# Patient Record
Sex: Female | Born: 1960
Health system: Southern US, Community
[De-identification: ages and names within clinical notes are randomized; demographics above are authoritative.]

## PROBLEM LIST (undated history)

## (undated) DIAGNOSIS — Z9889 Other specified postprocedural states: Secondary | ICD-10-CM

## (undated) DIAGNOSIS — F419 Anxiety disorder, unspecified: Secondary | ICD-10-CM

## (undated) DIAGNOSIS — T8859XA Other complications of anesthesia, initial encounter: Secondary | ICD-10-CM

## (undated) DIAGNOSIS — K589 Irritable bowel syndrome without diarrhea: Secondary | ICD-10-CM

## (undated) DIAGNOSIS — I2510 Atherosclerotic heart disease of native coronary artery without angina pectoris: Secondary | ICD-10-CM

## (undated) DIAGNOSIS — Z87442 Personal history of urinary calculi: Secondary | ICD-10-CM

## (undated) DIAGNOSIS — G4730 Sleep apnea, unspecified: Secondary | ICD-10-CM

## (undated) DIAGNOSIS — F329 Major depressive disorder, single episode, unspecified: Secondary | ICD-10-CM

## (undated) DIAGNOSIS — F32A Depression, unspecified: Secondary | ICD-10-CM

## (undated) DIAGNOSIS — I10 Essential (primary) hypertension: Secondary | ICD-10-CM

## (undated) DIAGNOSIS — K219 Gastro-esophageal reflux disease without esophagitis: Secondary | ICD-10-CM

## (undated) DIAGNOSIS — J189 Pneumonia, unspecified organism: Secondary | ICD-10-CM

## (undated) DIAGNOSIS — M1990 Unspecified osteoarthritis, unspecified site: Secondary | ICD-10-CM

## (undated) DIAGNOSIS — M797 Fibromyalgia: Secondary | ICD-10-CM

## (undated) DIAGNOSIS — E039 Hypothyroidism, unspecified: Secondary | ICD-10-CM

## (undated) DIAGNOSIS — K5792 Diverticulitis of intestine, part unspecified, without perforation or abscess without bleeding: Secondary | ICD-10-CM

## (undated) DIAGNOSIS — E079 Disorder of thyroid, unspecified: Secondary | ICD-10-CM

## (undated) DIAGNOSIS — R112 Nausea with vomiting, unspecified: Secondary | ICD-10-CM

## (undated) DIAGNOSIS — J45909 Unspecified asthma, uncomplicated: Secondary | ICD-10-CM

## (undated) DIAGNOSIS — E119 Type 2 diabetes mellitus without complications: Secondary | ICD-10-CM

## (undated) HISTORY — PX: KNEE SURGERY: SHX244

## (undated) HISTORY — PX: CYST REMOVAL HAND: SHX6279

## (undated) HISTORY — PX: CHOLECYSTECTOMY: SHX55

## (undated) HISTORY — PX: BREAST EXCISIONAL BIOPSY: SUR124

## (undated) HISTORY — PX: TUBAL LIGATION: SHX77

## (undated) HISTORY — PX: FRACTURE SURGERY: SHX138

## (undated) HISTORY — PX: CARPAL TUNNEL RELEASE: SHX101

---

## 1998-08-21 ENCOUNTER — Ambulatory Visit (HOSPITAL_BASED_OUTPATIENT_CLINIC_OR_DEPARTMENT_OTHER): Admission: RE | Admit: 1998-08-21 | Discharge: 1998-08-21 | Payer: Self-pay | Admitting: Orthopedic Surgery

## 1998-09-18 ENCOUNTER — Ambulatory Visit (HOSPITAL_BASED_OUTPATIENT_CLINIC_OR_DEPARTMENT_OTHER): Admission: RE | Admit: 1998-09-18 | Discharge: 1998-09-18 | Payer: Self-pay | Admitting: Orthopedic Surgery

## 2008-03-16 ENCOUNTER — Encounter: Admission: RE | Admit: 2008-03-16 | Discharge: 2008-03-16 | Payer: Self-pay | Admitting: Orthopedic Surgery

## 2008-04-16 ENCOUNTER — Ambulatory Visit (HOSPITAL_BASED_OUTPATIENT_CLINIC_OR_DEPARTMENT_OTHER): Admission: RE | Admit: 2008-04-16 | Discharge: 2008-04-16 | Payer: Self-pay | Admitting: Orthopedic Surgery

## 2008-04-16 ENCOUNTER — Encounter (INDEPENDENT_AMBULATORY_CARE_PROVIDER_SITE_OTHER): Payer: Self-pay | Admitting: Orthopedic Surgery

## 2010-03-17 ENCOUNTER — Ambulatory Visit (HOSPITAL_COMMUNITY): Admission: RE | Admit: 2010-03-17 | Discharge: 2010-03-17 | Payer: Self-pay | Admitting: Family Medicine

## 2010-10-20 ENCOUNTER — Ambulatory Visit (HOSPITAL_COMMUNITY)
Admission: RE | Admit: 2010-10-20 | Discharge: 2010-10-20 | Payer: Self-pay | Source: Home / Self Care | Attending: Family Medicine | Admitting: Family Medicine

## 2011-03-29 NOTE — Op Note (Signed)
[name], [name]               ACCOUNT NO.:  [id number]   MEDICAL RECORD NO.:  [id number]          PATIENT TYPE:  AMB   LOCATION:  [facility]                         FACILITY:  [facility]   PHYSICIAN:  Madelynn Done, MD  DATE OF BIRTH:  12-Jul-1961   DATE OF PROCEDURE:  DATE OF DISCHARGE:                               OPERATIVE REPORT   PREOPERATIVE DIAGNOSES:  1. Left wrist first dorsal compartment tenosynovitis.  2. Left dorsal wrist mass.   POSTOPERATIVE DIAGNOSES:  1. Left wrist first dorsal compartment tenosynovitis.  2. Left dorsal wrist mass.   ATTENDING SURGEON:  Sharma Covert IV, MD, who was scrubbed and present  for the entire procedure.   ASSISTANT SURGEON:  None.   PROCEDURE:  1. Left dorsal wrist mass, deep mass removal.  2. Left wrist first dorsal compartment tenosynovectomy.   ANESTHESIA:  General via LMA.   TOURNIQUET TIME:  Less than 12 minutes at 250 mmHg.   INTRAOPERATIVE FINDINGS:  The patient did have acute tenosynovitis  within the first dorsal compartment, and multiple slips of the APL and a  separate compartment for the EPB.   The patient also did have a dorsal mass consistent with an  intratendinous ganglion out to the level of the first dorsal  compartment.   SURGICAL INDICATIONS:  Kristin Bailey is a 50 year old female who had  persistent radial-sided left wrist pain.  The patient had a mass,  persistent pain, and has elected to proceed with the above procedure.  Risks, benefits and alternatives were discussed in detail with the  patient.  A signed informed consent was obtained on the day of  procedure.  Risks include, but not limited to bleeding, infection, nerve  damage, partial or permanent persistent symptoms, subluxation with  tendinis damage to the nearby nerves, arteries or tendons, recurrence of  the mass and need for further surgical intervention.   DESCRIPTION OF PROCEDURE:  The patient was properly identified in the  preoperative  holding area and marked with a permanent mark made on the  left wrist.  The patient then brought back to the operating room, placed  supine on the anesthesia table.  General anesthesia was administered via  LMA.  Please note, Dr. Lequita Halt was also performing a knee scope at the  concurrent time of my procedure.  A separate operative note will be  dictated by him.   A well-padded tourniquet was then placed on the left forearm and sealed  with a 1000 drape.  The left upper extremity was then prepped with  DuraPrep and then sterilely draped.  A time-out was then called.  The  correct time was identified and the procedure was then begun.  The limb  was then elevated using Esmarch exsanguination and the tourniquet  insufflated to 250 mmHg.  A longitudinal incision was then made directly  in line with the first dorsal compartment.  Dissection was carried down  through the skin and subcutaneous tissues only.  The branches of the  superficial branch radial nerve were then identified and retracted  separately throughout the entire case.  The dorsal  mass directly over  the first dorsal compartment was then isolated and consistent with a  ganglion within the tendon sheath.  The mass was then excised sharply.  The first dorsal compartment was then released both proximally and  distally under direct visualization.  A tenosynovectomy was then carried  out of the EPB and a separate compartment and the multiple slip of the  APL.  Following the tenosynovectomy the area was then thoroughly  irrigated.  The skin was then closed using horizontal mattress of 4-0  nylon sutures.  Adaptic dressing and a sterile compressive bandage was  then applied.  The patient was then placed in a well-padded thumb spica  dressing.  The patient tolerated the procedure well.  She was extubated  following her knee scope procedure.   POSTOPERATIVE PLAN:  The patient will be discharged to home.  She will  be seen back in the  office in 10 days for wound check and suture  removal, and then begin a postoperative rehabilitation 3-course.      Madelynn Done, MD  Electronically Signed     FWO/MEDQ  D:  04/16/2008  T:  04/16/2008  Job:  [id number]

## 2011-03-29 NOTE — Op Note (Signed)
[name], [name]               ACCOUNT NO.:  [id number]   MEDICAL RECORD NO.:  [id number]          PATIENT TYPE:  AMB   LOCATION:  [facility]                         FACILITY:  [facility]   PHYSICIAN:  Ollen Gross, M.D.    DATE OF BIRTH:  December 30, 1960   DATE OF PROCEDURE:  04/16/2008  DATE OF DISCHARGE:                               OPERATIVE REPORT   PREOPERATIVE DIAGNOSIS:  Left knee medial meniscal tear.   POSTOPERATIVE DIAGNOSIS:  Left knee medial meniscal tear plus chondral  defect medially.   PROCEDURE:  Left knee arthroscopy with medial meniscal debridement and  chondroplasty.   SURGEON:  Ollen Gross, M.D.   ASSISTANT:  None.   ANESTHESIA:  General.   ESTIMATED BLOOD LOSS:  Minimal.   DRAIN:  None.   COMPLICATIONS:  None.   CONDITION.:  Stable to recovery.   CLINICAL NOTE:  Kristin Bailey is a 50 year old female with significant left  knee pain and mechanical symptoms for many months now.  The pain has  gotten progressively worse.  She was seen and had an MRI, which showed a  meniscal tear.  She had mild degenerative change also.  Due to her pain  and mechanical symptoms, she presents now for arthroscopic management.   PROCEDURE IN DETAIL:  After the successful administration of general  anesthetic, a tourniquet was placed around her left thigh and left lower  extremity prepped and draped in the usual sterile fashion.  Standard  superomedial and inferolateral incisions were made.  Inflow cannula  passed superomedial and camera passed inferolateral.  Arthroscopic  visualization proceeds.  The under surface of the patella shows some  grade 1 chondromalacia, masked as the trochlea.  There were no full-  thickness cartilage defects.  The medial and lateral gutters were  visualized.  There were no loose bodies.  There was a fair amount of  hypertrophic synovium present in the suprapatellar area.  A flexion  valgus force is applied to the knee and the medial compartment is  entered.  She has some grade 2 and 3 chondromalacia throughout most of  the medial femoral condyle, but no full-thickness defects.  There is a  degenerative tear at the body and posterior horn of the medial meniscus.  This was debrided back to a stable base with baskets and a 4.2 mm  shaver.  It is sealed off with the ArthroCare device.  I debrided the  unstable cartilage on the surface of the medial femoral condyle, with  the shaver down to a stable cartilaginous base.  We did not have any  exposed bone present.  The medial tibial plateau looked fairly normal.  Intercondylar notch was visualized.  The ACL was normal.  Lateral  compartment was entered and it looks normal.  We then used the  ArthroCare device to debride some of the hypertrophic synovium,  especially in the suprapatellar area.  The infrapatella also had some  tissues debrided.  I then removed the arthroscopic equipment from the  inferior portals,  which were closed with interrupted 4-0 nylon.  Twenty mL of 0.25%  Marcaine with epinephrine injected through  the inflow cannula, and then  that is removed, and that portal closed with nylon.  A bulky, sterile  dressing is applied, and she is awakened and transported to recovery in  stable condition.      Ollen Gross, M.D.  Electronically Signed     FA/MEDQ  D:  04/16/2008  T:  04/16/2008  Job:  [id number]

## 2011-05-16 ENCOUNTER — Other Ambulatory Visit (HOSPITAL_COMMUNITY): Payer: Self-pay | Admitting: Family Medicine

## 2011-05-16 DIAGNOSIS — Z09 Encounter for follow-up examination after completed treatment for conditions other than malignant neoplasm: Secondary | ICD-10-CM

## 2011-06-01 ENCOUNTER — Ambulatory Visit (HOSPITAL_COMMUNITY)
Admission: RE | Admit: 2011-06-01 | Discharge: 2011-06-01 | Disposition: A | Discharge status: Home / Self Care | Payer: Self-pay | Source: Ambulatory Visit | Attending: Family Medicine | Admitting: Family Medicine

## 2011-06-01 DIAGNOSIS — Z09 Encounter for follow-up examination after completed treatment for conditions other than malignant neoplasm: Secondary | ICD-10-CM

## 2011-08-11 LAB — POCT I-STAT, CHEM 8
BUN: 8
Calcium, Ion: 1.14
Chloride: 102
Creatinine, Ser: 0.5
Glucose, Bld: 121 — ABNORMAL HIGH
HCT: 47 — ABNORMAL HIGH
Hemoglobin: 16 — ABNORMAL HIGH
Potassium: 4.1
Sodium: 140
TCO2: 27

## 2012-10-02 ENCOUNTER — Other Ambulatory Visit (HOSPITAL_COMMUNITY): Payer: Self-pay | Admitting: Nurse Practitioner

## 2012-10-02 DIAGNOSIS — R921 Mammographic calcification found on diagnostic imaging of breast: Secondary | ICD-10-CM

## 2012-10-17 ENCOUNTER — Other Ambulatory Visit (HOSPITAL_COMMUNITY): Payer: Self-pay | Admitting: Nurse Practitioner

## 2012-10-17 ENCOUNTER — Ambulatory Visit (HOSPITAL_COMMUNITY)
Admission: RE | Admit: 2012-10-17 | Discharge: 2012-10-17 | Disposition: A | Discharge status: Home / Self Care | Payer: PRIVATE HEALTH INSURANCE | Source: Ambulatory Visit | Attending: Nurse Practitioner | Admitting: Nurse Practitioner

## 2012-10-17 DIAGNOSIS — R921 Mammographic calcification found on diagnostic imaging of breast: Secondary | ICD-10-CM

## 2012-10-17 DIAGNOSIS — R928 Other abnormal and inconclusive findings on diagnostic imaging of breast: Secondary | ICD-10-CM | POA: Insufficient documentation

## 2012-10-19 ENCOUNTER — Other Ambulatory Visit: Payer: Self-pay | Admitting: *Deleted

## 2012-10-19 DIAGNOSIS — R921 Mammographic calcification found on diagnostic imaging of breast: Secondary | ICD-10-CM

## 2012-11-01 ENCOUNTER — Ambulatory Visit
Admission: RE | Admit: 2012-11-01 | Discharge: 2012-11-01 | Disposition: A | Discharge status: Home / Self Care | Payer: No Typology Code available for payment source | Source: Ambulatory Visit | Attending: *Deleted | Admitting: *Deleted

## 2012-11-01 ENCOUNTER — Other Ambulatory Visit: Payer: Self-pay | Admitting: *Deleted

## 2012-11-01 DIAGNOSIS — R921 Mammographic calcification found on diagnostic imaging of breast: Secondary | ICD-10-CM

## 2013-10-17 ENCOUNTER — Other Ambulatory Visit (HOSPITAL_COMMUNITY): Payer: Self-pay | Admitting: *Deleted

## 2013-10-17 ENCOUNTER — Ambulatory Visit (HOSPITAL_COMMUNITY)
Admission: RE | Admit: 2013-10-17 | Discharge: 2013-10-17 | Disposition: A | Discharge status: Home / Self Care | Payer: Disability Insurance | Source: Ambulatory Visit | Attending: Family Medicine | Admitting: Family Medicine

## 2013-10-17 DIAGNOSIS — M25569 Pain in unspecified knee: Secondary | ICD-10-CM | POA: Insufficient documentation

## 2013-10-17 DIAGNOSIS — M25551 Pain in right hip: Secondary | ICD-10-CM

## 2013-10-17 DIAGNOSIS — M25559 Pain in unspecified hip: Secondary | ICD-10-CM | POA: Insufficient documentation

## 2013-10-17 DIAGNOSIS — M25562 Pain in left knee: Secondary | ICD-10-CM

## 2013-10-17 DIAGNOSIS — M25561 Pain in right knee: Secondary | ICD-10-CM

## 2013-11-30 ENCOUNTER — Emergency Department (HOSPITAL_COMMUNITY)
Admission: EM | Admit: 2013-11-30 | Discharge: 2013-11-30 | Disposition: A | Discharge status: Home / Self Care | Payer: Self-pay | Attending: Emergency Medicine | Admitting: Emergency Medicine

## 2013-11-30 ENCOUNTER — Emergency Department (HOSPITAL_COMMUNITY): Payer: Self-pay

## 2013-11-30 ENCOUNTER — Encounter (HOSPITAL_COMMUNITY): Payer: Self-pay | Admitting: Emergency Medicine

## 2013-11-30 DIAGNOSIS — E119 Type 2 diabetes mellitus without complications: Secondary | ICD-10-CM | POA: Insufficient documentation

## 2013-11-30 DIAGNOSIS — J189 Pneumonia, unspecified organism: Secondary | ICD-10-CM

## 2013-11-30 DIAGNOSIS — Z8719 Personal history of other diseases of the digestive system: Secondary | ICD-10-CM | POA: Insufficient documentation

## 2013-11-30 DIAGNOSIS — J159 Unspecified bacterial pneumonia: Secondary | ICD-10-CM | POA: Insufficient documentation

## 2013-11-30 DIAGNOSIS — I10 Essential (primary) hypertension: Secondary | ICD-10-CM | POA: Insufficient documentation

## 2013-11-30 DIAGNOSIS — Z8659 Personal history of other mental and behavioral disorders: Secondary | ICD-10-CM | POA: Insufficient documentation

## 2013-11-30 HISTORY — DX: Irritable bowel syndrome, unspecified: K58.9

## 2013-11-30 HISTORY — DX: Essential (primary) hypertension: I10

## 2013-11-30 HISTORY — DX: Disorder of thyroid, unspecified: E07.9

## 2013-11-30 HISTORY — DX: Type 2 diabetes mellitus without complications: E11.9

## 2013-11-30 LAB — CBC WITH DIFFERENTIAL/PLATELET
BASOS ABS: 0 10*3/uL (ref 0.0–0.1)
BASOS PCT: 0 % (ref 0–1)
Eosinophils Absolute: 0 10*3/uL (ref 0.0–0.7)
Eosinophils Relative: 0 % (ref 0–5)
HEMATOCRIT: 38.6 % (ref 36.0–46.0)
Hemoglobin: 13 g/dL (ref 12.0–15.0)
LYMPHS ABS: 2.1 10*3/uL (ref 0.7–4.0)
LYMPHS PCT: 14 % (ref 12–46)
MCH: 25.8 pg — AB (ref 26.0–34.0)
MCHC: 33.7 g/dL (ref 30.0–36.0)
MCV: 76.7 fL — ABNORMAL LOW (ref 78.0–100.0)
Monocytes Absolute: 1.3 10*3/uL — ABNORMAL HIGH (ref 0.1–1.0)
Monocytes Relative: 9 % (ref 3–12)
NEUTROS PCT: 77 % (ref 43–77)
Neutro Abs: 11.4 10*3/uL — ABNORMAL HIGH (ref 1.7–7.7)
PLATELETS: 304 10*3/uL (ref 150–400)
RBC: 5.03 MIL/uL (ref 3.87–5.11)
RDW: 15.8 % — AB (ref 11.5–15.5)
WBC: 14.9 10*3/uL — AB (ref 4.0–10.5)

## 2013-11-30 LAB — BASIC METABOLIC PANEL
BUN: 12 mg/dL (ref 6–23)
CO2: 25 meq/L (ref 19–32)
Calcium: 9.1 mg/dL (ref 8.4–10.5)
Chloride: 94 mEq/L — ABNORMAL LOW (ref 96–112)
Creatinine, Ser: 0.46 mg/dL — ABNORMAL LOW (ref 0.50–1.10)
GFR calc Af Amer: >90 mL/min (ref >90)
GLUCOSE: 127 mg/dL — AB (ref 70–99)
POTASSIUM: 3.5 meq/L — AB (ref 3.7–5.3)
SODIUM: 135 meq/L — AB (ref 137–147)

## 2013-11-30 MED ORDER — CEFTRIAXONE SODIUM 1 G IJ SOLR
1.0000 g | Freq: Once | INTRAMUSCULAR | Status: AC
  Administered 2013-11-30: 1 g via INTRAMUSCULAR
  Filled 2013-11-30: qty 10

## 2013-11-30 MED ORDER — AZITHROMYCIN 250 MG PO TABS
500.0000 mg | ORAL_TABLET | Freq: Once | ORAL | Status: AC
  Administered 2013-11-30: 500 mg via ORAL
  Filled 2013-11-30: qty 2

## 2013-11-30 MED ORDER — HYDROCODONE-HOMATROPINE 5-1.5 MG/5ML PO SYRP: 5.0000 mL | ORAL_SOLUTION | Freq: Four times a day (QID) | ORAL | Status: DC | PRN

## 2013-11-30 MED ORDER — IOHEXOL 350 MG/ML SOLN
100.0000 mL | Freq: Once | INTRAVENOUS | Status: AC | PRN
  Administered 2013-11-30: 100 mL via INTRAVENOUS

## 2013-11-30 MED ORDER — LIDOCAINE HCL (PF) 1 % IJ SOLN
INTRAMUSCULAR | Status: AC
  Administered 2013-11-30: 2.1 mL
  Filled 2013-11-30: qty 5

## 2013-11-30 MED ORDER — AZITHROMYCIN 250 MG PO TABS: ORAL_TABLET | ORAL | Status: DC

## 2013-11-30 MED ORDER — ALBUTEROL SULFATE HFA 108 (90 BASE) MCG/ACT IN AERS
2.0000 | INHALATION_SPRAY | Freq: Once | RESPIRATORY_TRACT | Status: DC
  Filled 2013-11-30: qty 6.7

## 2013-11-30 NOTE — ED Provider Notes (Signed)
Medical screening examination/treatment/procedure(s) were performed by non-physician practitioner and as supervising physician I was immediately available for consultation/collaboration.  EKG Interpretation   None        Nat Christen, MD 11/30/13 1547

## 2013-11-30 NOTE — ED Notes (Signed)
Pt used personal inhaler. Given INH and aerochamber, pt verbalized understanding.

## 2013-11-30 NOTE — Discharge Instructions (Signed)

## 2013-11-30 NOTE — ED Notes (Signed)
Pt c/o cough, body aches, headache and malaise. Pt reports she coughs until she vomits.

## 2013-11-30 NOTE — ED Provider Notes (Signed)
CSN: [id number]     Arrival date & time 11/30/13  1222 History   None    Chief Complaint  Patient presents with  . Cough   (Consider location/radiation/quality/duration/timing/severity/associated sxs/prior Treatment) Patient is a 53 y.o. female presenting with cough. The history is provided by the patient. No language interpreter was used.  Cough Cough characteristics:  Productive Sputum characteristics:  Nondescript Severity:  Severe Onset quality:  Gradual Duration:  3 weeks Timing:  Constant Progression:  Worsening Chronicity:  New Smoker: no   Context: sick contacts   Relieved by:  Beta-agonist inhaler Ineffective treatments:  None tried Associated symptoms: shortness of breath     Past Medical History  Diagnosis Date  . Hypertension   . Thyroid disease   . Diabetes mellitus without complication   . Anxiety attack   . IBS (irritable bowel syndrome)    Past Surgical History  Procedure Laterality Date  . Cesarean section    . Tubal ligation    . Cholecystectomy    . Fracture surgery    . Carpal tunnel release    . Cyst removal hand    . Knee surgery     Family History  Problem Relation Age of Onset  . Cancer Mother   . Hypertension Mother   . Stroke Mother   . Diabetes Mother   . Cancer Father   . Hypertension Father   . Diabetes Father    History  Substance Use Topics  . Smoking status: Never Smoker   . Smokeless tobacco: Never Used  . Alcohol Use: No   OB History   Grav Para Term Preterm Abortions TAB SAB Ect Mult Living   1 1 1             Review of Systems  Respiratory: Positive for cough and shortness of breath.   All other systems reviewed and are negative.    Allergies  Review of patient's allergies indicates no known allergies.  Home Medications  No current outpatient prescriptions on file. BP 152/64  Pulse 105  Temp(Src) 99 F (37.2 C) (Oral)  Resp 20  Ht 5\' 6"  (1.676 m)  Wt 259 lb (117.482 kg)  BMI 41.82 kg/m2  SpO2 98%   LMP 11/23/2013 Physical Exam  Nursing note and vitals reviewed. Constitutional: She is oriented to person, place, and time. She appears well-developed and well-nourished.  HENT:  Head: Normocephalic.  Right Ear: External ear normal.  Left Ear: External ear normal.  Nose: Nose normal.  Mouth/Throat: Oropharynx is clear and moist.  Eyes: Conjunctivae and EOM are normal. Pupils are equal, round, and reactive to light.  Neck: Normal range of motion. Neck supple.  Cardiovascular: Normal rate, regular rhythm and normal heart sounds.   Pulmonary/Chest: Effort normal and breath sounds normal.  Abdominal: Soft. Bowel sounds are normal.  Musculoskeletal: Normal range of motion.  Neurological: She is alert and oriented to person, place, and time. She has normal reflexes.  Skin: Skin is warm.  Psychiatric: She has a normal mood and affect.    ED Course  Procedures (including critical care time) Labs Review Labs Reviewed - No data to display Imaging Review Dg Chest 2 View  11/30/2013   CLINICAL DATA:  Cough, congestion and weakness.  Dizziness.  EXAM: CHEST  2 VIEW  COMPARISON:  None.  FINDINGS: New wedge-shaped opacity in the superior segment of the left lower lobe. This is somewhat masslike in appearance. Lungs are otherwise clear. No pleural effusions. No evidence of pulmonary edema.  Heart size is normal. Upper mediastinal contours are within normal limits.  IMPRESSION: 1. New somewhat wedge-shaped mass like opacity in the superior segment of the left lower lobe. In the appropriate clinical setting, this could represent either a focus of pneumonia or hemorrhage from pulmonary infarction. Alternatively, the possibility of an underlying mass or endobronchial lesion with postobstructive changes is not excluded. Clinical correlation is recommended, with consideration for further evaluation contrast enhanced chest CT (PE protocol CT would be preferable) if clinically appropriate.   Electronically Signed    By: Vinnie Langton M.D.   On: 11/30/2013 13:16    EKG Interpretation   None       MDM   1. Community acquired pneumonia   Chest xray shows pneumonia vs mass vs pulmonary hemmorhage.   Radiologist advised ct scan.   Pt counseled,   Ct obtained which shows pneumonia,  No mass.   Pt given Rocephin Im,   Zithromax po.   Albuterol inhaler given here.   Pt given Rx for hydromet for cough.      [place], PA-C 11/30/13 1530

## 2014-05-30 ENCOUNTER — Encounter: Payer: Self-pay | Admitting: Obstetrics and Gynecology

## 2014-05-30 ENCOUNTER — Encounter (INDEPENDENT_AMBULATORY_CARE_PROVIDER_SITE_OTHER): Payer: Self-pay

## 2014-05-30 ENCOUNTER — Ambulatory Visit (INDEPENDENT_AMBULATORY_CARE_PROVIDER_SITE_OTHER): Payer: BC Managed Care – PPO | Admitting: Obstetrics and Gynecology

## 2014-05-30 ENCOUNTER — Other Ambulatory Visit (HOSPITAL_COMMUNITY)
Admission: RE | Admit: 2014-05-30 | Discharge: 2014-05-30 | Disposition: A | Discharge status: Home / Self Care | Payer: BC Managed Care – PPO | Source: Ambulatory Visit | Attending: Obstetrics and Gynecology | Admitting: Obstetrics and Gynecology

## 2014-05-30 VITALS — BP 120/76 | Ht 66.0 in | Wt 264.0 lb

## 2014-05-30 DIAGNOSIS — Z01419 Encounter for gynecological examination (general) (routine) without abnormal findings: Secondary | ICD-10-CM | POA: Insufficient documentation

## 2014-05-30 DIAGNOSIS — N814 Uterovaginal prolapse, unspecified: Secondary | ICD-10-CM

## 2014-05-30 DIAGNOSIS — N951 Menopausal and female climacteric states: Secondary | ICD-10-CM

## 2014-05-30 DIAGNOSIS — Z1151 Encounter for screening for human papillomavirus (HPV): Secondary | ICD-10-CM | POA: Insufficient documentation

## 2014-05-30 DIAGNOSIS — Z1212 Encounter for screening for malignant neoplasm of rectum: Secondary | ICD-10-CM

## 2014-05-30 LAB — HEMOCCULT GUIAC POC 1CARD (OFFICE): FECAL OCCULT BLD: NEGATIVE

## 2014-05-30 MED ORDER — MEDROXYPROGESTERONE ACETATE 10 MG PO TABS: 10.0000 mg | ORAL_TABLET | Freq: Every day | ORAL | Status: DC

## 2014-05-30 NOTE — Progress Notes (Signed)
Patient ID: Kristin Bailey, female   DOB: 02/16/1961, 53 y.o.   MRN: [id number]  This chart was scribed by Erling Conte, Medical Scribe, for Dr. Mallory Shirk on 05/30/14 at 12:44 PM. This chart was reviewed by Dr. Mallory Shirk for accuracy.    Assessment:  1. Annual Gyn Exam 2. Pelvic relaxation mild and ovulation perimenopausal. 3. 1st degree uterine descensus 4.  Pro-Vera, 10 mg 3x daily   Plan:  1. pap smear done, next pap due 1 year 2. return annually or prn 3    Annual mammogram advised  Subjective:  Kristin Bailey is a 53 y.o. female G1P1000 who presents for annual exam. Patient's last menstrual period was 03/27/2014.  Pt here today for annual exam. Pt states that she has been having irregular periods (every other month) for about a year. Pt states that recently she has had some lower abdominal pain and pain inside, had the pain over the weekend. Pt wants to discuss these issues and she states that she can't have an orgasm, and she doesn't have the desire like she used to. She is also having some complaints of mild urinary incontinence.  Had previous biopsy of right breast  The following portions of the patient's history were reviewed and updated as appropriate: allergies, current medications, past family history, past medical history, past social history, past surgical history and problem list. Past Medical History  Diagnosis Date  . Hypertension   . Thyroid disease   . Diabetes mellitus without complication   . Anxiety attack   . IBS (irritable bowel syndrome)     Past Surgical History  Procedure Laterality Date  . Cesarean section    . Tubal ligation    . Cholecystectomy    . Fracture surgery    . Carpal tunnel release    . Cyst removal hand    . Knee surgery      Current outpatient prescriptions:acetaminophen (TYLENOL) 650 MG CR tablet, Take 1,300 mg by mouth 2 (two) times daily as needed for pain., Disp: , Rfl: ;  albuterol (PROVENTIL HFA;VENTOLIN HFA) 108 (90  BASE) MCG/ACT inhaler, Inhale 2 puffs into the lungs every 4 (four) hours as needed for wheezing or shortness of breath., Disp: , Rfl:  ALPRAZolam (XANAX) 0.25 MG tablet, Take 0.25 mg by mouth 2 (two) times daily as needed for anxiety., Disp: , Rfl: ;  aspirin EC 81 MG tablet, Take 81 mg by mouth daily., Disp: , Rfl: ;  DULoxetine (CYMBALTA) 60 MG capsule, Take 60 mg by mouth daily., Disp: , Rfl: ;  Echinacea 380 MG CAPS, Take 2 capsules by mouth daily., Disp: , Rfl: ;  fluticasone (FLONASE) 50 MCG/ACT nasal spray, Place 2 sprays into both nostrils daily., Disp: , Rfl:  glipiZIDE (GLUCOTROL) 5 MG tablet, Take 2.5 mg by mouth 2 (two) times daily., Disp: , Rfl: ;  levothyroxine (SYNTHROID, LEVOTHROID) 88 MCG tablet, Take 88 mcg by mouth daily before breakfast., Disp: , Rfl: ;  lisinopril-hydrochlorothiazide (PRINZIDE,ZESTORETIC) 20-12.5 MG per tablet, Take 1 tablet by mouth daily., Disp: , Rfl: ;  loperamide (IMODIUM) 2 MG capsule, Take 4 mg by mouth as needed for diarrhea or loose stools., Disp: , Rfl:  loratadine (CLARITIN) 10 MG tablet, Take 10 mg by mouth daily., Disp: , Rfl: ;  metFORMIN (GLUCOPHAGE) 1000 MG tablet, Take 1,000 mg by mouth 2 (two) times daily with a meal., Disp: , Rfl: ;  omeprazole (PRILOSEC OTC) 20 MG tablet, Take 20 mg by mouth daily., Disp: , Rfl: ;  Probiotic Product (PROBIOTIC DAILY PO), Take 1 capsule by mouth daily., Disp: , Rfl:  vitamin B-12 (CYANOCOBALAMIN) 1000 MCG tablet, Take 1,000 mcg by mouth daily., Disp: , Rfl: ;  vitamin C (ASCORBIC ACID) 500 MG tablet, Take 500 mg by mouth 2 (two) times daily., Disp: , Rfl: ;  zinc gluconate 50 MG tablet, Take 50 mg by mouth daily., Disp: , Rfl:   Review of Systems +lower abdominal pain +decrease in sex drive and ability to orgasm  +Mild urinary incontinence No other complaints  Objective:  BP 120/76  Ht 5\' 6"  (1.676 m)  Wt 264 lb (119.75 kg)  BMI 42.63 kg/m2  LMP 03/27/2014   BMI: Body mass index is 42.63 kg/(m^2).  General  Appearance: Alert, appropriate appearance for age. No acute distress HEENT: Grossly normal Neck / Thyroid:  Cardiovascular: RRR; normal S1, S2, no murmur Lungs: CTA bilaterally Back: No CVAT Breast Exam: No dimpling, nipple retraction or discharge. No masses or nodes. and No masses or nodes.No dimpling, nipple retraction or discharge. Gastrointestinal: Soft, non-tender, no masses or organomegaly Pelvic Exam: Vulva and vagina appear normal. Bimanual exam reveals normal uterus and adnexa. 1st degree uterine descensus. Moderately tender non perilent. No sign of infection. Clear cervical mucus.  Rectovaginal: not indicated Lymphatic Exam: Non-palpable nodes in neck, clavicular, axillary, or inguinal regions Skin: no rash or abnormalities Neurologic: Normal gait and speech, no tremor  Psychiatric: Alert and oriented, appropriate affect.  Examination chaperoned by Clovis Riley. MD Pgr [phone] 12:43 PM

## 2014-06-02 LAB — CYTOLOGY - PAP

## 2014-06-26 ENCOUNTER — Encounter (INDEPENDENT_AMBULATORY_CARE_PROVIDER_SITE_OTHER): Payer: Self-pay | Admitting: *Deleted

## 2014-07-28 ENCOUNTER — Ambulatory Visit (INDEPENDENT_AMBULATORY_CARE_PROVIDER_SITE_OTHER): Payer: BC Managed Care – PPO | Admitting: Internal Medicine

## 2014-08-04 ENCOUNTER — Encounter (INDEPENDENT_AMBULATORY_CARE_PROVIDER_SITE_OTHER): Payer: Self-pay | Admitting: Internal Medicine

## 2014-08-04 ENCOUNTER — Ambulatory Visit (INDEPENDENT_AMBULATORY_CARE_PROVIDER_SITE_OTHER): Payer: BC Managed Care – PPO | Admitting: Internal Medicine

## 2014-08-04 ENCOUNTER — Other Ambulatory Visit (INDEPENDENT_AMBULATORY_CARE_PROVIDER_SITE_OTHER): Payer: Self-pay | Admitting: *Deleted

## 2014-08-04 ENCOUNTER — Telehealth (INDEPENDENT_AMBULATORY_CARE_PROVIDER_SITE_OTHER): Payer: Self-pay | Admitting: *Deleted

## 2014-08-04 VITALS — BP 160/82 | HR 76 | Temp 98.0°F | Ht 66.0 in | Wt 263.0 lb

## 2014-08-04 DIAGNOSIS — Z800 Family history of malignant neoplasm of digestive organs: Secondary | ICD-10-CM

## 2014-08-04 DIAGNOSIS — K625 Hemorrhage of anus and rectum: Secondary | ICD-10-CM

## 2014-08-04 DIAGNOSIS — Z86010 Personal history of colonic polyps: Secondary | ICD-10-CM

## 2014-08-04 DIAGNOSIS — Z1211 Encounter for screening for malignant neoplasm of colon: Secondary | ICD-10-CM

## 2014-08-04 MED ORDER — PEG-KCL-NACL-NASULF-NA ASC-C 100 G PO SOLR: 1.0000 | Freq: Once | ORAL | Status: DC

## 2014-08-04 NOTE — Progress Notes (Signed)
Subjective:    Patient ID: Kristin Bailey, female    DOB: August 09, 1961, 53 y.o.   MRN: [id number]  HPI Referred to our office by Dr. Raliegh Ip. Howard for rectal bleeding. Noticed BRRB in the toilet and on the toilet tissue. The rectal bleeding a month ago. She had one episode x 2 days. She says it was not a large amt. She says it was hemorrhoids.  She was examined and she was guaiac positive. Appetite is good.  No unintentional weight loss. Acid reflux controlled with Omeprazole. No dysphagia. No abdominal pain today. She does have occasionally crampy, lower abdominal pain. She usually has 3 stools a day. Sometimes she will have 5-6. No recent BRRB or melena.  Her last colonoscopy was 6 year ago. She reports it was normal. Does have hx of colon polyps. Hx of fatty liver. Family hx of colon cancer: Mother     Review of Systems Past Medical History  Diagnosis Date  . Hypertension   . Thyroid disease   . Diabetes mellitus without complication   . Anxiety attack   . IBS (irritable bowel syndrome)     Past Surgical History  Procedure Laterality Date  . Cesarean section    . Tubal ligation    . Cholecystectomy    . Fracture surgery      rt ankle   . Carpal tunnel release    . Cyst removal hand    . Knee surgery      No Known Allergies  Current Outpatient Prescriptions on File Prior to Visit  Medication Sig Dispense Refill  . acetaminophen (TYLENOL) 650 MG CR tablet Take 1,300 mg by mouth 2 (two) times daily as needed for pain.      Marland Kitchen albuterol (PROVENTIL HFA;VENTOLIN HFA) 108 (90 BASE) MCG/ACT inhaler Inhale 2 puffs into the lungs every 4 (four) hours as needed for wheezing or shortness of breath.      . ALPRAZolam (XANAX) 0.25 MG tablet Take 0.25 mg by mouth 2 (two) times daily as needed for anxiety.      Marland Kitchen aspirin EC 81 MG tablet Take 81 mg by mouth daily.      . DULoxetine (CYMBALTA) 60 MG capsule Take 60 mg by mouth daily.      . Echinacea 380 MG CAPS Take 2 capsules by mouth  daily.      . fluticasone (FLONASE) 50 MCG/ACT nasal spray Place 2 sprays into both nostrils daily.      Marland Kitchen glipiZIDE (GLUCOTROL) 5 MG tablet Take 2.5 mg by mouth 2 (two) times daily.      Marland Kitchen levothyroxine (SYNTHROID, LEVOTHROID) 88 MCG tablet Take 88 mcg by mouth daily before breakfast.      . lisinopril-hydrochlorothiazide (PRINZIDE,ZESTORETIC) 20-12.5 MG per tablet Take 1 tablet by mouth daily.      Marland Kitchen loperamide (IMODIUM) 2 MG capsule Take 4 mg by mouth as needed for diarrhea or loose stools.      Marland Kitchen loratadine (CLARITIN) 10 MG tablet Take 10 mg by mouth daily.      . metFORMIN (GLUCOPHAGE) 1000 MG tablet Take 1,000 mg by mouth 2 (two) times daily with a meal.      . omeprazole (PRILOSEC OTC) 20 MG tablet Take 20 mg by mouth daily.      . Probiotic Product (PROBIOTIC DAILY PO) Take 1 capsule by mouth daily.      . vitamin B-12 (CYANOCOBALAMIN) 1000 MCG tablet Take 1,000 mcg by mouth daily.      Marland Kitchen  vitamin C (ASCORBIC ACID) 500 MG tablet Take 500 mg by mouth 2 (two) times daily.      Marland Kitchen zinc gluconate 50 MG tablet Take 50 mg by mouth daily.       No current facility-administered medications on file prior to visit.        Objective:   Physical Exam  Filed Vitals:   08/04/14 1010  BP: 160/82  Pulse: 76  Temp: 98 F (36.7 C)  Height: 5\' 6"  (1.676 m)  Weight: 263 lb (119.296 kg)   Alert and oriented. Skin warm and dry. Oral mucosa is moist.   . Sclera anicteric, conjunctivae is pink. Thyroid not enlarged. No cervical lymphadenopathy. Lungs clear. Heart regular rate and rhythm.  Abdomen is soft. Bowel sounds are positive. No hepatomegaly. No abdominal masses felt. No tenderness.  No edema to lower extremities         Assessment & Plan:  Rectal bleeding. Colonic neoplasm, AVM, polyp, hemorrhoid needs to be ruled out.  The risks and benefits such as perforation, bleeding, and infection were reviewed with the patient and is agreeable.

## 2014-08-04 NOTE — Patient Instructions (Signed)
Colonoscopy.  The risks and benefits such as perforation, bleeding, and infection were reviewed with the patient and is agreeable. 

## 2014-08-04 NOTE — Telephone Encounter (Signed)
Patient needs movi prep 

## 2014-08-18 ENCOUNTER — Encounter (HOSPITAL_COMMUNITY): Payer: Self-pay | Admitting: Pharmacy Technician

## 2014-08-28 ENCOUNTER — Ambulatory Visit (HOSPITAL_COMMUNITY)
Admission: RE | Admit: 2014-08-28 | Discharge: 2014-08-28 | Disposition: A | Discharge status: Home / Self Care | Payer: BC Managed Care – PPO | Source: Ambulatory Visit | Attending: Internal Medicine | Admitting: Internal Medicine

## 2014-08-28 ENCOUNTER — Encounter (HOSPITAL_COMMUNITY)
Admission: RE | Disposition: A | Discharge status: Home / Self Care | Payer: Self-pay | Source: Ambulatory Visit | Attending: Internal Medicine

## 2014-08-28 ENCOUNTER — Encounter (HOSPITAL_COMMUNITY): Payer: Self-pay | Admitting: *Deleted

## 2014-08-28 DIAGNOSIS — Z800 Family history of malignant neoplasm of digestive organs: Secondary | ICD-10-CM | POA: Insufficient documentation

## 2014-08-28 DIAGNOSIS — K625 Hemorrhage of anus and rectum: Secondary | ICD-10-CM

## 2014-08-28 DIAGNOSIS — Z86010 Personal history of colonic polyps: Secondary | ICD-10-CM | POA: Insufficient documentation

## 2014-08-28 DIAGNOSIS — I10 Essential (primary) hypertension: Secondary | ICD-10-CM | POA: Insufficient documentation

## 2014-08-28 DIAGNOSIS — K5730 Diverticulosis of large intestine without perforation or abscess without bleeding: Secondary | ICD-10-CM | POA: Diagnosis not present

## 2014-08-28 DIAGNOSIS — F419 Anxiety disorder, unspecified: Secondary | ICD-10-CM | POA: Insufficient documentation

## 2014-08-28 DIAGNOSIS — E119 Type 2 diabetes mellitus without complications: Secondary | ICD-10-CM | POA: Diagnosis not present

## 2014-08-28 DIAGNOSIS — Z79899 Other long term (current) drug therapy: Secondary | ICD-10-CM | POA: Insufficient documentation

## 2014-08-28 DIAGNOSIS — K5720 Diverticulitis of large intestine with perforation and abscess without bleeding: Secondary | ICD-10-CM

## 2014-08-28 DIAGNOSIS — K644 Residual hemorrhoidal skin tags: Secondary | ICD-10-CM | POA: Diagnosis not present

## 2014-08-28 DIAGNOSIS — K649 Unspecified hemorrhoids: Secondary | ICD-10-CM

## 2014-08-28 DIAGNOSIS — K921 Melena: Secondary | ICD-10-CM | POA: Insufficient documentation

## 2014-08-28 DIAGNOSIS — Z7982 Long term (current) use of aspirin: Secondary | ICD-10-CM | POA: Insufficient documentation

## 2014-08-28 DIAGNOSIS — E079 Disorder of thyroid, unspecified: Secondary | ICD-10-CM | POA: Insufficient documentation

## 2014-08-28 DIAGNOSIS — K589 Irritable bowel syndrome without diarrhea: Secondary | ICD-10-CM | POA: Insufficient documentation

## 2014-08-28 HISTORY — DX: Other specified postprocedural states: R11.2

## 2014-08-28 HISTORY — DX: Other specified postprocedural states: Z98.890

## 2014-08-28 HISTORY — PX: COLONOSCOPY: SHX5424

## 2014-08-28 LAB — GLUCOSE, CAPILLARY: GLUCOSE-CAPILLARY: 121 mg/dL — AB (ref 70–99)

## 2014-08-28 SURGERY — COLONOSCOPY
Anesthesia: Moderate Sedation

## 2014-08-28 MED ORDER — MIDAZOLAM HCL 5 MG/5ML IJ SOLN
INTRAMUSCULAR | Status: DC | PRN
  Administered 2014-08-28: 2 mg via INTRAVENOUS
  Administered 2014-08-28: 1 mg via INTRAVENOUS
  Administered 2014-08-28: 3 mg via INTRAVENOUS
  Administered 2014-08-28: 2 mg via INTRAVENOUS
  Administered 2014-08-28: 3 mg via INTRAVENOUS
  Administered 2014-08-28 (×2): 2 mg via INTRAVENOUS

## 2014-08-28 MED ORDER — SODIUM CHLORIDE 0.9 % IV SOLN
INTRAVENOUS | Status: DC
  Administered 2014-08-28: 10:00:00 via INTRAVENOUS

## 2014-08-28 MED ORDER — MIDAZOLAM HCL 5 MG/5ML IJ SOLN
INTRAMUSCULAR | Status: AC
  Filled 2014-08-28: qty 5

## 2014-08-28 MED ORDER — MEPERIDINE HCL 50 MG/ML IJ SOLN
INTRAMUSCULAR | Status: AC
  Filled 2014-08-28: qty 1

## 2014-08-28 MED ORDER — STERILE WATER FOR IRRIGATION IR SOLN
Status: DC | PRN
  Administered 2014-08-28: 10:00:00

## 2014-08-28 MED ORDER — MIDAZOLAM HCL 5 MG/5ML IJ SOLN
INTRAMUSCULAR | Status: AC
  Filled 2014-08-28: qty 10

## 2014-08-28 MED ORDER — MEPERIDINE HCL 50 MG/ML IJ SOLN
INTRAMUSCULAR | Status: DC | PRN
  Administered 2014-08-28 (×3): 25 mg via INTRAVENOUS

## 2014-08-28 NOTE — Op Note (Signed)
COLONOSCOPY PROCEDURE REPORT  PATIENT:  Kristin Bailey  MR#:  [id number] Birthdate:  November 01, 1961, 53 y.o., female Endoscopist:  Dr. Rogene Houston, MD Referred By:  Dr. Rory Percy, MD  Procedure Date: 08/28/2014  Procedure:   Colonoscopy  Indications:  Patient is a 53 year old Caucasian female who presents with 2 episodes of hematochezia. She gives history of colonic polyps. Family history significant for colon carcinoma in her mother but she was in her early 100s at the time of diagnosis.  Informed Consent:  The procedure and risks were reviewed with the patient and informed consent was obtained.  Medications:  Demerol 15 mg IV Versed 75 mg IV  Description of procedure:  After a digital rectal exam was performed, that colonoscope was advanced from the anus through the rectum and colon to the area of the cecum, ileocecal valve and appendiceal orifice. The cecum was deeply intubated. These structures were well-seen and photographed for the record. From the level of the cecum and ileocecal valve, the scope was slowly and cautiously withdrawn. The mucosal surfaces were carefully surveyed utilizing scope tip to flexion to facilitate fold flattening as needed. The scope was pulled down into the rectum where a thorough exam including retroflexion was performed.  Findings:   Prep excellent. Moderate number of diverticula noted at sigmoid colon. No evidence of colonic polyps. Normal rectal mucosa. Small hemorrhoids below the dentate line.   Therapeutic/Diagnostic Maneuvers Performed:  None  Complications:  None  Cecal Withdrawal Time:  13 minutes  Impression:  Examination performed to cecum. Moderate sigmoid colon diverticulosis and external hemorrhoids. No evidence of colonic polyps.  Recommendations:  Standard instructions given. High-fiber diet. Will review prior colonoscopy records and determine timing of next colonoscopy.  REHMAN,NAJEEB U  08/28/2014 10:57 AM  CC: Dr.  Rory Percy, MD & Dr. Rayne Du ref. provider found

## 2014-08-28 NOTE — H&P (Signed)
Kristin Bailey is an 53 y.o. female.   Chief Complaint: Patient is here for colonoscopy. HPI: Patient is 53 year old Caucasian female who presents with two episodes of hematochezia. She noted blood with her bowel movement there was fresh and moderate in amount. She has history of IBS and has intermittent diarrhea. She also gives history of colonic polyps discovered on one of her prior colonoscopies. Family history is positive for colon carcinoma in mother she was in her early 74s at the time of diagnosis.  Past Medical History  Diagnosis Date  . Hypertension   . Thyroid disease   . Diabetes mellitus without complication   . Anxiety attack   . IBS (irritable bowel syndrome)   . PONV (postoperative nausea and vomiting)     Past Surgical History  Procedure Laterality Date  . Cesarean section    . Tubal ligation    . Cholecystectomy    . Fracture surgery      rt ankle   . Carpal tunnel release    . Cyst removal hand    . Knee surgery      Family History  Problem Relation Age of Onset  . Cancer Mother     colon cancer age74  . Hypertension Mother   . Stroke Mother   . Diabetes Mother   . Cancer Father   . Hypertension Father   . Diabetes Father   . Kidney disease Paternal Aunt   . Cancer Paternal Aunt   . Kidney disease Paternal Uncle   . Cancer Paternal Uncle   . Stroke Maternal Grandmother    Social History:  reports that she has never smoked. She has never used smokeless tobacco. She reports that she does not drink alcohol or use illicit drugs.  Allergies: No Known Allergies  Medications Prior to Admission  Medication Sig Dispense Refill  . acetaminophen (TYLENOL) 650 MG CR tablet Take 1,300 mg by mouth 2 (two) times daily as needed for pain.      Marland Kitchen albuterol (PROVENTIL HFA;VENTOLIN HFA) 108 (90 BASE) MCG/ACT inhaler Inhale 2 puffs into the lungs every 4 (four) hours as needed for wheezing or shortness of breath.      . ALPRAZolam (XANAX) 0.25 MG tablet Take 0.25 mg  by mouth 2 (two) times daily as needed for anxiety.      . DULoxetine (CYMBALTA) 60 MG capsule Take 60 mg by mouth daily.      Marland Kitchen glipiZIDE (GLUCOTROL) 5 MG tablet Take 2.5 mg by mouth 2 (two) times daily.      . hyoscyamine (LEVSIN, ANASPAZ) 0.125 MG tablet Take 0.125 mg by mouth every 4 (four) hours as needed for cramping.       Marland Kitchen levothyroxine (SYNTHROID, LEVOTHROID) 88 MCG tablet Take 88 mcg by mouth daily before breakfast.      . lisinopril-hydrochlorothiazide (PRINZIDE,ZESTORETIC) 20-12.5 MG per tablet Take 1 tablet by mouth daily.      Marland Kitchen loperamide (IMODIUM) 2 MG capsule Take 4 mg by mouth as needed for diarrhea or loose stools.      Marland Kitchen loratadine (CLARITIN) 10 MG tablet Take 10 mg by mouth daily.      . metFORMIN (GLUCOPHAGE) 1000 MG tablet Take 1,000 mg by mouth 2 (two) times daily with a meal.      . omeprazole (PRILOSEC OTC) 20 MG tablet Take 20 mg by mouth daily.      . peg 3350 powder (MOVIPREP) 100 G SOLR Take 1 kit (200 g total) by mouth once.  1 kit  0  . Probiotic Product (PROBIOTIC DAILY PO) Take 1 capsule by mouth daily.      . vitamin B-12 (CYANOCOBALAMIN) 1000 MCG tablet Take 1,000 mcg by mouth daily.      . vitamin C (ASCORBIC ACID) 500 MG tablet Take 500 mg by mouth 2 (two) times daily.      Marland Kitchen zinc gluconate 50 MG tablet Take 50 mg by mouth daily.      Marland Kitchen aspirin EC 81 MG tablet Take 81 mg by mouth daily.        Results for orders placed during the hospital encounter of 08/28/14 (from the past 48 hour(s))  GLUCOSE, CAPILLARY     Status: Abnormal   Collection Time    08/28/14 10:04 AM      Result Value Ref Range   Glucose-Capillary 121 (*) 70 - 99 mg/dL   No results found.  ROS  Blood pressure 122/67, pulse 79, resp. rate 15, height 5' 6"  (1.676 m), weight 263 lb (119.296 kg), SpO2 99.00%. Physical Exam  Constitutional: She appears well-developed and well-nourished.  HENT:  Mouth/Throat: Oropharynx is clear and moist.  Eyes: Conjunctivae are normal. No scleral  icterus.  Neck: No thyromegaly present.  Cardiovascular: Normal rate, regular rhythm and normal heart sounds.   No murmur heard. Respiratory: Effort normal and breath sounds normal.  GI:  Abdomen is obese but soft and nontender without organomegaly or masses.  Musculoskeletal: She exhibits no edema.  Lymphadenopathy:    She has no cervical adenopathy.  Neurological: She is alert.  Skin: Skin is warm and dry.     Assessment/Plan Rectal bleeding. History of colonic polyps. Family history of colon carcinoma and mother at late onset. Diagnostic colonoscopy.  REHMAN,NAJEEB U 08/28/2014, 10:10 AM

## 2014-08-28 NOTE — Discharge Instructions (Signed)
Resume usual medications and high fiber diet. No driving for 24 hours. Will review prior colonoscopy records and determine timing of next colonoscopy.   Colonoscopy, Care After Refer to this sheet in the next few weeks. These instructions provide you with information on caring for yourself after your procedure. Your health care provider may also give you more specific instructions. Your treatment has been planned according to current medical practices, but problems sometimes occur. Call your health care provider if you have any problems or questions after your procedure. WHAT TO EXPECT AFTER THE PROCEDURE  After your procedure, it is typical to have the following:  A small amount of blood in your stool.  Moderate amounts of gas and mild abdominal cramping or bloating. HOME CARE INSTRUCTIONS  Do not drive, operate machinery, or sign important documents for 24 hours.  You may shower and resume your regular physical activities, but move at a slower pace for the first 24 hours.  Take frequent rest periods for the first 24 hours.  Walk around or put a warm pack on your abdomen to help reduce abdominal cramping and bloating.  Drink enough fluids to keep your urine clear or pale yellow.  You may resume your normal diet as instructed by your health care provider. Avoid heavy or fried foods that are hard to digest.  Avoid drinking alcohol for 24 hours or as instructed by your health care provider.  Only take over-the-counter or prescription medicines as directed by your health care provider.  If a tissue sample (biopsy) was taken during your procedure:  Do not take aspirin or blood thinners for 7 days, or as instructed by your health care provider.  Do not drink alcohol for 7 days, or as instructed by your health care provider.  Eat soft foods for the first 24 hours. SEEK MEDICAL CARE IF: You have persistent spotting of blood in your stool 2-3 days after the procedure. SEEK IMMEDIATE  MEDICAL CARE IF:  You have more than a small spotting of blood in your stool.  You pass large blood clots in your stool.  Your abdomen is swollen (distended).  You have nausea or vomiting.  You have a fever.  You have increasing abdominal pain that is not relieved with medicine. Document Released: 06/14/2004 Document Revised: 08/21/2013 Document Reviewed: 07/08/2013 [facility] Patient Information 2015 [place], [place]. This information is not intended to replace advice given to you by your health care provider. Make sure you discuss any questions you have with your health care provider. High-Fiber Diet Fiber is found in fruits, vegetables, and grains. A high-fiber diet encourages the addition of more whole grains, legumes, fruits, and vegetables in your diet. The recommended amount of fiber for adult males is 38 g per day. For adult females, it is 25 g per day. Pregnant and lactating women should get 28 g of fiber per day. If you have a digestive or bowel problem, ask your caregiver for advice before adding high-fiber foods to your diet. Eat a variety of high-fiber foods instead of only a select few type of foods.  PURPOSE  To increase stool bulk.  To make bowel movements more regular to prevent constipation.  To lower cholesterol.  To prevent overeating. WHEN IS THIS DIET USED?  It may be used if you have constipation and hemorrhoids.  It may be used if you have uncomplicated diverticulosis (intestine condition) and irritable bowel syndrome.  It may be used if you need help with weight management.  It may be  used if you want to add it to your diet as a protective measure against atherosclerosis, diabetes, and cancer. SOURCES OF FIBER  Whole-grain breads and cereals.  Fruits, such as apples, oranges, bananas, berries, prunes, and pears.  Vegetables, such as green peas, carrots, sweet potatoes, beets, broccoli, cabbage, spinach, and artichokes.  Legumes, such split peas, soy,  lentils.  Almonds. FIBER CONTENT IN FOODS Starches and Grains / Dietary Fiber (g)  Cheerios, 1 cup / 3 g  Corn Flakes cereal, 1 cup / 0.7 g  Rice crispy treat cereal, 1 cup / 0.3 g  Instant oatmeal (cooked),  cup / 2 g  Frosted wheat cereal, 1 cup / 5.1 g  Brown, long-grain rice (cooked), 1 cup / 3.5 g  White, long-grain rice (cooked), 1 cup / 0.6 g  Enriched macaroni (cooked), 1 cup / 2.5 g Legumes / Dietary Fiber (g)  Baked beans (canned, plain, or vegetarian),  cup / 5.2 g  Kidney beans (canned),  cup / 6.8 g  Pinto beans (cooked),  cup / 5.5 g Breads and Crackers / Dietary Fiber (g)  Plain or honey graham crackers, 2 squares / 0.7 g  Saltine crackers, 3 squares / 0.3 g  Plain, salted pretzels, 10 pieces / 1.8 g  Whole-wheat bread, 1 slice / 1.9 g  White bread, 1 slice / 0.7 g  Raisin bread, 1 slice / 1.2 g  Plain bagel, 3 oz / 2 g  Flour tortilla, 1 oz / 0.9 g  Corn tortilla, 1 small / 1.5 g  Hamburger or hotdog bun, 1 small / 0.9 g Fruits / Dietary Fiber (g)  Apple with skin, 1 medium / 4.4 g  Sweetened applesauce,  cup / 1.5 g  Banana,  medium / 1.5 g  Grapes, 10 grapes / 0.4 g  Orange, 1 small / 2.3 g  Raisin, 1.5 oz / 1.6 g  Melon, 1 cup / 1.4 g Vegetables / Dietary Fiber (g)  Green beans (canned),  cup / 1.3 g  Carrots (cooked),  cup / 2.3 g  Broccoli (cooked),  cup / 2.8 g  Peas (cooked),  cup / 4.4 g  Mashed potatoes,  cup / 1.6 g  Lettuce, 1 cup / 0.5 g  Corn (canned),  cup / 1.6 g  Tomato,  cup / 1.1 g Document Released: 10/31/2005 Document Revised: 05/01/2012 Document Reviewed: 02/02/2012 [employer] Patient Information 2015 [place], [place]. This information is not intended to replace advice given to you by your health care provider. Make sure you discuss any questions you have with your health care provider. Diverticulosis Diverticulosis is the condition that develops when small pouches (diverticula) form in  the wall of your colon. Your colon, or large intestine, is where water is absorbed and stool is formed. The pouches form when the inside layer of your colon pushes through weak spots in the outer layers of your colon. CAUSES  No one knows exactly what causes diverticulosis. RISK FACTORS  Being older than 41. Your risk for this condition increases with age. Diverticulosis is rare in people younger than 40 years. By age 64, almost everyone has it.  Eating a low-fiber diet.  Being frequently constipated.  Being overweight.  Not getting enough exercise.  Smoking.  Taking over-the-counter pain medicines, like aspirin and ibuprofen. SYMPTOMS  Most people with diverticulosis do not have symptoms. DIAGNOSIS  Because diverticulosis often has no symptoms, health care providers often discover the condition during an exam for other colon problems. In many  cases, a health care provider will diagnose diverticulosis while using a flexible scope to examine the colon (colonoscopy). TREATMENT  If you have never developed an infection related to diverticulosis, you may not need treatment. If you have had an infection before, treatment may include:  Eating more fruits, vegetables, and grains.  Taking a fiber supplement.  Taking a live bacteria supplement (probiotic).  Taking medicine to relax your colon. HOME CARE INSTRUCTIONS   Drink at least 6-8 glasses of water each day to prevent constipation.  Try not to strain when you have a bowel movement.  Keep all follow-up appointments. If you have had an infection before:  Increase the fiber in your diet as directed by your health care provider or dietitian.  Take a dietary fiber supplement if your health care provider approves.  Only take medicines as directed by your health care provider. SEEK MEDICAL CARE IF:   You have abdominal pain.  You have bloating.  You have cramps.  You have not gone to the bathroom in 3 days. SEEK IMMEDIATE  MEDICAL CARE IF:   Your pain gets worse.  Yourbloating becomes very bad.  You have a fever or chills, and your symptoms suddenly get worse.  You begin vomiting.  You have bowel movements that are bloody or black. MAKE SURE YOU:  Understand these instructions.  Will watch your condition.  Will get help right away if you are not doing well or get worse. Document Released: 07/28/2004 Document Revised: 11/05/2013 Document Reviewed: 09/25/2013 [facility] Patient Information 2015 [place], [place]. This information is not intended to replace advice given to you by your health care provider. Make sure you discuss any questions you have with your health care provider.

## 2014-09-01 ENCOUNTER — Encounter (HOSPITAL_COMMUNITY): Payer: Self-pay | Admitting: Internal Medicine

## 2014-09-15 ENCOUNTER — Encounter (HOSPITAL_COMMUNITY): Payer: Self-pay | Admitting: Internal Medicine

## 2014-12-19 ENCOUNTER — Encounter (INDEPENDENT_AMBULATORY_CARE_PROVIDER_SITE_OTHER): Payer: Self-pay

## 2015-08-26 ENCOUNTER — Emergency Department (HOSPITAL_COMMUNITY)
Admission: EM | Admit: 2015-08-26 | Discharge: 2015-08-26 | Disposition: A | Discharge status: Home / Self Care | Payer: Self-pay | Attending: Emergency Medicine | Admitting: Emergency Medicine

## 2015-08-26 ENCOUNTER — Emergency Department (HOSPITAL_COMMUNITY): Payer: Self-pay

## 2015-08-26 ENCOUNTER — Encounter (HOSPITAL_COMMUNITY): Payer: Self-pay | Admitting: Emergency Medicine

## 2015-08-26 DIAGNOSIS — E079 Disorder of thyroid, unspecified: Secondary | ICD-10-CM | POA: Insufficient documentation

## 2015-08-26 DIAGNOSIS — K589 Irritable bowel syndrome without diarrhea: Secondary | ICD-10-CM | POA: Insufficient documentation

## 2015-08-26 DIAGNOSIS — K5732 Diverticulitis of large intestine without perforation or abscess without bleeding: Secondary | ICD-10-CM | POA: Insufficient documentation

## 2015-08-26 DIAGNOSIS — Z3202 Encounter for pregnancy test, result negative: Secondary | ICD-10-CM | POA: Insufficient documentation

## 2015-08-26 DIAGNOSIS — F410 Panic disorder [episodic paroxysmal anxiety] without agoraphobia: Secondary | ICD-10-CM | POA: Insufficient documentation

## 2015-08-26 DIAGNOSIS — I10 Essential (primary) hypertension: Secondary | ICD-10-CM | POA: Insufficient documentation

## 2015-08-26 DIAGNOSIS — Z79899 Other long term (current) drug therapy: Secondary | ICD-10-CM | POA: Insufficient documentation

## 2015-08-26 DIAGNOSIS — Z7982 Long term (current) use of aspirin: Secondary | ICD-10-CM | POA: Insufficient documentation

## 2015-08-26 DIAGNOSIS — E119 Type 2 diabetes mellitus without complications: Secondary | ICD-10-CM | POA: Insufficient documentation

## 2015-08-26 LAB — CBC WITH DIFFERENTIAL/PLATELET
BASOS ABS: 0 10*3/uL (ref 0.0–0.1)
Basophils Relative: 0 %
EOS ABS: 0.1 10*3/uL (ref 0.0–0.7)
EOS PCT: 1 %
HCT: 42.7 % (ref 36.0–46.0)
Hemoglobin: 14.3 g/dL (ref 12.0–15.0)
LYMPHS PCT: 21 %
Lymphs Abs: 4.1 10*3/uL — ABNORMAL HIGH (ref 0.7–4.0)
MCH: 27.7 pg (ref 26.0–34.0)
MCHC: 33.5 g/dL (ref 30.0–36.0)
MCV: 82.6 fL (ref 78.0–100.0)
MONO ABS: 1.3 10*3/uL — AB (ref 0.1–1.0)
Monocytes Relative: 7 %
Neutro Abs: 13.7 10*3/uL — ABNORMAL HIGH (ref 1.7–7.7)
Neutrophils Relative %: 71 %
PLATELETS: 357 10*3/uL (ref 150–400)
RBC: 5.17 MIL/uL — AB (ref 3.87–5.11)
RDW: 15.1 % (ref 11.5–15.5)
WBC: 19.3 10*3/uL — AB (ref 4.0–10.5)

## 2015-08-26 LAB — COMPREHENSIVE METABOLIC PANEL
ALK PHOS: 82 U/L (ref 38–126)
ALT: 32 U/L (ref 14–54)
AST: 23 U/L (ref 15–41)
Albumin: 4.2 g/dL (ref 3.5–5.0)
Anion gap: 12 (ref 5–15)
BILIRUBIN TOTAL: 0.4 mg/dL (ref 0.3–1.2)
BUN: 12 mg/dL (ref 6–20)
CALCIUM: 9.8 mg/dL (ref 8.9–10.3)
CHLORIDE: 100 mmol/L — AB (ref 101–111)
CO2: 27 mmol/L (ref 22–32)
Creatinine, Ser: 0.53 mg/dL (ref 0.44–1.00)
GFR calc non Af Amer: >60 mL/min (ref >60)
Glucose, Bld: 118 mg/dL — ABNORMAL HIGH (ref 65–99)
Potassium: 4 mmol/L (ref 3.5–5.1)
Sodium: 139 mmol/L (ref 135–145)
TOTAL PROTEIN: 8.3 g/dL — AB (ref 6.5–8.1)

## 2015-08-26 LAB — URINALYSIS, ROUTINE W REFLEX MICROSCOPIC
BILIRUBIN URINE: NEGATIVE
Glucose, UA: NEGATIVE mg/dL
Hgb urine dipstick: NEGATIVE
KETONES UR: NEGATIVE mg/dL
Leukocytes, UA: NEGATIVE
Nitrite: NEGATIVE
PH: 6 (ref 5.0–8.0)
Protein, ur: NEGATIVE mg/dL
Specific Gravity, Urine: 1.025 (ref 1.005–1.030)
UROBILINOGEN UA: 0.2 mg/dL (ref 0.0–1.0)

## 2015-08-26 LAB — PREGNANCY, URINE: PREG TEST UR: NEGATIVE

## 2015-08-26 LAB — LIPASE, BLOOD: LIPASE: 28 U/L (ref 22–51)

## 2015-08-26 MED ORDER — METRONIDAZOLE 500 MG PO TABS: 500.0000 mg | ORAL_TABLET | Freq: Two times a day (BID) | ORAL | Status: DC

## 2015-08-26 MED ORDER — ONDANSETRON HCL 4 MG/2ML IJ SOLN
4.0000 mg | Freq: Once | INTRAMUSCULAR | Status: AC
  Administered 2015-08-26: 4 mg via INTRAVENOUS
  Filled 2015-08-26: qty 2

## 2015-08-26 MED ORDER — HYDROCODONE-ACETAMINOPHEN 5-325 MG PO TABS: 1.0000 | ORAL_TABLET | Freq: Four times a day (QID) | ORAL | Status: DC | PRN

## 2015-08-26 MED ORDER — IOHEXOL 300 MG/ML  SOLN
50.0000 mL | Freq: Once | INTRAMUSCULAR | Status: AC | PRN
  Administered 2015-08-26: 50 mL via ORAL

## 2015-08-26 MED ORDER — PROMETHAZINE HCL 25 MG PO TABS: 25.0000 mg | ORAL_TABLET | Freq: Four times a day (QID) | ORAL | Status: DC | PRN

## 2015-08-26 MED ORDER — IOHEXOL 300 MG/ML  SOLN
100.0000 mL | Freq: Once | INTRAMUSCULAR | Status: AC | PRN
  Administered 2015-08-26: 100 mL via INTRAVENOUS

## 2015-08-26 MED ORDER — CIPROFLOXACIN HCL 500 MG PO TABS: 500.0000 mg | ORAL_TABLET | Freq: Two times a day (BID) | ORAL | Status: DC

## 2015-08-26 MED ORDER — MORPHINE SULFATE (PF) 4 MG/ML IV SOLN
4.0000 mg | Freq: Once | INTRAVENOUS | Status: AC
  Administered 2015-08-26: 4 mg via INTRAVENOUS
  Filled 2015-08-26: qty 1

## 2015-08-26 MED ORDER — METRONIDAZOLE 500 MG PO TABS
500.0000 mg | ORAL_TABLET | Freq: Once | ORAL | Status: AC
  Administered 2015-08-26: 500 mg via ORAL
  Filled 2015-08-26: qty 1

## 2015-08-26 MED ORDER — SODIUM CHLORIDE 0.9 % IV BOLUS (SEPSIS)
1000.0000 mL | Freq: Once | INTRAVENOUS | Status: AC
  Administered 2015-08-26: 1000 mL via INTRAVENOUS

## 2015-08-26 MED ORDER — HYDROCODONE-ACETAMINOPHEN 5-325 MG PO TABS
1.0000 | ORAL_TABLET | Freq: Once | ORAL | Status: AC
  Administered 2015-08-26: 1 via ORAL
  Filled 2015-08-26: qty 1

## 2015-08-26 MED ORDER — CIPROFLOXACIN HCL 250 MG PO TABS
500.0000 mg | ORAL_TABLET | Freq: Once | ORAL | Status: AC
  Administered 2015-08-26: 500 mg via ORAL
  Filled 2015-08-26: qty 2

## 2015-08-26 NOTE — ED Notes (Signed)
Pt reports severe abd pain since Sunday after lunch.  Pt has IBS and reports recent constipation and transitioned to diarrhea.  Pt also c/o emesis Sunday. Pt unable to keep down food.  Pt alert and oriented.

## 2015-08-26 NOTE — ED Provider Notes (Signed)
CSN: [id number]     Arrival date & time 08/26/15  1058 History  By signing my name below, I, Tula Nakayama, attest that this documentation has been prepared under the direction and in the presence of Sharlett Iles, MD.  Electronically Signed: Tula Nakayama, ED Scribe. 08/26/2015. 12:40 PM.  Chief Complaint  Patient presents with  . Abdominal Pain   HPI  HPI Comments: Kristin Bailey is a 54 y.o. female with a history of IBS, HTN, DM, diverticulitis and cholecystectomy who presents to the Emergency Department complaining of intermittent, moderate, stabbing abdominal pain that started 3 days ago. Pt reports initial constipation that 3 days ago, which was followed by intermittent vomiting and diarrhea. She also notes lower back pain and sinus congestion. Pt's pain does not change with eating. She has a history of recurrent abdominal pain associated with IBS, but states current symptoms are worse than prior episodes and lack usual cramping sensation. Pt denies blood in her stool, vomiting, fever, dysuria and hematuria.  Past Medical History  Diagnosis Date  . Hypertension   . Thyroid disease   . Diabetes mellitus without complication ([place])   . Anxiety attack   . IBS (irritable bowel syndrome)   . PONV (postoperative nausea and vomiting)    Past Surgical History  Procedure Laterality Date  . Cesarean section    . Tubal ligation    . Cholecystectomy    . Fracture surgery      rt ankle   . Carpal tunnel release    . Cyst removal hand    . Knee surgery    . Colonoscopy N/A 08/28/2014    Procedure: COLONOSCOPY;  Surgeon: Rogene Houston, MD;  Location: [facility];  Service: Endoscopy;  Laterality: N/A;  1030   Family History  Problem Relation Age of Onset  . Cancer Mother     colon cancer age74  . Hypertension Mother   . Stroke Mother   . Diabetes Mother   . Cancer Father   . Hypertension Father   . Diabetes Father   . Kidney disease Paternal Aunt   . Cancer  Paternal Aunt   . Kidney disease Paternal Uncle   . Cancer Paternal Uncle   . Stroke Maternal Grandmother    Social History  Substance Use Topics  . Smoking status: Never Smoker   . Smokeless tobacco: Never Used  . Alcohol Use: No   OB History    Gravida Para Term Preterm AB TAB SAB Ectopic Multiple Living   1 1 1             Review of Systems A complete 10 system review of systems was obtained and all systems are negative except as noted in the HPI and PMH.    Allergies  Review of patient's allergies indicates no known allergies.  Home Medications   Prior to Admission medications   Medication Sig Start Date End Date Taking? Authorizing Provider  acetaminophen (TYLENOL) 650 MG CR tablet Take 1,300 mg by mouth 2 (two) times daily as needed for pain.   Yes Historical Provider, MD  albuterol (PROVENTIL HFA;VENTOLIN HFA) 108 (90 BASE) MCG/ACT inhaler Inhale 2 puffs into the lungs every 4 (four) hours as needed for wheezing or shortness of breath.   Yes Historical Provider, MD  ALPRAZolam (XANAX) 0.25 MG tablet Take 0.25 mg by mouth 2 (two) times daily as needed for anxiety.   Yes Historical Provider, MD  aspirin EC 81 MG tablet Take 81 mg by  mouth daily.   Yes Historical Provider, MD  Dulaglutide (TRULICITY) 5.42 HC/6.2BJ SOPN Inject 0.5 mLs into the skin once a week. On  Monday's.   Yes Historical Provider, MD  DULoxetine (CYMBALTA) 60 MG capsule Take 60 mg by mouth daily.   Yes Historical Provider, MD  glipiZIDE (GLUCOTROL) 5 MG tablet Take 5 mg by mouth 2 (two) times daily.    Yes Historical Provider, MD  hyoscyamine (LEVSIN, ANASPAZ) 0.125 MG tablet Take 0.125 mg by mouth every 4 (four) hours as needed for cramping.    Yes Historical Provider, MD  levothyroxine (SYNTHROID, LEVOTHROID) 75 MCG tablet Take 75 mcg by mouth daily before breakfast.   Yes Historical Provider, MD  lisinopril-hydrochlorothiazide (PRINZIDE,ZESTORETIC) 20-12.5 MG per tablet Take 1 tablet by mouth daily.   Yes  Historical Provider, MD  loperamide (IMODIUM) 2 MG capsule Take 4 mg by mouth as needed for diarrhea or loose stools.   Yes Historical Provider, MD  loratadine (CLARITIN) 10 MG tablet Take 10 mg by mouth daily.   Yes Historical Provider, MD  Multiple Vitamin (MULTIVITAMIN WITH MINERALS) TABS tablet Take 1 tablet by mouth daily.   Yes Historical Provider, MD  omeprazole (PRILOSEC OTC) 20 MG tablet Take 20 mg by mouth daily.   Yes Historical Provider, MD  Probiotic Product (PROBIOTIC DAILY PO) Take 1 capsule by mouth daily.   Yes Historical Provider, MD  ciprofloxacin (CIPRO) 500 MG tablet Take 1 tablet (500 mg total) by mouth 2 (two) times daily. 08/26/15   Sharlett Iles, MD  HYDROcodone-acetaminophen (NORCO/VICODIN) 5-325 MG tablet Take 1-2 tablets by mouth every 6 (six) hours as needed for severe pain. 08/26/15   Sharlett Iles, MD  metroNIDAZOLE (FLAGYL) 500 MG tablet Take 1 tablet (500 mg total) by mouth 2 (two) times daily. 08/26/15   Sharlett Iles, MD  promethazine (PHENERGAN) 25 MG tablet Take 1 tablet (25 mg total) by mouth every 6 (six) hours as needed for nausea or vomiting. 08/26/15   Wenda Overland Deanthony Maull, MD   BP 126/88 mmHg  Pulse 94  Temp(Src) 97.8 F (36.6 C) (Oral)  Resp 20  Ht 5\' 6"  (1.676 m)  Wt 277 lb (125.646 kg)  BMI 44.73 kg/m2  SpO2 96%  LMP  (Within Months) Physical Exam  Constitutional: She is oriented to person, place, and time. She appears well-developed and well-nourished. No distress.  Uncomfortable Rubbing belly  HENT:  Head: Normocephalic and atraumatic.  Mouth/Throat: Oropharynx is clear and moist.  Eyes: Conjunctivae are normal. Pupils are equal, round, and reactive to light.  Neck: Neck supple. No tracheal deviation present.  Cardiovascular: Normal rate, regular rhythm and normal heart sounds.   No murmur heard. Pulmonary/Chest: Effort normal and breath sounds normal. No respiratory distress. She has no wheezes.  Abdominal: Soft.  Bowel sounds are normal. She exhibits no distension. There is tenderness (TTP of LLQ and periumbilical abdomen). There is no rebound and no guarding.  Musculoskeletal: She exhibits no edema.  Neurological: She is alert and oriented to person, place, and time.  Skin: Skin is warm and dry.  Psychiatric: She has a normal mood and affect. Her behavior is normal.  Nursing note and vitals reviewed.   ED Course  Procedures   DIAGNOSTIC STUDIES: Oxygen Saturation is 99% on RA, normal by my interpretation.    COORDINATION OF CARE: 12:37 PM Discussed treatment plan with pt which includes UA, lab work and diagnostic imaging. Pt agreed to plan.  Labs Review Labs Reviewed  COMPREHENSIVE  METABOLIC PANEL - Abnormal; Notable for the following:    Chloride 100 (*)    Glucose, Bld 118 (*)    Total Protein 8.3 (*)    All other components within normal limits  CBC WITH DIFFERENTIAL/PLATELET - Abnormal; Notable for the following:    WBC 19.3 (*)    RBC 5.17 (*)    Neutro Abs 13.7 (*)    Lymphs Abs 4.1 (*)    Monocytes Absolute 1.3 (*)    All other components within normal limits  LIPASE, BLOOD  PREGNANCY, URINE  URINALYSIS, ROUTINE W REFLEX MICROSCOPIC (NOT AT [facility])    Imaging Review Ct Abdomen Pelvis W Contrast  08/26/2015  CLINICAL DATA:  Abdominal pain.  Leukocytosis. EXAM: CT ABDOMEN AND PELVIS WITH CONTRAST TECHNIQUE: Multidetector CT imaging of the abdomen and pelvis was performed using the standard protocol following bolus administration of intravenous contrast. CONTRAST:  22mL OMNIPAQUE IOHEXOL 300 MG/ML SOLN, 170mL OMNIPAQUE IOHEXOL 300 MG/ML SOLN COMPARISON:  Report of CT scan dated 10/01/2011 FINDINGS: Lower chest:  Normal. Hepatobiliary: Chronic hepatic steatosis. Slight hepatomegaly. Gallbladder has been removed. No dilated bile ducts. Pancreas: Normal. Spleen: Normal. Adrenals/Urinary Tract: The right kidney is slightly malrotated due to inferior displacement by the enlarged right  lobe of the liver. The kidneys are otherwise normal. Adrenal glands are normal. Both ureters and bladder appear normal. Stomach/Bowel: There are multiple diverticula in the mid sigmoid portion of the colon with slight edema of the mucosa and very subtle of pericolonic soft tissue stranding consistent with slight diverticulitis. The bowel is otherwise normal including the terminal ileum and appendix. There are several dense ingested tablets in the bowel including 1 in the rectum and 1 in the cecum. Vascular/Lymphatic: Normal. Reproductive: Normal. Other: No free air or free fluid. Musculoskeletal: Normal. IMPRESSION: 1. Subtle mild mid sigmoid diverticulitis. 2. Chronic hepatic steatosis. 3. Undissolved dense ingested tablets in the colon. Electronically Signed   By: Lorriane Shire M.D.   On: 08/26/2015 15:18   I have personally reviewed and evaluated these lab results as part of my medical decision-making.   EKG Interpretation None     Medications  morphine 4 MG/ML injection 4 mg (4 mg Intravenous Given 08/26/15 1248)  ondansetron (ZOFRAN) injection 4 mg (4 mg Intravenous Given 08/26/15 1248)  sodium chloride 0.9 % bolus 1,000 mL (0 mLs Intravenous Stopped 08/26/15 1521)  iohexol (OMNIPAQUE) 300 MG/ML solution 50 mL (50 mLs Oral Contrast Given 08/26/15 1339)  iohexol (OMNIPAQUE) 300 MG/ML solution 100 mL (100 mLs Intravenous Contrast Given 08/26/15 1445)  ciprofloxacin (CIPRO) tablet 500 mg (500 mg Oral Given 08/26/15 1604)  metroNIDAZOLE (FLAGYL) tablet 500 mg (500 mg Oral Given 08/26/15 1604)  HYDROcodone-acetaminophen (NORCO/VICODIN) 5-325 MG per tablet 1 tablet (1 tablet Oral Given 08/26/15 1617)    MDM   Final diagnoses:  Diverticulitis of large intestine without perforation or abscess without bleeding   54 year old female who presents with generalized abdominal pain for the past 3-4 days  with alternating constipation and diarrhea. Patient states that pain is different from usual IBS  cramping. On exam, patient is uncomfortable, rubbing abdomen but in no acute distress. Vital signs stable. She had periumbilical and left lower quadrant tenderness on exam. Obtained above labs which were notable for leukocytosis with WBC 19,000. obtained CT of the abdomen and pelvis to evaluate for diverticulitis, SBO, or other acute intra-abdominal process. Gave the patient morphine, Zofran, and an IV fluid bolus.   CT showed mild sigmoid diverticulitis without other complications. The  patient was able to tolerate oral ciprofloxacin and Flagyl as well as pain medications in the ED. Instructed on supportive care and provided prescriptions for these medications. Reviewed return precautions and patient voiced understanding. Patient was discharged in satisfactory condition.  I personally performed the services described in this documentation, which was scribed in my presence. The recorded information has been reviewed and is accurate.    Sharlett Iles, MD 08/26/15 [id number]

## 2015-08-30 ENCOUNTER — Emergency Department (HOSPITAL_COMMUNITY): Payer: Self-pay

## 2015-08-30 ENCOUNTER — Emergency Department (HOSPITAL_COMMUNITY)
Admission: EM | Admit: 2015-08-30 | Discharge: 2015-08-30 | Disposition: A | Discharge status: Home / Self Care | Payer: Self-pay | Attending: Emergency Medicine | Admitting: Emergency Medicine

## 2015-08-30 ENCOUNTER — Encounter (HOSPITAL_COMMUNITY): Payer: Self-pay | Admitting: Emergency Medicine

## 2015-08-30 DIAGNOSIS — Z9049 Acquired absence of other specified parts of digestive tract: Secondary | ICD-10-CM | POA: Insufficient documentation

## 2015-08-30 DIAGNOSIS — Z792 Long term (current) use of antibiotics: Secondary | ICD-10-CM | POA: Insufficient documentation

## 2015-08-30 DIAGNOSIS — R1011 Right upper quadrant pain: Secondary | ICD-10-CM | POA: Insufficient documentation

## 2015-08-30 DIAGNOSIS — I10 Essential (primary) hypertension: Secondary | ICD-10-CM | POA: Insufficient documentation

## 2015-08-30 DIAGNOSIS — R110 Nausea: Secondary | ICD-10-CM | POA: Insufficient documentation

## 2015-08-30 DIAGNOSIS — K589 Irritable bowel syndrome without diarrhea: Secondary | ICD-10-CM | POA: Insufficient documentation

## 2015-08-30 DIAGNOSIS — R1013 Epigastric pain: Secondary | ICD-10-CM | POA: Insufficient documentation

## 2015-08-30 DIAGNOSIS — E119 Type 2 diabetes mellitus without complications: Secondary | ICD-10-CM | POA: Insufficient documentation

## 2015-08-30 DIAGNOSIS — Z79899 Other long term (current) drug therapy: Secondary | ICD-10-CM | POA: Insufficient documentation

## 2015-08-30 DIAGNOSIS — E079 Disorder of thyroid, unspecified: Secondary | ICD-10-CM | POA: Insufficient documentation

## 2015-08-30 DIAGNOSIS — F410 Panic disorder [episodic paroxysmal anxiety] without agoraphobia: Secondary | ICD-10-CM | POA: Insufficient documentation

## 2015-08-30 DIAGNOSIS — Z9851 Tubal ligation status: Secondary | ICD-10-CM | POA: Insufficient documentation

## 2015-08-30 DIAGNOSIS — Z7982 Long term (current) use of aspirin: Secondary | ICD-10-CM | POA: Insufficient documentation

## 2015-08-30 DIAGNOSIS — R630 Anorexia: Secondary | ICD-10-CM | POA: Insufficient documentation

## 2015-08-30 HISTORY — DX: Diverticulitis of intestine, part unspecified, without perforation or abscess without bleeding: K57.92

## 2015-08-30 LAB — COMPREHENSIVE METABOLIC PANEL
ALK PHOS: 81 U/L (ref 38–126)
ALT: 39 U/L (ref 14–54)
ANION GAP: 9 (ref 5–15)
AST: 39 U/L (ref 15–41)
Albumin: 4.2 g/dL (ref 3.5–5.0)
BUN: 8 mg/dL (ref 6–20)
CALCIUM: 9.5 mg/dL (ref 8.9–10.3)
CO2: 25 mmol/L (ref 22–32)
Chloride: 101 mmol/L (ref 101–111)
Creatinine, Ser: 0.61 mg/dL (ref 0.44–1.00)
GFR calc non Af Amer: >60 mL/min (ref >60)
Glucose, Bld: 148 mg/dL — ABNORMAL HIGH (ref 65–99)
POTASSIUM: 3.6 mmol/L (ref 3.5–5.1)
SODIUM: 135 mmol/L (ref 135–145)
TOTAL PROTEIN: 8.2 g/dL — AB (ref 6.5–8.1)
Total Bilirubin: 0.5 mg/dL (ref 0.3–1.2)

## 2015-08-30 LAB — CBC WITH DIFFERENTIAL/PLATELET
BASOS ABS: 0 10*3/uL (ref 0.0–0.1)
BASOS PCT: 0 %
EOS ABS: 0.2 10*3/uL (ref 0.0–0.7)
Eosinophils Relative: 1 %
HCT: 43.3 % (ref 36.0–46.0)
HEMOGLOBIN: 14.7 g/dL (ref 12.0–15.0)
LYMPHS PCT: 23 %
Lymphs Abs: 3.9 10*3/uL (ref 0.7–4.0)
MCH: 27.8 pg (ref 26.0–34.0)
MCHC: 33.9 g/dL (ref 30.0–36.0)
MCV: 82 fL (ref 78.0–100.0)
MONOS PCT: 8 %
Monocytes Absolute: 1.4 10*3/uL — ABNORMAL HIGH (ref 0.1–1.0)
NEUTROS PCT: 68 %
Neutro Abs: 11.6 10*3/uL — ABNORMAL HIGH (ref 1.7–7.7)
Platelets: 342 10*3/uL (ref 150–400)
RBC: 5.28 MIL/uL — ABNORMAL HIGH (ref 3.87–5.11)
RDW: 15.1 % (ref 11.5–15.5)
WBC: 17.1 10*3/uL — ABNORMAL HIGH (ref 4.0–10.5)

## 2015-08-30 LAB — URINALYSIS, ROUTINE W REFLEX MICROSCOPIC
Bilirubin Urine: NEGATIVE
GLUCOSE, UA: 100 mg/dL — AB
Hgb urine dipstick: NEGATIVE
KETONES UR: NEGATIVE mg/dL
LEUKOCYTES UA: NEGATIVE
Nitrite: NEGATIVE
PH: 5.5 (ref 5.0–8.0)
PROTEIN: NEGATIVE mg/dL
SPECIFIC GRAVITY, URINE: 1.025 (ref 1.005–1.030)
Urobilinogen, UA: 0.2 mg/dL (ref 0.0–1.0)

## 2015-08-30 MED ORDER — PROMETHAZINE HCL 25 MG/ML IJ SOLN
25.0000 mg | Freq: Once | INTRAMUSCULAR | Status: AC
  Administered 2015-08-30: 25 mg via INTRAVENOUS
  Filled 2015-08-30: qty 1

## 2015-08-30 MED ORDER — ONDANSETRON HCL 4 MG/2ML IJ SOLN
4.0000 mg | Freq: Once | INTRAMUSCULAR | Status: AC
  Administered 2015-08-30: 4 mg via INTRAVENOUS
  Filled 2015-08-30: qty 2

## 2015-08-30 MED ORDER — SODIUM CHLORIDE 0.9 % IV BOLUS (SEPSIS)
1000.0000 mL | Freq: Once | INTRAVENOUS | Status: AC
  Administered 2015-08-30: 1000 mL via INTRAVENOUS

## 2015-08-30 MED ORDER — LORAZEPAM 2 MG/ML IJ SOLN
1.0000 mg | Freq: Once | INTRAMUSCULAR | Status: AC
  Administered 2015-08-30: 1 mg via INTRAVENOUS
  Filled 2015-08-30: qty 1

## 2015-08-30 MED ORDER — DICYCLOMINE HCL 10 MG/ML IM SOLN
20.0000 mg | Freq: Once | INTRAMUSCULAR | Status: AC
  Administered 2015-08-30: 20 mg via INTRAMUSCULAR
  Filled 2015-08-30: qty 2

## 2015-08-30 MED ORDER — GI COCKTAIL ~~LOC~~
30.0000 mL | Freq: Once | ORAL | Status: AC
  Administered 2015-08-30: 30 mL via ORAL
  Filled 2015-08-30: qty 30

## 2015-08-30 MED ORDER — OMEPRAZOLE MAGNESIUM 20 MG PO TBEC: 20.0000 mg | DELAYED_RELEASE_TABLET | Freq: Every day | ORAL | Status: DC

## 2015-08-30 MED ORDER — ONDANSETRON 4 MG PO TBDP: 4.0000 mg | ORAL_TABLET | Freq: Three times a day (TID) | ORAL | Status: DC | PRN

## 2015-08-30 MED ORDER — SUCRALFATE 1 GM/10ML PO SUSP
1.0000 g | Freq: Three times a day (TID) | ORAL | Status: DC
  Administered 2015-08-30: 1 g via ORAL
  Filled 2015-08-30: qty 10

## 2015-08-30 NOTE — ED Notes (Signed)
Pt began having violent dry heaves. MD notified and said the Ativan order should help pt with nausea also. No additional orders given.

## 2015-08-30 NOTE — ED Provider Notes (Signed)
CSN: [id number]     Arrival date & time 08/30/15  1347 History   First MD Initiated Contact with Patient 08/30/15 1359     Chief Complaint  Patient presents with  . Abdominal Pain     (Consider location/radiation/quality/duration/timing/severity/associated sxs/prior Treatment) HPI Patient presents for days after recent evaluation, now with concern for persistent anorexia, nausea, epigastric discomfort. Patient's illness began about one week ago, and after several days, she presented here for evaluation. She was diagnosed with diverticulitis, states that she has been taking all antibiotics as prescribed since that time. No new fever, syncope, chest pain, dyspnea. No new lower abdominal pain, but the pain is persistently in the upper quadrant right-sided, and epigastrium. No vomiting, though the patient has been retching. No relief with anything.  Past Medical History  Diagnosis Date  . Hypertension   . Thyroid disease   . Diabetes mellitus without complication ([place])   . Anxiety attack   . IBS (irritable bowel syndrome)   . PONV (postoperative nausea and vomiting)   . Diverticulitis    Past Surgical History  Procedure Laterality Date  . Cesarean section    . Tubal ligation    . Cholecystectomy    . Fracture surgery      rt ankle   . Carpal tunnel release    . Cyst removal hand    . Knee surgery    . Colonoscopy N/A 08/28/2014    Procedure: COLONOSCOPY;  Surgeon: Rogene Houston, MD;  Location: [facility];  Service: Endoscopy;  Laterality: N/A;  1030   Family History  Problem Relation Age of Onset  . Cancer Mother     colon cancer age74  . Hypertension Mother   . Stroke Mother   . Diabetes Mother   . Cancer Father   . Hypertension Father   . Diabetes Father   . Kidney disease Paternal Aunt   . Cancer Paternal Aunt   . Kidney disease Paternal Uncle   . Cancer Paternal Uncle   . Stroke Maternal Grandmother    Social History  Substance Use Topics  . Smoking  status: Never Smoker   . Smokeless tobacco: Never Used  . Alcohol Use: No   OB History    Gravida Para Term Preterm AB TAB SAB Ectopic Multiple Living   1 1 1       1      Review of Systems  Constitutional:       Per HPI, otherwise negative  HENT:       Per HPI, otherwise negative  Respiratory:       Per HPI, otherwise negative  Cardiovascular:       Per HPI, otherwise negative  Gastrointestinal: Negative for vomiting.  Endocrine:       Negative aside from HPI  Genitourinary:       Neg aside from HPI   Musculoskeletal:       Per HPI, otherwise negative  Skin: Negative.   Neurological: Negative for syncope.      Allergies  Review of patient's allergies indicates no known allergies.  Home Medications   Prior to Admission medications   Medication Sig Start Date End Date Taking? Authorizing Provider  acetaminophen (TYLENOL) 650 MG CR tablet Take 1,300 mg by mouth 2 (two) times daily as needed for pain.    Historical Provider, MD  albuterol (PROVENTIL HFA;VENTOLIN HFA) 108 (90 BASE) MCG/ACT inhaler Inhale 2 puffs into the lungs every 4 (four) hours as needed for wheezing or shortness  of breath.    Historical Provider, MD  ALPRAZolam Duanne Moron) 0.25 MG tablet Take 0.25 mg by mouth 2 (two) times daily as needed for anxiety.    Historical Provider, MD  aspirin EC 81 MG tablet Take 81 mg by mouth daily.    Historical Provider, MD  ciprofloxacin (CIPRO) 500 MG tablet Take 1 tablet (500 mg total) by mouth 2 (two) times daily. 08/26/15   Sharlett Iles, MD  Dulaglutide (TRULICITY) 0.94 BS/9.6GE SOPN Inject 0.5 mLs into the skin once a week. On  Monday's.    Historical Provider, MD  DULoxetine (CYMBALTA) 60 MG capsule Take 60 mg by mouth daily.    Historical Provider, MD  glipiZIDE (GLUCOTROL) 5 MG tablet Take 5 mg by mouth 2 (two) times daily.     Historical Provider, MD  HYDROcodone-acetaminophen (NORCO/VICODIN) 5-325 MG tablet Take 1-2 tablets by mouth every 6 (six) hours as  needed for severe pain. 08/26/15   Sharlett Iles, MD  hyoscyamine (LEVSIN, ANASPAZ) 0.125 MG tablet Take 0.125 mg by mouth every 4 (four) hours as needed for cramping.     Historical Provider, MD  levothyroxine (SYNTHROID, LEVOTHROID) 75 MCG tablet Take 75 mcg by mouth daily before breakfast.    Historical Provider, MD  lisinopril-hydrochlorothiazide (PRINZIDE,ZESTORETIC) 20-12.5 MG per tablet Take 1 tablet by mouth daily.    Historical Provider, MD  loperamide (IMODIUM) 2 MG capsule Take 4 mg by mouth as needed for diarrhea or loose stools.    Historical Provider, MD  loratadine (CLARITIN) 10 MG tablet Take 10 mg by mouth daily.    Historical Provider, MD  metroNIDAZOLE (FLAGYL) 500 MG tablet Take 1 tablet (500 mg total) by mouth 2 (two) times daily. 08/26/15   Sharlett Iles, MD  Multiple Vitamin (MULTIVITAMIN WITH MINERALS) TABS tablet Take 1 tablet by mouth daily.    Historical Provider, MD  omeprazole (PRILOSEC OTC) 20 MG tablet Take 20 mg by mouth daily.    Historical Provider, MD  Probiotic Product (PROBIOTIC DAILY PO) Take 1 capsule by mouth daily.    Historical Provider, MD  promethazine (PHENERGAN) 25 MG tablet Take 1 tablet (25 mg total) by mouth every 6 (six) hours as needed for nausea or vomiting. 08/26/15   Wenda Overland Little, MD   BP 125/74 mmHg  Pulse 98  Temp(Src) 98.8 F (37.1 C) (Oral)  Resp 18  Wt 277 lb (125.646 kg)  SpO2 96%  LMP  (Within Months) Physical Exam  Constitutional: She is oriented to person, place, and time. She appears well-developed and well-nourished. No distress.  HENT:  Head: Normocephalic and atraumatic.  Eyes: Conjunctivae and EOM are normal.  Cardiovascular: Normal rate and regular rhythm.   Pulmonary/Chest: Effort normal and breath sounds normal. No stridor. No respiratory distress.  Abdominal: She exhibits no distension. There is tenderness in the right upper quadrant and epigastric area.  Musculoskeletal: She exhibits no edema.   Neurological: She is alert and oriented to person, place, and time. No cranial nerve deficit.  Skin: Skin is warm and dry.  Psychiatric: She has a normal mood and affect.  Nursing note and vitals reviewed.   ED Course  Procedures (including critical care time) Labs Review Labs Reviewed  COMPREHENSIVE METABOLIC PANEL - Abnormal; Notable for the following:    Glucose, Bld 148 (*)    Total Protein 8.2 (*)    All other components within normal limits  CBC WITH DIFFERENTIAL/PLATELET - Abnormal; Notable for the following:    WBC 17.1 (*)  RBC 5.28 (*)    Neutro Abs 11.6 (*)    Monocytes Absolute 1.4 (*)    All other components within normal limits  URINALYSIS, ROUTINE W REFLEX MICROSCOPIC (NOT AT [facility]) - Abnormal; Notable for the following:    Glucose, UA 100 (*)    All other components within normal limits    Imaging Review Dg Abd Portable 1v  08/30/2015  CLINICAL DATA:  Nausea and dry heaves. Question free intraperitoneal air. Recent diagnosis of diverticulitis. EXAM: PORTABLE ABDOMEN - 1 VIEW COMPARISON:  None. FINDINGS: Single upright view of the abdomen demonstrates no free intraperitoneal air. Bowel gas pattern cannot be assessed. IMPRESSION: Negative for free intraperitoneal air. Electronically Signed   By: Inge Rise M.D.   On: 08/30/2015 15:55   I have personally reviewed and evaluated these images and lab results as part of my medical decision-making.  Chart review demonstrates recent diagnosis of diverticulitis.  3:09 PM Patient actively vomiting, substantially.  Initial labs reassuring, w decreasing leukocytosis   5:29 PM Patient substantially better with fluid resuscitation, antiemetics. Patient had a lengthy conversation with her and her husband about all results, need to continue taking medication as directed, follow up with primary care. MDM  Patient presents several days after recent diagnosis of diverticulitis, now with recurrence of nausea,  vomiting. Here, the patient has generally reassuring findings, with leukocytosis diminished since initial diagnosis, no x-ray evidence for perforation, otherwise reassuring labs. Patient required fluids, antiemetics, analgesia for significant control, but was tolerant of oral intake, medication, and should be able to tolerate the remainder of her antibiotics for diverticulitis. Patient's improvement made her a reasonable candidate to continue outpatient therapy.  Carmin Muskrat, MD 08/30/15 1806

## 2015-08-30 NOTE — ED Notes (Signed)
Pt tearful, anxious and rocking back and forth in the bed. Pt reports "I have anxiety and I think I'm having a panic attack". Pt reports taking Xanax at home but was unable to this morning due to nausea. MD notified. Order given.

## 2015-08-30 NOTE — ED Notes (Signed)
Patient c/o right upper abd pain with nausea, vomiting, and "loose stools." Denies any fevers or urinary symptoms. Patient was seen here on Wednesday and diagnosed with Diverticulitis in which she has been taking 2 antibiotics. Patient states "I felt like I was getting better till Friday. I ate something and immediately started dry heaving."

## 2015-08-30 NOTE — ED Notes (Signed)
MD at bedside. 

## 2015-08-30 NOTE — Discharge Instructions (Signed)
As discussed, with your ongoing abdominal pain, it is important to take all medication as directed, specifically your antibiotics, and nausea medicine as needed.  In addition, you may choose to use over-the-counter, Maalox, 3 times daily for additional release.  Return here for concerning changes in your condition, otherwise be sure to follow-up with your physician.

## 2015-08-30 NOTE — ED Notes (Signed)
Pt c/o intermittent sharp RUQ pain that has progressively gotten worse over the last week. Pain originally started last Sunday 08/20/15 and was diagnosed with Diverticulitis on Wed 08/23/15 here at [facility] and given antibiotics which she has been taking. Pt was also given Phenergan to help with nausea but was unable to afford the medication and therefore hasn't been able to use it to help with nausea. Pt c/o constant nausea and dry heaves. Pt reports loose stools a few days ago but none since. Denies fever and chills, reports hot and cold flashes but attributes this to menopause. Pt does also report "discomfort" after urinating. Pt has IBS but nothing different from her usual stools except for 1 day of loose stools 2-3 days ago.

## 2015-11-27 ENCOUNTER — Emergency Department (HOSPITAL_COMMUNITY): Payer: Self-pay

## 2015-11-27 ENCOUNTER — Inpatient Hospital Stay (HOSPITAL_COMMUNITY)
Admission: EM | Admit: 2015-11-27 | Discharge: 2015-11-30 | DRG: 871 | Disposition: A | Discharge status: Home / Self Care | Payer: Self-pay | Attending: Internal Medicine | Admitting: Internal Medicine

## 2015-11-27 ENCOUNTER — Encounter (HOSPITAL_COMMUNITY): Payer: Self-pay | Admitting: *Deleted

## 2015-11-27 DIAGNOSIS — R197 Diarrhea, unspecified: Secondary | ICD-10-CM

## 2015-11-27 DIAGNOSIS — F410 Panic disorder [episodic paroxysmal anxiety] without agoraphobia: Secondary | ICD-10-CM

## 2015-11-27 DIAGNOSIS — F418 Other specified anxiety disorders: Secondary | ICD-10-CM | POA: Diagnosis present

## 2015-11-27 DIAGNOSIS — Z807 Family history of other malignant neoplasms of lymphoid, hematopoietic and related tissues: Secondary | ICD-10-CM

## 2015-11-27 DIAGNOSIS — Z7982 Long term (current) use of aspirin: Secondary | ICD-10-CM

## 2015-11-27 DIAGNOSIS — I959 Hypotension, unspecified: Secondary | ICD-10-CM

## 2015-11-27 DIAGNOSIS — Z800 Family history of malignant neoplasm of digestive organs: Secondary | ICD-10-CM

## 2015-11-27 DIAGNOSIS — R112 Nausea with vomiting, unspecified: Secondary | ICD-10-CM

## 2015-11-27 DIAGNOSIS — E669 Obesity, unspecified: Secondary | ICD-10-CM | POA: Diagnosis present

## 2015-11-27 DIAGNOSIS — Z6841 Body Mass Index (BMI) 40.0 and over, adult: Secondary | ICD-10-CM

## 2015-11-27 DIAGNOSIS — E872 Acidosis, unspecified: Secondary | ICD-10-CM

## 2015-11-27 DIAGNOSIS — E039 Hypothyroidism, unspecified: Secondary | ICD-10-CM | POA: Diagnosis present

## 2015-11-27 DIAGNOSIS — M1990 Unspecified osteoarthritis, unspecified site: Secondary | ICD-10-CM | POA: Diagnosis present

## 2015-11-27 DIAGNOSIS — F329 Major depressive disorder, single episode, unspecified: Secondary | ICD-10-CM | POA: Diagnosis present

## 2015-11-27 DIAGNOSIS — N179 Acute kidney failure, unspecified: Secondary | ICD-10-CM | POA: Diagnosis present

## 2015-11-27 DIAGNOSIS — A419 Sepsis, unspecified organism: Principal | ICD-10-CM | POA: Diagnosis present

## 2015-11-27 DIAGNOSIS — E119 Type 2 diabetes mellitus without complications: Secondary | ICD-10-CM

## 2015-11-27 DIAGNOSIS — K219 Gastro-esophageal reflux disease without esophagitis: Secondary | ICD-10-CM | POA: Diagnosis present

## 2015-11-27 DIAGNOSIS — N390 Urinary tract infection, site not specified: Secondary | ICD-10-CM | POA: Diagnosis present

## 2015-11-27 DIAGNOSIS — I10 Essential (primary) hypertension: Secondary | ICD-10-CM | POA: Diagnosis present

## 2015-11-27 DIAGNOSIS — J9601 Acute respiratory failure with hypoxia: Secondary | ICD-10-CM | POA: Diagnosis present

## 2015-11-27 DIAGNOSIS — Z8249 Family history of ischemic heart disease and other diseases of the circulatory system: Secondary | ICD-10-CM

## 2015-11-27 DIAGNOSIS — Z9049 Acquired absence of other specified parts of digestive tract: Secondary | ICD-10-CM

## 2015-11-27 DIAGNOSIS — K529 Noninfective gastroenteritis and colitis, unspecified: Secondary | ICD-10-CM | POA: Diagnosis present

## 2015-11-27 DIAGNOSIS — Z7984 Long term (current) use of oral hypoglycemic drugs: Secondary | ICD-10-CM

## 2015-11-27 DIAGNOSIS — E860 Dehydration: Secondary | ICD-10-CM | POA: Diagnosis present

## 2015-11-27 DIAGNOSIS — R000 Tachycardia, unspecified: Secondary | ICD-10-CM

## 2015-11-27 DIAGNOSIS — F32A Depression, unspecified: Secondary | ICD-10-CM

## 2015-11-27 DIAGNOSIS — F411 Generalized anxiety disorder: Secondary | ICD-10-CM | POA: Diagnosis present

## 2015-11-27 DIAGNOSIS — D72829 Elevated white blood cell count, unspecified: Secondary | ICD-10-CM | POA: Diagnosis present

## 2015-11-27 DIAGNOSIS — Z833 Family history of diabetes mellitus: Secondary | ICD-10-CM

## 2015-11-27 DIAGNOSIS — R6521 Severe sepsis with septic shock: Secondary | ICD-10-CM | POA: Diagnosis present

## 2015-11-27 HISTORY — DX: Depression, unspecified: F32.A

## 2015-11-27 HISTORY — DX: Major depressive disorder, single episode, unspecified: F32.9

## 2015-11-27 HISTORY — DX: Unspecified osteoarthritis, unspecified site: M19.90

## 2015-11-27 LAB — CBC WITH DIFFERENTIAL/PLATELET
BASOS ABS: 0 10*3/uL (ref 0.0–0.1)
BASOS PCT: 0 %
EOS ABS: 0.1 10*3/uL (ref 0.0–0.7)
Eosinophils Relative: 0 %
HCT: 51.5 % — ABNORMAL HIGH (ref 36.0–46.0)
HEMOGLOBIN: 16.7 g/dL — AB (ref 12.0–15.0)
LYMPHS ABS: 4 10*3/uL (ref 0.7–4.0)
LYMPHS PCT: 12 %
MCH: 27.7 pg (ref 26.0–34.0)
MCHC: 32.4 g/dL (ref 30.0–36.0)
MCV: 85.4 fL (ref 78.0–100.0)
Monocytes Absolute: 0.7 10*3/uL (ref 0.1–1.0)
Monocytes Relative: 2 %
NEUTROS ABS: 27.2 10*3/uL — AB (ref 1.7–7.7)
Neutrophils Relative %: 85 %
Platelets: 434 10*3/uL — ABNORMAL HIGH (ref 150–400)
RBC: 6.03 MIL/uL — AB (ref 3.87–5.11)
RDW: 14.5 % (ref 11.5–15.5)
WBC: 32 10*3/uL — ABNORMAL HIGH (ref 4.0–10.5)

## 2015-11-27 LAB — URINALYSIS, ROUTINE W REFLEX MICROSCOPIC
Bilirubin Urine: NEGATIVE
GLUCOSE, UA: 100 mg/dL — AB
HGB URINE DIPSTICK: NEGATIVE
Ketones, ur: NEGATIVE mg/dL
Nitrite: NEGATIVE
PH: 7 (ref 5.0–8.0)
PROTEIN: 100 mg/dL — AB
SPECIFIC GRAVITY, URINE: 1.015 (ref 1.005–1.030)

## 2015-11-27 LAB — COMPREHENSIVE METABOLIC PANEL
ALBUMIN: 4.3 g/dL (ref 3.5–5.0)
ALT: 47 U/L (ref 14–54)
AST: 48 U/L — AB (ref 15–41)
Alkaline Phosphatase: 90 U/L (ref 38–126)
Anion gap: 18 — ABNORMAL HIGH (ref 5–15)
BILIRUBIN TOTAL: 0.8 mg/dL (ref 0.3–1.2)
BUN: 19 mg/dL (ref 6–20)
CO2: 18 mmol/L — AB (ref 22–32)
Calcium: 9.6 mg/dL (ref 8.9–10.3)
Chloride: 102 mmol/L (ref 101–111)
Creatinine, Ser: 1.18 mg/dL — ABNORMAL HIGH (ref 0.44–1.00)
GFR calc Af Amer: 60 mL/min — ABNORMAL LOW (ref >60)
GFR calc non Af Amer: 52 mL/min — ABNORMAL LOW (ref >60)
GLUCOSE: 238 mg/dL — AB (ref 65–99)
POTASSIUM: 3.7 mmol/L (ref 3.5–5.1)
SODIUM: 138 mmol/L (ref 135–145)
Total Protein: 8.3 g/dL — ABNORMAL HIGH (ref 6.5–8.1)

## 2015-11-27 LAB — PROCALCITONIN: Procalcitonin: 0.31 ng/mL

## 2015-11-27 LAB — C DIFFICILE QUICK SCREEN W PCR REFLEX
C DIFFICILE (CDIFF) INTERP: NEGATIVE
C Diff antigen: NEGATIVE
C Diff toxin: NEGATIVE

## 2015-11-27 LAB — PROTIME-INR
INR: 1.02 (ref 0.00–1.49)
Prothrombin Time: 13.6 seconds (ref 11.6–15.2)

## 2015-11-27 LAB — APTT: APTT: 25 s (ref 24–37)

## 2015-11-27 LAB — LIPASE, BLOOD: Lipase: 47 U/L (ref 11–51)

## 2015-11-27 LAB — HCG, QUANTITATIVE, PREGNANCY: hCG, Beta Chain, Quant, S: 2 m[IU]/mL (ref <5)

## 2015-11-27 LAB — URINE MICROSCOPIC-ADD ON

## 2015-11-27 LAB — I-STAT CG4 LACTIC ACID, ED
LACTIC ACID, VENOUS: 4.13 mmol/L — AB (ref 0.5–2.0)
Lactic Acid, Venous: 6.34 mmol/L (ref 0.5–2.0)

## 2015-11-27 LAB — I-STAT BETA HCG BLOOD, ED (MC, WL, AP ONLY): HCG, QUANTITATIVE: 7.2 m[IU]/mL — AB (ref <5)

## 2015-11-27 LAB — D-DIMER, QUANTITATIVE: D-Dimer, Quant: >20 ug/mL-FEU — ABNORMAL HIGH (ref 0.00–0.50)

## 2015-11-27 LAB — I-STAT TROPONIN, ED: TROPONIN I, POC: 0.01 ng/mL (ref 0.00–0.08)

## 2015-11-27 LAB — LACTIC ACID, PLASMA: Lactic Acid, Venous: 2.5 mmol/L (ref 0.5–2.0)

## 2015-11-27 MED ORDER — SODIUM CHLORIDE 0.9 % IJ SOLN
3.0000 mL | Freq: Two times a day (BID) | INTRAMUSCULAR | Status: DC
  Administered 2015-11-27: 3 mL via INTRAVENOUS

## 2015-11-27 MED ORDER — ACETAMINOPHEN 650 MG RE SUPP: 650.0000 mg | Freq: Four times a day (QID) | RECTAL | Status: DC | PRN

## 2015-11-27 MED ORDER — MORPHINE SULFATE (PF) 2 MG/ML IV SOLN
2.0000 mg | INTRAVENOUS | Status: DC | PRN
  Administered 2015-11-27 – 2015-11-29 (×5): 2 mg via INTRAVENOUS
  Filled 2015-11-27 (×5): qty 1

## 2015-11-27 MED ORDER — LORATADINE 10 MG PO TABS
10.0000 mg | ORAL_TABLET | Freq: Every day | ORAL | Status: DC
  Administered 2015-11-27 – 2015-11-30 (×4): 10 mg via ORAL
  Filled 2015-11-27 (×4): qty 1

## 2015-11-27 MED ORDER — HYOSCYAMINE SULFATE 0.125 MG PO TABS
0.1250 mg | ORAL_TABLET | ORAL | Status: DC | PRN
  Filled 2015-11-27: qty 1

## 2015-11-27 MED ORDER — PROMETHAZINE HCL 25 MG/ML IJ SOLN
25.0000 mg | Freq: Once | INTRAMUSCULAR | Status: AC
  Administered 2015-11-27: 25 mg via INTRAVENOUS
  Filled 2015-11-27: qty 1

## 2015-11-27 MED ORDER — ALBUTEROL SULFATE HFA 108 (90 BASE) MCG/ACT IN AERS: 2.0000 | INHALATION_SPRAY | RESPIRATORY_TRACT | Status: DC | PRN

## 2015-11-27 MED ORDER — LEVOTHYROXINE SODIUM 100 MCG IV SOLR
37.5000 ug | Freq: Every day | INTRAVENOUS | Status: DC
  Administered 2015-11-28 – 2015-11-29 (×2): 37.5 ug via INTRAVENOUS
  Filled 2015-11-27 (×3): qty 5

## 2015-11-27 MED ORDER — FAMOTIDINE IN NACL 20-0.9 MG/50ML-% IV SOLN
20.0000 mg | Freq: Two times a day (BID) | INTRAVENOUS | Status: DC
  Administered 2015-11-27 – 2015-11-29 (×5): 20 mg via INTRAVENOUS
  Filled 2015-11-27 (×6): qty 50

## 2015-11-27 MED ORDER — HEPARIN SODIUM (PORCINE) 5000 UNIT/ML IJ SOLN
5000.0000 [IU] | Freq: Three times a day (TID) | INTRAMUSCULAR | Status: DC
  Administered 2015-11-27 – 2015-11-28 (×2): 5000 [IU] via SUBCUTANEOUS
  Filled 2015-11-27 (×5): qty 1

## 2015-11-27 MED ORDER — ALPRAZOLAM 0.25 MG PO TABS
0.2500 mg | ORAL_TABLET | Freq: Two times a day (BID) | ORAL | Status: DC | PRN
  Administered 2015-11-27 – 2015-11-29 (×4): 0.25 mg via ORAL
  Filled 2015-11-27 (×4): qty 1

## 2015-11-27 MED ORDER — ADULT MULTIVITAMIN W/MINERALS CH
1.0000 | ORAL_TABLET | Freq: Every day | ORAL | Status: DC
  Administered 2015-11-27 – 2015-11-30 (×4): 1 via ORAL
  Filled 2015-11-27 (×4): qty 1

## 2015-11-27 MED ORDER — SODIUM CHLORIDE 0.9 % IV BOLUS (SEPSIS)
1000.0000 mL | INTRAVENOUS | Status: AC
  Administered 2015-11-27 (×4): 1000 mL via INTRAVENOUS

## 2015-11-27 MED ORDER — DULOXETINE HCL 60 MG PO CPEP
60.0000 mg | ORAL_CAPSULE | Freq: Every day | ORAL | Status: DC
  Administered 2015-11-28 – 2015-11-30 (×3): 60 mg via ORAL
  Filled 2015-11-27 (×3): qty 1

## 2015-11-27 MED ORDER — ONDANSETRON HCL 4 MG/2ML IJ SOLN
4.0000 mg | Freq: Three times a day (TID) | INTRAMUSCULAR | Status: DC | PRN
  Administered 2015-11-27 – 2015-11-28 (×2): 4 mg via INTRAVENOUS
  Filled 2015-11-27 (×2): qty 2

## 2015-11-27 MED ORDER — PIPERACILLIN-TAZOBACTAM 3.375 G IVPB 30 MIN
3.3750 g | Freq: Once | INTRAVENOUS | Status: AC
  Administered 2015-11-27: 3.375 g via INTRAVENOUS
  Filled 2015-11-27: qty 50

## 2015-11-27 MED ORDER — ACETAMINOPHEN 325 MG PO TABS
650.0000 mg | ORAL_TABLET | Freq: Four times a day (QID) | ORAL | Status: DC | PRN
  Administered 2015-11-29: 650 mg via ORAL
  Filled 2015-11-27: qty 2

## 2015-11-27 MED ORDER — IOHEXOL 300 MG/ML  SOLN
25.0000 mL | Freq: Once | INTRAMUSCULAR | Status: AC | PRN
  Administered 2015-11-27: 50 mL via ORAL

## 2015-11-27 MED ORDER — SODIUM CHLORIDE 0.9 % IV SOLN
INTRAVENOUS | Status: DC
  Administered 2015-11-27: 100 mL/h via INTRAVENOUS
  Administered 2015-11-28 – 2015-11-30 (×5): via INTRAVENOUS

## 2015-11-27 MED ORDER — INSULIN ASPART 100 UNIT/ML ~~LOC~~ SOLN
0.0000 [IU] | Freq: Three times a day (TID) | SUBCUTANEOUS | Status: DC
Start: 2015-11-28 — End: 2015-11-30
  Administered 2015-11-28 – 2015-11-29 (×2): 1 [IU] via SUBCUTANEOUS
  Filled 2015-11-27: qty 1

## 2015-11-27 MED ORDER — PIPERACILLIN-TAZOBACTAM 3.375 G IVPB
3.3750 g | Freq: Three times a day (TID) | INTRAVENOUS | Status: DC
  Administered 2015-11-28 – 2015-11-30 (×7): 3.375 g via INTRAVENOUS
  Filled 2015-11-27 (×7): qty 50

## 2015-11-27 MED ORDER — ASPIRIN EC 81 MG PO TBEC
81.0000 mg | DELAYED_RELEASE_TABLET | Freq: Every day | ORAL | Status: DC
  Administered 2015-11-28 – 2015-11-30 (×3): 81 mg via ORAL
  Filled 2015-11-27 (×3): qty 1

## 2015-11-27 MED ORDER — SODIUM CHLORIDE 0.9 % IV BOLUS (SEPSIS)
1000.0000 mL | Freq: Once | INTRAVENOUS | Status: AC
  Administered 2015-11-27: 1000 mL via INTRAVENOUS

## 2015-11-27 MED ORDER — IOHEXOL 350 MG/ML SOLN
100.0000 mL | Freq: Once | INTRAVENOUS | Status: AC | PRN
  Administered 2015-11-27: 100 mL via INTRAVENOUS

## 2015-11-27 MED ORDER — MORPHINE SULFATE (PF) 4 MG/ML IV SOLN
4.0000 mg | Freq: Once | INTRAVENOUS | Status: AC
  Administered 2015-11-27: 4 mg via INTRAVENOUS
  Filled 2015-11-27: qty 1

## 2015-11-27 MED ORDER — SODIUM CHLORIDE 0.9 % IV BOLUS (SEPSIS)
1000.0000 mL | INTRAVENOUS | Status: AC
  Administered 2015-11-27 – 2015-11-28 (×4): 1000 mL via INTRAVENOUS

## 2015-11-27 NOTE — Progress Notes (Signed)
ANTIBIOTIC CONSULT NOTE - INITIAL  Pharmacy Consult for Zosyn Indication: Intra-abdominal infection  No Known Allergies  Patient Measurements:    Vital Signs: Temp: 96.7 F (35.9 C) (01/13 1606) Temp Source: Rectal (01/13 1606) BP: 65/55 mmHg (01/13 1606) Pulse Rate: 119 (01/13 1606) Intake/Output from previous day:   Intake/Output from this shift:    Labs: No results for input(s): WBC, HGB, PLT, LABCREA, CREATININE in the last 72 hours. CrCl cannot be calculated (Unknown ideal weight.). No results for input(s): VANCOTROUGH, VANCOPEAK, VANCORANDOM, GENTTROUGH, GENTPEAK, GENTRANDOM, TOBRATROUGH, TOBRAPEAK, TOBRARND, AMIKACINPEAK, AMIKACINTROU, AMIKACIN in the last 72 hours.   Microbiology: No results found for this or any previous visit (from the past 720 hour(s)).  Medical History: Past Medical History  Diagnosis Date  . Hypertension   . Thyroid disease   . Diabetes mellitus without complication ([place])   . Anxiety attack   . IBS (irritable bowel syndrome)   . PONV (postoperative nausea and vomiting)   . Diverticulitis     Medications:  Anti-infectives    Start     Dose/Rate Route Frequency Ordered Stop   11/27/15 1615  piperacillin-tazobactam (ZOSYN) IVPB 3.375 g     3.375 g 100 mL/hr over 30 Minutes Intravenous  Once 11/27/15 1610       Assessment: 55 y.o. female with PMH DM, IBS, hypothyroid, diverticulitis, presents 11/27/2015 with severe nonbloody diarrhea/emesis, abdominal cramping, and episodic SOB, diaphoresis, and dizziness.  Hypothermic and with elevated LA in ED; pharmacy to begin Zosyn for intra-abdominal infection.  Also with elevated D-dimer and hyperglycemia.  Pending workup for PE, DKA, Cdiff  1/13 >> Zosyn >>  1/13 blood: IP  Hypothermic WBC markedly elevated Renal: SCr elevated, most recent baseline 0.6; CrCl 72 CG, 62 N CT abd/chest pending   Goal of Therapy:  Eradication of infection Appropriate antibiotic dosing for indication and  renal function  Plan:  Day 1 antibiotics Zosyn 3.375 g IV given once over 30 minutes, then every 8 hrs by 4-hr infusion  Follow clinical course, renal function, culture results as available  Follow for de-escalation of antibiotics and LOT   Reuel Boom, PharmD, BCPS Pager: [phone] 11/27/2015, 4:31 PM

## 2015-11-27 NOTE — Consult Note (Signed)
Name: Kristin Bailey MRN: [id number] DOB: 1961-05-17    ADMISSION DATE:  11/27/2015 CONSULTATION DATE:  11/27/2015  REFERRING MD :  Chapman Moss Camprubi-Soms, PA / EDP  CHIEF COMPLAINT:  Sepsis with Lactic Acidosis  BRIEF PATIENT DESCRIPTION: 55 year old female with history of diabetes, hypertension, IBS, hypothyroidism, and diverticulitis status post cholecystectomy with sudden onset nausea with vomiting and diarrhea today. In the emergency department patient was found to have hypotension initially that resolved with fluid resuscitation as well as a lactic acidosis. Patient endorsed nonbloody emesis as well as near syncope during emesis.  SIGNIFICANT EVENTS  1/13 - Presented to ED  STUDIES:  PORT CXR 1/13:  Personally reviewed by me. No pleural effusion or opacity appreciated. Heart normal in size. Mediastinum normal in contour. ABD X-RAY 1 VIEW 1/13:  Per radiologist. Normal bowel gas and without obstruction or ileus.  HISTORY OF PRESENT ILLNESS:  Patient reports that immediately following Christmas with sick exposures she had 1 day of low-grade fever with myalgias, fatigue, abdominal cramping and nausea and vomiting 1. She denies any diarrhea during this episode. She reports she returned to her normal state of health until yesterday when she began to experience sore throat and significant sinus congestion and pressure. Patient also endorses pain in her "gums". Today after eating lunch approximately 12:30 PM which consisted of a ground beef taco with lettuce, tomato, and cheese as well as a stuffed chili pepper with ground beef and cheese, chips, DIP, and sweetened ice tea the patient began to experience significant abdominal cramping approximately 2 PM. This resulted in nausea and emesis 4 of previously ingested food without hematemesis. She experienced near syncope and diaphoresis during this episode as well as diffuse weakness. The patient began to have loose stools that progressed to  explosive watery diarrhea. She denies any melena or hematochezia. She reports the cramping is all over her abdomen. In the emergency room the patient thus far has been administered 5 L of normal saline IV as well as IV Zosyn. Pulmonary critical care was consulted given her elevated lactic acid and sepsis.  PAST MEDICAL HISTORY :  Past Medical History  Diagnosis Date  . Hypertension   . Thyroid disease   . Diabetes mellitus without complication ([place])   . Anxiety attack   . IBS (irritable bowel syndrome)   . PONV (postoperative nausea and vomiting)   . Diverticulitis   . Depression   . Arthritis     PAST SURGICAL HISTORY: Past Surgical History  Procedure Laterality Date  . Cesarean section    . Tubal ligation    . Cholecystectomy    . Fracture surgery      rt ankle   . Carpal tunnel release    . Cyst removal hand    . Knee surgery      meniscus repair  . Colonoscopy N/A 08/28/2014    Procedure: COLONOSCOPY;  Surgeon: Rogene Houston, MD;  Location: [facility];  Service: Endoscopy;  Laterality: N/A;  1030    Prior to Admission medications   Medication Sig Start Date End Date Taking? Authorizing Provider  acetaminophen (TYLENOL) 650 MG CR tablet Take 1,300 mg by mouth 2 (two) times daily as needed for pain.   Yes Historical Provider, MD  ALPRAZolam (XANAX) 0.25 MG tablet Take 0.25 mg by mouth 2 (two) times daily as needed for anxiety.   Yes Historical Provider, MD  aspirin EC 81 MG tablet Take 81 mg by mouth daily.   Yes Historical Provider,  MD  DULoxetine (CYMBALTA) 60 MG capsule Take 60 mg by mouth daily.   Yes Historical Provider, MD  glipiZIDE (GLUCOTROL) 5 MG tablet Take 5 mg by mouth 2 (two) times daily.    Yes Historical Provider, MD  levothyroxine (SYNTHROID, LEVOTHROID) 75 MCG tablet Take 75 mcg by mouth daily before breakfast.   Yes Historical Provider, MD  lisinopril-hydrochlorothiazide (PRINZIDE,ZESTORETIC) 20-12.5 MG per tablet Take 1 tablet by mouth daily.   Yes  Historical Provider, MD  loratadine (CLARITIN) 10 MG tablet Take 10 mg by mouth daily.   Yes Historical Provider, MD  Multiple Vitamin (MULTIVITAMIN WITH MINERALS) TABS tablet Take 1 tablet by mouth daily.   Yes Historical Provider, MD  omeprazole (PRILOSEC OTC) 20 MG tablet Take 1 tablet (20 mg total) by mouth daily. 08/30/15  Yes Carmin Muskrat, MD  albuterol (PROVENTIL HFA;VENTOLIN HFA) 108 (90 BASE) MCG/ACT inhaler Inhale 2 puffs into the lungs every 4 (four) hours as needed for wheezing or shortness of breath.    Historical Provider, MD  ciprofloxacin (CIPRO) 500 MG tablet Take 1 tablet (500 mg total) by mouth 2 (two) times daily. Patient not taking: Reported on 11/27/2015 08/26/15   Sharlett Iles, MD  Dulaglutide (TRULICITY) [id number] [id number] SOPN Inject 0.5 mLs into the skin once a week. On  Monday's.    Historical Provider, MD  hyoscyamine (LEVSIN, ANASPAZ) 0.125 MG tablet Take 0.125 mg by mouth every 4 (four) hours as needed for cramping.     Historical Provider, MD  metroNIDAZOLE (FLAGYL) 500 MG tablet Take 1 tablet (500 mg total) by mouth 2 (two) times daily. Patient not taking: Reported on 11/27/2015 08/26/15   Sharlett Iles, MD  ondansetron (ZOFRAN ODT) 4 MG disintegrating tablet Take 1 tablet (4 mg total) by mouth every 8 (eight) hours as needed for nausea or vomiting. 08/30/15   Carmin Muskrat, MD   No Known Allergies  FAMILY HISTORY:  Family History  Problem Relation Age of Onset  . Cancer Mother     colon cancer age74  . Hypertension Mother   . Stroke Mother   . Diabetes Mother   . Non-Hodgkin's lymphoma Father   . Hypertension Father   . Diabetes Father   . Kidney disease Paternal Aunt   . Cancer Paternal Aunt   . Kidney disease Paternal Uncle   . Cancer Paternal Uncle   . Stroke Maternal Grandmother     SOCIAL HISTORY: Social History   Social History  . Marital Status: Married    Spouse Name: N/A  . Number of Children: N/A  . Years of Education:  N/A   Occupational History  . Not on file.   Social History Main Topics  . Smoking status: Never Smoker   . Smokeless tobacco: Never Used  . Alcohol Use: No  . Drug Use: No  . Sexual Activity: Yes    Birth Control/ Protection: Surgical   Other Topics Concern  . Not on file   Social History Narrative   Works with special needs adults.    REVIEW OF SYSTEMS:  A pertinent 14 point review of systems is negative except as per the history of presenting illness. No rashes or abnormal bruising. No dysuria or hematuria.  VITAL SIGNS: Temp:  [96.7 F (35.9 C)] 96.7 F (35.9 C) (01/13 1606) Pulse Rate:  [101-127] 101 (01/13 1801) Resp:  [17-24] 18 (01/13 1609) BP: (65-127)/(44-72) 120/72 mmHg (01/13 1801) SpO2:  [87 %-100 %] 97 % (01/13 1801)  PHYSICAL EXAMINATION: General:  Awake. Alert. No acute distress. Obese Caucasian female. Husband at bedside.  Integument:  Warm & dry. No rash on exposed skin. No bruising. Lymphatics:  No appreciated cervical or supraclavicular lymphadenoapthy. HEENT:  Dry mucus membranes. No oral ulcers. No scleral injection or icterus.  Cardiovascular:  Regular rate. No edema. No appreciable JVD.  Pulmonary:  Good aeration & clear to auscultation bilaterally. Symmetric chest wall rise. No accessory muscle use on room air. Abdomen: Soft. Normal bowel sounds. Nondistended. Grossly nontender. Musculoskeletal:  Normal bulk and tone. No joint deformity or effusion appreciated. Neurological:  CN 2-12 grossly in tact. No meningismus. Moving all 4 extremities equally.  Psychiatric:  Mood and affect congruent. Speech normal rhythm, rate & tone.    Recent Labs Lab 11/27/15 1552  NA 138  K 3.7  CL 102  CO2 18*  BUN 19  CREATININE 1.18*  GLUCOSE 238*    Recent Labs Lab 11/27/15 1552  HGB 16.7*  HCT 51.5*  WBC 32.0*  PLT 434*   Dg Chest Portable 1 View  11/27/2015  CLINICAL DATA:  Weakness, short of breath, nausea and vomiting. EXAM: PORTABLE CHEST 1  VIEW COMPARISON:  CT 11/30/2013 FINDINGS: Normal mediastinum and cardiac silhouette. Normal pulmonary vasculature. No evidence of effusion, infiltrate, or pneumothorax. No acute bony abnormality. IMPRESSION: No acute cardiopulmonary process. Electronically Signed   By: Suzy Bouchard M.D.   On: 11/27/2015 17:19   Dg Abd Portable 1v  11/27/2015  CLINICAL DATA:  Generalized abdominal pain. EXAM: PORTABLE ABDOMEN - 1 VIEW COMPARISON:  Radiograph of August 30, 2015. CT scan of August 26, 2015. FINDINGS: The bowel gas pattern is normal. Status post cholecystectomy. No renal calculi are noted. IMPRESSION: No evidence bowel obstruction or ileus. Electronically Signed   By: Marijo Conception, M.D.   On: 11/27/2015 17:22   Ct Angio Chest Aorta W/cm &/or Wo/cm  11/27/2015  CLINICAL DATA:  Acute lower abdominal pain.  Diaphoresis. EXAM: CT ANGIOGRAPHY CHEST, ABDOMEN AND PELVIS TECHNIQUE: Multidetector CT imaging through the chest, abdomen and pelvis was performed using the standard protocol during bolus administration of intravenous contrast. Multiplanar reconstructed images and MIPs were obtained and reviewed to evaluate the vascular anatomy. CONTRAST:  120mL OMNIPAQUE IOHEXOL 350 MG/ML SOLN COMPARISON:  CT scan of August 26, 2015. FINDINGS: CTA CHEST FINDINGS No pneumothorax or pleural effusion is noted. No acute pulmonary disease is noted. There is no evidence of pulmonary embolus. There is no evidence of thoracic aortic dissection or aneurysm. Great vessels are widely patent. No mediastinal mass or adenopathy is noted. No significant osseous abnormality is noted. Review of the MIP images confirms the above findings. CTA ABDOMEN AND PELVIS FINDINGS Status post cholecystectomy. Probable fatty infiltration of the liver. The spleen and pancreas appear normal. Adrenal glands and kidneys appear normal. No hydronephrosis or renal obstruction is noted. The appendix appears normal. There is no evidence of bowel  obstruction. Urinary bladder appears normal. Uterus and ovaries are unremarkable. Mild amount of free fluid is in dependent portion of the pelvis which most likely is physiologic. Sigmoid diverticulosis is noted without inflammation. Mild wall thickening of the transverse and descending colon is noted suggesting possible infectious or inflammatory colitis. No significant adenopathy is noted. There is no evidence of abdominal aortic aneurysm or dissection. The mesenteric and renal arteries are widely patent without significant stenosis. Iliac arteries are widely patent without significant stenosis. Review of the MIP images confirms the above findings. IMPRESSION: No evidence of thoracic or abdominal aortic aneurysm or  dissection. No evidence of pulmonary embolus. Probable fatty infiltration of the liver. Mild wall thickening of the transverse and descending colon is noted suggesting possible infectious or inflammatory colitis. Sigmoid diverticulosis is noted without inflammation. Electronically Signed   By: Marijo Conception, M.D.   On: 11/27/2015 19:06   Ct Angio Abd/pel W/ And/or W/o  11/27/2015  CLINICAL DATA:  Acute lower abdominal pain.  Diaphoresis. EXAM: CT ANGIOGRAPHY CHEST, ABDOMEN AND PELVIS TECHNIQUE: Multidetector CT imaging through the chest, abdomen and pelvis was performed using the standard protocol during bolus administration of intravenous contrast. Multiplanar reconstructed images and MIPs were obtained and reviewed to evaluate the vascular anatomy. CONTRAST:  133mL OMNIPAQUE IOHEXOL 350 MG/ML SOLN COMPARISON:  CT scan of August 26, 2015. FINDINGS: CTA CHEST FINDINGS No pneumothorax or pleural effusion is noted. No acute pulmonary disease is noted. There is no evidence of pulmonary embolus. There is no evidence of thoracic aortic dissection or aneurysm. Great vessels are widely patent. No mediastinal mass or adenopathy is noted. No significant osseous abnormality is noted. Review of the MIP  images confirms the above findings. CTA ABDOMEN AND PELVIS FINDINGS Status post cholecystectomy. Probable fatty infiltration of the liver. The spleen and pancreas appear normal. Adrenal glands and kidneys appear normal. No hydronephrosis or renal obstruction is noted. The appendix appears normal. There is no evidence of bowel obstruction. Urinary bladder appears normal. Uterus and ovaries are unremarkable. Mild amount of free fluid is in dependent portion of the pelvis which most likely is physiologic. Sigmoid diverticulosis is noted without inflammation. Mild wall thickening of the transverse and descending colon is noted suggesting possible infectious or inflammatory colitis. No significant adenopathy is noted. There is no evidence of abdominal aortic aneurysm or dissection. The mesenteric and renal arteries are widely patent without significant stenosis. Iliac arteries are widely patent without significant stenosis. Review of the MIP images confirms the above findings. IMPRESSION: No evidence of thoracic or abdominal aortic aneurysm or dissection. No evidence of pulmonary embolus. Probable fatty infiltration of the liver. Mild wall thickening of the transverse and descending colon is noted suggesting possible infectious or inflammatory colitis. Sigmoid diverticulosis is noted without inflammation. Electronically Signed   By: Marijo Conception, M.D.   On: 11/27/2015 19:06    ASSESSMENT / PLAN:  55 year old female presenting with septic shock. Shock has resolved. Sepsis and lactic acidosis will likely improve his dehydration is corrected. I suspect the significant dye load administered by the emergency department has the potential to cause further renal injury on top of her acute renal failure but hopefully the fluid resuscitation will provide some protective effect. Potential sources include a viral etiology as well as food intoxication. At this time as the patient's blood pressure has stabilized and she appears  nontoxic I feel it's reasonable to admit her to a stepdown unit to the hospitalist service and made the emergency department aware of this.  1. Septic shock: Shock has resolved. Potential sources of sepsis primarily involve intra-abdominal infection. 2. Upper respiratory illness. Recommend screening for viral etiologies. 3. Intra-abdominal infection: Agree with empiric Zosyn for now. Recommend checking stool studies with cultures. 4. Lactic acidosis: Recommend continuing to trend lactic acid with further fluid replacement. 5. Acute renal failure: Recommend continuing to trend renal function as well as urine output in the setting of her significant dye load. Recommend avoiding further nephrotoxic agents.  Sonia Baller Ashok Cordia, M.D. [facility] Pager:  [phone] After 3pm or if no response, call [phone]  11/27/2015, 7:11 PM

## 2015-11-27 NOTE — ED Notes (Signed)
Notified [facility] - Lactic 6.34

## 2015-11-27 NOTE — ED Notes (Signed)
Pt is being seen by ICU MD

## 2015-11-27 NOTE — H&P (Addendum)
[facility] History and Physical  Kristin Bailey [id number] DOB: 17-Sep-1961 DOA: 11/27/2015  Referring physician: ED physician PCP: Rory Percy, MD  Specialists:   Chief Complaint: Nausea, vomiting, diarrhea  HPI: Kristin Bailey is a 55 y.o. female with PMH of diverticulitis, hypertension, diabetes mellitus, hypothyroidism, depression, IBS, arthritis, who presents with nausea, vomiting, diarrhea.  Patient reports that she started having sudden onset nausea, vomiting, diarrhea this PM. She vomited 3-5 times without blood in the vomitus. She has had 5 bowel movement with watery stool. She has abdominal cramping. She was found to have hypotension with SBP 82 by EMS. Patient has mild shortness of breath due to severe nausea and vomiting, but no chest pain or cough. She has diaphoresis, feeling cold and chills. She denies dysuria or burning on urination, but states that she always has increased urinary frequency. She also has suprapubic tenderness. Patient does not have rashes, unilateral weakness, hematochezia, hematemesis, hematuria.  In ED, patient was found to have blood pressure 65/55, which responded to IV fluid and improved to 120/72 after 2 L of normal saline bolus in the emergency room, lactate 6.34, WBCs 32, positive urinalysis for UTI, lipase of 47, positive d-dimer, hypothermia with temperature 96.7, tachycardia, tachypnea, acute renal injury with creatinine 1.18, negative chest x-ray. CT angiogram of chest is negative for PE and aortic dissection. CT abdomen/pelvis showed possible transverse colitis. Patient is admitted to inpatient for further eval and treatment. PCCM was consulted.  EKG: Independently reviewed.  QTC 452, tachycardia.  Where does patient live?   At home  Can patient participate in ADLs?  Yes  Review of Systems:   General: no fevers, has chills, no changes in body weight, has poor appetite, has fatigue HEENT: no blurry vision, hearing changes or sore  throat Pulm: no dyspnea, coughing, wheezing CV: no chest pain, palpitations Abd: has nausea, vomiting, abdominal pain, diarrhea, no constipation GU: no dysuria, burning on urination, increased urinary frequency, hematuria  Ext: no leg edema Neuro: no unilateral weakness, numbness, or tingling, no vision change or hearing loss Skin: no rash MSK: No muscle spasm, no deformity, no limitation of range of movement in spin Heme: No easy bruising.  Travel history: No recent long distant travel.  Allergy: No Known Allergies  Past Medical History  Diagnosis Date  . Hypertension   . Thyroid disease   . Diabetes mellitus without complication ([place])   . Anxiety attack   . IBS (irritable bowel syndrome)   . PONV (postoperative nausea and vomiting)   . Diverticulitis   . Depression   . Arthritis     Past Surgical History  Procedure Laterality Date  . Cesarean section    . Tubal ligation    . Cholecystectomy    . Fracture surgery      rt ankle   . Carpal tunnel release    . Cyst removal hand    . Knee surgery      meniscus repair  . Colonoscopy N/A 08/28/2014    Procedure: COLONOSCOPY;  Surgeon: Rogene Houston, MD;  Location: [facility];  Service: Endoscopy;  Laterality: N/A;  1030    Social History:  reports that she has never smoked. She has never used smokeless tobacco. She reports that she does not drink alcohol or use illicit drugs.  Family History:  Family History  Problem Relation Age of Onset  . Cancer Mother     colon cancer age74  . Hypertension Mother   . Stroke Mother   .  Diabetes Mother   . Non-Hodgkin's lymphoma Father   . Hypertension Father   . Diabetes Father   . Kidney disease Paternal Aunt   . Cancer Paternal Aunt   . Kidney disease Paternal Uncle   . Cancer Paternal Uncle   . Stroke Maternal Grandmother      Prior to Admission medications   Medication Sig Start Date End Date Taking? Authorizing Provider  acetaminophen (TYLENOL) 650 MG CR tablet  Take 1,300 mg by mouth 2 (two) times daily as needed for pain.   Yes Historical Provider, MD  ALPRAZolam (XANAX) 0.25 MG tablet Take 0.25 mg by mouth 2 (two) times daily as needed for anxiety.   Yes Historical Provider, MD  aspirin EC 81 MG tablet Take 81 mg by mouth daily.   Yes Historical Provider, MD  DULoxetine (CYMBALTA) 60 MG capsule Take 60 mg by mouth daily.   Yes Historical Provider, MD  glipiZIDE (GLUCOTROL) 5 MG tablet Take 5 mg by mouth 2 (two) times daily.    Yes Historical Provider, MD  levothyroxine (SYNTHROID, LEVOTHROID) 75 MCG tablet Take 75 mcg by mouth daily before breakfast.   Yes Historical Provider, MD  lisinopril-hydrochlorothiazide (PRINZIDE,ZESTORETIC) 20-12.5 MG per tablet Take 1 tablet by mouth daily.   Yes Historical Provider, MD  loratadine (CLARITIN) 10 MG tablet Take 10 mg by mouth daily.   Yes Historical Provider, MD  Multiple Vitamin (MULTIVITAMIN WITH MINERALS) TABS tablet Take 1 tablet by mouth daily.   Yes Historical Provider, MD  omeprazole (PRILOSEC OTC) 20 MG tablet Take 1 tablet (20 mg total) by mouth daily. 08/30/15  Yes Carmin Muskrat, MD  albuterol (PROVENTIL HFA;VENTOLIN HFA) 108 (90 BASE) MCG/ACT inhaler Inhale 2 puffs into the lungs every 4 (four) hours as needed for wheezing or shortness of breath.    Historical Provider, MD  ciprofloxacin (CIPRO) 500 MG tablet Take 1 tablet (500 mg total) by mouth 2 (two) times daily. Patient not taking: Reported on 11/27/2015 08/26/15   Sharlett Iles, MD  Dulaglutide (TRULICITY) [id number] [id number] SOPN Inject 0.5 mLs into the skin once a week. On  Monday's.    Historical Provider, MD  hyoscyamine (LEVSIN, ANASPAZ) 0.125 MG tablet Take 0.125 mg by mouth every 4 (four) hours as needed for cramping.     Historical Provider, MD  metroNIDAZOLE (FLAGYL) 500 MG tablet Take 1 tablet (500 mg total) by mouth 2 (two) times daily. Patient not taking: Reported on 11/27/2015 08/26/15   Sharlett Iles, MD  ondansetron  (ZOFRAN ODT) 4 MG disintegrating tablet Take 1 tablet (4 mg total) by mouth every 8 (eight) hours as needed for nausea or vomiting. 08/30/15   Carmin Muskrat, MD    Physical Exam: Filed Vitals:   11/27/15 1800 11/27/15 1801 11/27/15 1952 11/27/15 2000  BP:  120/72 141/82 136/69  Pulse: 103 101 97 99  Temp:   97.9 F (36.6 C)   TempSrc:   Oral   Resp:   18   SpO2: 87% 97% 96% 97%   General: Not in acute distress HEENT:       Eyes: PERRL, EOMI, no scleral icterus.       ENT: No discharge from the ears and nose, no pharynx injection, no tonsillar enlargement.        Neck: No JVD, no bruit, no mass felt. Heme: No neck lymph node enlargement. Cardiac: S1/S2, RRR, No murmurs, No gallops or rubs. Pulm:  No rales, wheezing, rhonchi or rubs. Abd: Soft, nondistended, mild tenderness over  lower abdomen, no rebound pain, no organomegaly, BS present. Ext: No pitting leg edema bilaterally. 2+DP/PT pulse bilaterally. Musculoskeletal: No joint deformities, No joint redness or warmth, no limitation of ROM in spin. Skin: No rashes.  Neuro: Alert, oriented X3, cranial nerves II-XII grossly intact, muscle strength 5/5 in all extremities, sensation to light touch intact.  Psych: Patient is not psychotic, no suicidal or hemocidal ideation.  Labs on Admission:  Basic Metabolic Panel:  Recent Labs Lab 11/27/15 1552  NA 138  K 3.7  CL 102  CO2 18*  GLUCOSE 238*  BUN 19  CREATININE 1.18*  CALCIUM 9.6   Liver Function Tests:  Recent Labs Lab 11/27/15 1552  AST 48*  ALT 47  ALKPHOS 90  BILITOT 0.8  PROT 8.3*  ALBUMIN 4.3    Recent Labs Lab 11/27/15 1552  LIPASE 47   No results for input(s): AMMONIA in the last 168 hours. CBC:  Recent Labs Lab 11/27/15 1552  WBC 32.0*  NEUTROABS 27.2*  HGB 16.7*  HCT 51.5*  MCV 85.4  PLT 434*   Cardiac Enzymes: No results for input(s): CKTOTAL, CKMB, CKMBINDEX, TROPONINI in the last 168 hours.  BNP (last 3 results) No results for  input(s): BNP in the last 8760 hours.  ProBNP (last 3 results) No results for input(s): PROBNP in the last 8760 hours.  CBG: No results for input(s): GLUCAP in the last 168 hours.  Radiological Exams on Admission: Dg Chest Portable 1 View  11/27/2015  CLINICAL DATA:  Weakness, short of breath, nausea and vomiting. EXAM: PORTABLE CHEST 1 VIEW COMPARISON:  CT 11/30/2013 FINDINGS: Normal mediastinum and cardiac silhouette. Normal pulmonary vasculature. No evidence of effusion, infiltrate, or pneumothorax. No acute bony abnormality. IMPRESSION: No acute cardiopulmonary process. Electronically Signed   By: Suzy Bouchard M.D.   On: 11/27/2015 17:19   Dg Abd Portable 1v  11/27/2015  CLINICAL DATA:  Generalized abdominal pain. EXAM: PORTABLE ABDOMEN - 1 VIEW COMPARISON:  Radiograph of August 30, 2015. CT scan of August 26, 2015. FINDINGS: The bowel gas pattern is normal. Status post cholecystectomy. No renal calculi are noted. IMPRESSION: No evidence bowel obstruction or ileus. Electronically Signed   By: Marijo Conception, M.D.   On: 11/27/2015 17:22   Ct Angio Chest Aorta W/cm &/or Wo/cm  11/27/2015  CLINICAL DATA:  Acute lower abdominal pain.  Diaphoresis. EXAM: CT ANGIOGRAPHY CHEST, ABDOMEN AND PELVIS TECHNIQUE: Multidetector CT imaging through the chest, abdomen and pelvis was performed using the standard protocol during bolus administration of intravenous contrast. Multiplanar reconstructed images and MIPs were obtained and reviewed to evaluate the vascular anatomy. CONTRAST:  123mL OMNIPAQUE IOHEXOL 350 MG/ML SOLN COMPARISON:  CT scan of August 26, 2015. FINDINGS: CTA CHEST FINDINGS No pneumothorax or pleural effusion is noted. No acute pulmonary disease is noted. There is no evidence of pulmonary embolus. There is no evidence of thoracic aortic dissection or aneurysm. Great vessels are widely patent. No mediastinal mass or adenopathy is noted. No significant osseous abnormality is noted. Review  of the MIP images confirms the above findings. CTA ABDOMEN AND PELVIS FINDINGS Status post cholecystectomy. Probable fatty infiltration of the liver. The spleen and pancreas appear normal. Adrenal glands and kidneys appear normal. No hydronephrosis or renal obstruction is noted. The appendix appears normal. There is no evidence of bowel obstruction. Urinary bladder appears normal. Uterus and ovaries are unremarkable. Mild amount of free fluid is in dependent portion of the pelvis which most likely is physiologic. Sigmoid diverticulosis is  noted without inflammation. Mild wall thickening of the transverse and descending colon is noted suggesting possible infectious or inflammatory colitis. No significant adenopathy is noted. There is no evidence of abdominal aortic aneurysm or dissection. The mesenteric and renal arteries are widely patent without significant stenosis. Iliac arteries are widely patent without significant stenosis. Review of the MIP images confirms the above findings. IMPRESSION: No evidence of thoracic or abdominal aortic aneurysm or dissection. No evidence of pulmonary embolus. Probable fatty infiltration of the liver. Mild wall thickening of the transverse and descending colon is noted suggesting possible infectious or inflammatory colitis. Sigmoid diverticulosis is noted without inflammation. Electronically Signed   By: Marijo Conception, M.D.   On: 11/27/2015 19:06   Ct Angio Abd/pel W/ And/or W/o  11/27/2015  CLINICAL DATA:  Acute lower abdominal pain.  Diaphoresis. EXAM: CT ANGIOGRAPHY CHEST, ABDOMEN AND PELVIS TECHNIQUE: Multidetector CT imaging through the chest, abdomen and pelvis was performed using the standard protocol during bolus administration of intravenous contrast. Multiplanar reconstructed images and MIPs were obtained and reviewed to evaluate the vascular anatomy. CONTRAST:  119mL OMNIPAQUE IOHEXOL 350 MG/ML SOLN COMPARISON:  CT scan of August 26, 2015. FINDINGS: CTA CHEST  FINDINGS No pneumothorax or pleural effusion is noted. No acute pulmonary disease is noted. There is no evidence of pulmonary embolus. There is no evidence of thoracic aortic dissection or aneurysm. Great vessels are widely patent. No mediastinal mass or adenopathy is noted. No significant osseous abnormality is noted. Review of the MIP images confirms the above findings. CTA ABDOMEN AND PELVIS FINDINGS Status post cholecystectomy. Probable fatty infiltration of the liver. The spleen and pancreas appear normal. Adrenal glands and kidneys appear normal. No hydronephrosis or renal obstruction is noted. The appendix appears normal. There is no evidence of bowel obstruction. Urinary bladder appears normal. Uterus and ovaries are unremarkable. Mild amount of free fluid is in dependent portion of the pelvis which most likely is physiologic. Sigmoid diverticulosis is noted without inflammation. Mild wall thickening of the transverse and descending colon is noted suggesting possible infectious or inflammatory colitis. No significant adenopathy is noted. There is no evidence of abdominal aortic aneurysm or dissection. The mesenteric and renal arteries are widely patent without significant stenosis. Iliac arteries are widely patent without significant stenosis. Review of the MIP images confirms the above findings. IMPRESSION: No evidence of thoracic or abdominal aortic aneurysm or dissection. No evidence of pulmonary embolus. Probable fatty infiltration of the liver. Mild wall thickening of the transverse and descending colon is noted suggesting possible infectious or inflammatory colitis. Sigmoid diverticulosis is noted without inflammation. Electronically Signed   By: Marijo Conception, M.D.   On: 11/27/2015 19:06    Assessment/Plan Principal Problem:   Septic shock (HCC) Active Problems:   Hypertension   Diabetes mellitus without complication ([place])   Anxiety attack   Depression   Arthritis   UTI (lower urinary  tract infection)   Colitis   AKI (acute kidney injury) ([place])  Septic shock ([place]): This is most likely due to combination of colitis and UTI. Patient's blood pressure responded to IV fluid resuscitation. Currently she is hemodynamically stable. PCCM evaluated pt, recommended iv zosyn and screening for viral etiologies.  -will admit patient to the stepdown bed -Appreciated PCCM recommendations -Respiratory virus panel C. Difficile, GI pathogen panel -IV Zosyn -will get Procalcitonin and trend lactic acid levels per sepsis protocol. -IVF: 3.0 L of NS bolus in ED, followed by 100 cc/h -Bair hugger  Addendum: FOBT came  back positive-->will switch sq Heparin to SCD for DVT PPx.  Hypertension: -Hold Bp meds now  DM-II: Last A1c not on record, Patient is taking glipizide and Tulicity at home -SSI -Check A1c  UTI (lower urinary tract infection): -On Zosyn -f/u Bx and Ux  AKI: Likely due to prerenal secondary to dehydration and possible ANT due to septic shock.and continuation of ACEI, diruetics - IVF as above - Check FeUrea - Follow up renal function by BMP - Hold prinzid  Depression and anxiety: Stable, no suicidal or homicidal ideations. -Continue home medications: Xanax, Cymbalta  Hypothyroidism: Last TSH was not on record -switch home Synthroid to IV, cut dose by half -Check TSH  GERD: -will switch PPI to pepcid IV until C diff pcr negative   DVT ppx: SCD  Code Status: Full code Family Communication: None at bed side.  Disposition Plan: Admit to inpatient   Date of Service 11/27/2015    Ivor Costa [facility] Pager [phone]  If 7PM-7AM, please contact night-coverage [email or web address] Password [facility] 11/27/2015, 10:11 PM

## 2015-11-27 NOTE — ED Notes (Signed)
Helped pt to bathroom Pt did not have any BM

## 2015-11-27 NOTE — Progress Notes (Signed)
[facility] consulted regarding medication assistance.  EDCM spoke to patient at bedside.  Patient listed as living in [place].  Patient confirms her pcp is Dr. Rory Percy.  EDCM spoke to patient at bedside.  Patient confirms she does not have insurance.   Patient reports she uses [facility] for her pharmacy and confirms some of her medications are on their discounted list.  She reports she is currently receiving assistance Trulicity and cymbalta through [employer].  She reports she has just started a new job and has received a letter from the benefits department but is not sure if her benefits have been approved.  No further EDCM needs at this time.

## 2015-11-27 NOTE — ED Provider Notes (Signed)
CSN: [id number]     Arrival date & time 11/27/15  1502 History   First MD Initiated Contact with Patient 11/27/15 1531     Chief Complaint  Patient presents with  . Emesis  . Diarrhea     (Consider location/radiation/quality/duration/timing/severity/associated sxs/prior Treatment) HPI Comments: Kristin Bailey is a 55 y.o. female with a PMHx of HTN, DM2, IBS, hypothyroidism, and diverticulitis, with a PSHx of cholecystectomy, tubal ligation, and remote C-section, who presents to the ED with complaints of sudden onset nausea vomiting and diarrhea that started at 2 PM. Patient states that she had just eaten lunch, she was at [place] when she had a sudden urge to use the restroom. Upon having a bowel movement, she noted that it was very loose and watery, and then she developed some nausea and abdominal pain. She describes the abdominal pain is 9/10 generalized abdominal cramping, intermittent, nonradiating, with no known aggravating factors, relieved slightly with having a bowel movement, with no specific treatments tried prior to arrival. Associated symptoms include 3-4 episodes of nonbloody diarrhea, nausea, 3-4 episodes of nonbloody nonbilious emesis, lightheadedness that occurred during vomiting, diaphoresis, and shortness of breath that occurred during vomiting and then has continued.  She denies any fevers, chills, chest pain, leg swelling, recent travel/surgery/immobilization, personal history of DVT/PE, estrogen use, orthopnea, claudication hematemesis, melena, hematochezia, obstipation, constipation, dysuria, hematuria, vaginal bleeding or discharge, numbness, tingling, weakness, recent travel, sick contacts, suspicious food intake, alcohol use, NSAID use, or antibiotics. She is a nonsmoker. Positive family history of DVT in both parents.  Patient is a 55 y.o. female presenting with vomiting, diarrhea, and abdominal pain. The history is provided by the patient and medical records. No  language interpreter was used.  Emesis Associated symptoms: abdominal pain and diarrhea   Associated symptoms: no arthralgias, no chills and no myalgias   Diarrhea Associated symptoms: abdominal pain, diaphoresis and vomiting   Associated symptoms: no arthralgias, no chills, no fever and no myalgias   Abdominal Pain Pain location:  Generalized Pain quality: cramping   Pain radiates to:  Does not radiate Pain severity:  Moderate Onset quality:  Sudden Duration:  2 hours Timing:  Intermittent Progression:  Waxing and waning Chronicity:  Recurrent Context: not recent travel, not sick contacts and not suspicious food intake   Relieved by:  Bowel activity Worsened by:  Nothing tried Ineffective treatments:  None tried Associated symptoms: diarrhea, nausea, shortness of breath (while vomiting, ongoing) and vomiting   Associated symptoms: no chest pain, no chills, no constipation, no dysuria, no fever, no flatus, no hematemesis, no hematochezia, no hematuria, no melena, no vaginal bleeding and no vaginal discharge   Risk factors: obesity   Risk factors: no alcohol abuse and no NSAID use     Past Medical History  Diagnosis Date  . Hypertension   . Thyroid disease   . Diabetes mellitus without complication ([place])   . Anxiety attack   . IBS (irritable bowel syndrome)   . PONV (postoperative nausea and vomiting)   . Diverticulitis    Past Surgical History  Procedure Laterality Date  . Cesarean section    . Tubal ligation    . Cholecystectomy    . Fracture surgery      rt ankle   . Carpal tunnel release    . Cyst removal hand    . Knee surgery    . Colonoscopy N/A 08/28/2014    Procedure: COLONOSCOPY;  Surgeon: Rogene Houston, MD;  Location: [facility];  Service: Endoscopy;  Laterality: N/A;  1030   Family History  Problem Relation Age of Onset  . Cancer Mother     colon cancer age74  . Hypertension Mother   . Stroke Mother   . Diabetes Mother   . Cancer Father   .  Hypertension Father   . Diabetes Father   . Kidney disease Paternal Aunt   . Cancer Paternal Aunt   . Kidney disease Paternal Uncle   . Cancer Paternal Uncle   . Stroke Maternal Grandmother    Social History  Substance Use Topics  . Smoking status: Never Smoker   . Smokeless tobacco: Never Used  . Alcohol Use: No   OB History    Gravida Para Term Preterm AB TAB SAB Ectopic Multiple Living   1 1 1       1      Review of Systems  Constitutional: Positive for diaphoresis. Negative for fever and chills.  Respiratory: Positive for shortness of breath (while vomiting, ongoing).   Cardiovascular: Negative for chest pain and leg swelling.  Gastrointestinal: Positive for nausea, vomiting, abdominal pain and diarrhea. Negative for constipation, blood in stool, melena, hematochezia, flatus and hematemesis.  Genitourinary: Negative for dysuria, hematuria, vaginal bleeding and vaginal discharge.  Musculoskeletal: Negative for myalgias and arthralgias.  Skin: Negative for color change.  Allergic/Immunologic: Positive for immunocompromised state (diabetic).  Neurological: Positive for light-headedness (while vomiting/diarrhea). Negative for syncope, weakness and numbness.  Psychiatric/Behavioral: Negative for confusion.   10 Systems reviewed and are negative for acute change except as noted in the HPI.    Allergies  Review of patient's allergies indicates no known allergies.  Home Medications   Prior to Admission medications   Medication Sig Start Date End Date Taking? Authorizing Provider  acetaminophen (TYLENOL) 650 MG CR tablet Take 1,300 mg by mouth 2 (two) times daily as needed for pain.    Historical Provider, MD  albuterol (PROVENTIL HFA;VENTOLIN HFA) 108 (90 BASE) MCG/ACT inhaler Inhale 2 puffs into the lungs every 4 (four) hours as needed for wheezing or shortness of breath.    Historical Provider, MD  ALPRAZolam Duanne Moron) 0.25 MG tablet Take 0.25 mg by mouth 2 (two) times daily as  needed for anxiety.    Historical Provider, MD  aspirin EC 81 MG tablet Take 81 mg by mouth daily.    Historical Provider, MD  ciprofloxacin (CIPRO) 500 MG tablet Take 1 tablet (500 mg total) by mouth 2 (two) times daily. 08/26/15   Sharlett Iles, MD  Dulaglutide (TRULICITY) 5.63 SL/3.7DS SOPN Inject 0.5 mLs into the skin once a week. On  Monday's.    Historical Provider, MD  DULoxetine (CYMBALTA) 60 MG capsule Take 60 mg by mouth daily.    Historical Provider, MD  glipiZIDE (GLUCOTROL) 5 MG tablet Take 5 mg by mouth 2 (two) times daily.     Historical Provider, MD  hyoscyamine (LEVSIN, ANASPAZ) 0.125 MG tablet Take 0.125 mg by mouth every 4 (four) hours as needed for cramping.     Historical Provider, MD  levothyroxine (SYNTHROID, LEVOTHROID) 75 MCG tablet Take 75 mcg by mouth daily before breakfast.    Historical Provider, MD  lisinopril-hydrochlorothiazide (PRINZIDE,ZESTORETIC) 20-12.5 MG per tablet Take 1 tablet by mouth daily.    Historical Provider, MD  loratadine (CLARITIN) 10 MG tablet Take 10 mg by mouth daily.    Historical Provider, MD  metroNIDAZOLE (FLAGYL) 500 MG tablet Take 1 tablet (500 mg total) by mouth 2 (two) times  daily. 08/26/15   Sharlett Iles, MD  Multiple Vitamin (MULTIVITAMIN WITH MINERALS) TABS tablet Take 1 tablet by mouth daily.    Historical Provider, MD  omeprazole (PRILOSEC OTC) 20 MG tablet Take 1 tablet (20 mg total) by mouth daily. 08/30/15   Carmin Muskrat, MD  ondansetron (ZOFRAN ODT) 4 MG disintegrating tablet Take 1 tablet (4 mg total) by mouth every 8 (eight) hours as needed for nausea or vomiting. 08/30/15   Carmin Muskrat, MD  Probiotic Product (PROBIOTIC DAILY PO) Take 1 capsule by mouth daily.    Historical Provider, MD   BP 98/44 mmHg  Pulse 127  Resp 17  SpO2 100% Physical Exam  Constitutional: She is oriented to person, place, and time. She appears well-developed and well-nourished.  Non-toxic appearance. She appears distressed  (uncomfortable).  Rectal temp 96.7, nontoxic, uncomfortable appearing. Mildly tachycardic and BP soft in the 90/40s range  HENT:  Head: Normocephalic and atraumatic.  Mouth/Throat: Oropharynx is clear and moist. Mucous membranes are dry.  Very dry mucous membranes  Eyes: Conjunctivae and EOM are normal. Right eye exhibits no discharge. Left eye exhibits no discharge.  Neck: Normal range of motion. Neck supple.  Cardiovascular: Regular rhythm, normal heart sounds and intact distal pulses.  Tachycardia present.  Exam reveals no gallop and no friction rub.   No murmur heard. Tachycardic, reg rhythm, nl s1/s2, no m/r/g, distal pulses intact, no pedal edema   Pulmonary/Chest: Effort normal and breath sounds normal. No respiratory distress. She has no decreased breath sounds. She has no wheezes. She has no rhonchi. She has no rales.  CTAB in all lung fields, no w/r/r, no hypoxia or increased WOB, speaking in full sentences, SpO2 100% on RA   Abdominal: Soft. Normal appearance and bowel sounds are normal. She exhibits no distension. There is generalized tenderness. There is tenderness at McBurney's point. There is no rigidity, no rebound, no guarding, no CVA tenderness and negative Murphy's sign.    Soft, obese but nondistended, +BS throughout, with generalized TTP but most focally in the RLQ, no r/g/r, neg murphy's, +mcburney's, no CVA TTP   Musculoskeletal: Normal range of motion.  MAE x4 Strength and sensation grossly intact Distal pulses intact No pedal edema, neg homan's bilaterally   Neurological: She is alert and oriented to person, place, and time. She has normal strength. No sensory deficit.  Skin: Skin is warm, dry and intact. No rash noted.  Psychiatric: She has a normal mood and affect.  Nursing note and vitals reviewed.   ED Course  Procedures (including critical care time)  CRITICAL CARE Performed by: Corine Shelter   Total critical care time: 90  minutes  Critical care time was exclusive of separately billable procedures and treating other patients.  Critical care was necessary to treat or prevent imminent or life-threatening deterioration.  Critical care was time spent personally by me on the following activities: development of treatment plan with patient and/or surrogate as well as nursing, discussions with consultants, evaluation of patient's response to treatment, examination of patient, obtaining history from patient or surrogate, ordering and performing treatments and interventions, ordering and review of laboratory studies, ordering and review of radiographic studies, pulse oximetry and re-evaluation of patient's condition.   Labs Review Labs Reviewed  COMPREHENSIVE METABOLIC PANEL - Abnormal; Notable for the following:    CO2 18 (*)    Glucose, Bld 238 (*)    Creatinine, Ser 1.18 (*)    Total Protein 8.3 (*)    AST  48 (*)    GFR calc non Af Amer 52 (*)    GFR calc Af Amer 60 (*)    Anion gap 18 (*)    All other components within normal limits  URINALYSIS, ROUTINE W REFLEX MICROSCOPIC (NOT AT [facility]) - Abnormal; Notable for the following:    APPearance CLOUDY (*)    Glucose, UA 100 (*)    Protein, ur 100 (*)    Leukocytes, UA SMALL (*)    All other components within normal limits  CBC WITH DIFFERENTIAL/PLATELET - Abnormal; Notable for the following:    WBC 32.0 (*)    RBC 6.03 (*)    Hemoglobin 16.7 (*)    HCT 51.5 (*)    Platelets 434 (*)    Neutro Abs 27.2 (*)    All other components within normal limits  D-DIMER, QUANTITATIVE (NOT AT [facility]) - Abnormal; Notable for the following:    D-Dimer, Quant >20.00 (*)    All other components within normal limits  URINE MICROSCOPIC-ADD ON - Abnormal; Notable for the following:    Squamous Epithelial / LPF 6-30 (*)    Bacteria, UA MANY (*)    All other components within normal limits  I-STAT BETA HCG BLOOD, ED ([facility], [facility], [facility] ONLY) - Abnormal; Notable for the following:     I-stat hCG, quantitative 7.2 (*)    All other components within normal limits  I-STAT CG4 LACTIC ACID, ED - Abnormal; Notable for the following:    Lactic Acid, Venous 6.34 (*)    All other components within normal limits  GASTROINTESTINAL PANEL BY PCR, STOOL (REPLACES STOOL CULTURE)  C DIFFICILE QUICK SCREEN W PCR REFLEX  CULTURE, BLOOD (ROUTINE X 2)  CULTURE, BLOOD (ROUTINE X 2)  URINE CULTURE  LIPASE, BLOOD  I-STAT TROPOININ, ED  POC OCCULT BLOOD, ED  I-STAT CG4 LACTIC ACID, ED    Imaging Review Dg Chest Portable 1 View  11/27/2015  CLINICAL DATA:  Weakness, short of breath, nausea and vomiting. EXAM: PORTABLE CHEST 1 VIEW COMPARISON:  CT 11/30/2013 FINDINGS: Normal mediastinum and cardiac silhouette. Normal pulmonary vasculature. No evidence of effusion, infiltrate, or pneumothorax. No acute bony abnormality. IMPRESSION: No acute cardiopulmonary process. Electronically Signed   By: Suzy Bouchard M.D.   On: 11/27/2015 17:19   Dg Abd Portable 1v  11/27/2015  CLINICAL DATA:  Generalized abdominal pain. EXAM: PORTABLE ABDOMEN - 1 VIEW COMPARISON:  Radiograph of August 30, 2015. CT scan of August 26, 2015. FINDINGS: The bowel gas pattern is normal. Status post cholecystectomy. No renal calculi are noted. IMPRESSION: No evidence bowel obstruction or ileus. Electronically Signed   By: Marijo Conception, M.D.   On: 11/27/2015 17:22   Ct Angio Chest Aorta W/cm &/or Wo/cm  11/27/2015  CLINICAL DATA:  Acute lower abdominal pain.  Diaphoresis. EXAM: CT ANGIOGRAPHY CHEST, ABDOMEN AND PELVIS TECHNIQUE: Multidetector CT imaging through the chest, abdomen and pelvis was performed using the standard protocol during bolus administration of intravenous contrast. Multiplanar reconstructed images and MIPs were obtained and reviewed to evaluate the vascular anatomy. CONTRAST:  149m OMNIPAQUE IOHEXOL 350 MG/ML SOLN COMPARISON:  CT scan of August 26, 2015. FINDINGS: CTA CHEST FINDINGS No pneumothorax or  pleural effusion is noted. No acute pulmonary disease is noted. There is no evidence of pulmonary embolus. There is no evidence of thoracic aortic dissection or aneurysm. Great vessels are widely patent. No mediastinal mass or adenopathy is noted. No significant osseous abnormality is noted. Review of the MIP images  confirms the above findings. CTA ABDOMEN AND PELVIS FINDINGS Status post cholecystectomy. Probable fatty infiltration of the liver. The spleen and pancreas appear normal. Adrenal glands and kidneys appear normal. No hydronephrosis or renal obstruction is noted. The appendix appears normal. There is no evidence of bowel obstruction. Urinary bladder appears normal. Uterus and ovaries are unremarkable. Mild amount of free fluid is in dependent portion of the pelvis which most likely is physiologic. Sigmoid diverticulosis is noted without inflammation. Mild wall thickening of the transverse and descending colon is noted suggesting possible infectious or inflammatory colitis. No significant adenopathy is noted. There is no evidence of abdominal aortic aneurysm or dissection. The mesenteric and renal arteries are widely patent without significant stenosis. Iliac arteries are widely patent without significant stenosis. Review of the MIP images confirms the above findings. IMPRESSION: No evidence of thoracic or abdominal aortic aneurysm or dissection. No evidence of pulmonary embolus. Probable fatty infiltration of the liver. Mild wall thickening of the transverse and descending colon is noted suggesting possible infectious or inflammatory colitis. Sigmoid diverticulosis is noted without inflammation. Electronically Signed   By: Marijo Conception, M.D.   On: 11/27/2015 19:06   Ct Angio Abd/pel W/ And/or W/o  11/27/2015  CLINICAL DATA:  Acute lower abdominal pain.  Diaphoresis. EXAM: CT ANGIOGRAPHY CHEST, ABDOMEN AND PELVIS TECHNIQUE: Multidetector CT imaging through the chest, abdomen and pelvis was performed  using the standard protocol during bolus administration of intravenous contrast. Multiplanar reconstructed images and MIPs were obtained and reviewed to evaluate the vascular anatomy. CONTRAST:  178m OMNIPAQUE IOHEXOL 350 MG/ML SOLN COMPARISON:  CT scan of August 26, 2015. FINDINGS: CTA CHEST FINDINGS No pneumothorax or pleural effusion is noted. No acute pulmonary disease is noted. There is no evidence of pulmonary embolus. There is no evidence of thoracic aortic dissection or aneurysm. Great vessels are widely patent. No mediastinal mass or adenopathy is noted. No significant osseous abnormality is noted. Review of the MIP images confirms the above findings. CTA ABDOMEN AND PELVIS FINDINGS Status post cholecystectomy. Probable fatty infiltration of the liver. The spleen and pancreas appear normal. Adrenal glands and kidneys appear normal. No hydronephrosis or renal obstruction is noted. The appendix appears normal. There is no evidence of bowel obstruction. Urinary bladder appears normal. Uterus and ovaries are unremarkable. Mild amount of free fluid is in dependent portion of the pelvis which most likely is physiologic. Sigmoid diverticulosis is noted without inflammation. Mild wall thickening of the transverse and descending colon is noted suggesting possible infectious or inflammatory colitis. No significant adenopathy is noted. There is no evidence of abdominal aortic aneurysm or dissection. The mesenteric and renal arteries are widely patent without significant stenosis. Iliac arteries are widely patent without significant stenosis. Review of the MIP images confirms the above findings. IMPRESSION: No evidence of thoracic or abdominal aortic aneurysm or dissection. No evidence of pulmonary embolus. Probable fatty infiltration of the liver. Mild wall thickening of the transverse and descending colon is noted suggesting possible infectious or inflammatory colitis. Sigmoid diverticulosis is noted without  inflammation. Electronically Signed   By: JMarijo Conception M.D.   On: 11/27/2015 19:06   I have personally reviewed and evaluated these images and lab results as part of my medical decision-making.  PRIOR EXAMS:  Colonoscopy 08/2014- Impression:  Examination performed to cecum. Moderate sigmoid colon diverticulosis and external hemorrhoids. No evidence of colonic polyps. Recommendations:  Standard instructions given. High-fiber diet. Will review prior colonoscopy records and determine timing of next colonoscopy. REHMAN,NAJEEB  U 08/28/2014 10:57 AM    EKG Interpretation   Date/Time:  Friday November 27 2015 16:05:23 EST Ventricular Rate:  118 PR Interval:  135 QRS Duration: 85 QT Interval:  323 QTC Calculation: 452 R Axis:   78 Text Interpretation:  Sinus tachycardia Probable left atrial enlargement  Minimal ST depression, inferior leads Confirmed by Alvino Chapel  MD, Ovid Curd  [phone]) on 11/27/2015 4:13:40 PM      MDM   Final diagnoses:  UTI (lower urinary tract infection)  Nausea vomiting and diarrhea  Sepsis, due to unspecified organism (HCC)  Hypotension, unspecified hypotension type  Lactic acidosis  Tachycardia  Colitis    55 y.o. female here with sudden onset N/V/D and diaphoresis/lightheadedness/SOB after n/v/d episodes began. Tachycardic, mildly hypotensive, appears uncomfortable although not quite septic appearing, no hypoxia or LE swelling but given tachycardia, PERC+, and given SOB complaint will obtain dimer. More likely the diaphoresis/lightheadedness/SOB is from vasovagal response, but still warrants eval. Will get trop, lactic, EKG, and labs. On exam, generalized abd tenderness but most focally in the RLQ, concerning for appendicitis. Will obtain CT scan which will also help eval for diverticulitis. Will give more nausea meds since zofran given en route didn't help. Will give fluids and pain meds. Will obtain rectal temp. With next BM will hopefully collect  stool for PCR studies and c.diff PCR. Will reassess shortly.   4:11 PM Lactic 6.34. Rectal temp noted to be 96.7. Will apply bair hugger, and call code sepsis. Will add on blood cultures, urine culture, and more fluids per weight-based fluid dosing. Will also add on CXR.  4:35 PM Dr. Alvino Chapel saw pt, would like to hold off on CT abd/pelvis until we evaluate if we need CT chest, changed to abd 1V portable. Changed CXR to portable 1V as well. Added on Istat chem8 to see if we can get labs faster. So far, lactic 6.34, HCG 7.2 (pt with tubal ligation, doubt pregnancy), and trop neg. EKG with sinus tachy and minimal ST depression inferiorly. Rest of labs pending. Will reassess shortly.   4:45 PM CMP resulting, showing Bicarb slightly low at 18, gluc 238, Cr 1.18, AST 48 but ALT and bili/alk phos normal, anion gap 18 which is likely lactic acidosis but can't r/o possibility of DKA given elevated gluc/low bicarb. Cancelled Chem8 since this resulted. CBC w/diff showing leukocytosis of 32 with neutrophilic predominance, H/H concentrated, plt 434. Dimer still pending. Xrays not yet performed. Monitoring closely. BP remaining stable in the 80s/60s at this time. Pt mentating appropriately.   5:11 PM Dimer >20. Pt's case discussed with critical care, they are aware of pt. Fluids still running. Xrays not yet on file, but were obtained. Will see with radiology what the delay is. Will go ahead and order CT angio chest/abd/pelvis to r/o ischemic bowel vs dissection vs other etiology  6:05 PM BPs improving in the 110s/70s range, HR improving down to the low 100s. Chest and abd xrays negative. Awaiting CT angios. U/A pending. Will reassess shortly.   6:56 PM U/A showing TNTC WBC and many bacteria, which could be her source of infection, zosyn should cover for this. No ketones, doubt DKA, anion gap likely from lactic acidosis. CT angios in process. BP improved to 120s/70s, HR down to 101. Will send for repeat lactic  since it's been 3hrs. CCM team in to see pt at this time  7:23 PM CCM stating she is stable to go to triad for admission. CT chest without evidence of dissection or  PE. Mild wall thickening in transverse and descending colon could indicate infectious or inflammatory colitis. No diverticulitis noted. Will proceed with admission to [facility]. Pt feeling much better at this time, taking PO.  HR 99, BP stable in the 120s/70s.  7:52 PM Dr. Blaine Hamper returning page, will admit to stepdown. Please see his notes for further documentation of care.  BP 120/72 mmHg  Pulse 101  Temp(Src) 96.7 F (35.9 C) (Rectal)  Resp 18  SpO2 97%  Meds ordered this encounter  Medications  . promethazine (PHENERGAN) injection 25 mg    Sig:   . sodium chloride 0.9 % bolus 1,000 mL    Sig:   . morphine 4 MG/ML injection 4 mg    Sig:   . iohexol (OMNIPAQUE) 300 MG/ML solution 25 mL    Sig:   . sodium chloride 0.9 % bolus 1,000 mL    Sig:     Order Specific Question:  Enter Patient Weight in Kilograms    Answer:  125  . piperacillin-tazobactam (ZOSYN) IVPB 3.375 g    Sig:     Order Specific Question:  Antibiotic Indication:    Answer:  Intra-abdominal Infection  . piperacillin-tazobactam (ZOSYN) IVPB 3.375 g    Sig:     Order Specific Question:  Antibiotic Indication:    Answer:  Intra-abdominal Infection  . iohexol (OMNIPAQUE) 350 MG/ML injection 100 mL    Sig:      Mairyn Lenahan Camprubi-Soms, PA-C 11/27/15 1952  Davonna Belling, MD 11/28/15 6067  Davonna Belling, MD 11/28/15 7034

## 2015-11-27 NOTE — ED Notes (Signed)
Patient transported to CT 

## 2015-11-27 NOTE — ED Notes (Signed)
Per [facility] - pt w/ acute onset of n/v/d and diaphoresis while in a store - pt given 4mg  IVP zofran and 524ml bolus en route d/t SBP of 82 palpated. Pt continues to be diaphoretic on arrival to department and expresses need to have another episode of diarrhea.

## 2015-11-28 ENCOUNTER — Encounter (HOSPITAL_COMMUNITY): Payer: Self-pay | Admitting: Internal Medicine

## 2015-11-28 DIAGNOSIS — K529 Noninfective gastroenteritis and colitis, unspecified: Secondary | ICD-10-CM

## 2015-11-28 DIAGNOSIS — F329 Major depressive disorder, single episode, unspecified: Secondary | ICD-10-CM

## 2015-11-28 DIAGNOSIS — N390 Urinary tract infection, site not specified: Secondary | ICD-10-CM

## 2015-11-28 DIAGNOSIS — E119 Type 2 diabetes mellitus without complications: Secondary | ICD-10-CM

## 2015-11-28 DIAGNOSIS — D72829 Elevated white blood cell count, unspecified: Secondary | ICD-10-CM | POA: Diagnosis present

## 2015-11-28 DIAGNOSIS — J9601 Acute respiratory failure with hypoxia: Secondary | ICD-10-CM | POA: Diagnosis present

## 2015-11-28 LAB — COMPREHENSIVE METABOLIC PANEL
ALBUMIN: 3.3 g/dL — AB (ref 3.5–5.0)
ALT: 34 U/L (ref 14–54)
AST: 28 U/L (ref 15–41)
Alkaline Phosphatase: 73 U/L (ref 38–126)
Anion gap: 11 (ref 5–15)
BUN: 13 mg/dL (ref 6–20)
CHLORIDE: 112 mmol/L — AB (ref 101–111)
CO2: 20 mmol/L — ABNORMAL LOW (ref 22–32)
Calcium: 7.7 mg/dL — ABNORMAL LOW (ref 8.9–10.3)
Creatinine, Ser: 0.69 mg/dL (ref 0.44–1.00)
GFR calc Af Amer: >60 mL/min (ref >60)
GLUCOSE: 135 mg/dL — AB (ref 65–99)
POTASSIUM: 3.9 mmol/L (ref 3.5–5.1)
Sodium: 143 mmol/L (ref 135–145)
Total Bilirubin: 0.7 mg/dL (ref 0.3–1.2)
Total Protein: 6.2 g/dL — ABNORMAL LOW (ref 6.5–8.1)

## 2015-11-28 LAB — GASTROINTESTINAL PANEL BY PCR, STOOL (REPLACES STOOL CULTURE)
ADENOVIRUS F40/41: NOT DETECTED
Astrovirus: NOT DETECTED
CAMPYLOBACTER SPECIES: NOT DETECTED
CRYPTOSPORIDIUM: NOT DETECTED
Cyclospora cayetanensis: NOT DETECTED
E. coli O157: NOT DETECTED
ENTEROAGGREGATIVE E COLI (EAEC): NOT DETECTED
ENTEROPATHOGENIC E COLI (EPEC): NOT DETECTED
Entamoeba histolytica: NOT DETECTED
Enterotoxigenic E coli (ETEC): NOT DETECTED
Giardia lamblia: NOT DETECTED
NOROVIRUS GI/GII: NOT DETECTED
PLESIMONAS SHIGELLOIDES: NOT DETECTED
ROTAVIRUS A: NOT DETECTED
SHIGA LIKE TOXIN PRODUCING E COLI (STEC): NOT DETECTED
SHIGELLA/ENTEROINVASIVE E COLI (EIEC): NOT DETECTED
Salmonella species: NOT DETECTED
Sapovirus (I, II, IV, and V): NOT DETECTED
Vibrio cholerae: NOT DETECTED
Vibrio species: NOT DETECTED
YERSINIA ENTEROCOLITICA: NOT DETECTED

## 2015-11-28 LAB — GLUCOSE, CAPILLARY
GLUCOSE-CAPILLARY: 116 mg/dL — AB (ref 65–99)
Glucose-Capillary: 107 mg/dL — ABNORMAL HIGH (ref 65–99)

## 2015-11-28 LAB — CREATININE, URINE, RANDOM: Creatinine, Urine: 38.58 mg/dL

## 2015-11-28 LAB — TSH: TSH: 2.409 u[IU]/mL (ref 0.350–4.500)

## 2015-11-28 LAB — CBC
HEMATOCRIT: 37.7 % (ref 36.0–46.0)
HEMOGLOBIN: 12.1 g/dL (ref 12.0–15.0)
MCH: 27.2 pg (ref 26.0–34.0)
MCHC: 32.1 g/dL (ref 30.0–36.0)
MCV: 84.7 fL (ref 78.0–100.0)
Platelets: 293 10*3/uL (ref 150–400)
RBC: 4.45 MIL/uL (ref 3.87–5.11)
RDW: 14.4 % (ref 11.5–15.5)
WBC: 21.8 10*3/uL — AB (ref 4.0–10.5)

## 2015-11-28 LAB — CBG MONITORING, ED
GLUCOSE-CAPILLARY: 129 mg/dL — AB (ref 65–99)
GLUCOSE-CAPILLARY: 137 mg/dL — AB (ref 65–99)
Glucose-Capillary: 117 mg/dL — ABNORMAL HIGH (ref 65–99)
Glucose-Capillary: 96 mg/dL (ref 65–99)

## 2015-11-28 LAB — LACTIC ACID, PLASMA: Lactic Acid, Venous: 2 mmol/L (ref 0.5–2.0)

## 2015-11-28 LAB — POC OCCULT BLOOD, ED: FECAL OCCULT BLD: POSITIVE — AB

## 2015-11-28 MED ORDER — HYOSCYAMINE SULFATE 0.125 MG PO TABS
0.1250 mg | ORAL_TABLET | ORAL | Status: DC | PRN
  Administered 2015-11-28 – 2015-11-30 (×4): 0.125 mg via ORAL
  Filled 2015-11-28 (×5): qty 1

## 2015-11-28 MED ORDER — ALBUTEROL SULFATE (2.5 MG/3ML) 0.083% IN NEBU: 2.5000 mg | INHALATION_SOLUTION | RESPIRATORY_TRACT | Status: DC | PRN

## 2015-11-28 NOTE — ED Notes (Addendum)
Pt. Assisted to bathroom for toileting, pt. Had loose,bloody stool.RN.Lilibeth made aware.

## 2015-11-28 NOTE — ED Notes (Signed)
Pt given food tray.

## 2015-11-28 NOTE — Progress Notes (Signed)
Patient ID: Kristin Bailey, female   DOB: 1960-11-15, 55 y.o.   MRN: [id number] [facility] PROGRESS NOTE  Kristin Bailey [id number] DOB: [birth year] DOA: 11/27/2015 PCP: Rory Percy, MD  Brief narrative:    55 y.o. female with past medical history of diverticulitis, hypertension, diabetes mellitus, depression who presented with nausea, vomiting, diarrhea.for past day prior to this admission. She had no blood in emesis. She did have some blood with diarrhea. She also had abdominal cramping. No fevers but she did have chills. No GU complaints.   In ED, patient was found to have blood pressure 65/55, which responded to 2 L IV fluid. Blood work showed lctate of 6.34, WBCs 32, UA showed small leukocytes and many bacteria. CT angio gram showed possible colitis. She was seen by CCM. She was started on empiric zosyn.  Assessment/Plan:    Principal Problem:   Septic shock ([place]) / Colitis / UTI (lower urinary tract infection) / Leukocytosis - Septic shock on admission with hypothermia, tachycardia, tachypnea, hypoxia, hypotension of 65/55, lactic acid of 6.34, WBC count 32 - Source of infection colitis and /or uti - CT angio chest concerning for possible inflammatory colitis - On zosyn - CCM consulted - Blood cultures so far show no growth  - GI pathogen panel unremarkable  - Hemodynamically stable at this time   Active Problems:   Acute respiratory failure with hypoxia (HCC) - No signs of pneumonia - Now stable and 98% on room air    Depression - Continue Cymbalta    AKI (acute kidney injury) ([place]) - Due to sepsis, lisinopril  - Lisinopril on hold - Renal function now NWL    Controlled type 2 diabetes mellitus without complication, without long-term current use of insulin (HCC) - A1c is pending - Continue SSI   DVT Prophylaxis  - SCD's bilaterally   Code Status: Full.  Family Communication:  plan of care discussed with the patient Disposition Plan: Home once diarrhea  improves and once pt tolerates regular diet.   IV access:  Peripheral IV  Procedures and diagnostic studies:    Dg Chest Portable 1 View 11/27/2015  No acute cardiopulmonary process. Electronically Signed   By: Suzy Bouchard M.D.   On: 11/27/2015 17:19   Dg Abd Portable 1v 11/27/2015  No evidence bowel obstruction or ileus. Electronically Signed   By: Marijo Conception, M.D.   On: 11/27/2015 17:22   Ct Angio Chest Aorta W/cm &/or Wo/cm 11/27/2015 No evidence of thoracic or abdominal aortic aneurysm or dissection. No evidence of pulmonary embolus. Probable fatty infiltration of the liver. Mild wall thickening of the transverse and descending colon is noted suggesting possible infectious or inflammatory colitis. Sigmoid diverticulosis is noted without inflammation. Electronically Signed   By: Marijo Conception, M.D.   On: 11/27/2015 19:06   Ct Angio Abd/pel W/ And/or W/o 11/27/2015  No evidence of thoracic or abdominal aortic aneurysm or dissection. No evidence of pulmonary embolus. Probable fatty infiltration of the liver. Mild wall thickening of the transverse and descending colon is noted suggesting possible infectious or inflammatory colitis. Sigmoid diverticulosis is noted without inflammation. Electronically Signed   By: Marijo Conception, M.D.   On: 11/27/2015 19:06   Medical Consultants:  CCM  Other Consultants:  None   IAnti-Infectives:   Zosyn 11/27/2015 -->   Leisa Lenz, MD  [facility] Pager [phone]  Time spent in minutes: 25 minutes  If 7PM-7AM, please contact night-coverage [email or web address] Password [email or web address] 11/28/2015, 3:48 PM  LOS: 1 day    HPI/Subjective: No acute overnight events. Patient reports still diarrhea but pain better.   Objective: Filed Vitals:   11/28/15 0830 11/28/15 0845 11/28/15 1351 11/28/15 1444  BP: 116/55 110/61 118/55 123/57  Pulse:  93 91 98  Temp:   97.8 F (36.6 C) 97.8 F (36.6 C)  TempSrc:   Oral Oral  Resp:   16 18  Height:    5'  6" (1.676 m)  Weight:    277 lb 12.5 oz (126 kg)  SpO2:  99% 97% 98%    Intake/Output Summary (Last 24 hours) at 11/28/15 1548 Last data filed at 11/28/15 0601  Gross per 24 hour  Intake   4000 ml  Output      5 ml  Net   3995 ml    Exam:   General:  Pt is alert, follows commands appropriately, not in acute distress  Cardiovascular: Regular rate and rhythm, S1/S2, no murmurs  Respiratory: Clear to auscultation bilaterally, no wheezing, no crackles, no rhonchi  Abdomen: Soft, non tender, non distended, bowel sounds present  Extremities: No edema, pulses DP and PT palpable bilaterally  Neuro: Grossly nonfocal  Data Reviewed: Basic Metabolic Panel:  Recent Labs Lab 11/27/15 1552 11/28/15 0448  NA 138 143  K 3.7 3.9  CL 102 112*  CO2 18* 20*  GLUCOSE 238* 135*  BUN 19 13  CREATININE 1.18* 0.69  CALCIUM 9.6 7.7*   Liver Function Tests:  Recent Labs Lab 11/27/15 1552 11/28/15 0448  AST 48* 28  ALT 47 34  ALKPHOS 90 73  BILITOT 0.8 0.7  PROT 8.3* 6.2*  ALBUMIN 4.3 3.3*    Recent Labs Lab 11/27/15 1552  LIPASE 47   No results for input(s): AMMONIA in the last 168 hours. CBC:  Recent Labs Lab 11/27/15 1552 11/28/15 0448  WBC 32.0* 21.8*  NEUTROABS 27.2*  --   HGB 16.7* 12.1  HCT 51.5* 37.7  MCV 85.4 84.7  PLT 434* 293   Cardiac Enzymes: No results for input(s): CKTOTAL, CKMB, CKMBINDEX, TROPONINI in the last 168 hours. BNP: Invalid input(s): POCBNP CBG:  Recent Labs Lab 11/28/15 0314 11/28/15 0732 11/28/15 1116 11/28/15 1349  GLUCAP 129* 137* 117* 96    Recent Results (from the past 240 hour(s))  Blood Culture (routine x 2)     Status: None (Preliminary result)   Collection Time: 11/27/15  4:12 PM  Result Value Ref Range Status   Specimen Description BLOOD RIGHT ARM  Final   Special Requests BOTTLES DRAWN AEROBIC AND ANAEROBIC 5CC  Final   Culture   Final    NO GROWTH < 24 HOURS Performed at [facility]    Report  Status PENDING  Incomplete  Urine culture     Status: None (Preliminary result)   Collection Time: 11/27/15  5:54 PM  Result Value Ref Range Status   Specimen Description URINE, CLEAN CATCH  Final   Special Requests NONE  Final   Culture   Final    TOO YOUNG TO READ Performed at [facility]    Report Status PENDING  Incomplete  C difficile quick scan w PCR reflex     Status: None   Collection Time: 11/27/15  9:06 PM  Result Value Ref Range Status   C Diff antigen NEGATIVE NEGATIVE Final   C Diff toxin NEGATIVE NEGATIVE Final   C Diff interpretation Negative for toxigenic C. difficile  Final     Scheduled Meds: .  aspirin EC  81 mg Oral Daily  . DULoxetine  60 mg Oral Daily  . famotidine (PEPCID) IV  20 mg Intravenous Q12H  . insulin aspart  0-9 Units Subcutaneous TID WC  . levothyroxine  37.5 mcg Intravenous Daily  . loratadine  10 mg Oral Daily  . multivitamin with minerals  1 tablet Oral Daily  . piperacillin-tazobactam (ZOSYN)  IV  3.375 g Intravenous Q8H  . sodium chloride  3 mL Intravenous Q12H   Continuous Infusions: . sodium chloride 100 mL/hr at 11/28/15 1516

## 2015-11-29 LAB — UREA NITROGEN, URINE: UREA NITROGEN UR: 276 mg/dL

## 2015-11-29 LAB — GLUCOSE, CAPILLARY
GLUCOSE-CAPILLARY: 94 mg/dL (ref 65–99)
GLUCOSE-CAPILLARY: 139 mg/dL — AB (ref 65–99)
Glucose-Capillary: 114 mg/dL — ABNORMAL HIGH (ref 65–99)
Glucose-Capillary: 138 mg/dL — ABNORMAL HIGH (ref 65–99)

## 2015-11-29 LAB — URINE CULTURE

## 2015-11-29 NOTE — Progress Notes (Signed)
Utilization review completed.  

## 2015-11-29 NOTE — Care Management Note (Signed)
Case Management Note  Patient Details  Name: Kristin Bailey MRN: [id number] Date of Birth: 1961/03/14  Subjective/Objective:                  Nausea, vomiting, diarrhea  Action/Plan: CM spoke with the patient at the bedside. Patient reports she has a new job and is unsure when her insurance will be effective. She gets her medications filled at [facility]. She is aware of the $4 medications list. CM will continue to follow for needs at discharge.   Expected Discharge Date:   (unknown)               Expected Discharge Plan:     In-House Referral:     Discharge planning Services  CM Consult, Medication Assistance  Post Acute Care Choice:    Choice offered to:     DME Arranged:    DME Agency:     HH Arranged:    HH Agency:     Status of Service:  In process, will continue to follow  Medicare Important Message Given:    Date Medicare IM Given:    Medicare IM give by:    Date Additional Medicare IM Given:    Additional Medicare Important Message give by:     If discussed at [place] of Stay Meetings, dates discussed:    Additional Comments:  Apolonio Schneiders, RN 11/29/2015, 11:10 AM

## 2015-11-29 NOTE — Progress Notes (Signed)
Patient ID: Kristin Bailey, female   DOB: 05-19-1961, 55 y.o.   MRN: [id number] [facility] PROGRESS NOTE  ILEENE FLESZAR [id number] DOB: 07-17-1961 DOA: 11/27/2015 PCP: Rory Percy, MD  Brief narrative:    55 y.o. female with past medical history of diverticulitis, hypertension, diabetes mellitus, depression who presented with nausea, vomiting, diarrhea.for past day prior to this admission. She had no blood in emesis. She did have some blood with diarrhea. She also had abdominal cramping. No fevers but she did have chills. No GU complaints.   In ED, patient was found to have blood pressure 65/55, which responded to 2 L IV fluid. Blood work showed lctate of 6.34, WBCs 32, UA showed small leukocytes and many bacteria. CT angio gram showed possible colitis. She was seen by CCM. She was started on empiric zosyn.  Assessment/Plan:    Principal Problem:   Septic shock ([place]) / Colitis / UTI (lower urinary tract infection) / Leukocytosis - Septic shock on admission with hypothermia, tachycardia, tachypnea, hypoxia, hypotension of 65/55, lactic acid of 6.34, WBC count 32 - Source of infection presumably colitis and /or uti - CT angio chest concerning for possible inflammatory colitis - Continue zosyn  - Diet as tolerated  - Blood cultures so far show no growth  - GI pathogen panel unremarkable   Active Problems:   Acute respiratory failure with hypoxia (HCC) - No signs of pneumonia - Stable on room air     Depression - Continue Cymbalta - Stable - Not depressed     AKI (acute kidney injury) ([place]) - Due to sepsis, lisinopril. Lisinopril held  - Renal function now NWL    Controlled type 2 diabetes mellitus without complication, without long-term current use of insulin (HCC) - A1c is pending as of 11/28/2015  - Continue SSI   DVT Prophylaxis  - SCD's bilaterally   Code Status: Full.  Family Communication:  plan of care discussed with the patient Disposition Plan: Home  once able to tolerate regular diet. Possibly by 12/01/2015  IV access:  Peripheral IV  Procedures and diagnostic studies:    Dg Chest Portable 1 View 11/27/2015  No acute cardiopulmonary process. Electronically Signed   By: Suzy Bouchard M.D.   On: 11/27/2015 17:19   Dg Abd Portable 1v 11/27/2015  No evidence bowel obstruction or ileus. Electronically Signed   By: Marijo Conception, M.D.   On: 11/27/2015 17:22   Ct Angio Chest Aorta W/cm &/or Wo/cm 11/27/2015 No evidence of thoracic or abdominal aortic aneurysm or dissection. No evidence of pulmonary embolus. Probable fatty infiltration of the liver. Mild wall thickening of the transverse and descending colon is noted suggesting possible infectious or inflammatory colitis. Sigmoid diverticulosis is noted without inflammation. Electronically Signed   By: Marijo Conception, M.D.   On: 11/27/2015 19:06   Ct Angio Abd/pel W/ And/or W/o 11/27/2015  No evidence of thoracic or abdominal aortic aneurysm or dissection. No evidence of pulmonary embolus. Probable fatty infiltration of the liver. Mild wall thickening of the transverse and descending colon is noted suggesting possible infectious or inflammatory colitis. Sigmoid diverticulosis is noted without inflammation. Electronically Signed   By: Marijo Conception, M.D.   On: 11/27/2015 19:06   Medical Consultants:  CCM  Other Consultants:  None   IAnti-Infectives:   Zosyn 11/27/2015 -->   Leisa Lenz, MD  [facility] Pager [phone]  Time spent in minutes: 25 minutes  If 7PM-7AM, please contact night-coverage [email or web address] Password [facility] 11/29/2015,  12:52 PM   LOS: 2 days    HPI/Subjective: No acute overnight events. Patient reports no diarrhea.   Objective: Filed Vitals:   11/28/15 1351 11/28/15 1444 11/28/15 2255 11/29/15 0507  BP: 118/55 123/57 122/59 98/61  Pulse: 91 98 89 82  Temp: 97.8 F (36.6 C) 97.8 F (36.6 C) 98.8 F (37.1 C) 98.6 F (37 C)  TempSrc: Oral Oral Oral  Oral  Resp: 16 18 18 20   Height:  5\' 6"  (1.676 m)    Weight:  277 lb 12.5 oz (126 kg)    SpO2: 97% 98% 98% 98%    Intake/Output Summary (Last 24 hours) at 11/29/15 1252 Last data filed at 11/29/15 0945  Gross per 24 hour  Intake 2183.33 ml  Output      0 ml  Net 2183.33 ml    Exam:   General:  Pt is alert, not in acute distress  Cardiovascular: RRR, S1/S2 (+)  Respiratory: no wheezing, no crackles, no rhonchi  Abdomen: tender in mid abdomen, (+) BS  Extremities: No leg swelling, palpable pulses   Neuro: Nonfocal  Data Reviewed: Basic Metabolic Panel:  Recent Labs Lab 11/27/15 1552 11/28/15 0448  NA 138 143  K 3.7 3.9  CL 102 112*  CO2 18* 20*  GLUCOSE 238* 135*  BUN 19 13  CREATININE 1.18* 0.69  CALCIUM 9.6 7.7*   Liver Function Tests:  Recent Labs Lab 11/27/15 1552 11/28/15 0448  AST 48* 28  ALT 47 34  ALKPHOS 90 73  BILITOT 0.8 0.7  PROT 8.3* 6.2*  ALBUMIN 4.3 3.3*    Recent Labs Lab 11/27/15 1552  LIPASE 47   No results for input(s): AMMONIA in the last 168 hours. CBC:  Recent Labs Lab 11/27/15 1552 11/28/15 0448  WBC 32.0* 21.8*  NEUTROABS 27.2*  --   HGB 16.7* 12.1  HCT 51.5* 37.7  MCV 85.4 84.7  PLT 434* 293   Cardiac Enzymes: No results for input(s): CKTOTAL, CKMB, CKMBINDEX, TROPONINI in the last 168 hours. BNP: Invalid input(s): POCBNP CBG:  Recent Labs Lab 11/28/15 1349 11/28/15 1654 11/28/15 2300 11/29/15 0744 11/29/15 1202  GLUCAP 96 107* 116* 94 114*    Recent Results (from the past 240 hour(s))  Blood Culture (routine x 2)     Status: None (Preliminary result)   Collection Time: 11/27/15  4:12 PM  Result Value Ref Range Status   Specimen Description BLOOD RIGHT ARM  Final   Special Requests BOTTLES DRAWN AEROBIC AND ANAEROBIC 5CC  Final   Culture   Final    NO GROWTH < 24 HOURS Performed at [facility]    Report Status PENDING  Incomplete  Urine culture     Status: None   Collection Time:  11/27/15  5:54 PM  Result Value Ref Range Status   Specimen Description URINE, CLEAN CATCH  Final   Special Requests NONE  Final   Culture   Final    MULTIPLE SPECIES PRESENT, SUGGEST RECOLLECTION Performed at [facility]    Report Status 11/29/2015 FINAL  Final  Gastrointestinal Panel by PCR , Stool     Status: None   Collection Time: 11/27/15  9:06 PM  Result Value Ref Range Status   Campylobacter species NOT DETECTED NOT DETECTED Final   Plesimonas shigelloides NOT DETECTED NOT DETECTED Final   Salmonella species NOT DETECTED NOT DETECTED Final   Yersinia enterocolitica NOT DETECTED NOT DETECTED Final   Vibrio species NOT DETECTED NOT DETECTED Final  Vibrio cholerae NOT DETECTED NOT DETECTED Final   Enteroaggregative E coli (EAEC) NOT DETECTED NOT DETECTED Final   Enteropathogenic E coli (EPEC) NOT DETECTED NOT DETECTED Final   Enterotoxigenic E coli (ETEC) NOT DETECTED NOT DETECTED Final   Shiga like toxin producing E coli (STEC) NOT DETECTED NOT DETECTED Final   E. coli O157 NOT DETECTED NOT DETECTED Final   Shigella/Enteroinvasive E coli (EIEC) NOT DETECTED NOT DETECTED Final   Cryptosporidium NOT DETECTED NOT DETECTED Final   Cyclospora cayetanensis NOT DETECTED NOT DETECTED Final   Entamoeba histolytica NOT DETECTED NOT DETECTED Final   Giardia lamblia NOT DETECTED NOT DETECTED Final   Adenovirus F40/41 NOT DETECTED NOT DETECTED Final   Astrovirus NOT DETECTED NOT DETECTED Final   Norovirus GI/GII NOT DETECTED NOT DETECTED Final   Rotavirus A NOT DETECTED NOT DETECTED Final   Sapovirus (I, II, IV, and V) NOT DETECTED NOT DETECTED Final  C difficile quick scan w PCR reflex     Status: None   Collection Time: 11/27/15  9:06 PM  Result Value Ref Range Status   C Diff antigen NEGATIVE NEGATIVE Final   C Diff toxin NEGATIVE NEGATIVE Final   C Diff interpretation Negative for toxigenic C. difficile  Final     Scheduled Meds: . aspirin EC  81 mg Oral Daily  .  DULoxetine  60 mg Oral Daily  . famotidine (PEPCID) IV  20 mg Intravenous Q12H  . insulin aspart  0-9 Units Subcutaneous TID WC  . levothyroxine  37.5 mcg Intravenous Daily  . loratadine  10 mg Oral Daily  . multivitamin with minerals  1 tablet Oral Daily  . piperacillin-tazobactam (ZOSYN)  IV  3.375 g Intravenous Q8H  . sodium chloride  3 mL Intravenous Q12H   Continuous Infusions: . sodium chloride 100 mL/hr at 11/29/15 [phone]

## 2015-11-29 NOTE — Progress Notes (Signed)
PT Cancellation/Screen Note  Patient Details Name: Kristin Bailey MRN: [id number] DOB: 1961/06/02   Cancelled Treatment:    Reason Eval/Treat Not Completed: PT screened, no needs identified, will sign off .Spoke with pt who reports she has been up walking in hallways with family. Pt denies need for PT services at this time. Will sign off. Thanks.    Weston Anna, MPT Pager: [phone]

## 2015-11-30 LAB — CBC
HEMATOCRIT: 35.9 % — AB (ref 36.0–46.0)
Hemoglobin: 11.4 g/dL — ABNORMAL LOW (ref 12.0–15.0)
MCH: 27.3 pg (ref 26.0–34.0)
MCHC: 31.8 g/dL (ref 30.0–36.0)
MCV: 86.1 fL (ref 78.0–100.0)
Platelets: 273 10*3/uL (ref 150–400)
RBC: 4.17 MIL/uL (ref 3.87–5.11)
RDW: 14.5 % (ref 11.5–15.5)
WBC: 17 10*3/uL — ABNORMAL HIGH (ref 4.0–10.5)

## 2015-11-30 LAB — HEMOGLOBIN A1C
HEMOGLOBIN A1C: 6.5 % — AB (ref 4.8–5.6)
MEAN PLASMA GLUCOSE: 140 mg/dL

## 2015-11-30 LAB — GLUCOSE, CAPILLARY: GLUCOSE-CAPILLARY: 115 mg/dL — AB (ref 65–99)

## 2015-11-30 MED ORDER — ONDANSETRON 4 MG PO TBDP: 4.0000 mg | ORAL_TABLET | Freq: Three times a day (TID) | ORAL | Status: DC | PRN

## 2015-11-30 MED ORDER — AMOXICILLIN-POT CLAVULANATE 875-125 MG PO TABS: 1.0000 | ORAL_TABLET | Freq: Two times a day (BID) | ORAL | Status: DC

## 2015-11-30 MED ORDER — HYOSCYAMINE SULFATE 0.125 MG SL SUBL
0.1250 mg | SUBLINGUAL_TABLET | SUBLINGUAL | Status: DC | PRN
  Filled 2015-11-30: qty 1

## 2015-11-30 MED ORDER — HYOSCYAMINE SULFATE 0.125 MG SL SUBL: 0.1250 mg | SUBLINGUAL_TABLET | SUBLINGUAL | Status: DC | PRN

## 2015-11-30 NOTE — Discharge Instructions (Signed)
°  Colitis °Colitis is inflammation of the colon. Colitis may last a short time (acute) or it may last a long time (chronic). °CAUSES °This condition may be caused by: °· Viruses. °· Bacteria. °· Reactions to medicine. °· Certain autoimmune diseases, such as Crohn disease or ulcerative colitis. °SYMPTOMS °Symptoms of this condition include: °· Diarrhea. °· Passing bloody or tarry stool. °· Pain. °· Fever. °· Vomiting. °· Tiredness (fatigue). °· Weight loss. °· Bloating. °· Sudden increase in abdominal pain. °· Having fewer bowel movements than usual. °DIAGNOSIS °This condition is diagnosed with a stool test or a blood test. You may also have other tests, including X-rays, a CT scan, or a colonoscopy. °TREATMENT °Treatment may include: °· Resting the bowel. This involves not eating or drinking for a period of time. °· Fluids that are given through an IV tube. °· Medicine for pain and diarrhea. °· Antibiotic medicines. °· Cortisone medicines. °· Surgery. °HOME CARE INSTRUCTIONS °Eating and Drinking °· Follow instructions from your health care provider about eating or drinking restrictions. °· Drink enough fluid to keep your urine clear or pale yellow. °· Work with a dietitian to determine which foods cause your condition to flare up. °· Avoid foods that cause flare-ups. °· Eat a well-balanced diet. °Medicines °· Take over-the-counter and prescription medicines only as told by your health care provider. °· If you were prescribed an antibiotic medicine, take it as told by your health care provider. Do not stop taking the antibiotic even if you start to feel better. °General Instructions °· Keep all follow-up visits as told by your health care provider. This is important. °SEEK MEDICAL CARE IF: °· Your symptoms do not go away. °· You develop new symptoms. °SEEK IMMEDIATE MEDICAL CARE IF: °· You have a fever that does not go away with treatment. °· You develop chills. °· You have extreme weakness, fainting, or  dehydration. °· You have repeated vomiting. °· You develop severe pain in your abdomen. °· You pass bloody or tarry stool. °  °This information is not intended to replace advice given to you by your health care provider. Make sure you discuss any questions you have with your health care provider. °  °Document Released: 12/08/2004 Document Revised: 07/22/2015 Document Reviewed: 02/23/2015 °Elsevier Interactive Patient Education ©2016 [employer] ° °

## 2015-11-30 NOTE — Discharge Summary (Signed)
Physician Discharge Summary  Kristin Bailey [id number] DOB: 04/11/1961 DOA: 11/27/2015  PCP: Rory Percy, MD  Admit date: 11/27/2015 Discharge date: 11/30/2015  Recommendations for Outpatient Follow-up:  1. Continue Augmentin for 11 days on discharge.  Discharge Diagnoses:  Principal Problem:   Septic shock ([place]) Active Problems:   UTI (lower urinary tract infection)   Colitis   Leukocytosis   Acute respiratory failure with hypoxia (HCC)   Depression   AKI (acute kidney injury) ([place])   Controlled type 2 diabetes mellitus without complication, without long-term current use of insulin ([place])    Discharge Condition: stable   Diet recommendation: as tolerated   History of present illness:  55 y.o. female with past medical history of diverticulitis, hypertension, diabetes mellitus, depression who presented with nausea, vomiting, diarrhea.for past day prior to this admission. She had no blood in emesis. She did have some blood with diarrhea. She also had abdominal cramping. No fevers but she did have chills. No GU complaints.   In ED, patient was found to have blood pressure 65/55, which responded to 2 L IV fluid. Blood work showed lctate of 6.34, WBCs 32, UA showed small leukocytes and many bacteria. CT angio gram showed possible colitis. She was seen by CCM. She was started on empiric zosyn.  Hospital Course:    Assessment/Plan:    Principal Problem:  Septic shock ([place]) / Colitis / UTI (lower urinary tract infection) / Leukocytosis - Septic shock on admission with hypothermia, tachycardia, tachypnea, hypoxia, hypotension of 65/55, lactic acid of 6.34, WBC count 32 - Source of infection presumably colitis and /or uti - CT angio chest concerning for possible inflammatory colitis - Patient was on Zosyn in hospital. - She will continue Augmentin on discharge for 11 days. Please note she did not tolerate Cipro and reported quite disturbing side effects to her such as  indigestion, even palpitations and severe intolerance to this medication. - Blood cultures so far show no growth  - GI pathogen panel unremarkable   Active Problems:  Acute respiratory failure with hypoxia (HCC) - No signs of pneumonia - Stable on room air    Depression - Continue Cymbalta - Stable - Not depressed    AKI (acute kidney injury) ([place]) - Due to sepsis, lisinopril. Lisinopril held  - Renal function now NWL   Controlled type 2 diabetes mellitus without complication, without long-term current use of insulin (HCC) - A1c is 6.5 indicating good glycemic control - Continue glipizide on discharge   DVT Prophylaxis  - SCD's bilaterally   Code Status: Full.  Family Communication: plan of care discussed with the patient   IV access:  Peripheral IV  Procedures and diagnostic studies:   Dg Chest Portable 1 View 11/27/2015 No acute cardiopulmonary process. Electronically Signed By: Suzy Bouchard M.D. On: 11/27/2015 17:19   Dg Abd Portable 1v 11/27/2015 No evidence bowel obstruction or ileus. Electronically Signed By: Marijo Conception, M.D. On: 11/27/2015 17:22   Ct Angio Chest Aorta W/cm &/or Wo/cm 11/27/2015 No evidence of thoracic or abdominal aortic aneurysm or dissection. No evidence of pulmonary embolus. Probable fatty infiltration of the liver. Mild wall thickening of the transverse and descending colon is noted suggesting possible infectious or inflammatory colitis. Sigmoid diverticulosis is noted without inflammation. Electronically Signed By: Marijo Conception, M.D. On: 11/27/2015 19:06   Ct Angio Abd/pel W/ And/or W/o 11/27/2015 No evidence of thoracic or abdominal aortic aneurysm or dissection. No evidence of pulmonary embolus. Probable fatty infiltration of the liver. Mild  wall thickening of the transverse and descending colon is noted suggesting possible infectious or inflammatory colitis. Sigmoid diverticulosis is noted without  inflammation. Electronically Signed By: Marijo Conception, M.D. On: 11/27/2015 19:06   Medical Consultants:  CCM  Other Consultants:  None   IAnti-Infectives:   Zosyn 11/27/2015 --> 11/30/2015   Signed:  Leisa Lenz, MD  [facility] 11/30/2015, 10:35 AM  Pager #: [phone]  Time spent in minutes: more than 30 minutes  Discharge Exam: Filed Vitals:   11/29/15 2148 11/30/15 0351  BP: 134/69 129/66  Pulse: 90 79  Temp: 99.6 F (37.6 C) 98.3 F (36.8 C)  Resp: 18 20   Filed Vitals:   11/29/15 0507 11/29/15 1357 11/29/15 2148 11/30/15 0351  BP: 98/61 136/72 134/69 129/66  Pulse: 82 84 90 79  Temp: 98.6 F (37 C) 98.5 F (36.9 C) 99.6 F (37.6 C) 98.3 F (36.8 C)  TempSrc: Oral Oral Oral Oral  Resp: 20 18 18 20   Height:      Weight:      SpO2: 98% 99% 100% 97%    General: Pt is alert, follows commands appropriately, not in acute distress Cardiovascular: Regular rate and rhythm, S1/S2 +, no murmurs Respiratory: Clear to auscultation bilaterally, no wheezing, no crackles, no rhonchi Abdominal: Soft, non tender, non distended, bowel sounds +, no guarding Extremities: no edema, no cyanosis, pulses palpable bilaterally DP and PT Neuro: Grossly nonfocal  Discharge Instructions  Discharge Instructions    Call MD for:  difficulty breathing, headache or visual disturbances    Complete by:  As directed      Call MD for:  persistant dizziness or light-headedness    Complete by:  As directed      Call MD for:  persistant nausea and vomiting    Complete by:  As directed      Call MD for:  severe uncontrolled pain    Complete by:  As directed      Diet - low sodium heart healthy    Complete by:  As directed      Discharge instructions    Complete by:  As directed   1. Continue Augmentin for 11 days on discharge.     Increase activity slowly    Complete by:  As directed             Medication List    STOP taking these medications         ciprofloxacin 500 MG tablet  Commonly known as:  CIPRO     metroNIDAZOLE 500 MG tablet  Commonly known as:  FLAGYL      TAKE these medications        acetaminophen 650 MG CR tablet  Commonly known as:  TYLENOL  Take 1,300 mg by mouth 2 (two) times daily as needed for pain.     albuterol 108 (90 Base) MCG/ACT inhaler  Commonly known as:  PROVENTIL HFA;VENTOLIN HFA  Inhale 2 puffs into the lungs every 4 (four) hours as needed for wheezing or shortness of breath.     ALPRAZolam 0.25 MG tablet  Commonly known as:  XANAX  Take 0.25 mg by mouth 2 (two) times daily as needed for anxiety.     amoxicillin-clavulanate 875-125 MG tablet  Commonly known as:  AUGMENTIN  Take 1 tablet by mouth 2 (two) times daily.     aspirin EC 81 MG tablet  Take 81 mg by mouth daily.     DULoxetine 60 MG capsule  Commonly known as:  CYMBALTA  Take 60 mg by mouth daily.     glipiZIDE 5 MG tablet  Commonly known as:  GLUCOTROL  Take 5 mg by mouth 2 (two) times daily.     hyoscyamine 0.125 MG tablet  Commonly known as:  LEVSIN, ANASPAZ  Take 0.125 mg by mouth every 4 (four) hours as needed for cramping.     levothyroxine 75 MCG tablet  Commonly known as:  SYNTHROID, LEVOTHROID  Take 75 mcg by mouth daily before breakfast.     lisinopril-hydrochlorothiazide 20-12.5 MG tablet  Commonly known as:  PRINZIDE,ZESTORETIC  Take 1 tablet by mouth daily.     loratadine 10 MG tablet  Commonly known as:  CLARITIN  Take 10 mg by mouth daily.     multivitamin with minerals Tabs tablet  Take 1 tablet by mouth daily.     omeprazole 20 MG tablet  Commonly known as:  PRILOSEC OTC  Take 1 tablet (20 mg total) by mouth daily.     ondansetron 4 MG disintegrating tablet  Commonly known as:  ZOFRAN ODT  Take 1 tablet (4 mg total) by mouth every 8 (eight) hours as needed for nausea or vomiting.     TRULICITY [id number] Sopn  Generic drug:  Dulaglutide  Inject 0.5 mLs into the skin once a week. On   Monday's.           Follow-up Information    Follow up with Rory Percy, MD. Schedule an appointment as soon as possible for a visit in 1 week.   Specialty:  Family Medicine   Why:  Follow up appt after recent hospitalization   Contact information:   [address] [place] [id number] [phone]        The results of significant diagnostics from this hospitalization (including imaging, microbiology, ancillary and laboratory) are listed below for reference.    Significant Diagnostic Studies: Dg Chest Portable 1 View  11/27/2015  CLINICAL DATA:  Weakness, short of breath, nausea and vomiting. EXAM: PORTABLE CHEST 1 VIEW COMPARISON:  CT 11/30/2013 FINDINGS: Normal mediastinum and cardiac silhouette. Normal pulmonary vasculature. No evidence of effusion, infiltrate, or pneumothorax. No acute bony abnormality. IMPRESSION: No acute cardiopulmonary process. Electronically Signed   By: Suzy Bouchard M.D.   On: 11/27/2015 17:19   Dg Abd Portable 1v  11/27/2015  CLINICAL DATA:  Generalized abdominal pain. EXAM: PORTABLE ABDOMEN - 1 VIEW COMPARISON:  Radiograph of August 30, 2015. CT scan of August 26, 2015. FINDINGS: The bowel gas pattern is normal. Status post cholecystectomy. No renal calculi are noted. IMPRESSION: No evidence bowel obstruction or ileus. Electronically Signed   By: Marijo Conception, M.D.   On: 11/27/2015 17:22   Ct Angio Chest Aorta W/cm &/or Wo/cm  11/27/2015  CLINICAL DATA:  Acute lower abdominal pain.  Diaphoresis. EXAM: CT ANGIOGRAPHY CHEST, ABDOMEN AND PELVIS TECHNIQUE: Multidetector CT imaging through the chest, abdomen and pelvis was performed using the standard protocol during bolus administration of intravenous contrast. Multiplanar reconstructed images and MIPs were obtained and reviewed to evaluate the vascular anatomy. CONTRAST:  143mL OMNIPAQUE IOHEXOL 350 MG/ML SOLN COMPARISON:  CT scan of August 26, 2015. FINDINGS: CTA CHEST FINDINGS No pneumothorax or pleural  effusion is noted. No acute pulmonary disease is noted. There is no evidence of pulmonary embolus. There is no evidence of thoracic aortic dissection or aneurysm. Great vessels are widely patent. No mediastinal mass or adenopathy is noted. No significant osseous abnormality is noted.  Review of the MIP images confirms the above findings. CTA ABDOMEN AND PELVIS FINDINGS Status post cholecystectomy. Probable fatty infiltration of the liver. The spleen and pancreas appear normal. Adrenal glands and kidneys appear normal. No hydronephrosis or renal obstruction is noted. The appendix appears normal. There is no evidence of bowel obstruction. Urinary bladder appears normal. Uterus and ovaries are unremarkable. Mild amount of free fluid is in dependent portion of the pelvis which most likely is physiologic. Sigmoid diverticulosis is noted without inflammation. Mild wall thickening of the transverse and descending colon is noted suggesting possible infectious or inflammatory colitis. No significant adenopathy is noted. There is no evidence of abdominal aortic aneurysm or dissection. The mesenteric and renal arteries are widely patent without significant stenosis. Iliac arteries are widely patent without significant stenosis. Review of the MIP images confirms the above findings. IMPRESSION: No evidence of thoracic or abdominal aortic aneurysm or dissection. No evidence of pulmonary embolus. Probable fatty infiltration of the liver. Mild wall thickening of the transverse and descending colon is noted suggesting possible infectious or inflammatory colitis. Sigmoid diverticulosis is noted without inflammation. Electronically Signed   By: Marijo Conception, M.D.   On: 11/27/2015 19:06   Ct Angio Abd/pel W/ And/or W/o  11/27/2015  CLINICAL DATA:  Acute lower abdominal pain.  Diaphoresis. EXAM: CT ANGIOGRAPHY CHEST, ABDOMEN AND PELVIS TECHNIQUE: Multidetector CT imaging through the chest, abdomen and pelvis was performed using the  standard protocol during bolus administration of intravenous contrast. Multiplanar reconstructed images and MIPs were obtained and reviewed to evaluate the vascular anatomy. CONTRAST:  162mL OMNIPAQUE IOHEXOL 350 MG/ML SOLN COMPARISON:  CT scan of August 26, 2015. FINDINGS: CTA CHEST FINDINGS No pneumothorax or pleural effusion is noted. No acute pulmonary disease is noted. There is no evidence of pulmonary embolus. There is no evidence of thoracic aortic dissection or aneurysm. Great vessels are widely patent. No mediastinal mass or adenopathy is noted. No significant osseous abnormality is noted. Review of the MIP images confirms the above findings. CTA ABDOMEN AND PELVIS FINDINGS Status post cholecystectomy. Probable fatty infiltration of the liver. The spleen and pancreas appear normal. Adrenal glands and kidneys appear normal. No hydronephrosis or renal obstruction is noted. The appendix appears normal. There is no evidence of bowel obstruction. Urinary bladder appears normal. Uterus and ovaries are unremarkable. Mild amount of free fluid is in dependent portion of the pelvis which most likely is physiologic. Sigmoid diverticulosis is noted without inflammation. Mild wall thickening of the transverse and descending colon is noted suggesting possible infectious or inflammatory colitis. No significant adenopathy is noted. There is no evidence of abdominal aortic aneurysm or dissection. The mesenteric and renal arteries are widely patent without significant stenosis. Iliac arteries are widely patent without significant stenosis. Review of the MIP images confirms the above findings. IMPRESSION: No evidence of thoracic or abdominal aortic aneurysm or dissection. No evidence of pulmonary embolus. Probable fatty infiltration of the liver. Mild wall thickening of the transverse and descending colon is noted suggesting possible infectious or inflammatory colitis. Sigmoid diverticulosis is noted without inflammation.  Electronically Signed   By: Marijo Conception, M.D.   On: 11/27/2015 19:06    Microbiology: Recent Results (from the past 240 hour(s))  Blood Culture (routine x 2)     Status: None (Preliminary result)   Collection Time: 11/27/15  4:12 PM  Result Value Ref Range Status   Specimen Description BLOOD RIGHT ARM  Final   Special Requests BOTTLES DRAWN AEROBIC AND ANAEROBIC  5CC  Final   Culture   Final    NO GROWTH 2 DAYS Performed at [facility]    Report Status PENDING  Incomplete  Urine culture     Status: None   Collection Time: 11/27/15  5:54 PM  Result Value Ref Range Status   Specimen Description URINE, CLEAN CATCH  Final   Special Requests NONE  Final   Culture   Final    MULTIPLE SPECIES PRESENT, SUGGEST RECOLLECTION Performed at [facility]    Report Status 11/29/2015 FINAL  Final  Gastrointestinal Panel by PCR , Stool     Status: None   Collection Time: 11/27/15  9:06 PM  Result Value Ref Range Status   Campylobacter species NOT DETECTED NOT DETECTED Final   Plesimonas shigelloides NOT DETECTED NOT DETECTED Final   Salmonella species NOT DETECTED NOT DETECTED Final   Yersinia enterocolitica NOT DETECTED NOT DETECTED Final   Vibrio species NOT DETECTED NOT DETECTED Final   Vibrio cholerae NOT DETECTED NOT DETECTED Final   Enteroaggregative E coli (EAEC) NOT DETECTED NOT DETECTED Final   Enteropathogenic E coli (EPEC) NOT DETECTED NOT DETECTED Final   Enterotoxigenic E coli (ETEC) NOT DETECTED NOT DETECTED Final   Shiga like toxin producing E coli (STEC) NOT DETECTED NOT DETECTED Final   E. coli O157 NOT DETECTED NOT DETECTED Final   Shigella/Enteroinvasive E coli (EIEC) NOT DETECTED NOT DETECTED Final   Cryptosporidium NOT DETECTED NOT DETECTED Final   Cyclospora cayetanensis NOT DETECTED NOT DETECTED Final   Entamoeba histolytica NOT DETECTED NOT DETECTED Final   Giardia lamblia NOT DETECTED NOT DETECTED Final   Adenovirus F40/41 NOT DETECTED NOT  DETECTED Final   Astrovirus NOT DETECTED NOT DETECTED Final   Norovirus GI/GII NOT DETECTED NOT DETECTED Final   Rotavirus A NOT DETECTED NOT DETECTED Final   Sapovirus (I, II, IV, and V) NOT DETECTED NOT DETECTED Final  C difficile quick scan w PCR reflex     Status: None   Collection Time: 11/27/15  9:06 PM  Result Value Ref Range Status   C Diff antigen NEGATIVE NEGATIVE Final   C Diff toxin NEGATIVE NEGATIVE Final   C Diff interpretation Negative for toxigenic C. difficile  Final  Blood Culture (routine x 2)     Status: None (Preliminary result)   Collection Time: 11/27/15  9:40 PM  Result Value Ref Range Status   Specimen Description BLOOD BLOOD LEFT HAND  Final   Special Requests BOTTLES DRAWN AEROBIC AND ANAEROBIC 5ML  Final   Culture   Final    NO GROWTH 1 DAY Performed at [facility]    Report Status PENDING  Incomplete     Labs: Basic Metabolic Panel:  Recent Labs Lab 11/27/15 1552 11/28/15 0448  NA 138 143  K 3.7 3.9  CL 102 112*  CO2 18* 20*  GLUCOSE 238* 135*  BUN 19 13  CREATININE 1.18* 0.69  CALCIUM 9.6 7.7*   Liver Function Tests:  Recent Labs Lab 11/27/15 1552 11/28/15 0448  AST 48* 28  ALT 47 34  ALKPHOS 90 73  BILITOT 0.8 0.7  PROT 8.3* 6.2*  ALBUMIN 4.3 3.3*    Recent Labs Lab 11/27/15 1552  LIPASE 47   No results for input(s): AMMONIA in the last 168 hours. CBC:  Recent Labs Lab 11/27/15 1552 11/28/15 0448 11/30/15 0920  WBC 32.0* 21.8* 17.0*  NEUTROABS 27.2*  --   --   HGB 16.7* 12.1 11.4*  HCT 51.5* 37.7 35.9*  MCV 85.4 84.7 86.1  PLT 434* 293 273   Cardiac Enzymes: No results for input(s): CKTOTAL, CKMB, CKMBINDEX, TROPONINI in the last 168 hours. BNP: BNP (last 3 results) No results for input(s): BNP in the last 8760 hours.  ProBNP (last 3 results) No results for input(s): PROBNP in the last 8760 hours.  CBG:  Recent Labs Lab 11/29/15 0744 11/29/15 1202 11/29/15 1651 11/29/15 2151 11/30/15 0749   GLUCAP 94 114* 138* 139* 115*

## 2015-11-30 NOTE — Progress Notes (Signed)
ANTIBIOTIC CONSULT NOTE   Pharmacy Consult for Zosyn Indication: septis secondary to colitis and UTI  No Known Allergies  Patient Measurements: Height: 5\' 6"  (167.6 cm) Weight: 277 lb 12.5 oz (126 kg) IBW/kg (Calculated) : 59.3  Vital Signs: Temp: 98.3 F (36.8 C) (01/16 0351) Temp Source: Oral (01/16 0351) BP: 129/66 mmHg (01/16 0351) Pulse Rate: 79 (01/16 0351) Intake/Output from previous day: 01/15 0701 - 01/16 0700 In: 3258.3 [P.O.:720; I.V.:2288.3; IV Piggyback:250] Out: -  Intake/Output from this shift: Total I/O In: 311.7 [I.V.:311.7] Out: -   Labs:  Recent Labs  11/27/15 1552 11/28/15 0111 11/28/15 0448 11/30/15 0920  WBC 32.0*  --  21.8* 17.0*  HGB 16.7*  --  12.1 11.4*  PLT 434*  --  293 273  LABCREA  --  38.58  --   --   CREATININE 1.18*  --  0.69  --    Estimated Creatinine Clearance: 110.4 mL/min (by C-G formula based on Cr of 0.69).    Microbiology: Recent Results (from the past 720 hour(s))  Blood Culture (routine x 2)     Status: None (Preliminary result)   Collection Time: 11/27/15  4:12 PM  Result Value Ref Range Status   Specimen Description BLOOD RIGHT ARM  Final   Special Requests BOTTLES DRAWN AEROBIC AND ANAEROBIC 5CC  Final   Culture   Final    NO GROWTH 2 DAYS Performed at [facility]    Report Status PENDING  Incomplete  Urine culture     Status: None   Collection Time: 11/27/15  5:54 PM  Result Value Ref Range Status   Specimen Description URINE, CLEAN CATCH  Final   Special Requests NONE  Final   Culture   Final    MULTIPLE SPECIES PRESENT, SUGGEST RECOLLECTION Performed at [facility]    Report Status 11/29/2015 FINAL  Final  Gastrointestinal Panel by PCR , Stool     Status: None   Collection Time: 11/27/15  9:06 PM  Result Value Ref Range Status   Campylobacter species NOT DETECTED NOT DETECTED Final   Plesimonas shigelloides NOT DETECTED NOT DETECTED Final   Salmonella species NOT DETECTED NOT  DETECTED Final   Yersinia enterocolitica NOT DETECTED NOT DETECTED Final   Vibrio species NOT DETECTED NOT DETECTED Final   Vibrio cholerae NOT DETECTED NOT DETECTED Final   Enteroaggregative E coli (EAEC) NOT DETECTED NOT DETECTED Final   Enteropathogenic E coli (EPEC) NOT DETECTED NOT DETECTED Final   Enterotoxigenic E coli (ETEC) NOT DETECTED NOT DETECTED Final   Shiga like toxin producing E coli (STEC) NOT DETECTED NOT DETECTED Final   E. coli O157 NOT DETECTED NOT DETECTED Final   Shigella/Enteroinvasive E coli (EIEC) NOT DETECTED NOT DETECTED Final   Cryptosporidium NOT DETECTED NOT DETECTED Final   Cyclospora cayetanensis NOT DETECTED NOT DETECTED Final   Entamoeba histolytica NOT DETECTED NOT DETECTED Final   Giardia lamblia NOT DETECTED NOT DETECTED Final   Adenovirus F40/41 NOT DETECTED NOT DETECTED Final   Astrovirus NOT DETECTED NOT DETECTED Final   Norovirus GI/GII NOT DETECTED NOT DETECTED Final   Rotavirus A NOT DETECTED NOT DETECTED Final   Sapovirus (I, II, IV, and V) NOT DETECTED NOT DETECTED Final  C difficile quick scan w PCR reflex     Status: None   Collection Time: 11/27/15  9:06 PM  Result Value Ref Range Status   C Diff antigen NEGATIVE NEGATIVE Final   C Diff toxin NEGATIVE NEGATIVE Final  C Diff interpretation Negative for toxigenic C. difficile  Final  Blood Culture (routine x 2)     Status: None (Preliminary result)   Collection Time: 11/27/15  9:40 PM  Result Value Ref Range Status   Specimen Description BLOOD BLOOD LEFT HAND  Final   Special Requests BOTTLES DRAWN AEROBIC AND ANAEROBIC 5ML  Final   Culture   Final    NO GROWTH 1 DAY Performed at [facility]    Report Status PENDING  Incomplete    Medical History: Past Medical History  Diagnosis Date  . Depression     Medications:  Anti-infectives    Start     Dose/Rate Route Frequency Ordered Stop   11/28/15 0200  piperacillin-tazobactam (ZOSYN) IVPB 3.375 g     3.375 g 12.5  mL/hr over 240 Minutes Intravenous Every 8 hours 11/27/15 1747     11/27/15 1615  piperacillin-tazobactam (ZOSYN) IVPB 3.375 g     3.375 g 100 mL/hr over 30 Minutes Intravenous  Once 11/27/15 1610 11/27/15 1904     Assessment: 55 y.o. female with PMH DM, IBS, hypothyroid, diverticulitis, presents 11/27/2015 with severe nonbloody diarrhea/emesis, abdominal cramping, and episodic SOB, diaphoresis, and dizziness.  Hypothermic and with elevated LA in ED; pharmacy to begin Zosyn for intra-abdominal infection.  Also with elevated D-dimer and hyperglycemia.  Pending workup for PE, DKA, Cdiff  1/13 >> Zosyn >>  Today, 11/30/2015: Hypothermia resolved.  Temp normal. Leukocytosis improving SCr improved to WNL.  CrCl well above 20 mL/min for Zosyn dosing. CT 1/13 was consistent with possible infectious or inflammatory colitis. Cultures unrevealing to date. Cdiff PCR negative for antigen and toxin. GI Panel PCR negative for all pathogens tested.   Goal of Therapy:  Eradication of infection Appropriate antibiotic dosing for indication and renal function  Plan:  Day 4 antibiotics Continue empiric Zosyn 3.375 g IV every 8 hrs by 4-hr infusion  Follow clinical course, renal function, culture results as available  Follow for de-escalation of antibiotics and LOT  Clayburn Pert, PharmD, BCPS Pager: [phone] 11/30/2015  10:22 AM

## 2015-12-01 LAB — RESPIRATORY VIRUS PANEL
Adenovirus: NEGATIVE
INFLUENZA A: NEGATIVE
INFLUENZA B 1: NEGATIVE
METAPNEUMOVIRUS: NEGATIVE
PARAINFLUENZA 2 A: NEGATIVE
PARAINFLUENZA 3 A: NEGATIVE
Parainfluenza 1: NEGATIVE
RESPIRATORY SYNCYTIAL VIRUS A: NEGATIVE
Respiratory Syncytial Virus B: NEGATIVE
Rhinovirus: NEGATIVE

## 2015-12-02 LAB — CULTURE, BLOOD (ROUTINE X 2): CULTURE: NO GROWTH

## 2015-12-03 LAB — CULTURE, BLOOD (ROUTINE X 2): Culture: NO GROWTH

## 2016-02-02 ENCOUNTER — Encounter (INDEPENDENT_AMBULATORY_CARE_PROVIDER_SITE_OTHER): Payer: Self-pay | Admitting: *Deleted

## 2016-02-24 ENCOUNTER — Ambulatory Visit (INDEPENDENT_AMBULATORY_CARE_PROVIDER_SITE_OTHER): Payer: Self-pay | Admitting: Internal Medicine

## 2016-02-25 ENCOUNTER — Encounter (INDEPENDENT_AMBULATORY_CARE_PROVIDER_SITE_OTHER): Payer: Self-pay | Admitting: *Deleted

## 2016-03-16 ENCOUNTER — Encounter (HOSPITAL_COMMUNITY): Payer: Self-pay | Admitting: *Deleted

## 2016-03-16 ENCOUNTER — Inpatient Hospital Stay (HOSPITAL_COMMUNITY)
Admission: EM | Admit: 2016-03-16 | Discharge: 2016-03-19 | DRG: 392 | Disposition: A | Discharge status: Home / Self Care | Payer: Self-pay | Attending: Internal Medicine | Admitting: Internal Medicine

## 2016-03-16 ENCOUNTER — Emergency Department (HOSPITAL_COMMUNITY): Payer: Self-pay

## 2016-03-16 DIAGNOSIS — Z7984 Long term (current) use of oral hypoglycemic drugs: Secondary | ICD-10-CM

## 2016-03-16 DIAGNOSIS — A09 Infectious gastroenteritis and colitis, unspecified: Principal | ICD-10-CM | POA: Diagnosis present

## 2016-03-16 DIAGNOSIS — F329 Major depressive disorder, single episode, unspecified: Secondary | ICD-10-CM | POA: Diagnosis present

## 2016-03-16 DIAGNOSIS — E119 Type 2 diabetes mellitus without complications: Secondary | ICD-10-CM | POA: Diagnosis present

## 2016-03-16 DIAGNOSIS — K644 Residual hemorrhoidal skin tags: Secondary | ICD-10-CM | POA: Diagnosis present

## 2016-03-16 DIAGNOSIS — F419 Anxiety disorder, unspecified: Secondary | ICD-10-CM | POA: Diagnosis present

## 2016-03-16 DIAGNOSIS — Z800 Family history of malignant neoplasm of digestive organs: Secondary | ICD-10-CM

## 2016-03-16 DIAGNOSIS — Z7982 Long term (current) use of aspirin: Secondary | ICD-10-CM

## 2016-03-16 DIAGNOSIS — Z8249 Family history of ischemic heart disease and other diseases of the circulatory system: Secondary | ICD-10-CM

## 2016-03-16 DIAGNOSIS — K219 Gastro-esophageal reflux disease without esophagitis: Secondary | ICD-10-CM | POA: Diagnosis present

## 2016-03-16 DIAGNOSIS — I10 Essential (primary) hypertension: Secondary | ICD-10-CM | POA: Diagnosis present

## 2016-03-16 DIAGNOSIS — Z86010 Personal history of colonic polyps: Secondary | ICD-10-CM

## 2016-03-16 DIAGNOSIS — Z79899 Other long term (current) drug therapy: Secondary | ICD-10-CM

## 2016-03-16 DIAGNOSIS — K317 Polyp of stomach and duodenum: Secondary | ICD-10-CM | POA: Diagnosis present

## 2016-03-16 DIAGNOSIS — Z807 Family history of other malignant neoplasms of lymphoid, hematopoietic and related tissues: Secondary | ICD-10-CM

## 2016-03-16 DIAGNOSIS — Z823 Family history of stroke: Secondary | ICD-10-CM

## 2016-03-16 DIAGNOSIS — Z833 Family history of diabetes mellitus: Secondary | ICD-10-CM

## 2016-03-16 DIAGNOSIS — E039 Hypothyroidism, unspecified: Secondary | ICD-10-CM | POA: Diagnosis present

## 2016-03-16 DIAGNOSIS — K529 Noninfective gastroenteritis and colitis, unspecified: Secondary | ICD-10-CM

## 2016-03-16 HISTORY — DX: Pneumonia, unspecified organism: J18.9

## 2016-03-16 HISTORY — DX: Gastro-esophageal reflux disease without esophagitis: K21.9

## 2016-03-16 LAB — COMPREHENSIVE METABOLIC PANEL
ALBUMIN: 4.2 g/dL (ref 3.5–5.0)
ALK PHOS: 77 U/L (ref 38–126)
ALT: 31 U/L (ref 14–54)
ANION GAP: 10 (ref 5–15)
AST: 23 U/L (ref 15–41)
BILIRUBIN TOTAL: 0.5 mg/dL (ref 0.3–1.2)
BUN: 12 mg/dL (ref 6–20)
CALCIUM: 9.3 mg/dL (ref 8.9–10.3)
CO2: 25 mmol/L (ref 22–32)
CREATININE: 0.59 mg/dL (ref 0.44–1.00)
Chloride: 104 mmol/L (ref 101–111)
GFR calc Af Amer: >60 mL/min (ref >60)
GFR calc non Af Amer: >60 mL/min (ref >60)
GLUCOSE: 112 mg/dL — AB (ref 65–99)
Potassium: 3.8 mmol/L (ref 3.5–5.1)
SODIUM: 139 mmol/L (ref 135–145)
TOTAL PROTEIN: 8.6 g/dL — AB (ref 6.5–8.1)

## 2016-03-16 LAB — CBC WITH DIFFERENTIAL/PLATELET
BASOS PCT: 0 %
Basophils Absolute: 0 10*3/uL (ref 0.0–0.1)
Eosinophils Absolute: 0.2 10*3/uL (ref 0.0–0.7)
Eosinophils Relative: 1 %
HEMATOCRIT: 42.6 % (ref 36.0–46.0)
HEMOGLOBIN: 14.3 g/dL (ref 12.0–15.0)
LYMPHS ABS: 3.7 10*3/uL (ref 0.7–4.0)
Lymphocytes Relative: 19 %
MCH: 27.9 pg (ref 26.0–34.0)
MCHC: 33.6 g/dL (ref 30.0–36.0)
MCV: 83.2 fL (ref 78.0–100.0)
MONOS PCT: 6 %
Monocytes Absolute: 1.2 10*3/uL — ABNORMAL HIGH (ref 0.1–1.0)
NEUTROS ABS: 14.8 10*3/uL — AB (ref 1.7–7.7)
NEUTROS PCT: 74 %
Platelets: 324 10*3/uL (ref 150–400)
RBC: 5.12 MIL/uL — AB (ref 3.87–5.11)
RDW: 14.3 % (ref 11.5–15.5)
WBC: 19.9 10*3/uL — AB (ref 4.0–10.5)

## 2016-03-16 MED ORDER — MORPHINE SULFATE (PF) 4 MG/ML IV SOLN
6.0000 mg | Freq: Once | INTRAVENOUS | Status: AC
  Administered 2016-03-16: 6 mg via INTRAVENOUS
  Filled 2016-03-16: qty 2

## 2016-03-16 MED ORDER — ONDANSETRON HCL 4 MG/2ML IJ SOLN
4.0000 mg | Freq: Once | INTRAMUSCULAR | Status: AC
  Administered 2016-03-16: 4 mg via INTRAVENOUS
  Filled 2016-03-16: qty 2

## 2016-03-16 MED ORDER — SODIUM CHLORIDE 0.9 % IV BOLUS (SEPSIS)
1000.0000 mL | Freq: Once | INTRAVENOUS | Status: AC
  Administered 2016-03-16: 1000 mL via INTRAVENOUS

## 2016-03-16 MED ORDER — DIATRIZOATE MEGLUMINE & SODIUM 66-10 % PO SOLN
ORAL | Status: AC
  Administered 2016-03-16
  Filled 2016-03-16: qty 30

## 2016-03-16 MED ORDER — DICYCLOMINE HCL 10 MG/ML IM SOLN
20.0000 mg | Freq: Once | INTRAMUSCULAR | Status: AC
  Administered 2016-03-16: 20 mg via INTRAMUSCULAR
  Filled 2016-03-16: qty 2

## 2016-03-16 MED ORDER — SODIUM CHLORIDE 0.9 % IV BOLUS (SEPSIS)
1000.0000 mL | Freq: Once | INTRAVENOUS | Status: AC
  Administered 2016-03-17: 1000 mL via INTRAVENOUS

## 2016-03-16 NOTE — ED Notes (Signed)
Pt c/o n/v/d that started yesterday, has hx of IBS and states that this feels the same,

## 2016-03-16 NOTE — ED Provider Notes (Signed)
By signing my name below, I, Doran Stabler, attest that this documentation has been prepared under the direction and in the presence of [employer], DO. Electronically Signed: Doran Stabler, ED Scribe. 03/16/2016. 11:22 PM.  TIME SEEN: 11:12 PM  CHIEF COMPLAINT:  Chief Complaint  Patient presents with  . Emesis    HPI:  HPI Comments: Kristin Bailey is a 55 y.o. female who presents to the Emergency Department with a PMHx of IBS, diverticulitis, and Reported history of ulcerative colitis complaining of vomiting and diarrhea that began yesterday. Pt also reports abdominal cramps, weakness and HA. Pt states her diarrhea has been very watery in nature. She is unable to tolerate PO without having diarrhea. In addition, she states she has a frequent burps which smells like "rotten eggs." Pt denies any fevers, chills, blood in stools, melena, dysuria, vaginal bleeding/discharge, or any other symptoms at this time. Pt denies any recent travels or sick contacts. No recent antibiotic use.  LNMP 08/2015  Pt was recently admitted to [facility] and states she was told she had ulcerative colitis. States she has had previous normal colonoscopy. When asked how she was diagnosed with ulcerative colitis, patient states that the gastroenterologist told her that's what this could be.  She is not on medication for inflammatory bowel disease. She has an appointment with Dr. Paulita Fujita, GI on 03/31/16.   ROS: See HPI Constitutional: no fever Eyes: no drainage  ENT: no runny nose   Cardiovascular:  no chest pain  Resp: no SOB  GI: vomiting, diarrhea, abdominal pain GU: no dysuria Integumentary: no rash  Allergy: no hives  Musculoskeletal: no leg swelling  Neurological: HA, weakness, no slurred speech ROS otherwise negative  PAST MEDICAL HISTORY/PAST SURGICAL HISTORY:  Past Medical History  Diagnosis Date  . Depression   . IBS (irritable bowel syndrome)   . Hypertension   . Diabetes mellitus  without complication ([place])   . Thyroid disease     MEDICATIONS:  Prior to Admission medications   Medication Sig Start Date End Date Taking? Authorizing Provider  acetaminophen (TYLENOL) 650 MG CR tablet Take 1,300 mg by mouth 2 (two) times daily as needed for pain.    Historical Provider, MD  albuterol (PROVENTIL HFA;VENTOLIN HFA) 108 (90 BASE) MCG/ACT inhaler Inhale 2 puffs into the lungs every 4 (four) hours as needed for wheezing or shortness of breath.    Historical Provider, MD  ALPRAZolam Duanne Moron) 0.25 MG tablet Take 0.25 mg by mouth 2 (two) times daily as needed for anxiety.    Historical Provider, MD  amoxicillin-clavulanate (AUGMENTIN) 875-125 MG tablet Take 1 tablet by mouth 2 (two) times daily. 11/30/15   Robbie Lis, MD  aspirin EC 81 MG tablet Take 81 mg by mouth daily.    Historical Provider, MD  Dulaglutide (TRULICITY) [id number] [id number] SOPN Inject 0.5 mLs into the skin once a week. On  Monday's.    Historical Provider, MD  DULoxetine (CYMBALTA) 60 MG capsule Take 60 mg by mouth daily.    Historical Provider, MD  glipiZIDE (GLUCOTROL) 5 MG tablet Take 5 mg by mouth 2 (two) times daily.     Historical Provider, MD  hyoscyamine (LEVSIN SL) 0.125 MG SL tablet Place 1 tablet (0.125 mg total) under the tongue every 4 (four) hours as needed for cramping. 11/30/15   Robbie Lis, MD  hyoscyamine (LEVSIN, ANASPAZ) 0.125 MG tablet Take 0.125 mg by mouth every 4 (four) hours as needed for cramping.  Historical Provider, MD  levothyroxine (SYNTHROID, LEVOTHROID) 75 MCG tablet Take 75 mcg by mouth daily before breakfast.    Historical Provider, MD  lisinopril-hydrochlorothiazide (PRINZIDE,ZESTORETIC) 20-12.5 MG per tablet Take 1 tablet by mouth daily.    Historical Provider, MD  loratadine (CLARITIN) 10 MG tablet Take 10 mg by mouth daily.    Historical Provider, MD  Multiple Vitamin (MULTIVITAMIN WITH MINERALS) TABS tablet Take 1 tablet by mouth daily.    Historical Provider, MD  omeprazole  (PRILOSEC OTC) 20 MG tablet Take 1 tablet (20 mg total) by mouth daily. 08/30/15   Carmin Muskrat, MD  ondansetron (ZOFRAN ODT) 4 MG disintegrating tablet Take 1 tablet (4 mg total) by mouth every 8 (eight) hours as needed for nausea or vomiting. 11/30/15   Robbie Lis, MD    ALLERGIES:  No Known Allergies  SOCIAL HISTORY:  Social History  Substance Use Topics  . Smoking status: Never Smoker   . Smokeless tobacco: Never Used  . Alcohol Use: No    FAMILY HISTORY: Family History  Problem Relation Age of Onset  . Cancer Mother     colon cancer age74  . Hypertension Mother   . Stroke Mother   . Diabetes Mother   . Non-Hodgkin's lymphoma Father   . Hypertension Father   . Diabetes Father   . Kidney disease Paternal Aunt   . Cancer Paternal Aunt   . Kidney disease Paternal Uncle   . Cancer Paternal Uncle   . Stroke Maternal Grandmother     EXAM: BP 136/51 mmHg  Pulse 87  Temp(Src) 98.4 F (36.9 C) (Oral)  Resp 20  Ht 5\' 6"  (1.676 m)  Wt 265 lb (120.203 kg)  BMI 42.79 kg/m2  SpO2 100%   CONSTITUTIONAL: Alert and oriented and responds appropriately to questions. Well-appearing; well-nourished, Appears uncomfortable, afebrile and nontoxic HEAD: Normocephalic EYES: Conjunctivae clear, PERRL ENT: normal nose; no rhinorrhea; moist mucous membranes NECK: Supple, no meningismus, no LAD  CARD: RRR; S1 and S2 appreciated; no murmurs, no clicks, no rubs, no gallops RESP: Normal chest excursion without splinting or tachypnea; breath sounds clear and equal bilaterally; no wheezes, no rhonchi, no rales, no hypoxia or respiratory distress, speaking full sentences ABD/GI: diffusely tender throughout the abdomen with voluntary guarding, Normal bowel sounds; non-distended; soft, non-tender, no rebound, no peritoneal signs BACK:  The back appears normal and is non-tender to palpation, there is no CVA tenderness EXT: Normal ROM in all joints; non-tender to palpation; no edema; normal  capillary refill; no cyanosis, no calf tenderness or swelling    SKIN: Normal color for age and race; warm; no rash NEURO: Moves all extremities equally, sensation to light touch intact diffusely, cranial nerves II through XII intact PSYCH: The patient's mood and manner are appropriate. Grooming and personal hygiene are appropriate.  MEDICAL DECISION MAKING: Patient here with symptoms of colitis. Diffusely tender throughout the abdomen. Will obtain CT imaging to evaluate for any possible complications. She is also had diverticulitis in the past. Will obtain labs, urine. Will give IV fluids, pain and nausea medicine. Will obtain stool cultures.  ED PROGRESS: Patient's labs show leukocytosis of 20,000 with left shift. Otherwise labs, urine unremarkable. CT scan shows enterocolitis. She has diverticulosis without diverticulitis. No abscess, bowel obstruction, mass appreciated. Patient is requiring multiple rounds of IV pain medication and antiemetics to feel comfortable. She would like admission to the hospital. Will give IV Flagyl, Cipro. Will discuss with hospitalist on-call. PCP is Dr. Rory Percy in [place].  2:20 AM  Discussed patient's case with Hospitalist, Dr. Darrick Meigs.  Recommend admission to medical, observation bed.  I will place holding orders per their request. Patient and family (if present) updated with plan. Care transferred to hospitalist service.  I reviewed all nursing notes, vitals, pertinent old records, EKGs, labs, imaging (as available).    I personally performed the services described in this documentation, which was scribed in my presence. The recorded information has been reviewed and is accurate.      [place], DO 03/17/16 [id number]

## 2016-03-17 ENCOUNTER — Encounter (HOSPITAL_COMMUNITY): Payer: Self-pay | Admitting: *Deleted

## 2016-03-17 DIAGNOSIS — E119 Type 2 diabetes mellitus without complications: Secondary | ICD-10-CM

## 2016-03-17 DIAGNOSIS — Z86010 Personal history of colonic polyps: Secondary | ICD-10-CM

## 2016-03-17 DIAGNOSIS — A09 Infectious gastroenteritis and colitis, unspecified: Secondary | ICD-10-CM | POA: Diagnosis present

## 2016-03-17 DIAGNOSIS — K529 Noninfective gastroenteritis and colitis, unspecified: Secondary | ICD-10-CM | POA: Diagnosis present

## 2016-03-17 LAB — GASTROINTESTINAL PANEL BY PCR, STOOL (REPLACES STOOL CULTURE)
ASTROVIRUS: NOT DETECTED
Adenovirus F40/41: NOT DETECTED
CYCLOSPORA CAYETANENSIS: NOT DETECTED
Campylobacter species: NOT DETECTED
Cryptosporidium: NOT DETECTED
E. coli O157: NOT DETECTED
ENTAMOEBA HISTOLYTICA: NOT DETECTED
ENTEROAGGREGATIVE E COLI (EAEC): NOT DETECTED
Enteropathogenic E coli (EPEC): NOT DETECTED
Enterotoxigenic E coli (ETEC): NOT DETECTED
GIARDIA LAMBLIA: NOT DETECTED
NOROVIRUS GI/GII: NOT DETECTED
Plesimonas shigelloides: NOT DETECTED
Rotavirus A: NOT DETECTED
SALMONELLA SPECIES: NOT DETECTED
SHIGELLA/ENTEROINVASIVE E COLI (EIEC): NOT DETECTED
Sapovirus (I, II, IV, and V): NOT DETECTED
Shiga like toxin producing E coli (STEC): NOT DETECTED
VIBRIO CHOLERAE: NOT DETECTED
VIBRIO SPECIES: NOT DETECTED
Yersinia enterocolitica: NOT DETECTED

## 2016-03-17 LAB — CBC
HCT: 37.2 % (ref 36.0–46.0)
Hemoglobin: 12.1 g/dL (ref 12.0–15.0)
MCH: 27.6 pg (ref 26.0–34.0)
MCHC: 32.5 g/dL (ref 30.0–36.0)
MCV: 84.7 fL (ref 78.0–100.0)
Platelets: 288 10*3/uL (ref 150–400)
RBC: 4.39 MIL/uL (ref 3.87–5.11)
RDW: 14.4 % (ref 11.5–15.5)
WBC: 16.8 10*3/uL — ABNORMAL HIGH (ref 4.0–10.5)

## 2016-03-17 LAB — URINALYSIS, ROUTINE W REFLEX MICROSCOPIC
Bilirubin Urine: NEGATIVE
Glucose, UA: NEGATIVE mg/dL
Hgb urine dipstick: NEGATIVE
Ketones, ur: NEGATIVE mg/dL
LEUKOCYTES UA: NEGATIVE
NITRITE: NEGATIVE
PROTEIN: NEGATIVE mg/dL
Specific Gravity, Urine: 1.02 (ref 1.005–1.030)
pH: 5.5 (ref 5.0–8.0)

## 2016-03-17 LAB — GLUCOSE, CAPILLARY
GLUCOSE-CAPILLARY: 91 mg/dL (ref 65–99)
Glucose-Capillary: 95 mg/dL (ref 65–99)
Glucose-Capillary: 83 mg/dL (ref 65–99)
Glucose-Capillary: 110 mg/dL — ABNORMAL HIGH (ref 65–99)

## 2016-03-17 LAB — CREATININE, SERUM
CREATININE: 0.59 mg/dL (ref 0.44–1.00)
GFR calc Af Amer: >60 mL/min (ref >60)
GFR calc non Af Amer: >60 mL/min (ref >60)

## 2016-03-17 LAB — C DIFFICILE QUICK SCREEN W PCR REFLEX
C DIFFICLE (CDIFF) ANTIGEN: NEGATIVE
C Diff interpretation: NEGATIVE
C Diff toxin: NEGATIVE

## 2016-03-17 MED ORDER — METRONIDAZOLE IN NACL 5-0.79 MG/ML-% IV SOLN
500.0000 mg | Freq: Once | INTRAVENOUS | Status: AC
  Administered 2016-03-17: 500 mg via INTRAVENOUS
  Filled 2016-03-17: qty 100

## 2016-03-17 MED ORDER — ONDANSETRON HCL 4 MG PO TABS
4.0000 mg | ORAL_TABLET | Freq: Four times a day (QID) | ORAL | Status: DC | PRN
  Administered 2016-03-17 – 2016-03-18 (×3): 4 mg via ORAL
  Filled 2016-03-17 (×4): qty 1

## 2016-03-17 MED ORDER — ONDANSETRON HCL 4 MG/2ML IJ SOLN
4.0000 mg | Freq: Four times a day (QID) | INTRAMUSCULAR | Status: DC | PRN
  Administered 2016-03-17: 4 mg via INTRAVENOUS
  Filled 2016-03-17: qty 2

## 2016-03-17 MED ORDER — CIPROFLOXACIN IN D5W 400 MG/200ML IV SOLN
400.0000 mg | Freq: Two times a day (BID) | INTRAVENOUS | Status: DC
  Administered 2016-03-17 – 2016-03-18 (×2): 400 mg via INTRAVENOUS
  Filled 2016-03-17 (×2): qty 200

## 2016-03-17 MED ORDER — ONDANSETRON HCL 4 MG/2ML IJ SOLN
4.0000 mg | Freq: Once | INTRAMUSCULAR | Status: AC
  Administered 2016-03-17: 4 mg via INTRAVENOUS
  Filled 2016-03-17: qty 2

## 2016-03-17 MED ORDER — ASPIRIN EC 81 MG PO TBEC
81.0000 mg | DELAYED_RELEASE_TABLET | Freq: Every day | ORAL | Status: DC
  Administered 2016-03-17: 81 mg via ORAL
  Filled 2016-03-17 (×2): qty 1

## 2016-03-17 MED ORDER — HYDROMORPHONE HCL 1 MG/ML IJ SOLN
1.0000 mg | Freq: Once | INTRAMUSCULAR | Status: AC
  Administered 2016-03-17: 1 mg via INTRAVENOUS
  Filled 2016-03-17: qty 1

## 2016-03-17 MED ORDER — CIPROFLOXACIN IN D5W 400 MG/200ML IV SOLN
400.0000 mg | Freq: Once | INTRAVENOUS | Status: AC
  Administered 2016-03-17: 400 mg via INTRAVENOUS
  Filled 2016-03-17: qty 200

## 2016-03-17 MED ORDER — DULOXETINE HCL 60 MG PO CPEP
60.0000 mg | ORAL_CAPSULE | Freq: Every day | ORAL | Status: DC
  Administered 2016-03-17 – 2016-03-19 (×3): 60 mg via ORAL
  Filled 2016-03-17 (×3): qty 1

## 2016-03-17 MED ORDER — ENOXAPARIN SODIUM 40 MG/0.4ML ~~LOC~~ SOLN
40.0000 mg | SUBCUTANEOUS | Status: DC
  Administered 2016-03-17: 40 mg via SUBCUTANEOUS
  Filled 2016-03-17 (×2): qty 0.4

## 2016-03-17 MED ORDER — SODIUM CHLORIDE 0.9 % IV SOLN: INTRAVENOUS | Status: DC

## 2016-03-17 MED ORDER — IOPAMIDOL (ISOVUE-300) INJECTION 61%
100.0000 mL | Freq: Once | INTRAVENOUS | Status: AC | PRN
  Administered 2016-03-17: 100 mL via INTRAVENOUS

## 2016-03-17 MED ORDER — METRONIDAZOLE IN NACL 5-0.79 MG/ML-% IV SOLN
500.0000 mg | Freq: Three times a day (TID) | INTRAVENOUS | Status: DC
  Administered 2016-03-17 – 2016-03-18 (×4): 500 mg via INTRAVENOUS
  Filled 2016-03-17 (×4): qty 100

## 2016-03-17 MED ORDER — ACETAMINOPHEN 325 MG PO TABS
650.0000 mg | ORAL_TABLET | Freq: Four times a day (QID) | ORAL | Status: DC | PRN
  Administered 2016-03-17 – 2016-03-18 (×6): 650 mg via ORAL
  Filled 2016-03-17 (×6): qty 2

## 2016-03-17 MED ORDER — LEVOTHYROXINE SODIUM 75 MCG PO TABS
75.0000 ug | ORAL_TABLET | Freq: Every day | ORAL | Status: DC
  Administered 2016-03-17 – 2016-03-19 (×3): 75 ug via ORAL
  Filled 2016-03-17 (×3): qty 1

## 2016-03-17 MED ORDER — HYDROMORPHONE HCL 1 MG/ML IJ SOLN
1.0000 mg | INTRAMUSCULAR | Status: DC | PRN
  Administered 2016-03-17 (×3): 1 mg via INTRAVENOUS
  Filled 2016-03-17 (×5): qty 1

## 2016-03-17 MED ORDER — ALPRAZOLAM 0.25 MG PO TABS
0.2500 mg | ORAL_TABLET | Freq: Two times a day (BID) | ORAL | Status: DC | PRN
  Administered 2016-03-17 – 2016-03-18 (×3): 0.25 mg via ORAL
  Filled 2016-03-17 (×3): qty 1

## 2016-03-17 MED ORDER — BOOST / RESOURCE BREEZE PO LIQD
1.0000 | Freq: Two times a day (BID) | ORAL | Status: DC
  Administered 2016-03-17 – 2016-03-18 (×2): 1 via ORAL

## 2016-03-17 MED ORDER — PANTOPRAZOLE SODIUM 40 MG IV SOLR
40.0000 mg | INTRAVENOUS | Status: DC
  Administered 2016-03-17 – 2016-03-19 (×3): 40 mg via INTRAVENOUS
  Filled 2016-03-17 (×3): qty 40

## 2016-03-17 MED ORDER — INSULIN ASPART 100 UNIT/ML ~~LOC~~ SOLN
0.0000 [IU] | Freq: Three times a day (TID) | SUBCUTANEOUS | Status: DC
  Administered 2016-03-18 – 2016-03-19 (×3): 1 [IU] via SUBCUTANEOUS

## 2016-03-17 MED ORDER — LORATADINE 10 MG PO TABS
10.0000 mg | ORAL_TABLET | Freq: Every day | ORAL | Status: DC
  Administered 2016-03-17 – 2016-03-19 (×3): 10 mg via ORAL
  Filled 2016-03-17 (×3): qty 1

## 2016-03-17 MED ORDER — SODIUM CHLORIDE 0.9 % IV SOLN
INTRAVENOUS | Status: AC
  Administered 2016-03-17: 02:00:00 via INTRAVENOUS

## 2016-03-17 NOTE — Progress Notes (Signed)
Initial Nutrition Assessment  DOCUMENTATION CODES:  Morbid obesity  INTERVENTION:  Boost Breeze po BID, each supplement provides 250 kcal and 9 grams of protein  Food preferences addressed.   NUTRITION DIAGNOSIS:  Inadequate oral intake related to diarrhea, vomiting, altered GI function as evidenced by pts reported inability to tolerate any PO intake x2 days.  GOAL:  Patient will meet greater than or equal to 90% of their needs  MONITOR:  Supplement acceptance, Labs, PO intake, Diet advancement (Work up)  REASON FOR ASSESSMENT:  Malnutrition Screening Tool    ASSESSMENT:  55 y/o female PMHx IBS, DM, HTN, Depression presents with vomiting and diarrhea (as much as 10-12x) starting Tuesday. Etiology Unclear. IBD potential.    Pt states that since Tuesday, everything that she has had has "gone right through me". She stated that even today she has not been able to tolerate her CL diet and reports already having gone to the bathroom >7x today.   She takes a daily mvi. She used to take b12. She reports that she has been treated for anemia in the past, though did not remember etiology. No family hx of celiac, ibd.   RD offer different food options to provide some form of comfort. She was agreeable to supplements. She is a diabetic, but states good control on oral hyperglycemics.  Per review, last BG was 83, 93. Last a1c in Jan was 6.5. Will order Breeze for now and monitor BG. Hopefully upgrade diet in near future.   She reports a fluctuating weight based upon her health problems. She says she used to weigh > 300 lbs. Her UBW "right now" is about 265.   WBC: 16.8  Recent Labs Lab 03/16/16 2330 03/17/16 0551  NA 139  --   K 3.8  --   CL 104  --   CO2 25  --   BUN 12  --   CREATININE 0.59 0.59  CALCIUM 9.3  --   GLUCOSE 112*  --    NFPE: WDL  Diet Order:  Diet clear liquid Room service appropriate?: Yes; Fluid consistency:: Thin  Skin:  Reviewed, no issues  Last BM:   5/4-diarrhea  Height:  Ht Readings from Last 1 Encounters:  03/16/16 5\' 6"  (1.676 m)   Weight:  Wt Readings from Last 1 Encounters:  03/16/16 265 lb (120.203 kg)   Wt Readings from Last 10 Encounters:  03/16/16 265 lb (120.203 kg)  11/28/15 277 lb 12.5 oz (126 kg)  08/30/15 277 lb (125.646 kg)  08/26/15 277 lb (125.646 kg)  08/28/14 263 lb (119.296 kg)  08/04/14 263 lb (119.296 kg)  05/30/14 264 lb (119.75 kg)  11/30/13 259 lb (117.482 kg)   Ideal Body Weight:  59.1 kg  BMI:  Body mass index is 42.79 kg/(m^2).  Estimated Nutritional Needs:  Kcal:  1450-1650 (12-14 kcal/kg bw) Protein:  59-71 (1-1.2 g/kg ibw) Fluid:  1.5-1.7 liters fluid + enough to replace stool losses  EDUCATION NEEDS:  No education needs identified at this time  Burtis Junes RD, LDN Clinical Nutrition Pager: [phone] 03/17/2016 3:39 PM

## 2016-03-17 NOTE — Consult Note (Signed)
Reason for Consult:  Referring Physician:  [name] is an 55 y.o. female.  HPI: Admitted thru the ED early this am. Tueday she woke up vomiting. Wednesday she began to have diarrhea. She thinks she may had went 15 to 20 loose stools.  She had dry heaves. There was no fever associated with her symptoms.  She had abdominal pain which she describes as cramping. The cramping comes and goes. Has been taking Dilaudid for the pain. She has had about 4 stools today. There is no blood or melena. She is keeping the ginger ale down.  No vomiting since admission. Underwent a CT while in the ED which revealed possible enterocolitis. She feels 40 to 50%. Admitted to [facility] in January for same: 11/27/2015: CT scan abdomen/pelvis with CM: Mild wall thickening of the transverse and descending colon is noted suggesting possible infectious or inflammatory colitis.        08/28/2014 Colonoscopy  Indications: Patient is a 55 year old Caucasian female who presents with 2 episodes of hematochezia. She gives history of colonic polyps. Family history significant for colon carcinoma in her mother but she was in her early 4s at the time of diagnosis.  Impression:  Examination performed to cecum. Moderate sigmoid colon diverticulosis and external hemorrhoids.   Past Medical History  Diagnosis Date  . Depression   . IBS (irritable bowel syndrome)   . Hypertension   . Diabetes mellitus without complication ([place])   . Thyroid disease     Past Surgical History  Procedure Laterality Date  . Cesarean section    . Tubal ligation    . Cholecystectomy    . Fracture surgery      rt ankle   . Carpal tunnel release    . Cyst removal hand    . Knee surgery      meniscus repair  . Colonoscopy N/A 08/28/2014    Procedure: COLONOSCOPY;  Surgeon: Rogene Houston, MD;  Location: [facility];  Service: Endoscopy;  Laterality: N/A;  1030    Family History  Problem Relation Age of Onset  . Cancer  Mother     colon cancer age74  . Hypertension Mother   . Stroke Mother   . Diabetes Mother   . Non-Hodgkin's lymphoma Father   . Hypertension Father   . Diabetes Father   . Kidney disease Paternal Aunt   . Cancer Paternal Aunt   . Kidney disease Paternal Uncle   . Cancer Paternal Uncle   . Stroke Maternal Grandmother     Social History:  reports that she has never smoked. She has never used smokeless tobacco. She reports that she does not drink alcohol or use illicit drugs.  Allergies: No Known Allergies  Medications: I have reviewed the patient's current medications.  Results for orders placed or performed during the hospital encounter of 03/16/16 (from the past 48 hour(s))  CBC with Differential     Status: Abnormal   Collection Time: 03/16/16 11:30 PM  Result Value Ref Range   WBC 19.9 (H) 4.0 - 10.5 K/uL   RBC 5.12 (H) 3.87 - 5.11 MIL/uL   Hemoglobin 14.3 12.0 - 15.0 g/dL   HCT 42.6 36.0 - 46.0 %   MCV 83.2 78.0 - 100.0 fL   MCH 27.9 26.0 - 34.0 pg   MCHC 33.6 30.0 - 36.0 g/dL   RDW 14.3 11.5 - 15.5 %   Platelets 324 150 - 400 K/uL   Neutrophils Relative % 74 %   Neutro  Abs 14.8 (H) 1.7 - 7.7 K/uL   Lymphocytes Relative 19 %   Lymphs Abs 3.7 0.7 - 4.0 K/uL   Monocytes Relative 6 %   Monocytes Absolute 1.2 (H) 0.1 - 1.0 K/uL   Eosinophils Relative 1 %   Eosinophils Absolute 0.2 0.0 - 0.7 K/uL   Basophils Relative 0 %   Basophils Absolute 0.0 0.0 - 0.1 K/uL  Comprehensive metabolic panel     Status: Abnormal   Collection Time: 03/16/16 11:30 PM  Result Value Ref Range   Sodium 139 135 - 145 mmol/L   Potassium 3.8 3.5 - 5.1 mmol/L   Chloride 104 101 - 111 mmol/L   CO2 25 22 - 32 mmol/L   Glucose, Bld 112 (H) 65 - 99 mg/dL   BUN 12 6 - 20 mg/dL   Creatinine, Ser 0.59 0.44 - 1.00 mg/dL   Calcium 9.3 8.9 - 10.3 mg/dL   Total Protein 8.6 (H) 6.5 - 8.1 g/dL   Albumin 4.2 3.5 - 5.0 g/dL   AST 23 15 - 41 U/L   ALT 31 14 - 54 U/L   Alkaline Phosphatase 77 38 - 126  U/L   Total Bilirubin 0.5 0.3 - 1.2 mg/dL   GFR calc non Af Amer >60 >60 mL/min   GFR calc Af Amer >60 >60 mL/min    Comment: (NOTE) The eGFR has been calculated using the CKD EPI equation. This calculation has not been validated in all clinical situations. eGFR's persistently <60 mL/min signify possible Chronic Kidney Disease.    Anion gap 10 5 - 15  Urinalysis, Routine w reflex microscopic (not at [facility])     Status: Abnormal   Collection Time: 03/17/16  1:20 AM  Result Value Ref Range   Color, Urine AMBER (A) YELLOW    Comment: BIOCHEMICALS MAY BE AFFECTED BY COLOR   APPearance CLEAR CLEAR   Specific Gravity, Urine 1.020 1.005 - 1.030   pH 5.5 5.0 - 8.0   Glucose, UA NEGATIVE NEGATIVE mg/dL   Hgb urine dipstick NEGATIVE NEGATIVE   Bilirubin Urine NEGATIVE NEGATIVE   Ketones, ur NEGATIVE NEGATIVE mg/dL   Protein, ur NEGATIVE NEGATIVE mg/dL   Nitrite NEGATIVE NEGATIVE   Leukocytes, UA NEGATIVE NEGATIVE    Comment: MICROSCOPIC NOT DONE ON URINES WITH NEGATIVE PROTEIN, BLOOD, LEUKOCYTES, NITRITE, OR GLUCOSE <1000 mg/dL.  C difficile quick scan w PCR reflex     Status: None   Collection Time: 03/17/16  1:20 AM  Result Value Ref Range   C Diff antigen NEGATIVE NEGATIVE   C Diff toxin NEGATIVE NEGATIVE   C Diff interpretation Negative for toxigenic C. difficile     Comment: VALID  CBC     Status: Abnormal   Collection Time: 03/17/16  5:51 AM  Result Value Ref Range   WBC 16.8 (H) 4.0 - 10.5 K/uL   RBC 4.39 3.87 - 5.11 MIL/uL   Hemoglobin 12.1 12.0 - 15.0 g/dL   HCT 37.2 36.0 - 46.0 %   MCV 84.7 78.0 - 100.0 fL   MCH 27.6 26.0 - 34.0 pg   MCHC 32.5 30.0 - 36.0 g/dL   RDW 14.4 11.5 - 15.5 %   Platelets 288 150 - 400 K/uL  Creatinine, serum     Status: None   Collection Time: 03/17/16  5:51 AM  Result Value Ref Range   Creatinine, Ser 0.59 0.44 - 1.00 mg/dL   GFR calc non Af Amer >60 >60 mL/min   GFR calc Af Amer >  60 >60 mL/min    Comment: (NOTE) The eGFR has been  calculated using the CKD EPI equation. This calculation has not been validated in all clinical situations. eGFR's persistently <60 mL/min signify possible Chronic Kidney Disease.   Glucose, capillary     Status: None   Collection Time: 03/17/16  8:05 AM  Result Value Ref Range   Glucose-Capillary 95 65 - 99 mg/dL    Ct Abdomen Pelvis W Contrast  03/17/2016  CLINICAL DATA:  Vomiting and diarrhea beginning yesterday, abdominal cramping, weakness and headache. History of diverticulitis, ulcerative colitis, hypertension and diabetes. EXAM: CT ABDOMEN AND PELVIS WITH CONTRAST TECHNIQUE: Multidetector CT imaging of the abdomen and pelvis was performed using the standard protocol following bolus administration of intravenous contrast. CONTRAST:  124m ISOVUE-300 IOPAMIDOL (ISOVUE-300) INJECTION 61% COMPARISON:  CT abdomen and pelvis November 27, 2015 FINDINGS: LUNG BASES: Included view of the lung bases are clear. Visualized heart and pericardium are unremarkable. SOLID ORGANS: The liver is diffusely hypodense compatible with steatosis. Status postcholecystectomy. Spleen, pancreas and adrenal glands are unremarkable. GASTROINTESTINAL TRACT: The stomach, small and large bowel are normal in course and caliber without inflammatory changes. Moderate descending and sigmoid diverticulosis. Normal appendix. A few air-fluid levels in small and large bowel. Enteric contrast has not yet reached the distal small bowel. KIDNEYS/ URINARY TRACT: Kidneys are orthotopic, demonstrating symmetric enhancement. No nephrolithiasis, hydronephrosis or solid renal masses. The unopacified ureters are normal in course and caliber. Delayed imaging through the kidneys demonstrates symmetric prompt contrast excretion within the proximal urinary collecting system. Urinary bladder is decompressed and unremarkable. PERITONEUM/RETROPERITONEUM: Aortoiliac vessels are normal in course and caliber. No lymphadenopathy by CT size criteria. Internal  reproductive organs are unremarkable. No intraperitoneal free fluid nor free air. SOFT TISSUE/OSSEOUS STRUCTURES: Non-suspicious. Probable bone island RIGHT iliac wing. Lower lumbar moderate facet arthropathy. IMPRESSION: A few scattered bowel air-fluid levels can be seen with enterocolitis. Colonic diverticulosis without CT findings of acute diverticulitis. Electronically Signed   By: CElon AlasM.D.   On: 03/17/2016 01:52    ROS Blood pressure 114/65, pulse 78, temperature 98.7 F (37.1 C), temperature source Oral, resp. rate 19, height _0  (1.676 m), weight 265 lb (120.203 kg), SpO2 97 %. Physical Exam Alert and oriented. Skin warm and dry. Oral mucosa is moist.   . Sclera anicteric, conjunctivae is pink. Thyroid not enlarged. No cervical lymphadenopathy. Lungs clear. Heart regular rate and rhythm.  Abdomen is soft. Bowel sounds are positive. No hepatomegaly. No abdominal masses felt. No tenderness.  No edema to lower extremities.   Assessment/Plan: Possibe viral gastroenteritis/vs infectious. .  Agree with Cipro and Flagyl. GI pathogen is pending. Will discuss with Dr. RLaural Golden Further recommendations to follow.   Jeraldin Fesler W 03/17/2016, 11:09 AM

## 2016-03-17 NOTE — H&P (Signed)
TRH H&P   Patient Demographics:    Kristin Bailey, is a 55 y.o. female  MRN: [id number]   DOB - November 06, 1961  Admit Date - 03/16/2016  Outpatient Primary MD for the patient is Rory Percy, MD  Referring MD/NP/PA: Dr. Leonides Schanz  Outpatient Specialists: [facility]  Patient coming from: Home  Chief Complaint  Patient presents with  . Emesis      HPI:    Kristin Bailey  is a 55 y.o. female, With history of IBS, diverticulosis, hemorrhoids who came to the hospital with complaints of vomiting and diarrhea started Tuesday morning. Patient stated that she started having abdominal cramps. Vomiting followed by watery diarrhea. She had 10-12 episodes of diarrhea. Posterolateral days. Denies having blood in it. Also complains of frequent bumps which smells like rotten eggs. She denies fever or chills. No dysuria. No chest pain or shortness of breath. Patient had colonoscopy in 2015 that showed diverticulosis and internal hemorrhoids.  In the ED, CT of the abdomen was done which showed few scattered ball of air-fluid levels which can be seen with enterocolitis.    Review of systems:    In addition to the HPI above,  No Fever-chills, No Headache, No changes with Vision or hearing, No Chest pain, Cough or Shortness of Breath, No Blood in stool or Urine, No dysuria,   A full 10 point Review of Systems was done, except as stated above, all other Review of Systems were negative.   With Past History of the following :    Past Medical History  Diagnosis Date  . Depression   . IBS (irritable bowel syndrome)   . Hypertension   . Diabetes mellitus without complication ([place])   . Thyroid disease       Past Surgical History  Procedure Laterality Date  . Cesarean section    . Tubal ligation    . Cholecystectomy    . Fracture surgery      rt ankle   . Carpal tunnel release    . Cyst removal hand    . Knee  surgery      meniscus repair  . Colonoscopy N/A 08/28/2014    Procedure: COLONOSCOPY;  Surgeon: Rogene Houston, MD;  Location: [facility];  Service: Endoscopy;  Laterality: N/A;  1030      Social History:     Social History  Substance Use Topics  . Smoking status: Never Smoker   . Smokeless tobacco: Never Used  . Alcohol Use: No     Lives - At home with husband  Mobility - Fully mobile without assistive device    Family History :     Family History  Problem Relation Age of Onset  . Cancer Mother     colon cancer age74  . Hypertension Mother   . Stroke Mother   . Diabetes Mother   . Non-Hodgkin's lymphoma Father   . Hypertension Father   . Diabetes Father   . Kidney disease Paternal Aunt   .  Cancer Paternal Aunt   . Kidney disease Paternal Uncle   . Cancer Paternal Uncle   . Stroke Maternal Grandmother      Home Medications:   Prior to Admission medications   Medication Sig Start Date End Date Taking? Authorizing Provider  acetaminophen (TYLENOL) 650 MG CR tablet Take 1,300 mg by mouth 2 (two) times daily as needed for pain.    Historical Provider, MD  albuterol (PROVENTIL HFA;VENTOLIN HFA) 108 (90 BASE) MCG/ACT inhaler Inhale 2 puffs into the lungs every 4 (four) hours as needed for wheezing or shortness of breath.    Historical Provider, MD  ALPRAZolam Duanne Moron) 0.25 MG tablet Take 0.25 mg by mouth 2 (two) times daily as needed for anxiety.    Historical Provider, MD  amoxicillin-clavulanate (AUGMENTIN) 875-125 MG tablet Take 1 tablet by mouth 2 (two) times daily. 11/30/15   Robbie Lis, MD  aspirin EC 81 MG tablet Take 81 mg by mouth daily.    Historical Provider, MD  Dulaglutide (TRULICITY) [id number] [id number] SOPN Inject 0.5 mLs into the skin once a week. On  Monday's.    Historical Provider, MD  DULoxetine (CYMBALTA) 60 MG capsule Take 60 mg by mouth daily.    Historical Provider, MD  glipiZIDE (GLUCOTROL) 5 MG tablet Take 5 mg by mouth 2 (two) times daily.      Historical Provider, MD  hyoscyamine (LEVSIN SL) 0.125 MG SL tablet Place 1 tablet (0.125 mg total) under the tongue every 4 (four) hours as needed for cramping. 11/30/15   Robbie Lis, MD  hyoscyamine (LEVSIN, ANASPAZ) 0.125 MG tablet Take 0.125 mg by mouth every 4 (four) hours as needed for cramping.     Historical Provider, MD  levothyroxine (SYNTHROID, LEVOTHROID) 75 MCG tablet Take 75 mcg by mouth daily before breakfast.    Historical Provider, MD  lisinopril-hydrochlorothiazide (PRINZIDE,ZESTORETIC) 20-12.5 MG per tablet Take 1 tablet by mouth daily.    Historical Provider, MD  loratadine (CLARITIN) 10 MG tablet Take 10 mg by mouth daily.    Historical Provider, MD  Multiple Vitamin (MULTIVITAMIN WITH MINERALS) TABS tablet Take 1 tablet by mouth daily.    Historical Provider, MD  omeprazole (PRILOSEC OTC) 20 MG tablet Take 1 tablet (20 mg total) by mouth daily. 08/30/15   Carmin Muskrat, MD  ondansetron (ZOFRAN ODT) 4 MG disintegrating tablet Take 1 tablet (4 mg total) by mouth every 8 (eight) hours as needed for nausea or vomiting. 11/30/15   Robbie Lis, MD     Allergies:    No Known Allergies   Physical Exam:   Vitals  Blood pressure 114/65, pulse 78, temperature 98.7 F (37.1 C), temperature source Oral, resp. rate 19, height 5\' 6"  (1.676 m), weight 120.203 kg (265 lb), SpO2 97 %.   1. General Morbidly obese female* lying in bed in NAD, cooperative with exam  2. Normal affect and insight, Not Suicidal or Homicidal, Awake Alert, Oriented X 3.  3. No F.N deficits, ALL C.Nerves Intact, Strength 5/5 all 4 extremities, Sensation intact all 4 extremities, Plantars down going.  4. Ears and Eyes appear Normal, Conjunctivae clear, PERRLA. Moist Oral Mucosa.  5. Supple Neck, No JVD, No cervical lymphadenopathy appriciated, No Carotid Bruits.  6. Symmetrical Chest wall movement, Good air movement bilaterally, CTAB.  7. RRR, No Gallops, Rubs or Murmurs, No Parasternal Heave.  8.  Positive Bowel Sounds, Abdomen Soft, Generalized tenderness to palpation, No organomegaly appriciated,No rebound -guarding or rigidity.  9.  No Cyanosis, Normal  Skin Turgor, No Skin Rash or Bruise.  10. Good muscle tone,  joints appear normal , no effusions, Normal ROM.       Data Review:    CBC  Recent Labs Lab 03/16/16 2330  WBC 19.9*  HGB 14.3  HCT 42.6  PLT 324  MCV 83.2  MCH 27.9  MCHC 33.6  RDW 14.3  LYMPHSABS 3.7  MONOABS 1.2*  EOSABS 0.2  BASOSABS 0.0   ------------------------------------------------------------------------------------------------------------------  Chemistries   Recent Labs Lab 03/16/16 2330  NA 139  K 3.8  CL 104  CO2 25  GLUCOSE 112*  BUN 12  CREATININE 0.59  CALCIUM 9.3  AST 23  ALT 31  ALKPHOS 77  BILITOT 0.5   ------------------------------------------------------------------------------------------------------------------ estimated creatinine clearance is 106.2 mL/min (by C-G formula based on Cr of 0.59). ------------------------------------------------------------------------------------------------------------------   ---------------------------------------------------------------------------------------------------------------  Urinalysis    Component Value Date/Time   COLORURINE AMBER* 03/17/2016 0120   APPEARANCEUR CLEAR 03/17/2016 0120   LABSPEC 1.020 03/17/2016 0120   PHURINE 5.5 03/17/2016 0120   GLUCOSEU NEGATIVE 03/17/2016 0120   HGBUR NEGATIVE 03/17/2016 0120   BILIRUBINUR NEGATIVE 03/17/2016 0120   KETONESUR NEGATIVE 03/17/2016 0120   PROTEINUR NEGATIVE 03/17/2016 0120   UROBILINOGEN 0.2 08/30/2015 1420   NITRITE NEGATIVE 03/17/2016 0120   LEUKOCYTESUR NEGATIVE 03/17/2016 0120    ----------------------------------------------------------------------------------------------------------------   Imaging Results:    Ct Abdomen Pelvis W Contrast  03/17/2016  CLINICAL DATA:  Vomiting and diarrhea  beginning yesterday, abdominal cramping, weakness and headache. History of diverticulitis, ulcerative colitis, hypertension and diabetes. EXAM: CT ABDOMEN AND PELVIS WITH CONTRAST TECHNIQUE: Multidetector CT imaging of the abdomen and pelvis was performed using the standard protocol following bolus administration of intravenous contrast. CONTRAST:  174mL ISOVUE-300 IOPAMIDOL (ISOVUE-300) INJECTION 61% COMPARISON:  CT abdomen and pelvis November 27, 2015 FINDINGS: LUNG BASES: Included view of the lung bases are clear. Visualized heart and pericardium are unremarkable. SOLID ORGANS: The liver is diffusely hypodense compatible with steatosis. Status postcholecystectomy. Spleen, pancreas and adrenal glands are unremarkable. GASTROINTESTINAL TRACT: The stomach, small and large bowel are normal in course and caliber without inflammatory changes. Moderate descending and sigmoid diverticulosis. Normal appendix. A few air-fluid levels in small and large bowel. Enteric contrast has not yet reached the distal small bowel. KIDNEYS/ URINARY TRACT: Kidneys are orthotopic, demonstrating symmetric enhancement. No nephrolithiasis, hydronephrosis or solid renal masses. The unopacified ureters are normal in course and caliber. Delayed imaging through the kidneys demonstrates symmetric prompt contrast excretion within the proximal urinary collecting system. Urinary bladder is decompressed and unremarkable. PERITONEUM/RETROPERITONEUM: Aortoiliac vessels are normal in course and caliber. No lymphadenopathy by CT size criteria. Internal reproductive organs are unremarkable. No intraperitoneal free fluid nor free air. SOFT TISSUE/OSSEOUS STRUCTURES: Non-suspicious. Probable bone island RIGHT iliac wing. Lower lumbar moderate facet arthropathy. IMPRESSION: A few scattered bowel air-fluid levels can be seen with enterocolitis. Colonic diverticulosis without CT findings of acute diverticulitis. Electronically Signed   By: Elon Alas  M.D.   On: 03/17/2016 01:52       Assessment & Plan:    Active Problems:   Controlled type 2 diabetes mellitus without complication, without long-term current use of insulin (HCC)   Enterocolitis   Gastroenteritis, infectious     1. Gastroenteritis- patient presenting with nausea vomiting and diarrhea for the past 2 days, with elevated white count. CT scan showed enterocolitis, will continue patient on Cipro and Flagyl. Zofran when necessary for nausea and vomiting. Dilaudid when necessary for pain. 2. Diabetes mellitus- hold glipizide  at this time, and start sliding scale insulin with NovoLog. 3. GERD- continue Protonix 4. Hypertension- blood pressure is stable, will hold lisinopril/HCTZ at this time.   DVT Prophylaxis-   Lovenox   AM Labs Ordered, also please review Full Orders  Family Communication: Admission, patients condition and plan of care including tests being ordered have been discussed with the patient and *Her husband at bedside who indicate understanding and agree with the plan and Code Status.  Code Status Full code  Likely DC to  Home  Admission status: *Observation  Time spent in minutes : 55 minutes   Syrina Wake S M.D on 03/17/2016 at 3:17 AM  Between 7am to 7pm - Pager - [phone]. After 7pm go to [email or web address] - password [facility]  [facility] - Office  [phone]

## 2016-03-17 NOTE — Progress Notes (Signed)
PROGRESS NOTE        PATIENT DETAILS Name: Kristin Bailey Age: 55 y.o. Sex: female Date of Birth: 1961-05-11 Admit Date: 03/16/2016 Admitting Physician Oswald Hillock, MD [id number], Lennette Bihari, MD Outpatient Specialists:Dr Corbin Ade, Dr Paulita Fujita  Brief Narrative: Patient is a 55 y.o. female with past medical history of irritable bowel syndrome, diabetes, gastroesophageal reflux disease admitted with diffuse abdominal pain along with vomiting and diarrhea for the past 2 days. Pain is described as colicky in nature. No blood or mucus in stools. CT scan of the abdomen on admission was consistent with enterocolitis. See below for further details  Subjective: Feels slightly better than on admission, no vomiting. Has had 2 episodes of nonbloody diarrhea this morning.  Assessment/Plan: Active Problems: Enterocolitis: Some mild improvement since admission, leukocytosis downtrending. Continue empiric Cipro/Flagyl and other supportive measures. She denies any sick contacts, C. difficile PCR negative, however GI pathogen panel pending. This is second episode of colitis this year alone, in the past CT scans there has been some question of inflammatory bowel disease. I suspect she will likely need a GI consultation for further evaluation.   Type 2 diabetes: CBGs currently stable with SSI, continue to hold oral hypoglycemic agents.   Hypothyroidism: Continue Synthroid  Hypertension: BP well-controlled-continue to hold antihypertensives for now  GERD: Continue PPI  Anxiety/depression: Continue as needed Xanax and Cymbalta  History of irritable bowel syndrome: Continue to hold Levsin and Imodium till infection ruled out.   DVT Prophylaxis: Prophylactic Lovenox  Code Status: Full code   Family Communication: None at bedside  Disposition Plan: Remain inpatient-home in 1-2 days  Antimicrobial agents: IV Cipro 5/4>> IV Flagyl 5/4>>  Procedures: None  CONSULTS:   GI  Time spent: 25 minutes-Greater than 50% of this time was spent in counseling, explanation of diagnosis, planning of further management, and coordination of care.  MEDICATIONS: Anti-infectives    Start     Dose/Rate Route Frequency Ordered Stop   03/17/16 1400  ciprofloxacin (CIPRO) IVPB 400 mg     400 mg 200 mL/hr over 60 Minutes Intravenous Every 12 hours 03/17/16 0328     03/17/16 1000  metroNIDAZOLE (FLAGYL) IVPB 500 mg     500 mg 100 mL/hr over 60 Minutes Intravenous Every 8 hours 03/17/16 0328     03/17/16 0230  metroNIDAZOLE (FLAGYL) IVPB 500 mg     500 mg 100 mL/hr over 60 Minutes Intravenous  Once 03/17/16 0223 03/17/16 0330   03/17/16 0230  ciprofloxacin (CIPRO) IVPB 400 mg     400 mg 200 mL/hr over 60 Minutes Intravenous  Once 03/17/16 0223 03/17/16 0328      Scheduled Meds: . sodium chloride   Intravenous STAT  . aspirin EC  81 mg Oral Daily  . ciprofloxacin  400 mg Intravenous Q12H  . DULoxetine  60 mg Oral Daily  . enoxaparin (LOVENOX) injection  40 mg Subcutaneous Q24H  . insulin aspart  0-9 Units Subcutaneous TID WC  . levothyroxine  75 mcg Oral QAC breakfast  . loratadine  10 mg Oral Daily  . metronidazole  500 mg Intravenous Q8H  . pantoprazole (PROTONIX) IV  40 mg Intravenous Q24H   Continuous Infusions: . sodium chloride 125 mL/hr at 03/17/16 0221   PRN Meds:.ALPRAZolam, HYDROmorphone (DILAUDID) injection, ondansetron **OR** ondansetron (ZOFRAN) IV   PHYSICAL EXAM: Vital signs: Filed Vitals:  03/16/16 2133 03/17/16 0158  BP: 136/51 114/65  Pulse: 87 78  Temp: 98.4 F (36.9 C) 98.7 F (37.1 C)  TempSrc: Oral Oral  Resp: 20 19  Height: 5\' 6"  (1.676 m)   Weight: 120.203 kg (265 lb)   SpO2: 100% 97%   Filed Weights   03/16/16 2133  Weight: 120.203 kg (265 lb)   Body mass index is 42.79 kg/(m^2).   Gen Exam: Awake and alert with clear speech. Not in any distress  Neck: Supple, No JVD.   Chest: B/L Clear.   CVS: S1 S2 Regular, no  murmurs.  Abdomen: soft, BS +, mildly tender in the periumbilical area, non distended. No rebound or rigidity Extremities: no edema, lower extremities warm to touch. Neurologic: Non Focal.   Skin: No Rash or lesions   Wounds: N/A.   LABORATORY DATA: CBC:  Recent Labs Lab 03/16/16 2330 03/17/16 0551  WBC 19.9* 16.8*  NEUTROABS 14.8*  --   HGB 14.3 12.1  HCT 42.6 37.2  MCV 83.2 84.7  PLT 324 [id number]    Basic Metabolic Panel:  Recent Labs Lab 03/16/16 2330 03/17/16 0551  NA 139  --   K 3.8  --   CL 104  --   CO2 25  --   GLUCOSE 112*  --   BUN 12  --   CREATININE 0.59 0.59  CALCIUM 9.3  --     GFR: Estimated Creatinine Clearance: 106.2 mL/min (by C-G formula based on Cr of 0.59).  Liver Function Tests:  Recent Labs Lab 03/16/16 2330  AST 23  ALT 31  ALKPHOS 77  BILITOT 0.5  PROT 8.6*  ALBUMIN 4.2   No results for input(s): LIPASE, AMYLASE in the last 168 hours. No results for input(s): AMMONIA in the last 168 hours.  Coagulation Profile: No results for input(s): INR, PROTIME in the last 168 hours.  Cardiac Enzymes: No results for input(s): CKTOTAL, CKMB, CKMBINDEX, TROPONINI in the last 168 hours.  BNP (last 3 results) No results for input(s): PROBNP in the last 8760 hours.  HbA1C: No results for input(s): HGBA1C in the last 72 hours.  CBG:  Recent Labs Lab 03/17/16 0805  GLUCAP 95    Lipid Profile: No results for input(s): CHOL, HDL, LDLCALC, TRIG, CHOLHDL, LDLDIRECT in the last 72 hours.  Thyroid Function Tests: No results for input(s): TSH, T4TOTAL, FREET4, T3FREE, THYROIDAB in the last 72 hours.  Anemia Panel: No results for input(s): VITAMINB12, FOLATE, FERRITIN, TIBC, IRON, RETICCTPCT in the last 72 hours.  Urine analysis:    Component Value Date/Time   COLORURINE AMBER* 03/17/2016 0120   APPEARANCEUR CLEAR 03/17/2016 0120   LABSPEC 1.020 03/17/2016 0120   PHURINE 5.5 03/17/2016 0120   GLUCOSEU NEGATIVE 03/17/2016 0120    HGBUR NEGATIVE 03/17/2016 0120   BILIRUBINUR NEGATIVE 03/17/2016 0120   KETONESUR NEGATIVE 03/17/2016 0120   PROTEINUR NEGATIVE 03/17/2016 0120   UROBILINOGEN 0.2 08/30/2015 1420   NITRITE NEGATIVE 03/17/2016 0120   LEUKOCYTESUR NEGATIVE 03/17/2016 0120    Sepsis Labs: Lactic Acid, Venous    Component Value Date/Time   LATICACIDVEN 2.0 11/28/2015 0015    MICROBIOLOGY: Recent Results (from the past 240 hour(s))  C difficile quick scan w PCR reflex     Status: None   Collection Time: 03/17/16  1:20 AM  Result Value Ref Range Status   C Diff antigen NEGATIVE NEGATIVE Final   C Diff toxin NEGATIVE NEGATIVE Final   C Diff interpretation Negative for toxigenic C. difficile  Final    Comment: VALID    RADIOLOGY STUDIES/RESULTS: Ct Abdomen Pelvis W Contrast  03/17/2016  CLINICAL DATA:  Vomiting and diarrhea beginning yesterday, abdominal cramping, weakness and headache. History of diverticulitis, ulcerative colitis, hypertension and diabetes. EXAM: CT ABDOMEN AND PELVIS WITH CONTRAST TECHNIQUE: Multidetector CT imaging of the abdomen and pelvis was performed using the standard protocol following bolus administration of intravenous contrast. CONTRAST:  128mL ISOVUE-300 IOPAMIDOL (ISOVUE-300) INJECTION 61% COMPARISON:  CT abdomen and pelvis November 27, 2015 FINDINGS: LUNG BASES: Included view of the lung bases are clear. Visualized heart and pericardium are unremarkable. SOLID ORGANS: The liver is diffusely hypodense compatible with steatosis. Status postcholecystectomy. Spleen, pancreas and adrenal glands are unremarkable. GASTROINTESTINAL TRACT: The stomach, small and large bowel are normal in course and caliber without inflammatory changes. Moderate descending and sigmoid diverticulosis. Normal appendix. A few air-fluid levels in small and large bowel. Enteric contrast has not yet reached the distal small bowel. KIDNEYS/ URINARY TRACT: Kidneys are orthotopic, demonstrating symmetric enhancement.  No nephrolithiasis, hydronephrosis or solid renal masses. The unopacified ureters are normal in course and caliber. Delayed imaging through the kidneys demonstrates symmetric prompt contrast excretion within the proximal urinary collecting system. Urinary bladder is decompressed and unremarkable. PERITONEUM/RETROPERITONEUM: Aortoiliac vessels are normal in course and caliber. No lymphadenopathy by CT size criteria. Internal reproductive organs are unremarkable. No intraperitoneal free fluid nor free air. SOFT TISSUE/OSSEOUS STRUCTURES: Non-suspicious. Probable bone island RIGHT iliac wing. Lower lumbar moderate facet arthropathy. IMPRESSION: A few scattered bowel air-fluid levels can be seen with enterocolitis. Colonic diverticulosis without CT findings of acute diverticulitis. Electronically Signed   By: Elon Alas M.D.   On: 03/17/2016 01:52       Oren Binet, MD  [facility] Pager:[phone]  If 7PM-7AM, please contact night-coverage [email or web address] Password [facility] 03/17/2016, 9:38 AM

## 2016-03-17 NOTE — H&P (Signed)
error 

## 2016-03-18 DIAGNOSIS — A09 Infectious gastroenteritis and colitis, unspecified: Secondary | ICD-10-CM

## 2016-03-18 DIAGNOSIS — I10 Essential (primary) hypertension: Secondary | ICD-10-CM

## 2016-03-18 DIAGNOSIS — K589 Irritable bowel syndrome without diarrhea: Secondary | ICD-10-CM

## 2016-03-18 DIAGNOSIS — K529 Noninfective gastroenteritis and colitis, unspecified: Secondary | ICD-10-CM

## 2016-03-18 LAB — CBC
HCT: 36.9 % (ref 36.0–46.0)
Hemoglobin: 12 g/dL (ref 12.0–15.0)
MCH: 27.3 pg (ref 26.0–34.0)
MCHC: 32.5 g/dL (ref 30.0–36.0)
MCV: 83.9 fL (ref 78.0–100.0)
PLATELETS: 301 10*3/uL (ref 150–400)
RBC: 4.4 MIL/uL (ref 3.87–5.11)
RDW: 14.2 % (ref 11.5–15.5)
WBC: 11.1 10*3/uL — AB (ref 4.0–10.5)

## 2016-03-18 LAB — BASIC METABOLIC PANEL
Anion gap: 6 (ref 5–15)
BUN: 6 mg/dL (ref 6–20)
CALCIUM: 8.8 mg/dL — AB (ref 8.9–10.3)
CO2: 25 mmol/L (ref 22–32)
Chloride: 107 mmol/L (ref 101–111)
Creatinine, Ser: 0.57 mg/dL (ref 0.44–1.00)
GFR calc Af Amer: >60 mL/min (ref >60)
GLUCOSE: 97 mg/dL (ref 65–99)
Potassium: 3.5 mmol/L (ref 3.5–5.1)
Sodium: 138 mmol/L (ref 135–145)

## 2016-03-18 LAB — GLUCOSE, CAPILLARY
GLUCOSE-CAPILLARY: 96 mg/dL (ref 65–99)
Glucose-Capillary: 135 mg/dL — ABNORMAL HIGH (ref 65–99)
Glucose-Capillary: 123 mg/dL — ABNORMAL HIGH (ref 65–99)
Glucose-Capillary: 119 mg/dL — ABNORMAL HIGH (ref 65–99)

## 2016-03-18 LAB — HEMOGLOBIN A1C
HEMOGLOBIN A1C: 6.5 % — AB (ref 4.8–5.6)
MEAN PLASMA GLUCOSE: 140 mg/dL

## 2016-03-18 MED ORDER — ENSURE ENLIVE PO LIQD
237.0000 mL | Freq: Two times a day (BID) | ORAL | Status: DC
  Administered 2016-03-18: 237 mL via ORAL

## 2016-03-18 MED ORDER — ENOXAPARIN SODIUM 40 MG/0.4ML ~~LOC~~ SOLN
40.0000 mg | SUBCUTANEOUS | Status: DC
Start: 2016-03-18 — End: 2016-03-18
  Administered 2016-03-18: 40 mg via SUBCUTANEOUS

## 2016-03-18 MED ORDER — SODIUM CHLORIDE 0.9% FLUSH: 3.0000 mL | INTRAVENOUS | Status: DC | PRN

## 2016-03-18 MED ORDER — ALBUTEROL SULFATE (2.5 MG/3ML) 0.083% IN NEBU: 2.5000 mg | INHALATION_SOLUTION | RESPIRATORY_TRACT | Status: DC | PRN

## 2016-03-18 MED ORDER — SODIUM CHLORIDE 0.9 % IV SOLN: 250.0000 mL | INTRAVENOUS | Status: DC | PRN

## 2016-03-18 MED ORDER — LOPERAMIDE HCL 2 MG PO CAPS
2.0000 mg | ORAL_CAPSULE | ORAL | Status: DC | PRN
  Administered 2016-03-18 (×2): 2 mg via ORAL
  Filled 2016-03-18 (×2): qty 1

## 2016-03-18 MED ORDER — ENOXAPARIN SODIUM 40 MG/0.4ML ~~LOC~~ SOLN: 40.0000 mg | SUBCUTANEOUS | Status: DC

## 2016-03-18 MED ORDER — SODIUM CHLORIDE 0.9% FLUSH
3.0000 mL | Freq: Two times a day (BID) | INTRAVENOUS | Status: DC
  Administered 2016-03-18 (×2): 3 mL via INTRAVENOUS

## 2016-03-18 MED ORDER — HYOSCYAMINE SULFATE 0.125 MG SL SUBL
0.1250 mg | SUBLINGUAL_TABLET | SUBLINGUAL | Status: DC | PRN
  Administered 2016-03-18 (×2): 0.125 mg via ORAL
  Filled 2016-03-18 (×2): qty 1

## 2016-03-18 NOTE — Progress Notes (Signed)
  Subjective:  Patient feels better. She is hungry this morning. She complains of mild mid abdominal pain and bloating. She had one bowel movement earlier today 3 was small pasty stool. No melena or rectal bleeding reported.    Objective: Blood pressure 127/80, pulse 71, temperature 97.9 F (36.6 C), temperature source Oral, resp. rate 20, height 5\' 6"  (1.676 m), weight 265 lb (120.203 kg), SpO2 99 %. Patient is alert and in no acute distress. Abdomen is full. Bowel sounds are normal. Abdomen is soft with mild mid abdominal tenderness. No organomegaly or masses.  No LE edema or clubbing noted.  Labs/studies Results:   Recent Labs  03/16/16 2330 03/17/16 0551 03/18/16 0528  WBC 19.9* 16.8* 11.1*  HGB 14.3 12.1 12.0  HCT 42.6 37.2 36.9  PLT 324 288 301    BMET   Recent Labs  03/16/16 2330 03/17/16 0551 03/18/16 0528  NA 139  --  138  K 3.8  --  3.5  CL 104  --  107  CO2 25  --  25  GLUCOSE 112*  --  97  BUN 12  --  6  CREATININE 0.59 0.59 0.57  CALCIUM 9.3  --  8.8*    LFT   Recent Labs  03/16/16 2330  PROT 8.6*  ALBUMIN 4.2  AST 23  ALT 31  ALKPHOS 77  BILITOT 0.5    GI pathogen panel is negative.  Assessment:  #1. Recurrent syndrome of nausea vomiting abdominal pain and diarrhea. No evidence of enteric or colonic infection. Need to rule out other causes of such such as acute intermittent porphyria as well as celiac disease. Testing is all ready been ordered. Carcinoid syndrome can have similar presentation she does not have any abnormality on CT but evidence of ongoing spasm or skin rash. Therefore this diagnosis is unlikely. She may also have gastroparesis given history of foul-smelling burps.   Recommendations:  Await results of celiac antibody panel and urinary studies for AIP. Discontinue antibiotics. Advance diet to full liquids CBC with differential in a.m(checking for eosinophilia) Diagnostic esophagogastroduodenoscopy in a.m. Discontinue  aspirin and hold a.m. Enoxaparin  dose.

## 2016-03-18 NOTE — Progress Notes (Addendum)
PROGRESS NOTE        PATIENT DETAILS Name: Kristin Bailey Age: 55 y.o. Sex: female Date of Birth: 1960/12/25 Admit Date: 03/16/2016 Admitting Physician Oswald Hillock, MD [id number], Lennette Bihari, MD Outpatient Specialists:Dr Corbin Ade, Dr Paulita Fujita  Brief Narrative: Patient is a 55 y.o. female with past medical history of irritable bowel syndrome, diabetes, gastroesophageal reflux disease admitted with diffuse abdominal pain along with vomiting and diarrhea for the past 2 days. No blood or mucus in stools. CT scan of the abdomen on admission was consistent with enterocolitis. See below for further details  Subjective: Feels much better than on admission. No vomiting, no diarrhea. Feels nauseous.  Assessment/Plan: Active Problems: Recurrent colitis/enteritis:? Etiology. Much improved with supportive care and empiric Cipro and Flagyl. Stool C. difficile PCR and GI pathogen panel continued to be negative. Leukocytosis has essentially resolved. She has had numerous episodes of nausea, vomiting, diarrhea and abdominal pain over the past few years. GI has been consulted, workup for acute intermittent porphyria and celiac disease in progress. 24 urine collection for amniolevulinic acid in progress. GI planning on EGD with small bowel biopsy 5/6. Suspect if clinical improvement continues, she can be discharged post endoscopy. I do not think at this time she needs antibiotics-as an GI pathogen panel is negative-hence will discontinue both Cipro and Flagyl.    Type 2 diabetes: CBGs currently stable with SSI, continue to hold oral hypoglycemic agents.   Hypothyroidism: Continue Synthroid  Hypertension: BP well-controlled-continue to hold antihypertensives for now  GERD: Continue PPI  Anxiety/depression: Continue as needed Xanax and Cymbalta  History of irritable bowel syndrome: We will resume Levsin-she is clinically much improved today  DVT Prophylaxis: Prophylactic  Lovenox  Code Status: Full code   Family Communication: None at bedside  Disposition Plan: Remain inpatient-likley 5/6 after EGD is complete  Antimicrobial agents: IV Cipro 5/4>>5/5 IV Flagyl 5/4>>5/5  Procedures: None  CONSULTS:  GI  Time spent: 25 minutes-Greater than 50% of this time was spent in counseling, explanation of diagnosis, planning of further management, and coordination of care.  MEDICATIONS: Anti-infectives    Start     Dose/Rate Route Frequency Ordered Stop   03/17/16 1400  ciprofloxacin (CIPRO) IVPB 400 mg  Status:  Discontinued     400 mg 200 mL/hr over 60 Minutes Intravenous Every 12 hours 03/17/16 0328 03/18/16 0845   03/17/16 1000  metroNIDAZOLE (FLAGYL) IVPB 500 mg  Status:  Discontinued     500 mg 100 mL/hr over 60 Minutes Intravenous Every 8 hours 03/17/16 0328 03/18/16 0845   03/17/16 0230  metroNIDAZOLE (FLAGYL) IVPB 500 mg     500 mg 100 mL/hr over 60 Minutes Intravenous  Once 03/17/16 0223 03/17/16 0330   03/17/16 0230  ciprofloxacin (CIPRO) IVPB 400 mg     400 mg 200 mL/hr over 60 Minutes Intravenous  Once 03/17/16 0223 03/17/16 0328      Scheduled Meds: . DULoxetine  60 mg Oral Daily  . enoxaparin (LOVENOX) injection  40 mg Subcutaneous Q24H  . feeding supplement  1 Container Oral BID BM  . insulin aspart  0-9 Units Subcutaneous TID WC  . levothyroxine  75 mcg Oral QAC breakfast  . loratadine  10 mg Oral Daily  . pantoprazole (PROTONIX) IV  40 mg Intravenous Q24H  . sodium chloride flush  3 mL Intravenous Q12H   Continuous Infusions:  PRN Meds:.sodium chloride, acetaminophen, ALPRAZolam, HYDROmorphone (DILAUDID) injection, ondansetron **OR** ondansetron (ZOFRAN) IV, sodium chloride flush   PHYSICAL EXAM: Vital signs: Filed Vitals:   03/17/16 0158 03/17/16 1353 03/17/16 2103 03/18/16 0500  BP: 114/65 106/53 117/65 127/80  Pulse: 78 72 69 71  Temp: 98.7 F (37.1 C) 98 F (36.7 C)  97.9 F (36.6 C)  TempSrc: Oral   Oral   Resp: 19 18 19 20   Height:      Weight:      SpO2: 97% 98% 97% 99%   Filed Weights   03/16/16 2133  Weight: 120.203 kg (265 lb)   Body mass index is 42.79 kg/(m^2).   Gen Exam: Awake and alert with clear speech. Not in any distress  Neck: Supple, No JVD.   Chest: B/L Clear.   CVS: S1 S2 Regular, no murmurs.  Abdomen: soft, BS +, mildly tender in the periumbilical area, non distended. No rebound or rigidity Extremities: no edema, lower extremities warm to touch. Neurologic: Non Focal.   Skin: No Rash or lesions   Wounds: N/A.   LABORATORY DATA: CBC:  Recent Labs Lab 03/16/16 2330 03/17/16 0551 03/18/16 0528  WBC 19.9* 16.8* 11.1*  NEUTROABS 14.8*  --   --   HGB 14.3 12.1 12.0  HCT 42.6 37.2 36.9  MCV 83.2 84.7 83.9  PLT 324 288 [id number]    Basic Metabolic Panel:  Recent Labs Lab 03/16/16 2330 03/17/16 0551 03/18/16 0528  NA 139  --  138  K 3.8  --  3.5  CL 104  --  107  CO2 25  --  25  GLUCOSE 112*  --  97  BUN 12  --  6  CREATININE 0.59 0.59 0.57  CALCIUM 9.3  --  8.8*    GFR: Estimated Creatinine Clearance: 106.2 mL/min (by C-G formula based on Cr of 0.57).  Liver Function Tests:  Recent Labs Lab 03/16/16 2330  AST 23  ALT 31  ALKPHOS 77  BILITOT 0.5  PROT 8.6*  ALBUMIN 4.2   No results for input(s): LIPASE, AMYLASE in the last 168 hours. No results for input(s): AMMONIA in the last 168 hours.  Coagulation Profile: No results for input(s): INR, PROTIME in the last 168 hours.  Cardiac Enzymes: No results for input(s): CKTOTAL, CKMB, CKMBINDEX, TROPONINI in the last 168 hours.  BNP (last 3 results) No results for input(s): PROBNP in the last 8760 hours.  HbA1C:  Recent Labs  03/16/16 2330  HGBA1C 6.5*    CBG:  Recent Labs Lab 03/17/16 0805 03/17/16 1120 03/17/16 1547 03/17/16 2101 03/18/16 0707  GLUCAP 95 83 110* 91 96    Lipid Profile: No results for input(s): CHOL, HDL, LDLCALC, TRIG, CHOLHDL, LDLDIRECT in the last 72  hours.  Thyroid Function Tests: No results for input(s): TSH, T4TOTAL, FREET4, T3FREE, THYROIDAB in the last 72 hours.  Anemia Panel: No results for input(s): VITAMINB12, FOLATE, FERRITIN, TIBC, IRON, RETICCTPCT in the last 72 hours.  Urine analysis:    Component Value Date/Time   COLORURINE AMBER* 03/17/2016 0120   APPEARANCEUR CLEAR 03/17/2016 0120   LABSPEC 1.020 03/17/2016 0120   PHURINE 5.5 03/17/2016 0120   GLUCOSEU NEGATIVE 03/17/2016 0120   HGBUR NEGATIVE 03/17/2016 0120   BILIRUBINUR NEGATIVE 03/17/2016 0120   KETONESUR NEGATIVE 03/17/2016 0120   PROTEINUR NEGATIVE 03/17/2016 0120   UROBILINOGEN 0.2 08/30/2015 1420   NITRITE NEGATIVE 03/17/2016 0120   LEUKOCYTESUR NEGATIVE 03/17/2016 0120    Sepsis Labs: Lactic Acid, Venous  Component Value Date/Time   LATICACIDVEN 2.0 11/28/2015 0015    MICROBIOLOGY: Recent Results (from the past 240 hour(s))  Gastrointestinal Panel by PCR , Stool     Status: None   Collection Time: 03/17/16  1:20 AM  Result Value Ref Range Status   Campylobacter species NOT DETECTED NOT DETECTED Final   Plesimonas shigelloides NOT DETECTED NOT DETECTED Final   Salmonella species NOT DETECTED NOT DETECTED Final   Yersinia enterocolitica NOT DETECTED NOT DETECTED Final   Vibrio species NOT DETECTED NOT DETECTED Final   Vibrio cholerae NOT DETECTED NOT DETECTED Final   Enteroaggregative E coli (EAEC) NOT DETECTED NOT DETECTED Final   Enteropathogenic E coli (EPEC) NOT DETECTED NOT DETECTED Final   Enterotoxigenic E coli (ETEC) NOT DETECTED NOT DETECTED Final   Shiga like toxin producing E coli (STEC) NOT DETECTED NOT DETECTED Final   E. coli O157 NOT DETECTED NOT DETECTED Final   Shigella/Enteroinvasive E coli (EIEC) NOT DETECTED NOT DETECTED Final   Cryptosporidium NOT DETECTED NOT DETECTED Final   Cyclospora cayetanensis NOT DETECTED NOT DETECTED Final   Entamoeba histolytica NOT DETECTED NOT DETECTED Final   Giardia lamblia NOT  DETECTED NOT DETECTED Final   Adenovirus F40/41 NOT DETECTED NOT DETECTED Final   Astrovirus NOT DETECTED NOT DETECTED Final   Norovirus GI/GII NOT DETECTED NOT DETECTED Final   Rotavirus A NOT DETECTED NOT DETECTED Final   Sapovirus (I, II, IV, and V) NOT DETECTED NOT DETECTED Final  C difficile quick scan w PCR reflex     Status: None   Collection Time: 03/17/16  1:20 AM  Result Value Ref Range Status   C Diff antigen NEGATIVE NEGATIVE Final   C Diff toxin NEGATIVE NEGATIVE Final   C Diff interpretation Negative for toxigenic C. difficile  Final    Comment: VALID    RADIOLOGY STUDIES/RESULTS: Ct Abdomen Pelvis W Contrast  03/17/2016  CLINICAL DATA:  Vomiting and diarrhea beginning yesterday, abdominal cramping, weakness and headache. History of diverticulitis, ulcerative colitis, hypertension and diabetes. EXAM: CT ABDOMEN AND PELVIS WITH CONTRAST TECHNIQUE: Multidetector CT imaging of the abdomen and pelvis was performed using the standard protocol following bolus administration of intravenous contrast. CONTRAST:  155mL ISOVUE-300 IOPAMIDOL (ISOVUE-300) INJECTION 61% COMPARISON:  CT abdomen and pelvis November 27, 2015 FINDINGS: LUNG BASES: Included view of the lung bases are clear. Visualized heart and pericardium are unremarkable. SOLID ORGANS: The liver is diffusely hypodense compatible with steatosis. Status postcholecystectomy. Spleen, pancreas and adrenal glands are unremarkable. GASTROINTESTINAL TRACT: The stomach, small and large bowel are normal in course and caliber without inflammatory changes. Moderate descending and sigmoid diverticulosis. Normal appendix. A few air-fluid levels in small and large bowel. Enteric contrast has not yet reached the distal small bowel. KIDNEYS/ URINARY TRACT: Kidneys are orthotopic, demonstrating symmetric enhancement. No nephrolithiasis, hydronephrosis or solid renal masses. The unopacified ureters are normal in course and caliber. Delayed imaging through  the kidneys demonstrates symmetric prompt contrast excretion within the proximal urinary collecting system. Urinary bladder is decompressed and unremarkable. PERITONEUM/RETROPERITONEUM: Aortoiliac vessels are normal in course and caliber. No lymphadenopathy by CT size criteria. Internal reproductive organs are unremarkable. No intraperitoneal free fluid nor free air. SOFT TISSUE/OSSEOUS STRUCTURES: Non-suspicious. Probable bone island RIGHT iliac wing. Lower lumbar moderate facet arthropathy. IMPRESSION: A few scattered bowel air-fluid levels can be seen with enterocolitis. Colonic diverticulosis without CT findings of acute diverticulitis. Electronically Signed   By: Elon Alas M.D.   On: 03/17/2016 01:52  LOS: 1 day   Oren Binet, MD  [facility] Pager:[phone]  If 7PM-7AM, please contact night-coverage [email or web address] Password [email or web address] 03/18/2016, 8:45 AM

## 2016-03-18 NOTE — Progress Notes (Signed)
Pt states that diarrhea started again this morning after breakfast.  It directly follows meals, is very watery, and followed by severe abd cramps, gas and some nausea.Marland Kitchen

## 2016-03-19 ENCOUNTER — Encounter (HOSPITAL_COMMUNITY): Payer: Self-pay | Admitting: *Deleted

## 2016-03-19 ENCOUNTER — Encounter (HOSPITAL_COMMUNITY)
Admission: EM | Disposition: A | Discharge status: Home / Self Care | Payer: Self-pay | Source: Home / Self Care | Attending: Internal Medicine

## 2016-03-19 DIAGNOSIS — R1033 Periumbilical pain: Secondary | ICD-10-CM

## 2016-03-19 DIAGNOSIS — R1013 Epigastric pain: Secondary | ICD-10-CM

## 2016-03-19 DIAGNOSIS — K317 Polyp of stomach and duodenum: Secondary | ICD-10-CM

## 2016-03-19 DIAGNOSIS — K228 Other specified diseases of esophagus: Secondary | ICD-10-CM

## 2016-03-19 DIAGNOSIS — R112 Nausea with vomiting, unspecified: Secondary | ICD-10-CM

## 2016-03-19 HISTORY — PX: ESOPHAGOGASTRODUODENOSCOPY: SHX5428

## 2016-03-19 LAB — CBC WITH DIFFERENTIAL/PLATELET
BASOS ABS: 0 10*3/uL (ref 0.0–0.1)
Basophils Relative: 0 %
EOS ABS: 0.3 10*3/uL (ref 0.0–0.7)
Eosinophils Relative: 2 %
HCT: 38 % (ref 36.0–46.0)
HEMOGLOBIN: 12.3 g/dL (ref 12.0–15.0)
LYMPHS ABS: 3.1 10*3/uL (ref 0.7–4.0)
LYMPHS PCT: 24 %
MCH: 27.2 pg (ref 26.0–34.0)
MCHC: 32.4 g/dL (ref 30.0–36.0)
MCV: 84.1 fL (ref 78.0–100.0)
Monocytes Absolute: 0.9 10*3/uL (ref 0.1–1.0)
Monocytes Relative: 7 %
NEUTROS PCT: 67 %
Neutro Abs: 8.8 10*3/uL — ABNORMAL HIGH (ref 1.7–7.7)
PLATELETS: 309 10*3/uL (ref 150–400)
RBC: 4.52 MIL/uL (ref 3.87–5.11)
RDW: 14.2 % (ref 11.5–15.5)
WBC: 13.1 10*3/uL — AB (ref 4.0–10.5)

## 2016-03-19 LAB — GLUCOSE, CAPILLARY
GLUCOSE-CAPILLARY: 98 mg/dL (ref 65–99)
GLUCOSE-CAPILLARY: 149 mg/dL — AB (ref 65–99)

## 2016-03-19 SURGERY — EGD (ESOPHAGOGASTRODUODENOSCOPY)
Anesthesia: Moderate Sedation

## 2016-03-19 MED ORDER — DICYCLOMINE HCL 10 MG PO CAPS
10.0000 mg | ORAL_CAPSULE | Freq: Three times a day (TID) | ORAL | Status: DC
  Administered 2016-03-19: 10 mg via ORAL
  Filled 2016-03-19: qty 1

## 2016-03-19 MED ORDER — ENSURE ENLIVE PO LIQD: 237.0000 mL | Freq: Two times a day (BID) | ORAL | Status: DC

## 2016-03-19 MED ORDER — PANTOPRAZOLE SODIUM 40 MG PO TBEC
40.0000 mg | DELAYED_RELEASE_TABLET | Freq: Every day | ORAL | Status: DC
Start: 2016-03-19 — End: 2016-03-19

## 2016-03-19 MED ORDER — STERILE WATER FOR IRRIGATION IR SOLN
Status: DC | PRN
  Administered 2016-03-19: 10:00:00

## 2016-03-19 MED ORDER — MIDAZOLAM HCL 5 MG/5ML IJ SOLN
INTRAMUSCULAR | Status: AC
  Filled 2016-03-19: qty 10

## 2016-03-19 MED ORDER — DICYCLOMINE HCL 10 MG PO CAPS: 10.0000 mg | ORAL_CAPSULE | Freq: Three times a day (TID) | ORAL | Status: DC

## 2016-03-19 MED ORDER — MEPERIDINE HCL 50 MG/ML IJ SOLN
INTRAMUSCULAR | Status: DC | PRN
  Administered 2016-03-19 (×2): 25 mg via INTRAVENOUS

## 2016-03-19 MED ORDER — SODIUM CHLORIDE 0.9 % IV SOLN
INTRAVENOUS | Status: DC
Start: 2016-03-19 — End: 2016-03-19
  Administered 2016-03-19: 1000 mL via INTRAVENOUS

## 2016-03-19 MED ORDER — MIDAZOLAM HCL 5 MG/5ML IJ SOLN
INTRAMUSCULAR | Status: AC
  Filled 2016-03-19: qty 5

## 2016-03-19 MED ORDER — MIDAZOLAM HCL 5 MG/5ML IJ SOLN
INTRAMUSCULAR | Status: DC | PRN
  Administered 2016-03-19: 3 mg via INTRAVENOUS
  Administered 2016-03-19 (×2): 2 mg via INTRAVENOUS
  Administered 2016-03-19: 3 mg via INTRAVENOUS
  Administered 2016-03-19: 2 mg via INTRAVENOUS

## 2016-03-19 MED ORDER — MEPERIDINE HCL 50 MG/ML IJ SOLN
INTRAMUSCULAR | Status: AC
  Filled 2016-03-19: qty 1

## 2016-03-19 NOTE — Progress Notes (Signed)
CM provided RX drug discount card for medications and also offer for assistance with Bentyl medication.  Patient stated she gets medications from [facility] and will continue to do so.  CM provided current list of medications that are offered for discount prices.  Medications on list patient currently takes.

## 2016-03-19 NOTE — Op Note (Signed)
[facility] Patient Name: Kristin Bailey Procedure Date: 03/19/2016 8:37 AM MRN: [id number] Date of Birth: 02-11-1961 Attending MD: Hildred Laser , MD CSN: [id number] Age: 55 Admit Type: Inpatient Procedure:                Upper GI endoscopy Indications:              Epigastric abdominal pain, Periumbilical abdominal                            pain, Nausea with vomiting Providers:                Hildred Laser, MD, Gwenlyn Fudge, RN, Isabella Stalling, Technician Referring MD:             Oren Binet, MD Medicines:                Cetacaine spray, Meperidine 50 mg IV, Midazolam 12                            mg IV Complications:            No immediate complications. Estimated Blood Loss:     Estimated blood loss was minimal. Procedure:                Pre-Anesthesia Assessment:                           - Prior to the procedure, a History and Physical                            was performed, and patient medications and                            allergies were reviewed. The patient's tolerance of                            previous anesthesia was also reviewed. The risks                            and benefits of the procedure and the sedation                            options and risks were discussed with the patient.                            All questions were answered, and informed consent                            was obtained. Prior Anticoagulants: The patient                            last took aspirin 3 days prior to the procedure.  ASA Grade Assessment: II - A patient with mild                            systemic disease. After reviewing the risks and                            benefits, the patient was deemed in satisfactory                            condition to undergo the procedure.                           After obtaining informed consent, the endoscope was                            passed under direct vision.  Throughout the                            procedure, the patient's blood pressure, pulse, and                            oxygen saturations were monitored continuously. The                            [id number] scope was introduced through the                            mouth, and advanced to the second part of duodenum.                            The upper GI endoscopy was accomplished without                            difficulty. The patient tolerated the procedure                            well. Scope In: 9:57:51 AM Scope Out: 10:07:52 AM Total Procedure Duration: 0 hours 10 minutes 1 second  Findings:      The examined esophagus was normal.      The Z-line was irregular and was found 42 cm from the incisors.      A few 3 to 6 mm semi-sessile polyps with no stigmata of recent bleeding       were found in the gastric fundus. Biopsies were taken with a cold       forceps for histology.      The duodenal bulb and second portion of the duodenum were normal.       Biopsies for histology were taken with a cold forceps for evaluation of       celiac disease.      The hypopharynx was normal. Impression:               - Normal esophagus.                           - Z-line irregular, 42 cm  from the incisors.                           - A few gastric polyps. Biopsied.                           - Normal duodenal bulb and second portion of the                            duodenum. Biopsied.                           - Normal hypopharynx. Moderate Sedation:      Moderate (conscious) sedation was administered by the endoscopy nurse       and supervised by the endoscopist. The following parameters were       monitored: oxygen saturation, heart rate, blood pressure, CO2       capnography and response to care. Total physician intraservice time was       19 minutes. Recommendation:           - Return patient to hospital ward for ongoing care.                           - Diabetic (ADA) diet  today.                           - Continue present medications.                           - Stop hyoscyamine and begin dicyclomine 10 mg by                            mouth before each meal.                           - I will contact patient with results of pending                            studies and arrange for office visit in 6-8 weeks.                           - Patient advised to keep symptom diary until                            office visit. Procedure Code(s):        --- Professional ---                           [phone], Esophagogastroduodenoscopy, flexible,                            transoral; with biopsy, single or multiple                           [id number], Moderate sedation services provided by the  same physician or other qualified health care                            professional performing the diagnostic or                            therapeutic service that the sedation supports,                            requiring the presence of an independent trained                            observer to assist in the monitoring of the                            patient's level of consciousness and physiological                            status; initial 15 minutes of intraservice time,                            patient age 54 years or older Diagnosis Code(s):        --- Professional ---                           K22.8, Other specified diseases of esophagus                           K31.7, Polyp of stomach and duodenum                           R10.13, Epigastric pain                           [id number], Periumbilical pain                           R11.2, Nausea with vomiting, unspecified CPT copyright 2016 [employer]. All rights reserved. The codes documented in this report are preliminary and upon coder review may  be revised to meet current compliance requirements. Hildred Laser, MD Hildred Laser, MD 03/19/2016 10:30:38 AM This report  has been signed electronically. Number of Addenda: 0

## 2016-03-19 NOTE — Progress Notes (Signed)
Patient's IV removed.  Site WNL.  AVS reviewed with patient.  Verbalized understanding of discharge instructions, physician follow-up, and medications.  Prescriptions and work note given to patient.  Patient reports belongings intact and in possession at time of discharge.  Patient transported by NT via w/c to main entrance for discharge.  Patient transported home via private vehicle by husband.  Patient stable at time of discharge.

## 2016-03-19 NOTE — Progress Notes (Signed)
Patient ate 100% of lunch.  Patient had an episode of loose and somewhat formed stool per patient.  Dr. Sloan Leiter notified via text page.  Dr. Sloan Leiter returned page - continue with discharge home today.

## 2016-03-19 NOTE — Discharge Summary (Signed)
PATIENT DETAILS Name: Kristin Bailey Age: 55 y.o. Sex: female Date of Birth: 01-09-1961 MRN: [id number]. Admitting Physician: Oswald Hillock, MD [id number], [name], MD  Admit Date: 03/16/2016 Discharge date: 03/19/2016  Recommendations for Outpatient Follow-up:  1. EGD biopsy results still pending please follow.  2. 24 hour urine aminolevulinic acid pending-follow 3. Tissue transglutaminase reticulin antibody IgA still pending-follow 4. Please repeat CBC/BMET at next visit  PRIMARY DISCHARGE DIAGNOSIS:  Active Problems:   Controlled type 2 diabetes mellitus without complication, without long-term current use of insulin (HCC)   Enterocolitis   Gastroenteritis, infectious      PAST MEDICAL HISTORY: Past Medical History  Diagnosis Date  . Depression   . IBS (irritable bowel syndrome)   . Hypertension   . Diabetes mellitus without complication ([place])   . Thyroid disease   . Pneumonia   . GERD (gastroesophageal reflux disease)     DISCHARGE MEDICATIONS: Current Discharge Medication List    START taking these medications   Details  dicyclomine (BENTYL) 10 MG capsule Take 1 capsule (10 mg total) by mouth 3 (three) times daily before meals. Qty: 90 capsule, Refills: 0    feeding supplement, ENSURE ENLIVE, (ENSURE ENLIVE) LIQD Take 237 mLs by mouth 2 (two) times daily between meals. Qty: 60 Bottle, Refills: 0      CONTINUE these medications which have NOT CHANGED   Details  acetaminophen (TYLENOL) 650 MG CR tablet Take 1,300 mg by mouth 2 (two) times daily as needed for pain.    albuterol (PROVENTIL HFA;VENTOLIN HFA) 108 (90 BASE) MCG/ACT inhaler Inhale 2 puffs into the lungs every 4 (four) hours as needed for wheezing or shortness of breath.    ALPRAZolam (XANAX) 0.25 MG tablet Take 0.25 mg by mouth 2 (two) times daily as needed for anxiety.    aspirin EC 81 MG tablet Take 81 mg by mouth daily.    Dulaglutide (TRULICITY) A999333 0000000 SOPN Inject 0.5 mLs into the skin  once a week. On  Monday's.    DULoxetine (CYMBALTA) 60 MG capsule Take 60 mg by mouth daily.    glipiZIDE (GLUCOTROL) 5 MG tablet Take 5 mg by mouth 2 (two) times daily.     levothyroxine (SYNTHROID, LEVOTHROID) 75 MCG tablet Take 75 mcg by mouth daily before breakfast.    lisinopril-hydrochlorothiazide (PRINZIDE,ZESTORETIC) 20-12.5 MG per tablet Take 1 tablet by mouth daily.    loratadine (CLARITIN) 10 MG tablet Take 10 mg by mouth daily.    Multiple Vitamin (MULTIVITAMIN WITH MINERALS) TABS tablet Take 1 tablet by mouth daily.    omeprazole (PRILOSEC OTC) 20 MG tablet Take 1 tablet (20 mg total) by mouth daily. Qty: 20 tablet, Refills: 0      STOP taking these medications     hyoscyamine (LEVSIN, ANASPAZ) 0.125 MG tablet         ALLERGIES:  No Known Allergies  BRIEF HPI:  See H&P, Labs, Consult and Test reports for all details in brief, Patient is a 55 y.o. female with past medical history of irritable bowel syndrome, diabetes, gastroesophageal reflux disease admitted with diffuse abdominal pain along with vomiting and diarrhea for the past 2 days. No blood or mucus in stools. CT scan of the abdomen on admission was consistent with enterocolitis.   CONSULTATIONS:   GI  PERTINENT RADIOLOGIC STUDIES: Ct Abdomen Pelvis W Contrast  03/17/2016  CLINICAL DATA:  Vomiting and diarrhea beginning yesterday, abdominal cramping, weakness and headache. History of diverticulitis, ulcerative colitis, hypertension and diabetes. EXAM:  CT ABDOMEN AND PELVIS WITH CONTRAST TECHNIQUE: Multidetector CT imaging of the abdomen and pelvis was performed using the standard protocol following bolus administration of intravenous contrast. CONTRAST:  169mL ISOVUE-300 IOPAMIDOL (ISOVUE-300) INJECTION 61% COMPARISON:  CT abdomen and pelvis November 27, 2015 FINDINGS: LUNG BASES: Included view of the lung bases are clear. Visualized heart and pericardium are unremarkable. SOLID ORGANS: The liver is diffusely  hypodense compatible with steatosis. Status postcholecystectomy. Spleen, pancreas and adrenal glands are unremarkable. GASTROINTESTINAL TRACT: The stomach, small and large bowel are normal in course and caliber without inflammatory changes. Moderate descending and sigmoid diverticulosis. Normal appendix. A few air-fluid levels in small and large bowel. Enteric contrast has not yet reached the distal small bowel. KIDNEYS/ URINARY TRACT: Kidneys are orthotopic, demonstrating symmetric enhancement. No nephrolithiasis, hydronephrosis or solid renal masses. The unopacified ureters are normal in course and caliber. Delayed imaging through the kidneys demonstrates symmetric prompt contrast excretion within the proximal urinary collecting system. Urinary bladder is decompressed and unremarkable. PERITONEUM/RETROPERITONEUM: Aortoiliac vessels are normal in course and caliber. No lymphadenopathy by CT size criteria. Internal reproductive organs are unremarkable. No intraperitoneal free fluid nor free air. SOFT TISSUE/OSSEOUS STRUCTURES: Non-suspicious. Probable bone island RIGHT iliac wing. Lower lumbar moderate facet arthropathy. IMPRESSION: A few scattered bowel air-fluid levels can be seen with enterocolitis. Colonic diverticulosis without CT findings of acute diverticulitis. Electronically Signed   By: Elon Alas M.D.   On: 03/17/2016 01:52     PERTINENT LAB RESULTS: CBC:  Recent Labs  03/18/16 0528 03/19/16 0519  WBC 11.1* 13.1*  HGB 12.0 12.3  HCT 36.9 38.0  PLT 301 309   CMET CMP     Component Value Date/Time   NA 138 03/18/2016 0528   K 3.5 03/18/2016 0528   CL 107 03/18/2016 0528   CO2 25 03/18/2016 0528   GLUCOSE 97 03/18/2016 0528   BUN 6 03/18/2016 0528   CREATININE 0.57 03/18/2016 0528   CALCIUM 8.8* 03/18/2016 0528   PROT 8.6* 03/16/2016 2330   ALBUMIN 4.2 03/16/2016 2330   AST 23 03/16/2016 2330   ALT 31 03/16/2016 2330   ALKPHOS 77 03/16/2016 2330   BILITOT 0.5  03/16/2016 2330   GFRNONAA >60 03/18/2016 0528   GFRAA >60 03/18/2016 0528    GFR Estimated Creatinine Clearance: 106.2 mL/min (by C-G formula based on Cr of 0.57). No results for input(s): LIPASE, AMYLASE in the last 72 hours. No results for input(s): CKTOTAL, CKMB, CKMBINDEX, TROPONINI in the last 72 hours. Invalid input(s): POCBNP No results for input(s): DDIMER in the last 72 hours.  Recent Labs  03/16/16 2330  HGBA1C 6.5*   No results for input(s): CHOL, HDL, LDLCALC, TRIG, CHOLHDL, LDLDIRECT in the last 72 hours. No results for input(s): TSH, T4TOTAL, T3FREE, THYROIDAB in the last 72 hours.  Invalid input(s): FREET3 No results for input(s): VITAMINB12, FOLATE, FERRITIN, TIBC, IRON, RETICCTPCT in the last 72 hours. Coags: No results for input(s): INR in the last 72 hours.  Invalid input(s): PT Microbiology: Recent Results (from the past 240 hour(s))  Gastrointestinal Panel by PCR , Stool     Status: None   Collection Time: 03/17/16  1:20 AM  Result Value Ref Range Status   Campylobacter species NOT DETECTED NOT DETECTED Final   Plesimonas shigelloides NOT DETECTED NOT DETECTED Final   Salmonella species NOT DETECTED NOT DETECTED Final   Yersinia enterocolitica NOT DETECTED NOT DETECTED Final   Vibrio species NOT DETECTED NOT DETECTED Final   Vibrio cholerae NOT DETECTED  NOT DETECTED Final   Enteroaggregative E coli (EAEC) NOT DETECTED NOT DETECTED Final   Enteropathogenic E coli (EPEC) NOT DETECTED NOT DETECTED Final   Enterotoxigenic E coli (ETEC) NOT DETECTED NOT DETECTED Final   Shiga like toxin producing E coli (STEC) NOT DETECTED NOT DETECTED Final   E. coli O157 NOT DETECTED NOT DETECTED Final   Shigella/Enteroinvasive E coli (EIEC) NOT DETECTED NOT DETECTED Final   Cryptosporidium NOT DETECTED NOT DETECTED Final   Cyclospora cayetanensis NOT DETECTED NOT DETECTED Final   Entamoeba histolytica NOT DETECTED NOT DETECTED Final   Giardia lamblia NOT DETECTED NOT  DETECTED Final   Adenovirus F40/41 NOT DETECTED NOT DETECTED Final   Astrovirus NOT DETECTED NOT DETECTED Final   Norovirus GI/GII NOT DETECTED NOT DETECTED Final   Rotavirus A NOT DETECTED NOT DETECTED Final   Sapovirus (I, II, IV, and V) NOT DETECTED NOT DETECTED Final  C difficile quick scan w PCR reflex     Status: None   Collection Time: 03/17/16  1:20 AM  Result Value Ref Range Status   C Diff antigen NEGATIVE NEGATIVE Final   C Diff toxin NEGATIVE NEGATIVE Final   C Diff interpretation Negative for toxigenic C. difficile  Final    Comment: VALID     BRIEF HOSPITAL COURSE:  Recurrent colitis/enteritis:? Etiology. Much improved with supportive care and empiric Cipro and Flagyl. Stool C. difficile PCR and GI pathogen panel negative. Leukocytosis has essentially resolved. She has had numerous episodes of nausea, vomiting, diarrhea and abdominal pain over the past few years. GI has been consulted, workup for acute intermittent porphyria and celiac disease as been initiated. 24 urine collection for amniolevulinic acid is currently pending, tissue transglutaminase serology also pending. Underwent EGD on 5/6-biopsy results are pending. Clinically improved with no further nausea, vomiting or diarrhea. No further abdominal pain. All antibiotics have been discontinued-as it is not felt this is due to an infectious etiology. Spoke with GI M.D.-Dr. Corbin Ade today, okay to discharge, he will arrange for outpatient follow-up at this office and will follow-up pending serologically and biopsy results.  Type 2 diabetes: CBGs stable with SSI,  resume usual oral hypoglycemic regimen on discharge  Hypothyroidism: Continue Synthroid  Hypertension: BP relatively well-controlled-but now creeping up-to resume HCTZ and lisinopril.    GERD: Continue PPI  Anxiety/depression: Appears stable- Continue as needed Xanax and Cymbalta  History of irritable bowel syndrome: Previously on Levsin-Dr. Rahman-now  recommends starting Bentyl.   TODAY-DAY OF DISCHARGE:  Subjective:   Kristin Bailey today has no headache,no chest abdominal pain,no new weakness tingling or numbness, feels much better wants to go home today.   Objective:   Blood pressure 133/70, pulse 77, temperature 98.3 F (36.8 C), temperature source Oral, resp. rate 27, height 5\' 6"  (1.676 m), weight 120.203 kg (265 lb), SpO2 98 %.  Intake/Output Summary (Last 24 hours) at 03/19/16 1039 Last data filed at 03/19/16 1020  Gross per 24 hour  Intake    700 ml  Output      0 ml  Net    700 ml   Filed Weights   03/16/16 2133 03/19/16 0839  Weight: 120.203 kg (265 lb) 120.203 kg (265 lb)    Exam Awake Alert, Oriented *3, No new F.N deficits, Normal affect [place].AT,PERRAL Supple Neck,No JVD, No cervical lymphadenopathy appriciated.  Symmetrical Chest wall movement, Good air movement bilaterally, CTAB RRR,No Gallops,Rubs or new Murmurs, No Parasternal Heave +ve B.Sounds, Abd Soft, Non tender, No organomegaly appriciated, No rebound -guarding  or rigidity. No Cyanosis, Clubbing or edema, No new Rash or bruise  DISCHARGE CONDITION: Stable  DISPOSITION: Home  DISCHARGE INSTRUCTIONS:    Activity:  As tolerated   Get Medicines reviewed and adjusted: Please take all your medications with you for your next visit with your Primary MD  Please request your Primary MD to go over all hospital tests and procedure/radiological results at the follow up, please ask your Primary MD to get all Hospital records sent to his/her office.  If you experience worsening of your admission symptoms, develop shortness of breath, life threatening emergency, suicidal or homicidal thoughts you must seek medical attention immediately by calling 911 or calling your MD immediately  if symptoms less severe.  You must read complete instructions/literature along with all the possible adverse reactions/side effects for all the Medicines you take and that have  been prescribed to you. Take any new Medicines after you have completely understood and accpet all the possible adverse reactions/side effects.   Do not drive when taking Pain medications.   Do not take more than prescribed Pain, Sleep and Anxiety Medications  Special Instructions: If you have smoked or chewed Tobacco  in the last 2 yrs please stop smoking, stop any regular Alcohol  and or any Recreational drug use.  Wear Seat belts while driving.  Please note  You were cared for by a hospitalist during your hospital stay. Once you are discharged, your primary care physician will handle any further medical issues. Please note that NO REFILLS for any discharge medications will be authorized once you are discharged, as it is imperative that you return to your primary care physician (or establish a relationship with a primary care physician if you do not have one) for your aftercare needs so that they can reassess your need for medications and monitor your lab values.   Diet recommendation: Diabetic Diet Heart Healthy diet  Discharge Instructions    Call MD for:  persistant nausea and vomiting    Complete by:  As directed      Call MD for:  severe uncontrolled pain    Complete by:  As directed      Diet - low sodium heart healthy    Complete by:  As directed      Diet Carb Modified    Complete by:  As directed      Increase activity slowly    Complete by:  As directed            Follow-up Information    Follow up with Rory Percy, MD. Schedule an appointment as soon as possible for a visit in 1 week.   Specialty:  Family Medicine   Why:  Hospital follow up   Contact information:   [place] [address] [phone]       Follow up with Rogene Houston, MD.   Specialty:  Gastroenterology   Why:  Office will call with date/time, If you do not hear from them in 1 week,please giv   Contact information:   [address], [address] [place] [address] [phone]        Total Time spent on discharge equals  45 minutes.  SignedOren Binet 03/19/2016 10:39 AM

## 2016-03-20 LAB — TISSUE TRANSGLUTAMINASE, IGA: Tissue Transglutaminase Ab, IgA: 2 U/mL (ref 0–3)

## 2016-03-21 LAB — GLIADIN ANTIBODIES, SERUM
Gliadin IgA: 4 units (ref 0–19)
Gliadin IgG: 2 units (ref 0–19)

## 2016-03-22 ENCOUNTER — Encounter (HOSPITAL_COMMUNITY): Payer: Self-pay | Admitting: Internal Medicine

## 2016-03-23 LAB — RETICULIN ANTIBODIES, IGA W TITER: RETICULIN AB, IGA: NEGATIVE {titer}

## 2016-03-29 ENCOUNTER — Telehealth (INDEPENDENT_AMBULATORY_CARE_PROVIDER_SITE_OTHER): Payer: Self-pay | Admitting: Internal Medicine

## 2016-03-29 NOTE — Telephone Encounter (Signed)
Per Dr.Rehman - wait until the results are in ,before an appointment is made.

## 2016-03-29 NOTE — Telephone Encounter (Signed)
Patient called, stated that she was recently in the hospital.  The hospital told her to call and make a f/u appointment.  She stated that Dr. Laural Bailey called her this morning and left her a message regarding some test results, however, some results aren't in yet.  Her question is, should she go ahead and make an appointment now or wait until the rest of the results are in.  [phone]

## 2016-03-30 NOTE — Telephone Encounter (Signed)
Called patient and advised her to wait until all her results were in before scheduling an appointment

## 2016-03-31 LAB — AMINOLEVULINIC ACID, 24 HOUR
Creatinine, Urine mg/day-ALA: 0.77 g/(24.h) — ABNORMAL LOW (ref 0.800–2.000)
Delta Ala,  24H Ur: 0.05 mg/dL (ref 0.03–0.54)
Total Volume - ALA: 2200 mL

## 2016-04-06 ENCOUNTER — Encounter (INDEPENDENT_AMBULATORY_CARE_PROVIDER_SITE_OTHER): Payer: Self-pay | Admitting: Internal Medicine

## 2016-04-06 NOTE — Progress Notes (Signed)
Patient was given an appointment for 05/03/16 at 9:00am with Deberah Castle, NP

## 2016-04-23 ENCOUNTER — Telehealth (INDEPENDENT_AMBULATORY_CARE_PROVIDER_SITE_OTHER): Payer: Self-pay | Admitting: Internal Medicine

## 2016-04-23 MED ORDER — DICYCLOMINE HCL 10 MG PO CAPS: 10.0000 mg | ORAL_CAPSULE | Freq: Three times a day (TID) | ORAL | Status: DC

## 2016-04-23 NOTE — Telephone Encounter (Signed)
Patient states dicyclomine is helping and she needs a new prescription. Prescription sent to her pharmacy for dicyclomine 10 mg before each meal 90 with 5 refills.

## 2016-05-03 ENCOUNTER — Encounter (INDEPENDENT_AMBULATORY_CARE_PROVIDER_SITE_OTHER): Payer: Self-pay | Admitting: Internal Medicine

## 2016-05-03 ENCOUNTER — Encounter (INDEPENDENT_AMBULATORY_CARE_PROVIDER_SITE_OTHER): Payer: Self-pay | Admitting: *Deleted

## 2016-05-03 ENCOUNTER — Ambulatory Visit (INDEPENDENT_AMBULATORY_CARE_PROVIDER_SITE_OTHER): Payer: Self-pay | Admitting: Internal Medicine

## 2016-05-03 VITALS — BP 148/90 | HR 72 | Temp 98.0°F | Ht 66.0 in | Wt 257.4 lb

## 2016-05-03 DIAGNOSIS — R112 Nausea with vomiting, unspecified: Secondary | ICD-10-CM

## 2016-05-03 MED ORDER — PROMETHAZINE HCL 12.5 MG PO TABS: 12.5000 mg | ORAL_TABLET | Freq: Four times a day (QID) | ORAL | Status: DC | PRN

## 2016-05-03 NOTE — Patient Instructions (Addendum)
OV in 6 months.  NM Emptying study

## 2016-05-03 NOTE — Progress Notes (Addendum)
Subjective:    Patient ID: Kristin Bailey, female    DOB: Jul 20, 1961, 55 y.o.   MRN: [id number]  HPI  03/17/2016  Here today for f/u. Admitted in May with N,V, and diarrhea.  CT revealed possible enterocolitis.  She was covered with Cipro and Flagyl. She felt 40-50% better. Today she says she has some nausea.  She has had one episode of vomiting. No diarrhea. Her  Stool was formed today.Actually she thought her stool was hard.  She has stomach cramps.  She has been only taking the Dicyclomine BID. She has foul smelling burps. For the most part, her appetite is good.    Underwent a CT while in the ED which revealed possible enterocolitis. She feels 40 to 50%. Admitted to [facility] in January for same: 11/27/2015: CT scan abdomen/pelvis with CM: Mild wall thickening of the transverse and descending colon is noted suggesting possible infectious or inflammatory colitis.    CBC    Component Value Date/Time   WBC 13.1* 03/19/2016 0519   RBC 4.52 03/19/2016 0519   HGB 12.3 03/19/2016 0519   HCT 38.0 03/19/2016 0519   PLT 309 03/19/2016 0519   MCV 84.1 03/19/2016 0519   MCH 27.2 03/19/2016 0519   MCHC 32.4 03/19/2016 0519   RDW 14.2 03/19/2016 0519   LYMPHSABS 3.1 03/19/2016 0519   MONOABS 0.9 03/19/2016 0519   EOSABS 0.3 03/19/2016 0519   BASOSABS 0.0 03/19/2016 0519   CMP     Component Value Date/Time   NA 138 03/18/2016 0528   K 3.5 03/18/2016 0528   CL 107 03/18/2016 0528   CO2 25 03/18/2016 0528   GLUCOSE 97 03/18/2016 0528   BUN 6 03/18/2016 0528   CREATININE 0.57 03/18/2016 0528   CALCIUM 8.8* 03/18/2016 0528   PROT 8.6* 03/16/2016 2330   ALBUMIN 4.2 03/16/2016 2330   AST 23 03/16/2016 2330   ALT 31 03/16/2016 2330   ALKPHOS 77 03/16/2016 2330   BILITOT 0.5 03/16/2016 2330   GFRNONAA >60 03/18/2016 0528   GFRAA >60 03/18/2016 0528        08/28/2014 Colonoscopy  Indications: Patient is a 55 year old Caucasian female who presents with 2 episodes of  hematochezia. She gives history of colonic polyps. Family history significant for colon carcinoma in her mother but she was in her early 86s at the time of diagnosis.  Impression:  Examination performed to cecum. Moderate sigmoid colon diverticulosis and external hemorrhoids.    03/19/2016 EGD:  celiac disease.  The hypopharynx was normal. Impression: - Normal esophagus.  - Z-line irregular, 42 cm from the incisors.  - A few gastric polyps. Biopsied.  - Normal duodenal bulb and second portion of the   duodenum. Biopsied.  - Normal hypopharynx. Gastric and duodenal biopsies and celiac panel result which is negative. Urinary porphyrins pending. Report to PCP   Review of Systems Past Medical History  Diagnosis Date  . Depression   . IBS (irritable bowel syndrome)   . Hypertension   . Diabetes mellitus without complication ([place])   . Thyroid disease   . Pneumonia   . GERD (gastroesophageal reflux disease)     Past Surgical History  Procedure Laterality Date  . Cesarean section    . Tubal ligation    . Cholecystectomy    . Fracture surgery      rt ankle   . Carpal tunnel release    . Cyst removal hand    . Knee surgery  meniscus repair  . Colonoscopy N/A 08/28/2014    Procedure: COLONOSCOPY;  Surgeon: Rogene Houston, MD;  Location: [facility];  Service: Endoscopy;  Laterality: N/A;  1030  . Esophagogastroduodenoscopy N/A 03/19/2016    Procedure: ESOPHAGOGASTRODUODENOSCOPY (EGD);  Surgeon: Rogene Houston, MD;  Location: [facility];  Service: Endoscopy;  Laterality: N/A;    No Known Allergies  Current Outpatient Prescriptions on File Prior to Visit  Medication Sig Dispense Refill  . acetaminophen (TYLENOL) 650 MG CR tablet Take 1,300 mg by mouth 2 (two) times daily as needed for pain.    Marland Kitchen albuterol (PROVENTIL  HFA;VENTOLIN HFA) 108 (90 BASE) MCG/ACT inhaler Inhale 2 puffs into the lungs every 4 (four) hours as needed for wheezing or shortness of breath.    . ALPRAZolam (XANAX) 0.25 MG tablet Take 0.25 mg by mouth 2 (two) times daily as needed for anxiety.    Marland Kitchen aspirin EC 81 MG tablet Take 81 mg by mouth daily.    Marland Kitchen dicyclomine (BENTYL) 10 MG capsule Take 1 capsule (10 mg total) by mouth 3 (three) times daily before meals. 90 capsule 5  . Dulaglutide (TRULICITY) A999333 0000000 SOPN Inject 0.5 mLs into the skin once a week. On  Monday's.    . DULoxetine (CYMBALTA) 60 MG capsule Take 60 mg by mouth daily.    . feeding supplement, ENSURE ENLIVE, (ENSURE ENLIVE) LIQD Take 237 mLs by mouth 2 (two) times daily between meals. 60 Bottle 0  . glipiZIDE (GLUCOTROL) 5 MG tablet Take 5 mg by mouth 2 (two) times daily.     Marland Kitchen levothyroxine (SYNTHROID, LEVOTHROID) 75 MCG tablet Take 75 mcg by mouth daily before breakfast.    . lisinopril-hydrochlorothiazide (PRINZIDE,ZESTORETIC) 20-12.5 MG per tablet Take 1 tablet by mouth daily.    Marland Kitchen loratadine (CLARITIN) 10 MG tablet Take 10 mg by mouth daily.    . Multiple Vitamin (MULTIVITAMIN WITH MINERALS) TABS tablet Take 1 tablet by mouth daily.    Marland Kitchen omeprazole (PRILOSEC OTC) 20 MG tablet Take 1 tablet (20 mg total) by mouth daily. 20 tablet 0  . [DISCONTINUED] promethazine (PHENERGAN) 25 MG tablet Take 1 tablet (25 mg total) by mouth every 6 (six) hours as needed for nausea or vomiting. (Patient not taking: Reported on 08/30/2015) 8 tablet 0   No current facility-administered medications on file prior to visit.        Objective:   Physical Exam Blood pressure 148/90, pulse 72, temperature 98 F (36.7 C), height 5\' 6"  (1.676 m), weight 257 lb 6.4 oz (116.756 kg).  Alert and oriented. Skin warm and dry. Oral mucosa is moist.   . Sclera anicteric, conjunctivae is pink. Thyroid not enlarged. No cervical lymphadenopathy. Lungs clear. Heart regular rate and rhythm.  Abdomen is  soft. Bowel sounds are positive. No hepatomegaly. No abdominal masses felt. No tenderness.  No edema to lower extremities.         Assessment & Plan:  Nausea. Am going to get a NM Empting study on here. I was going to call in Zofran but this medication is to expensive. Will call in Phenergan 12.5mg  po. CBC and Hepatic function

## 2016-05-05 ENCOUNTER — Ambulatory Visit (INDEPENDENT_AMBULATORY_CARE_PROVIDER_SITE_OTHER): Payer: Self-pay | Admitting: Internal Medicine

## 2016-05-13 ENCOUNTER — Encounter (HOSPITAL_COMMUNITY): Payer: Self-pay

## 2016-05-13 ENCOUNTER — Ambulatory Visit (HOSPITAL_COMMUNITY)
Admission: RE | Admit: 2016-05-13 | Discharge: 2016-05-13 | Disposition: A | Discharge status: Home / Self Care | Payer: Self-pay | Source: Ambulatory Visit | Attending: Internal Medicine | Admitting: Internal Medicine

## 2016-05-13 DIAGNOSIS — R112 Nausea with vomiting, unspecified: Secondary | ICD-10-CM | POA: Insufficient documentation

## 2016-05-13 LAB — CBC WITH DIFFERENTIAL/PLATELET
BASOS ABS: 0 {cells}/uL (ref 0–200)
Basophils Relative: 0 %
EOS PCT: 1 %
Eosinophils Absolute: 155 cells/uL (ref 15–500)
HCT: 41 % (ref 35.0–45.0)
HEMOGLOBIN: 13.7 g/dL (ref 11.7–15.5)
LYMPHS ABS: 4185 {cells}/uL — AB (ref 850–3900)
Lymphocytes Relative: 27 %
MCH: 27.3 pg (ref 27.0–33.0)
MCHC: 33.4 g/dL (ref 32.0–36.0)
MCV: 81.8 fL (ref 80.0–100.0)
MONO ABS: 775 {cells}/uL (ref 200–950)
MPV: 10.2 fL (ref 7.5–12.5)
Monocytes Relative: 5 %
Neutro Abs: 10385 cells/uL — ABNORMAL HIGH (ref 1500–7800)
Neutrophils Relative %: 67 %
Platelets: 362 10*3/uL (ref 140–400)
RBC: 5.01 MIL/uL (ref 3.80–5.10)
RDW: 14.7 % (ref 11.0–15.0)
WBC: 15.5 10*3/uL — ABNORMAL HIGH (ref 3.8–10.8)

## 2016-05-13 MED ORDER — TECHNETIUM TC 99M SULFUR COLLOID
2.0000 | Freq: Once | INTRAVENOUS | Status: AC | PRN
Start: 2016-05-13 — End: 2016-05-13
  Administered 2016-05-13: 2 via ORAL

## 2016-05-14 LAB — HEPATIC FUNCTION PANEL
ALK PHOS: 88 U/L (ref 33–130)
ALT: 16 U/L (ref 6–29)
AST: 13 U/L (ref 10–35)
Albumin: 4.2 g/dL (ref 3.6–5.1)
BILIRUBIN DIRECT: 0.1 mg/dL (ref <=0.2)
BILIRUBIN TOTAL: 0.3 mg/dL (ref 0.2–1.2)
Indirect Bilirubin: 0.2 mg/dL (ref 0.2–1.2)
Total Protein: 7.5 g/dL (ref 6.1–8.1)

## 2016-06-13 ENCOUNTER — Emergency Department (HOSPITAL_COMMUNITY)
Admission: EM | Admit: 2016-06-13 | Discharge: 2016-06-13 | Disposition: A | Discharge status: Home / Self Care | Payer: Self-pay | Attending: Emergency Medicine | Admitting: Emergency Medicine

## 2016-06-13 ENCOUNTER — Encounter (HOSPITAL_COMMUNITY): Payer: Self-pay | Admitting: *Deleted

## 2016-06-13 DIAGNOSIS — Z79899 Other long term (current) drug therapy: Secondary | ICD-10-CM | POA: Insufficient documentation

## 2016-06-13 DIAGNOSIS — M7551 Bursitis of right shoulder: Secondary | ICD-10-CM | POA: Insufficient documentation

## 2016-06-13 DIAGNOSIS — I10 Essential (primary) hypertension: Secondary | ICD-10-CM | POA: Insufficient documentation

## 2016-06-13 DIAGNOSIS — E119 Type 2 diabetes mellitus without complications: Secondary | ICD-10-CM | POA: Insufficient documentation

## 2016-06-13 DIAGNOSIS — M7552 Bursitis of left shoulder: Secondary | ICD-10-CM

## 2016-06-13 DIAGNOSIS — M25512 Pain in left shoulder: Secondary | ICD-10-CM

## 2016-06-13 DIAGNOSIS — Z7982 Long term (current) use of aspirin: Secondary | ICD-10-CM | POA: Insufficient documentation

## 2016-06-13 DIAGNOSIS — Z7984 Long term (current) use of oral hypoglycemic drugs: Secondary | ICD-10-CM | POA: Insufficient documentation

## 2016-06-13 LAB — CBC WITH DIFFERENTIAL/PLATELET
BASOS PCT: 0 %
Basophils Absolute: 0 10*3/uL (ref 0.0–0.1)
EOS PCT: 1 %
Eosinophils Absolute: 0.2 10*3/uL (ref 0.0–0.7)
HEMATOCRIT: 40.6 % (ref 36.0–46.0)
HEMOGLOBIN: 13.4 g/dL (ref 12.0–15.0)
LYMPHS PCT: 22 %
Lymphs Abs: 3.6 10*3/uL (ref 0.7–4.0)
MCH: 27.9 pg (ref 26.0–34.0)
MCHC: 33 g/dL (ref 30.0–36.0)
MCV: 84.6 fL (ref 78.0–100.0)
Monocytes Absolute: 1.1 10*3/uL — ABNORMAL HIGH (ref 0.1–1.0)
Monocytes Relative: 7 %
NEUTROS PCT: 70 %
Neutro Abs: 11.4 10*3/uL — ABNORMAL HIGH (ref 1.7–7.7)
Platelets: 321 10*3/uL (ref 150–400)
RBC: 4.8 MIL/uL (ref 3.87–5.11)
RDW: 14.2 % (ref 11.5–15.5)
WBC: 16.3 10*3/uL — ABNORMAL HIGH (ref 4.0–10.5)

## 2016-06-13 LAB — BASIC METABOLIC PANEL
ANION GAP: 8 (ref 5–15)
BUN: 20 mg/dL (ref 6–20)
CHLORIDE: 106 mmol/L (ref 101–111)
CO2: 24 mmol/L (ref 22–32)
Calcium: 9.1 mg/dL (ref 8.9–10.3)
Creatinine, Ser: 0.49 mg/dL (ref 0.44–1.00)
GFR calc non Af Amer: >60 mL/min (ref >60)
Glucose, Bld: 135 mg/dL — ABNORMAL HIGH (ref 65–99)
Potassium: 3.8 mmol/L (ref 3.5–5.1)
Sodium: 138 mmol/L (ref 135–145)

## 2016-06-13 LAB — TROPONIN I: Troponin I: <0.03 ng/mL (ref <0.03)

## 2016-06-13 MED ORDER — DIAZEPAM 5 MG/ML IJ SOLN
5.0000 mg | Freq: Once | INTRAMUSCULAR | Status: AC
  Administered 2016-06-13: 5 mg via INTRAMUSCULAR
  Filled 2016-06-13: qty 2

## 2016-06-13 MED ORDER — KETOROLAC TROMETHAMINE 60 MG/2ML IM SOLN
60.0000 mg | Freq: Once | INTRAMUSCULAR | Status: AC
  Administered 2016-06-13: 60 mg via INTRAMUSCULAR
  Filled 2016-06-13: qty 2

## 2016-06-13 MED ORDER — CYCLOBENZAPRINE HCL 10 MG PO TABS: 10.0000 mg | ORAL_TABLET | Freq: Three times a day (TID) | ORAL | 0 refills | Status: DC | PRN

## 2016-06-13 MED ORDER — TRAMADOL HCL 50 MG PO TABS: 100.0000 mg | ORAL_TABLET | Freq: Four times a day (QID) | ORAL | 0 refills | Status: DC | PRN

## 2016-06-13 MED ORDER — NAPROXEN 500 MG PO TABS: 500.0000 mg | ORAL_TABLET | Freq: Two times a day (BID) | ORAL | 0 refills | Status: DC

## 2016-06-13 MED ORDER — LORAZEPAM 2 MG/ML IJ SOLN
1.0000 mg | Freq: Once | INTRAMUSCULAR | Status: AC
  Administered 2016-06-13: 1 mg via INTRAMUSCULAR
  Filled 2016-06-13: qty 1

## 2016-06-13 MED ORDER — FENTANYL CITRATE (PF) 100 MCG/2ML IJ SOLN
50.0000 ug | Freq: Once | INTRAMUSCULAR | Status: AC
  Administered 2016-06-13: 50 ug via NASAL
  Filled 2016-06-13: qty 2

## 2016-06-13 NOTE — Discharge Instructions (Signed)
Use ice and heat over the area for comfort. Take the medications as prescribed with acetaminophen 1000 mg 4 times a day. Return to the emergency department if you get fever, increased redness of the skin over your shoulder area or the pain seems worse. Otherwise she should follow-up with an orthopedist. I gave you the number for [facility] and the local orthopedist.

## 2016-06-13 NOTE — ED Triage Notes (Signed)
Pt c/o left shoulder pain that radiates to left side of neck, left chest area and down left arm that started yesterday, pain is worse with movement, pt states that on the way to the er she became nauseous,

## 2016-06-13 NOTE — ED Provider Notes (Signed)
[facility] DEPT Provider Note   CSN: [id number] Arrival date & time: 06/13/16  0415  First Provider Contact:  04:33 AM      History   Chief Complaint Chief Complaint  Patient presents with  . Shoulder Pain    HPI Kristin Bailey is a 55 y.o. female.  HPI patient reports she started getting pain in her left shoulder the evening of the 29th. She states the pain starts in her left shoulder and then shoots up into her left neck, down her left arm into her hand, and into her left chest. She describes the pain several times as "excruciating". 8 any movement of her left arm and some movement of her head makes the pain worse, she states she did take some Tylenol and use ice on her shoulder with mild relief yesterday. She denies any change of her activity or any injury. She denies having this pain before. She got nauseated tonight without vomiting. She denies diaphoresis, shortness of breath, or neck pain. She states her whole left arm gets numb at times. She states basically the pain has been there constantly since it started. She describes it as throbbing, sharp, shooting, and constant. She states she had bursitis in her right shoulder several years ago but does not think it was as bad as this pain is now.   PCP Dr Herby Abraham Dr Maureen Ralphs  Past Medical History:  Diagnosis Date  . Depression   . Diabetes mellitus without complication ([place])   . GERD (gastroesophageal reflux disease)   . Hypertension   . IBS (irritable bowel syndrome)   . Pneumonia   . Thyroid disease     Patient Active Problem List   Diagnosis Date Noted  . Enterocolitis 03/17/2016  . Gastroenteritis, infectious 03/17/2016  . Leukocytosis 11/28/2015  . Acute respiratory failure with hypoxia ([place]) 11/28/2015  . Controlled type 2 diabetes mellitus without complication, without long-term current use of insulin ([place]) 11/28/2015  . Septic shock ([place]) 11/27/2015  . UTI (lower urinary tract infection) 11/27/2015  .  Colitis 11/27/2015  . AKI (acute kidney injury) ([place]) 11/27/2015  . Depression     Past Surgical History:  Procedure Laterality Date  . CARPAL TUNNEL RELEASE    . CESAREAN SECTION    . CHOLECYSTECTOMY    . COLONOSCOPY N/A 08/28/2014   Procedure: COLONOSCOPY;  Surgeon: Rogene Houston, MD;  Location: [facility];  Service: Endoscopy;  Laterality: N/A;  1030  . CYST REMOVAL HAND    . ESOPHAGOGASTRODUODENOSCOPY N/A 03/19/2016   Procedure: ESOPHAGOGASTRODUODENOSCOPY (EGD);  Surgeon: Rogene Houston, MD;  Location: [facility];  Service: Endoscopy;  Laterality: N/A;  . FRACTURE SURGERY     rt ankle   . KNEE SURGERY     meniscus repair  . TUBAL LIGATION      OB History    Gravida Para Term Preterm AB Living   1 1 1     1    SAB TAB Ectopic Multiple Live Births                   Home Medications    Prior to Admission medications   Medication Sig Start Date End Date Taking? Authorizing Provider  acetaminophen (TYLENOL) 650 MG CR tablet Take 1,300 mg by mouth 2 (two) times daily as needed for pain.    Historical Provider, MD  albuterol (PROVENTIL HFA;VENTOLIN HFA) 108 (90 BASE) MCG/ACT inhaler Inhale 2 puffs into the lungs every 4 (four) hours as needed  for wheezing or shortness of breath.    Historical Provider, MD  ALPRAZolam Duanne Moron) 0.25 MG tablet Take 0.25 mg by mouth 2 (two) times daily as needed for anxiety.    Historical Provider, MD  aspirin EC 81 MG tablet Take 81 mg by mouth daily.    Historical Provider, MD  cyclobenzaprine (FLEXERIL) 10 MG tablet Take 1 tablet (10 mg total) by mouth 3 (three) times daily as needed for muscle spasms. 06/13/16   Rolland Porter, MD  dicyclomine (BENTYL) 10 MG capsule Take 1 capsule (10 mg total) by mouth 3 (three) times daily before meals. 04/23/16   Rogene Houston, MD  Dulaglutide (TRULICITY) [id number] [id number] SOPN Inject 0.5 mLs into the skin once a week. On  Monday's.    Historical Provider, MD  DULoxetine (CYMBALTA) 60 MG capsule Take 60 mg by  mouth daily.    Historical Provider, MD  feeding supplement, ENSURE ENLIVE, (ENSURE ENLIVE) LIQD Take 237 mLs by mouth 2 (two) times daily between meals. 03/19/16   Shanker Kristeen Mans, MD  glipiZIDE (GLUCOTROL) 5 MG tablet Take 5 mg by mouth 2 (two) times daily.     Historical Provider, MD  levothyroxine (SYNTHROID, LEVOTHROID) 75 MCG tablet Take 75 mcg by mouth daily before breakfast.    Historical Provider, MD  lisinopril-hydrochlorothiazide (PRINZIDE,ZESTORETIC) 20-12.5 MG per tablet Take 1 tablet by mouth daily.    Historical Provider, MD  loratadine (CLARITIN) 10 MG tablet Take 10 mg by mouth daily.    Historical Provider, MD  Multiple Vitamin (MULTIVITAMIN WITH MINERALS) TABS tablet Take 1 tablet by mouth daily.    Historical Provider, MD  naproxen (NAPROSYN) 500 MG tablet Take 1 tablet (500 mg total) by mouth 2 (two) times daily. 06/13/16   Rolland Porter, MD  omeprazole (PRILOSEC OTC) 20 MG tablet Take 1 tablet (20 mg total) by mouth daily. 08/30/15   Carmin Muskrat, MD  promethazine (PHENERGAN) 12.5 MG tablet Take 1 tablet (12.5 mg total) by mouth every 6 (six) hours as needed for nausea or vomiting. 05/03/16   Butch Penny, NP  traMADol (ULTRAM) 50 MG tablet Take 2 tablets (100 mg total) by mouth every 6 (six) hours as needed. 06/13/16   Rolland Porter, MD    Family History Family History  Problem Relation Age of Onset  . Cancer Mother     colon cancer age74  . Hypertension Mother   . Stroke Mother   . Diabetes Mother   . Non-Hodgkin's lymphoma Father   . Hypertension Father   . Diabetes Father   . Stroke Maternal Grandmother   . Kidney disease Paternal Aunt   . Cancer Paternal Aunt   . Kidney disease Paternal Uncle   . Cancer Paternal Uncle     Social History Social History  Substance Use Topics  . Smoking status: Never Smoker  . Smokeless tobacco: Never Used  . Alcohol use No  Employed   Allergies   Review of patient's allergies indicates no known allergies.   Review of  Systems Review of Systems  All other systems reviewed and are negative.    Physical Exam Updated Vital Signs BP 143/90   Pulse 88   Temp 98.3 F (36.8 C) (Oral)   Resp 20   Ht 5\' 6"  (1.676 m)   Wt 259 lb (117.5 kg)   LMP  (Within Months)   SpO2 98%   BMI 41.80 kg/m   Vital signs normal    Physical Exam  Constitutional: She is oriented  to person, place, and time. She appears well-developed and well-nourished.  Non-toxic appearance. She does not appear ill. She appears distressed.  HENT:  Head: Normocephalic and atraumatic.  Right Ear: External ear normal.  Left Ear: External ear normal.  Nose: Nose normal. No mucosal edema or rhinorrhea.  Mouth/Throat: Oropharynx is clear and moist and mucous membranes are normal. No dental abscesses or uvula swelling.  Eyes: Conjunctivae and EOM are normal. Pupils are equal, round, and reactive to light.  Neck: Normal range of motion and full passive range of motion without pain. Neck supple.  Cardiovascular: Normal rate, regular rhythm and normal heart sounds.  Exam reveals no gallop and no friction rub.   No murmur heard. Pulmonary/Chest: Effort normal and breath sounds normal. No respiratory distress. She has no wheezes. She has no rhonchi. She has no rales. She exhibits tenderness. She exhibits no crepitus.    Mild pain to palpation in the left upper chest area  Abdominal: Soft. Normal appearance and bowel sounds are normal. She exhibits no distension. There is no tenderness. There is no rebound and no guarding.  Musculoskeletal: Normal range of motion. She exhibits no edema or tenderness.  Moves all extremities well except for her left upper extremity. She is nontender to palpation in her cervical spine, she has very minimal discomfort to palpation of her left trapezius. However when I start palpating her left shoulder and especially in the area of the subdeltoid bursa she is extremely painful to palpation. She resists any range of  motion of her shoulder.  Neurological: She is alert and oriented to person, place, and time. She has normal strength. No cranial nerve deficit.  Skin: Skin is warm, dry and intact. No rash noted. No erythema. No pallor.  Psychiatric: She has a normal mood and affect. Her speech is normal and behavior is normal. Her mood appears not anxious.  Nursing note and vitals reviewed.    ED Treatments / Results  Labs (all labs ordered are listed, but only abnormal results are displayed) Results for orders placed or performed during the hospital encounter of 06/13/16  Troponin I  Result Value Ref Range   Troponin I <0.03 <0.03 ng/mL  Basic metabolic panel  Result Value Ref Range   Sodium 138 135 - 145 mmol/L   Potassium 3.8 3.5 - 5.1 mmol/L   Chloride 106 101 - 111 mmol/L   CO2 24 22 - 32 mmol/L   Glucose, Bld 135 (H) 65 - 99 mg/dL   BUN 20 6 - 20 mg/dL   Creatinine, Ser 0.49 0.44 - 1.00 mg/dL   Calcium 9.1 8.9 - 10.3 mg/dL   GFR calc non Af Amer >60 >60 mL/min   GFR calc Af Amer >60 >60 mL/min   Anion gap 8 5 - 15  CBC with Differential  Result Value Ref Range   WBC 16.3 (H) 4.0 - 10.5 K/uL   RBC 4.80 3.87 - 5.11 MIL/uL   Hemoglobin 13.4 12.0 - 15.0 g/dL   HCT 40.6 36.0 - 46.0 %   MCV 84.6 78.0 - 100.0 fL   MCH 27.9 26.0 - 34.0 pg   MCHC 33.0 30.0 - 36.0 g/dL   RDW 14.2 11.5 - 15.5 %   Platelets 321 150 - 400 K/uL   Neutrophils Relative % 70 %   Lymphocytes Relative 22 %   Monocytes Relative 7 %   Eosinophils Relative 1 %   Basophils Relative 0 %   Neutro Abs 11.4 (H) 1.7 - 7.7  K/uL   Lymphs Abs 3.6 0.7 - 4.0 K/uL   Monocytes Absolute 1.1 (H) 0.1 - 1.0 K/uL   Eosinophils Absolute 0.2 0.0 - 0.7 K/uL   Basophils Absolute 0.0 0.0 - 0.1 K/uL   WBC Morphology MILD LEFT SHIFT (1-5% METAS, OCC MYELO, OCC BANDS)    Laboratory interpretation all normal except Persistent leukocytosis since May.     EKG  EKG Interpretation  Date/Time:  Monday June 13 2016 04:32:41  EDT Ventricular Rate:  86 PR Interval:    QRS Duration: 86 QT Interval:  354 QTC Calculation: 424 R Axis:   68 Text Interpretation:  Sinus rhythm Nonspecific T abnormalities, lateral leads Electrode noise Since last tracing rate slower 27 Nov 2015 Confirmed by [facility]  MD-I, Jahleah Mariscal ([id number]) on 06/13/2016 4:41:24 AM       Radiology No results found.  Procedures Procedures (including critical care time)  Medications Ordered in ED Medications  ketorolac (TORADOL) injection 60 mg (60 mg Intramuscular Given 06/13/16 0505)  diazepam (VALIUM) injection 5 mg (5 mg Intramuscular Given 06/13/16 0506)  LORazepam (ATIVAN) injection 1 mg (1 mg Intramuscular Given 06/13/16 0610)  fentaNYL (SUBLIMAZE) injection 50 mcg (50 mcg Nasal Given 06/13/16 0610)     Initial Impression / Assessment and Plan / ED Course  I have reviewed the triage vital signs and the nursing notes.  Pertinent labs & imaging results that were available during my care of the patient were reviewed by me and considered in my medical decision making (see chart for details).  Clinical Course   Patient's pain appears to be either a bursitis or some other type of musculoskeletal pain in her shoulder. She denies any injury or trauma. She is given Toradol and Valium IM for her pain. Due to her risk factors for heart disease a troponin was done. She states the pain is been there constantly for over 24 hours. Therefore only one troponin will be done.  Recheck at 5:40 AM patient states her pain is minimally better. She was then given fentanyl nasally and Ativan IM.  Recheck at 6:30 AM patient states she's feeling better. She seems to be much more comfortable. We discussed her test results. She is aware of her persistently elevated white blood cell count and her primary care doctor is also aware of it. He is going to repeat her blood work in 4 months. We discussed bursitis. She is to use ice and heat, she was just prescribed the proximal,  Flexeril, tramadol which she can take with acetaminophen. She should return if she gets a fever, redness or swelling of the shoulder joint or worsening pain.  Review the [place] shows patient gets #60 alprazolam 0.25 mg about every 3-4 months by her PCP   Final Clinical Impressions(s) / ED Diagnoses   Final diagnoses:  Acute shoulder pain, left  Bursitis of shoulder region, left    New Prescriptions New Prescriptions   CYCLOBENZAPRINE (FLEXERIL) 10 MG TABLET    Take 1 tablet (10 mg total) by mouth 3 (three) times daily as needed for muscle spasms.   NAPROXEN (NAPROSYN) 500 MG TABLET    Take 1 tablet (500 mg total) by mouth 2 (two) times daily.   TRAMADOL (ULTRAM) 50 MG TABLET    Take 2 tablets (100 mg total) by mouth every 6 (six) hours as needed.    Plan discharge  Rolland Porter, MD, Barbette Or, MD 06/13/16 [phone]

## 2016-06-13 NOTE — ED Notes (Signed)
Patient with no complaints at this time. Respirations even and unlabored. Skin warm/dry. Discharge instructions reviewed with patient at this time. Patient given opportunity to voice concerns/ask questions. Patient discharged at this time and left Emergency Department with steady gait.   

## 2016-11-04 ENCOUNTER — Emergency Department (HOSPITAL_COMMUNITY): Payer: Self-pay

## 2016-11-04 ENCOUNTER — Inpatient Hospital Stay (HOSPITAL_COMMUNITY)
Admission: EM | Admit: 2016-11-04 | Discharge: 2016-11-06 | DRG: 872 | Disposition: A | Discharge status: Home / Self Care | Payer: Self-pay | Attending: Family Medicine | Admitting: Family Medicine

## 2016-11-04 ENCOUNTER — Encounter (HOSPITAL_COMMUNITY): Payer: Self-pay | Admitting: Emergency Medicine

## 2016-11-04 DIAGNOSIS — A419 Sepsis, unspecified organism: Principal | ICD-10-CM | POA: Diagnosis present

## 2016-11-04 DIAGNOSIS — E119 Type 2 diabetes mellitus without complications: Secondary | ICD-10-CM | POA: Diagnosis present

## 2016-11-04 DIAGNOSIS — E876 Hypokalemia: Secondary | ICD-10-CM | POA: Diagnosis present

## 2016-11-04 DIAGNOSIS — I10 Essential (primary) hypertension: Secondary | ICD-10-CM | POA: Diagnosis present

## 2016-11-04 DIAGNOSIS — K219 Gastro-esophageal reflux disease without esophagitis: Secondary | ICD-10-CM | POA: Diagnosis present

## 2016-11-04 DIAGNOSIS — Z7984 Long term (current) use of oral hypoglycemic drugs: Secondary | ICD-10-CM

## 2016-11-04 DIAGNOSIS — K529 Noninfective gastroenteritis and colitis, unspecified: Secondary | ICD-10-CM | POA: Diagnosis present

## 2016-11-04 DIAGNOSIS — E872 Acidosis, unspecified: Secondary | ICD-10-CM

## 2016-11-04 DIAGNOSIS — Z79899 Other long term (current) drug therapy: Secondary | ICD-10-CM

## 2016-11-04 DIAGNOSIS — Z833 Family history of diabetes mellitus: Secondary | ICD-10-CM

## 2016-11-04 DIAGNOSIS — F329 Major depressive disorder, single episode, unspecified: Secondary | ICD-10-CM | POA: Diagnosis present

## 2016-11-04 DIAGNOSIS — Z7982 Long term (current) use of aspirin: Secondary | ICD-10-CM

## 2016-11-04 DIAGNOSIS — Z8249 Family history of ischemic heart disease and other diseases of the circulatory system: Secondary | ICD-10-CM

## 2016-11-04 LAB — CBC
HCT: 51.5 % — ABNORMAL HIGH (ref 36.0–46.0)
HEMOGLOBIN: 17.2 g/dL — AB (ref 12.0–15.0)
MCH: 28.6 pg (ref 26.0–34.0)
MCHC: 33.4 g/dL (ref 30.0–36.0)
MCV: 85.7 fL (ref 78.0–100.0)
Platelets: 417 10*3/uL — ABNORMAL HIGH (ref 150–400)
RBC: 6.01 MIL/uL — AB (ref 3.87–5.11)
RDW: 14.2 % (ref 11.5–15.5)
WBC: 30.9 10*3/uL — AB (ref 4.0–10.5)

## 2016-11-04 LAB — URINALYSIS, ROUTINE W REFLEX MICROSCOPIC
BILIRUBIN URINE: NEGATIVE
Bacteria, UA: NONE SEEN
GLUCOSE, UA: 50 mg/dL — AB
Hgb urine dipstick: NEGATIVE
KETONES UR: NEGATIVE mg/dL
Nitrite: NEGATIVE
PROTEIN: 30 mg/dL — AB
Specific Gravity, Urine: 1.015 (ref 1.005–1.030)
pH: 6 (ref 5.0–8.0)

## 2016-11-04 LAB — COMPREHENSIVE METABOLIC PANEL
ALT: 30 U/L (ref 14–54)
ANION GAP: 13 (ref 5–15)
AST: 28 U/L (ref 15–41)
Albumin: 4 g/dL (ref 3.5–5.0)
Alkaline Phosphatase: 68 U/L (ref 38–126)
BUN: 16 mg/dL (ref 6–20)
CHLORIDE: 101 mmol/L (ref 101–111)
CO2: 24 mmol/L (ref 22–32)
Calcium: 9.2 mg/dL (ref 8.9–10.3)
Creatinine, Ser: 1.16 mg/dL — ABNORMAL HIGH (ref 0.44–1.00)
GFR, EST NON AFRICAN AMERICAN: 52 mL/min — AB (ref >60)
Glucose, Bld: 275 mg/dL — ABNORMAL HIGH (ref 65–99)
POTASSIUM: 3.4 mmol/L — AB (ref 3.5–5.1)
SODIUM: 138 mmol/L (ref 135–145)
Total Bilirubin: 0.6 mg/dL (ref 0.3–1.2)
Total Protein: 7.7 g/dL (ref 6.5–8.1)

## 2016-11-04 LAB — LACTIC ACID, PLASMA
LACTIC ACID, VENOUS: 2.3 mmol/L — AB (ref 0.5–1.9)
Lactic Acid, Venous: 4.1 mmol/L (ref 0.5–1.9)

## 2016-11-04 LAB — LIPASE, BLOOD: LIPASE: 45 U/L (ref 11–51)

## 2016-11-04 LAB — GLUCOSE, CAPILLARY: GLUCOSE-CAPILLARY: 134 mg/dL — AB (ref 65–99)

## 2016-11-04 MED ORDER — LISINOPRIL-HYDROCHLOROTHIAZIDE 20-12.5 MG PO TABS: 1.0000 | ORAL_TABLET | Freq: Every day | ORAL | Status: DC

## 2016-11-04 MED ORDER — OMEPRAZOLE MAGNESIUM 20 MG PO TBEC: 20.0000 mg | DELAYED_RELEASE_TABLET | Freq: Every day | ORAL | Status: DC

## 2016-11-04 MED ORDER — SODIUM CHLORIDE 0.9 % IV BOLUS (SEPSIS)
1000.0000 mL | Freq: Once | INTRAVENOUS | Status: AC
  Administered 2016-11-04: 1000 mL via INTRAVENOUS

## 2016-11-04 MED ORDER — DICYCLOMINE HCL 10 MG/ML IM SOLN: 20.0000 mg | INTRAMUSCULAR | Status: DC | PRN

## 2016-11-04 MED ORDER — DICYCLOMINE HCL 10 MG/ML IM SOLN
20.0000 mg | Freq: Once | INTRAMUSCULAR | Status: AC
  Administered 2016-11-04: 20 mg via INTRAMUSCULAR
  Filled 2016-11-04: qty 2

## 2016-11-04 MED ORDER — HYDROCHLOROTHIAZIDE 12.5 MG PO CAPS
12.5000 mg | ORAL_CAPSULE | Freq: Every day | ORAL | Status: DC
  Administered 2016-11-05 – 2016-11-06 (×2): 12.5 mg via ORAL
  Filled 2016-11-04 (×2): qty 1

## 2016-11-04 MED ORDER — LISINOPRIL 10 MG PO TABS
20.0000 mg | ORAL_TABLET | Freq: Every day | ORAL | Status: DC
  Administered 2016-11-05 – 2016-11-06 (×2): 20 mg via ORAL
  Filled 2016-11-04 (×2): qty 2

## 2016-11-04 MED ORDER — INSULIN ASPART 100 UNIT/ML ~~LOC~~ SOLN: 0.0000 [IU] | Freq: Three times a day (TID) | SUBCUTANEOUS | Status: DC

## 2016-11-04 MED ORDER — ASPIRIN EC 81 MG PO TBEC
81.0000 mg | DELAYED_RELEASE_TABLET | Freq: Every day | ORAL | Status: DC
  Administered 2016-11-05 – 2016-11-06 (×2): 81 mg via ORAL
  Filled 2016-11-04 (×2): qty 1

## 2016-11-04 MED ORDER — PANTOPRAZOLE SODIUM 40 MG PO TBEC
40.0000 mg | DELAYED_RELEASE_TABLET | Freq: Every day | ORAL | Status: DC
  Administered 2016-11-05 – 2016-11-06 (×2): 40 mg via ORAL
  Filled 2016-11-04 (×2): qty 1

## 2016-11-04 MED ORDER — SODIUM CHLORIDE 0.9 % IV SOLN
INTRAVENOUS | Status: AC
  Administered 2016-11-04: 21:00:00 via INTRAVENOUS

## 2016-11-04 MED ORDER — SODIUM CHLORIDE 0.9 % IV SOLN
INTRAVENOUS | Status: DC
  Administered 2016-11-04: 125 mL/h via INTRAVENOUS
  Administered 2016-11-05 (×2): via INTRAVENOUS

## 2016-11-04 MED ORDER — DULAGLUTIDE 0.75 MG/0.5ML ~~LOC~~ SOAJ: 0.5000 mL | SUBCUTANEOUS | Status: DC

## 2016-11-04 MED ORDER — LORATADINE 10 MG PO TABS
10.0000 mg | ORAL_TABLET | Freq: Every day | ORAL | Status: DC
  Administered 2016-11-05 – 2016-11-06 (×2): 10 mg via ORAL
  Filled 2016-11-04 (×2): qty 1

## 2016-11-04 MED ORDER — PIPERACILLIN-TAZOBACTAM 3.375 G IVPB 30 MIN
3.3750 g | Freq: Once | INTRAVENOUS | Status: AC
  Administered 2016-11-04: 3.375 g via INTRAVENOUS
  Filled 2016-11-04: qty 50

## 2016-11-04 MED ORDER — ALPRAZOLAM 0.25 MG PO TABS
0.2500 mg | ORAL_TABLET | Freq: Two times a day (BID) | ORAL | Status: DC | PRN
  Administered 2016-11-04 – 2016-11-06 (×3): 0.25 mg via ORAL
  Filled 2016-11-04 (×3): qty 1

## 2016-11-04 MED ORDER — VANCOMYCIN HCL 10 G IV SOLR
2000.0000 mg | Freq: Once | INTRAVENOUS | Status: AC
  Administered 2016-11-04: 2000 mg via INTRAVENOUS
  Filled 2016-11-04: qty 2000

## 2016-11-04 MED ORDER — IOPAMIDOL (ISOVUE-300) INJECTION 61%
100.0000 mL | Freq: Once | INTRAVENOUS | Status: AC | PRN
  Administered 2016-11-04: 100 mL via INTRAVENOUS

## 2016-11-04 MED ORDER — LEVOTHYROXINE SODIUM 75 MCG PO TABS
75.0000 ug | ORAL_TABLET | Freq: Every day | ORAL | Status: DC
  Administered 2016-11-05 – 2016-11-06 (×2): 75 ug via ORAL
  Filled 2016-11-04 (×2): qty 1

## 2016-11-04 MED ORDER — PIPERACILLIN-TAZOBACTAM 3.375 G IVPB
3.3750 g | Freq: Three times a day (TID) | INTRAVENOUS | Status: DC
  Administered 2016-11-05 – 2016-11-06 (×5): 3.375 g via INTRAVENOUS
  Filled 2016-11-04 (×5): qty 50

## 2016-11-04 MED ORDER — VANCOMYCIN HCL IN DEXTROSE 750-5 MG/150ML-% IV SOLN
750.0000 mg | Freq: Two times a day (BID) | INTRAVENOUS | Status: DC
  Filled 2016-11-04 (×7): qty 150

## 2016-11-04 MED ORDER — ONDANSETRON HCL 4 MG/2ML IJ SOLN
4.0000 mg | Freq: Four times a day (QID) | INTRAMUSCULAR | Status: DC | PRN
  Administered 2016-11-04: 4 mg via INTRAVENOUS
  Filled 2016-11-04: qty 2

## 2016-11-04 MED ORDER — SODIUM CHLORIDE 0.9 % IV BOLUS (SEPSIS): 1000.0000 mL | Freq: Once | INTRAVENOUS | Status: AC

## 2016-11-04 MED ORDER — ONDANSETRON HCL 4 MG/2ML IJ SOLN
4.0000 mg | INTRAMUSCULAR | Status: DC | PRN
  Administered 2016-11-04: 4 mg via INTRAVENOUS
  Filled 2016-11-04: qty 2

## 2016-11-04 MED ORDER — DICYCLOMINE HCL 20 MG PO TABS
20.0000 mg | ORAL_TABLET | ORAL | Status: DC | PRN
  Administered 2016-11-04: 20 mg via ORAL
  Filled 2016-11-04 (×2): qty 1

## 2016-11-04 MED ORDER — ONDANSETRON HCL 4 MG PO TABS
4.0000 mg | ORAL_TABLET | Freq: Four times a day (QID) | ORAL | Status: DC | PRN
Start: 2016-11-04 — End: 2016-11-06

## 2016-11-04 MED ORDER — ACETAMINOPHEN 325 MG PO TABS
650.0000 mg | ORAL_TABLET | Freq: Four times a day (QID) | ORAL | Status: DC | PRN
  Administered 2016-11-05: 650 mg via ORAL
  Filled 2016-11-04: qty 2

## 2016-11-04 MED ORDER — VANCOMYCIN HCL IN DEXTROSE 1-5 GM/200ML-% IV SOLN: 1000.0000 mg | Freq: Once | INTRAVENOUS | Status: DC

## 2016-11-04 MED ORDER — DICYCLOMINE HCL 10 MG/ML IM SOLN
20.0000 mg | Freq: Four times a day (QID) | INTRAMUSCULAR | Status: DC
  Filled 2016-11-04 (×11): qty 2

## 2016-11-04 MED ORDER — DULOXETINE HCL 60 MG PO CPEP
60.0000 mg | ORAL_CAPSULE | Freq: Every day | ORAL | Status: DC
  Administered 2016-11-05 – 2016-11-06 (×2): 60 mg via ORAL
  Filled 2016-11-04 (×2): qty 1

## 2016-11-04 MED ORDER — INSULIN ASPART 100 UNIT/ML ~~LOC~~ SOLN: 0.0000 [IU] | Freq: Every day | SUBCUTANEOUS | Status: DC

## 2016-11-04 MED ORDER — IOPAMIDOL (ISOVUE-300) INJECTION 61%
INTRAVENOUS | Status: AC
  Administered 2016-11-04: 30 mL via ORAL
  Filled 2016-11-04: qty 30

## 2016-11-04 MED ORDER — GLIPIZIDE 5 MG PO TABS
5.0000 mg | ORAL_TABLET | Freq: Two times a day (BID) | ORAL | Status: DC
  Administered 2016-11-05 – 2016-11-06 (×3): 5 mg via ORAL
  Filled 2016-11-04 (×3): qty 1

## 2016-11-04 MED ORDER — ACETAMINOPHEN 650 MG RE SUPP: 650.0000 mg | Freq: Four times a day (QID) | RECTAL | Status: DC | PRN

## 2016-11-04 NOTE — ED Notes (Signed)
CRITICAL VALUE ALERT  Critical value received:  Lactic Acid 4.1  Date of notification:  11/04/16  Time of notification:  [id number]  Critical value read back:Yes.    Nurse who received alert:  Laurell Josephs RN  MD notified (1st page):  Thurnell Garbe  Time of first page:  85  MD notified (2nd page):  Time of second page:  Responding MD:  Thurnell Garbe  Time MD responded:  [id number]

## 2016-11-04 NOTE — ED Provider Notes (Signed)
[facility] DEPT Provider Note   CSN: [id number] Arrival date & time: 11/04/16  1439     History   Chief Complaint Chief Complaint  Patient presents with  . Abdominal Pain    HPI Kristin Bailey is a 55 y.o. female.  HPI  Pt was seen at 1500. Per pt, c/o gradual onset and persistence of multiple intermittent episodes of N/V/D that began 2 hours ago.   Describes the stools as "watery." Has been associated with generalized abd "pain" and lightheadedness. States her symptoms "are like the last time I had colitis."  Denies CP/SOB, no back pain, no fevers, no black or blood in stools or emesis.    Past Medical History:  Diagnosis Date  . Depression   . Diabetes mellitus without complication ([place])   . Diverticulitis   . GERD (gastroesophageal reflux disease)   . Hypertension   . IBS (irritable bowel syndrome)   . Pneumonia   . Thyroid disease     Patient Active Problem List   Diagnosis Date Noted  . Enterocolitis 03/17/2016  . Gastroenteritis, infectious 03/17/2016  . Leukocytosis 11/28/2015  . Acute respiratory failure with hypoxia ([place]) 11/28/2015  . Controlled type 2 diabetes mellitus without complication, without long-term current use of insulin ([place]) 11/28/2015  . Septic shock ([place]) 11/27/2015  . UTI (lower urinary tract infection) 11/27/2015  . Colitis 11/27/2015  . AKI (acute kidney injury) ([place]) 11/27/2015  . Depression     Past Surgical History:  Procedure Laterality Date  . CARPAL TUNNEL RELEASE    . CESAREAN SECTION    . CHOLECYSTECTOMY    . COLONOSCOPY N/A 08/28/2014   Procedure: COLONOSCOPY;  Surgeon: Rogene Houston, MD;  Location: [facility];  Service: Endoscopy;  Laterality: N/A;  1030  . CYST REMOVAL HAND    . ESOPHAGOGASTRODUODENOSCOPY N/A 03/19/2016   Procedure: ESOPHAGOGASTRODUODENOSCOPY (EGD);  Surgeon: Rogene Houston, MD;  Location: [facility];  Service: Endoscopy;  Laterality: N/A;  . FRACTURE SURGERY     rt ankle   . KNEE SURGERY      meniscus repair  . TUBAL LIGATION      OB History    Gravida Para Term Preterm AB Living   1 1 1     1    SAB TAB Ectopic Multiple Live Births                   Home Medications    Prior to Admission medications   Medication Sig Start Date End Date Taking? Authorizing Provider  acetaminophen (TYLENOL) 650 MG CR tablet Take 1,300 mg by mouth 2 (two) times daily as needed for pain.    Historical Provider, MD  albuterol (PROVENTIL HFA;VENTOLIN HFA) 108 (90 BASE) MCG/ACT inhaler Inhale 2 puffs into the lungs every 4 (four) hours as needed for wheezing or shortness of breath.    Historical Provider, MD  ALPRAZolam Duanne Moron) 0.25 MG tablet Take 0.25 mg by mouth 2 (two) times daily as needed for anxiety.    Historical Provider, MD  aspirin EC 81 MG tablet Take 81 mg by mouth daily.    Historical Provider, MD  cyclobenzaprine (FLEXERIL) 10 MG tablet Take 1 tablet (10 mg total) by mouth 3 (three) times daily as needed for muscle spasms. 06/13/16   Rolland Porter, MD  dicyclomine (BENTYL) 10 MG capsule Take 1 capsule (10 mg total) by mouth 3 (three) times daily before meals. 04/23/16   Rogene Houston, MD  Dulaglutide (TRULICITY) [id number]  SOPN Inject 0.5 mLs into the skin once a week. On  Monday's.    Historical Provider, MD  DULoxetine (CYMBALTA) 60 MG capsule Take 60 mg by mouth daily.    Historical Provider, MD  feeding supplement, ENSURE ENLIVE, (ENSURE ENLIVE) LIQD Take 237 mLs by mouth 2 (two) times daily between meals. 03/19/16   Shanker Kristeen Mans, MD  glipiZIDE (GLUCOTROL) 5 MG tablet Take 5 mg by mouth 2 (two) times daily.     Historical Provider, MD  levothyroxine (SYNTHROID, LEVOTHROID) 75 MCG tablet Take 75 mcg by mouth daily before breakfast.    Historical Provider, MD  lisinopril-hydrochlorothiazide (PRINZIDE,ZESTORETIC) 20-12.5 MG per tablet Take 1 tablet by mouth daily.    Historical Provider, MD  loratadine (CLARITIN) 10 MG tablet Take 10 mg by mouth daily.    Historical Provider, MD   Multiple Vitamin (MULTIVITAMIN WITH MINERALS) TABS tablet Take 1 tablet by mouth daily.    Historical Provider, MD  naproxen (NAPROSYN) 500 MG tablet Take 1 tablet (500 mg total) by mouth 2 (two) times daily. 06/13/16   Rolland Porter, MD  omeprazole (PRILOSEC OTC) 20 MG tablet Take 1 tablet (20 mg total) by mouth daily. 08/30/15   Carmin Muskrat, MD  promethazine (PHENERGAN) 12.5 MG tablet Take 1 tablet (12.5 mg total) by mouth every 6 (six) hours as needed for nausea or vomiting. 05/03/16   Butch Penny, NP  traMADol (ULTRAM) 50 MG tablet Take 2 tablets (100 mg total) by mouth every 6 (six) hours as needed. 06/13/16   Rolland Porter, MD    Family History Family History  Problem Relation Age of Onset  . Cancer Mother     colon cancer age74  . Hypertension Mother   . Stroke Mother   . Diabetes Mother   . Non-Hodgkin's lymphoma Father   . Hypertension Father   . Diabetes Father   . Stroke Maternal Grandmother   . Kidney disease Paternal Aunt   . Cancer Paternal Aunt   . Kidney disease Paternal Uncle   . Cancer Paternal Uncle     Social History Social History  Substance Use Topics  . Smoking status: Never Smoker  . Smokeless tobacco: Never Used  . Alcohol use No     Allergies   Patient has no known allergies.   Review of Systems Review of Systems ROS: Statement: All systems negative except as marked or noted in the HPI; Constitutional: Negative for fever and chills. ; ; Eyes: Negative for eye pain, redness and discharge. ; ; ENMT: Negative for ear pain, hoarseness, nasal congestion, sinus pressure and sore throat. ; ; Cardiovascular: Negative for chest pain, palpitations, diaphoresis, dyspnea and peripheral edema. ; ; Respiratory: Negative for cough, wheezing and stridor. ; ; Gastrointestinal: +N/V/D, abd pain. Negative for blood in stool, hematemesis, jaundice and rectal bleeding. . ; ; Genitourinary: Negative for dysuria, flank pain and hematuria. ; ; Musculoskeletal: Negative for  back pain and neck pain. Negative for swelling and trauma.; ; Skin: Negative for pruritus, rash, abrasions, blisters, bruising and skin lesion.; ; Neuro: +lightheadedness. Negative for headache and neck stiffness. Negative for weakness, altered level of consciousness, altered mental status, extremity weakness, paresthesias, involuntary movement, seizure and syncope.     Physical Exam Updated Vital Signs BP (!) 81/48 (BP Location: Left Arm)   Pulse (!) 122   Temp 98.2 F (36.8 C) (Oral)   Resp 22   Ht 5\' 6"  (1.676 m)   Wt 260 lb (117.9 kg)   LMP  (  Within Months)   SpO2 97%   BMI 41.97 kg/m    Patient Vitals for the past 24 hrs:  BP Temp Temp src Pulse Resp SpO2 Height Weight  11/04/16 1730 112/72 - - 89 16 99 % - -  11/04/16 1700 103/64 - - 86 17 99 % - -  11/04/16 1652 114/77 97.7 F (36.5 C) Oral 87 20 98 % - -  11/04/16 1530 (!) 80/67 - - - 21 - - -  11/04/16 1447 (!) 81/48 98.2 F (36.8 C) Oral (!) 122 22 97 % - -  11/04/16 1446 - - - - - - 5\' 6"  (1.676 m) 260 lb (117.9 kg)     Physical Exam 1505: Physical examination:  Nursing notes reviewed; Vital signs and O2 SAT reviewed;  Constitutional: Well developed, Well nourished, Uncomfortable appearing.; Head:  Normocephalic, atraumatic; Eyes: EOMI, PERRL, No scleral icterus; ENMT: Mouth and pharynx normal, Mucous membranes dry; Neck: Supple, Full range of motion, No lymphadenopathy; Cardiovascular: Tachycardic rate and rhythm, No gallop; Respiratory: Breath sounds clear & equal bilaterally, No wheezes.  Speaking full sentences with ease, Normal respiratory effort/excursion; Chest: Nontender, Movement normal; Abdomen: Soft, +diffuse tenderness to palp. No rebound or guarding. Nondistended, Normal bowel sounds; Genitourinary: No CVA tenderness; Extremities: Pulses normal, No tenderness, No edema, No calf edema or asymmetry.; Neuro: AA&Ox3, Major CN grossly intact.  Speech clear. No gross focal motor or sensory deficits in extremities.;  Skin: Color normal, Warm, Dry.   ED Treatments / Results  Labs (all labs ordered are listed, but only abnormal results are displayed)   EKG  EKG Interpretation  Date/Time:  Friday November 04 2016 16:23:09 EST Ventricular Rate:  93 PR Interval:    QRS Duration: 109 QT Interval:  403 QTC Calculation: 502 R Axis:   79 Text Interpretation:  Sinus rhythm Low voltage, precordial leads Nonspecific T abnormalities, lateral leads Borderline prolonged QT interval Baseline wander Artifact When compared with ECG of 06/13/2016 QT has lengthened Confirmed by [facility]  MD, Nunzio Cory [phone]) on 11/04/2016 4:45:11 PM       Radiology   Procedures Procedures (including critical care time)  Medications Ordered in ED Medications  sodium chloride 0.9 % bolus 1,000 mL (not administered)  0.9 %  sodium chloride infusion (not administered)  ondansetron (ZOFRAN) injection 4 mg (not administered)  dicyclomine (BENTYL) injection 20 mg (not administered)     Initial Impression / Assessment and Plan / ED Course  I have reviewed the triage vital signs and the nursing notes.  Pertinent labs & imaging results that were available during my care of the patient were reviewed by me and considered in my medical decision making (see chart for details).  MDM Reviewed: previous chart, nursing note and vitals Reviewed previous: labs and CT scan Interpretation: labs, x-ray and CT scan Total time providing critical care: 30-74 minutes. This excludes time spent performing separately reportable procedures and services. Consults: admitting MD   CRITICAL CARE Performed by: Alfonzo Feller Total critical care time: 35 minutes Critical care time was exclusive of separately billable procedures and treating other patients. Critical care was necessary to treat or prevent imminent or life-threatening deterioration. Critical care was time spent personally by me on the following activities: development of treatment  plan with patient and/or surrogate as well as nursing, discussions with consultants, evaluation of patient's response to treatment, examination of patient, obtaining history from patient or surrogate, ordering and performing treatments and interventions, ordering and review of laboratory studies, ordering and review  of radiographic studies, pulse oximetry and re-evaluation of patient's condition.  Results for orders placed or performed during the hospital encounter of 11/04/16  Blood Culture (routine x 2)  Result Value Ref Range   Specimen Description BLOOD RIGHT FOREARM    Special Requests BOTTLES DRAWN AEROBIC AND ANAEROBIC 6CC EACH    Culture PENDING    Report Status PENDING   Blood Culture (routine x 2)  Result Value Ref Range   Specimen Description LEFT ANTECUBITAL    Special Requests BOTTLES DRAWN AEROBIC AND ANAEROBIC 4CC EACH    Culture PENDING    Report Status PENDING   Lipase, blood  Result Value Ref Range   Lipase 45 11 - 51 U/L  Comprehensive metabolic panel  Result Value Ref Range   Sodium 138 135 - 145 mmol/L   Potassium 3.4 (L) 3.5 - 5.1 mmol/L   Chloride 101 101 - 111 mmol/L   CO2 24 22 - 32 mmol/L   Glucose, Bld 275 (H) 65 - 99 mg/dL   BUN 16 6 - 20 mg/dL   Creatinine, Ser 1.16 (H) 0.44 - 1.00 mg/dL   Calcium 9.2 8.9 - 10.3 mg/dL   Total Protein 7.7 6.5 - 8.1 g/dL   Albumin 4.0 3.5 - 5.0 g/dL   AST 28 15 - 41 U/L   ALT 30 14 - 54 U/L   Alkaline Phosphatase 68 38 - 126 U/L   Total Bilirubin 0.6 0.3 - 1.2 mg/dL   GFR calc non Af Amer 52 (L) >60 mL/min   GFR calc Af Amer >60 >60 mL/min   Anion gap 13 5 - 15  CBC  Result Value Ref Range   WBC 30.9 (H) 4.0 - 10.5 K/uL   RBC 6.01 (H) 3.87 - 5.11 MIL/uL   Hemoglobin 17.2 (H) 12.0 - 15.0 g/dL   HCT 51.5 (H) 36.0 - 46.0 %   MCV 85.7 78.0 - 100.0 fL   MCH 28.6 26.0 - 34.0 pg   MCHC 33.4 30.0 - 36.0 g/dL   RDW 14.2 11.5 - 15.5 %   Platelets 417 (H) 150 - 400 K/uL  Urinalysis, Routine w reflex microscopic  Result  Value Ref Range   Color, Urine YELLOW YELLOW   APPearance CLOUDY (A) CLEAR   Specific Gravity, Urine 1.015 1.005 - 1.030   pH 6.0 5.0 - 8.0   Glucose, UA 50 (A) NEGATIVE mg/dL   Hgb urine dipstick NEGATIVE NEGATIVE   Bilirubin Urine NEGATIVE NEGATIVE   Ketones, ur NEGATIVE NEGATIVE mg/dL   Protein, ur 30 (A) NEGATIVE mg/dL   Nitrite NEGATIVE NEGATIVE   Leukocytes, UA TRACE (A) NEGATIVE   RBC / HPF 0-5 0 - 5 RBC/hpf   WBC, UA 6-30 0 - 5 WBC/hpf   Bacteria, UA NONE SEEN NONE SEEN   Mucous PRESENT    Hyaline Casts, UA PRESENT   Lactic acid, plasma  Result Value Ref Range   Lactic Acid, Venous 4.1 (HH) 0.5 - 1.9 mmol/L    Ct Abdomen Pelvis W Contrast Result Date: 11/04/2016 CLINICAL DATA:  Nausea, vomiting, diarrhea, abdominal pain, history hypertension, diabetes mellitus, GERD, diverticulitis, irritable bowel syndrome, cholecystectomy EXAM: CT ABDOMEN AND PELVIS WITH CONTRAST TECHNIQUE: Multidetector CT imaging of the abdomen and pelvis was performed using the standard protocol following bolus administration of intravenous contrast. Sagittal and coronal MPR images reconstructed from axial data set. CONTRAST:  52mL ISOVUE-300 IOPAMIDOL (ISOVUE-300) INJECTION 61%, 155mL ISOVUE-300 IOPAMIDOL (ISOVUE-300) INJECTION 61% COMPARISON:  03/17/2016 FINDINGS: Lower chest: Lung bases clear.  Hepatobiliary: Gallbladder surgically absent. Fatty infiltration of liver. No focal hepatic lesions. Pancreas: Normal appearance Spleen: Normal appearance Adrenals/Urinary Tract: Adrenal glands normal appearance. Kidneys, ureters, and bladder normal appearance. Stomach/Bowel: Normal appendix. Stomach unremarkable. Scattered wall thickening of the descending and sigmoid colon as well as of multiple mid abdominal small bowel loops. Edema throughout small bowel mesenteries. Findings are consistent with a diffuse enteritis. Scattered diverticula of the descending and sigmoid colon without focal pericolic inflammatory changes  to suggest acute diverticulitis. Vascular/Lymphatic: Aorta normal caliber. No adenopathy. Few scattered tiny nodes within small bowel mesenteric. Reproductive: Unremarkable uterus and adnexa. Other: Dependent low-attenuation free pelvic fluid. Additional minimal fluid interloop. No free air or hernia. Musculoskeletal: Osseous structures unremarkable. IMPRESSION: Scattered wall thickening of small bowel loops and distal half of colon associated with small bowel mesenteric edema consistent with enteritis ; differential diagnosis includes infection and inflammatory bowel disease. Nonspecific free pelvic fluid. Distal colonic diverticulosis without focal evidence of diverticulitis. Post cholecystectomy. Fatty infiltration of liver. Electronically Signed   By: Lavonia Dana M.D.   On: 11/04/2016 17:42   Dg Chest Port 1 View Result Date: 11/04/2016 CLINICAL DATA:  Nausea, vomiting, and diarrhea associated with abdominal pain beginning today. History of diabetes and irritable bowel disease. EXAM: PORTABLE CHEST 1 VIEW COMPARISON:  Chest x-ray of November 30, 2013 and chest CT scan of November 27, 2015. FINDINGS: The lungs are well-expanded. There is no focal infiltrate. There is no pleural effusion or pneumothorax. The heart and pulmonary vascularity are normal. The mediastinum is normal in width. There is no pleural effusion. The bony thorax exhibits no acute abnormality. IMPRESSION: Mild hyperinflation may be voluntary or may reflect underlying reactive airway disease or COPD. There is no pneumonia, CHF, nor other acute cardiopulmonary abnormality. Electronically Signed   By: David  Martinique M.D.   On: 11/04/2016 15:34    1610:  Pt hypotensive and tachycardic on arrival, afebrile. Initially thought to be volume depleted d/t N/V/D; IVF NS boluses ordered.  WBC count 30.9. Code Sepsis called and additional orders placed.  1755:  BP increasing after IVF. Pt appears more comfortable now: A&O, NAD, resps easy. Dx and  testing d/w pt and family.  Questions answered.  Verb understanding, agreeable to admit. T/C to [facility]. Nehemiah Settle, case discussed, including:  HPI, pertinent PM/SHx, VS/PE, dx testing, ED course and treatment:  Agreeable to admit, requests to write temporary orders, obtain inpt medical bed to team [facility].   Final Clinical Impressions(s) / ED Diagnoses   Final diagnoses:  None    New Prescriptions New Prescriptions   No medications on file     Francine Graven, DO 11/07/16 1708

## 2016-11-04 NOTE — ED Triage Notes (Signed)
Pt reports n/v/d and abd pain since today.  Pt has been dx with colitis in past. Pt filled up half of an emesis bag since being in ED.

## 2016-11-04 NOTE — H&P (Signed)
History and Physical  Kristin Bailey [id number] DOB: 25-Nov-1960 DOA: 11/04/2016  Referring physician: Dr Thurnell Garbe, ED physician PCP: Rory Percy, MD  Outpatient Specialists:   [facility] (GI)  Chief Complaint: abdominal pain, vomiting  HPI: Kristin Bailey is a 55 y.o. female with a history of DM2, HTN, GERD, IBS, thyroid disease. Patient has had 3 hospitalizations for enteritis over the past year. These episodes occur suddenly. Patient had symptoms that started at 1 PM. She had abdominal pain that was in the upper quadrants bilaterally. No radiation. She vomited stomach contents and had severe diarrhea. Had mild blood in her stool. No palliating or provoking factors. Bentyl did help her abdominal pain.  Emergency Department Course: Patient received IV fluids in the emergency department. Elevated lactic acid with a white count of 30. Started on vancomycin and Zosyn due to sepsis protocol that the patient was started on when she became hypotensive.  Review of Systems:   Pt denies any fevers, chills, constipation, shortness of breath, dyspnea on exertion, orthopnea, cough, wheezing, palpitations, headache, vision changes, lightheadedness, dizziness, melena, rectal bleeding.  Review of systems are otherwise negative  Past Medical History:  Diagnosis Date  . Depression   . Diabetes mellitus without complication ([place])   . Diverticulitis   . GERD (gastroesophageal reflux disease)   . Hypertension   . IBS (irritable bowel syndrome)   . Pneumonia   . Thyroid disease    Past Surgical History:  Procedure Laterality Date  . CARPAL TUNNEL RELEASE    . CESAREAN SECTION    . CHOLECYSTECTOMY    . COLONOSCOPY N/A 08/28/2014   Procedure: COLONOSCOPY;  Surgeon: Rogene Houston, MD;  Location: [facility];  Service: Endoscopy;  Laterality: N/A;  1030  . CYST REMOVAL HAND    . ESOPHAGOGASTRODUODENOSCOPY N/A 03/19/2016   Procedure: ESOPHAGOGASTRODUODENOSCOPY (EGD);  Surgeon: Rogene Houston,  MD;  Location: [facility];  Service: Endoscopy;  Laterality: N/A;  . FRACTURE SURGERY     rt ankle   . KNEE SURGERY     meniscus repair  . TUBAL LIGATION     Social History:  reports that she has never smoked. She has never used smokeless tobacco. She reports that she does not drink alcohol or use drugs. Patient lives at home  No Known Allergies  Family History  Problem Relation Age of Onset  . Cancer Mother     colon cancer age74  . Hypertension Mother   . Stroke Mother   . Diabetes Mother   . Non-Hodgkin's lymphoma Father   . Hypertension Father   . Diabetes Father   . Stroke Maternal Grandmother   . Kidney disease Paternal Aunt   . Cancer Paternal Aunt   . Kidney disease Paternal Uncle   . Cancer Paternal Uncle       Prior to Admission medications   Medication Sig Start Date End Date Taking? Authorizing Provider  acetaminophen (TYLENOL) 650 MG CR tablet Take 1,300 mg by mouth daily.    Yes Historical Provider, MD  albuterol (PROVENTIL HFA;VENTOLIN HFA) 108 (90 BASE) MCG/ACT inhaler Inhale 2 puffs into the lungs every 4 (four) hours as needed for wheezing or shortness of breath.   Yes Historical Provider, MD  ALPRAZolam (XANAX) 0.25 MG tablet Take 0.25 mg by mouth 2 (two) times daily as needed for anxiety.   Yes Historical Provider, MD  aspirin EC 81 MG tablet Take 81 mg by mouth daily.   Yes Historical Provider, MD  dicyclomine (BENTYL)  10 MG capsule Take 1 capsule (10 mg total) by mouth 3 (three) times daily before meals. 04/23/16  Yes Rogene Houston, MD  Dulaglutide (TRULICITY) [id number] [id number] SOPN Inject 0.5 mLs into the skin every Monday. On  Monday's.    Yes Historical Provider, MD  DULoxetine (CYMBALTA) 60 MG capsule Take 60 mg by mouth daily.   Yes Historical Provider, MD  glipiZIDE (GLUCOTROL) 5 MG tablet Take 5 mg by mouth 2 (two) times daily.    Yes Historical Provider, MD  levothyroxine (SYNTHROID, LEVOTHROID) 75 MCG tablet Take 75 mcg by mouth daily before  breakfast.   Yes Historical Provider, MD  lisinopril-hydrochlorothiazide (PRINZIDE,ZESTORETIC) 20-12.5 MG per tablet Take 1 tablet by mouth daily.   Yes Historical Provider, MD  loratadine (CLARITIN) 10 MG tablet Take 10 mg by mouth daily.   Yes Historical Provider, MD  Multiple Vitamin (MULTIVITAMIN WITH MINERALS) TABS tablet Take 1 tablet by mouth daily.   Yes Historical Provider, MD  omeprazole (PRILOSEC OTC) 20 MG tablet Take 1 tablet (20 mg total) by mouth daily. 08/30/15  Yes Carmin Muskrat, MD    Physical Exam: BP 112/65 (BP Location: Left Arm)   Pulse 90   Temp 98.2 F (36.8 C) (Oral)   Resp 20   Ht 5\' 6"  (1.676 m)   Wt 117.9 kg (260 lb)   LMP  (Within Months)   SpO2 95%   BMI 41.97 kg/m   General: Middle-aged Caucasian female. Awake and alert and oriented x3. No acute cardiopulmonary distress.  HEENT: Normocephalic atraumatic.  Right and left ears normal in appearance.  Pupils equal, round, reactive to light. Extraocular muscles are intact. Sclerae anicteric and noninjected.  Moist mucosal membranes. No mucosal lesions.  Neck: Neck supple without lymphadenopathy. No carotid bruits. No masses palpated.  Cardiovascular: Regular rate with normal S1-S2 sounds. No murmurs, rubs, gallops auscultated. No JVD.  Respiratory: Good respiratory effort with no wheezes, rales, rhonchi. Lungs clear to auscultation bilaterally.  No accessory muscle use. Abdomen: Soft, diffuse upper quadrant tenderness without radiation, guarding, rebound. Nondistended. Active bowel sounds. No masses or hepatosplenomegaly  Skin: No rashes, lesions, or ulcerations.  Dry, warm to touch. 2+ dorsalis pedis and radial pulses. Musculoskeletal: No calf or leg pain. All major joints not erythematous nontender.  No upper or lower joint deformation.  Good ROM.  No contractures  Psychiatric: Intact judgment and insight. Pleasant and cooperative. Neurologic: No focal neurological deficits. Strength is 5/5 and symmetric in  upper and lower extremities.  Cranial nerves II through XII are grossly intact.           Labs on Admission: I have personally reviewed following labs and imaging studies  CBC:  Recent Labs Lab 11/04/16 1523  WBC 30.9*  HGB 17.2*  HCT 51.5*  MCV 85.7  PLT [id number]*   Basic Metabolic Panel:  Recent Labs Lab 11/04/16 1523  NA 138  K 3.4*  CL 101  CO2 24  GLUCOSE 275*  BUN 16  CREATININE 1.16*  CALCIUM 9.2   GFR: Estimated Creatinine Clearance: 72.4 mL/min (by C-G formula based on SCr of 1.16 mg/dL (H)). Liver Function Tests:  Recent Labs Lab 11/04/16 1523  AST 28  ALT 30  ALKPHOS 68  BILITOT 0.6  PROT 7.7  ALBUMIN 4.0    Recent Labs Lab 11/04/16 1523  LIPASE 45   No results for input(s): AMMONIA in the last 168 hours. Coagulation Profile: No results for input(s): INR, PROTIME in the last 168 hours. Cardiac Enzymes:  No results for input(s): CKTOTAL, CKMB, CKMBINDEX, TROPONINI in the last 168 hours. BNP (last 3 results) No results for input(s): PROBNP in the last 8760 hours. HbA1C: No results for input(s): HGBA1C in the last 72 hours. CBG: No results for input(s): GLUCAP in the last 168 hours. Lipid Profile: No results for input(s): CHOL, HDL, LDLCALC, TRIG, CHOLHDL, LDLDIRECT in the last 72 hours. Thyroid Function Tests: No results for input(s): TSH, T4TOTAL, FREET4, T3FREE, THYROIDAB in the last 72 hours. Anemia Panel: No results for input(s): VITAMINB12, FOLATE, FERRITIN, TIBC, IRON, RETICCTPCT in the last 72 hours. Urine analysis:    Component Value Date/Time   COLORURINE YELLOW 11/04/2016 1651   APPEARANCEUR CLOUDY (A) 11/04/2016 1651   LABSPEC 1.015 11/04/2016 1651   PHURINE 6.0 11/04/2016 1651   GLUCOSEU 50 (A) 11/04/2016 1651   HGBUR NEGATIVE 11/04/2016 1651   BILIRUBINUR NEGATIVE 11/04/2016 1651   KETONESUR NEGATIVE 11/04/2016 1651   PROTEINUR 30 (A) 11/04/2016 1651   UROBILINOGEN 0.2 08/30/2015 1420   NITRITE NEGATIVE 11/04/2016 1651     LEUKOCYTESUR TRACE (A) 11/04/2016 1651   Sepsis Labs: @LABRCNTIP (procalcitonin:4,lacticidven:4) ) Recent Results (from the past 240 hour(s))  Blood Culture (routine x 2)     Status: None (Preliminary result)   Collection Time: 11/04/16  4:46 PM  Result Value Ref Range Status   Specimen Description BLOOD RIGHT FOREARM  Final   Special Requests BOTTLES DRAWN AEROBIC AND ANAEROBIC [facility] EACH  Final   Culture PENDING  Incomplete   Report Status PENDING  Incomplete  Blood Culture (routine x 2)     Status: None (Preliminary result)   Collection Time: 11/04/16  4:46 PM  Result Value Ref Range Status   Specimen Description LEFT ANTECUBITAL  Final   Special Requests BOTTLES DRAWN AEROBIC AND ANAEROBIC 4CC EACH  Final   Culture PENDING  Incomplete   Report Status PENDING  Incomplete     Radiological Exams on Admission: Ct Abdomen Pelvis W Contrast  Result Date: 11/04/2016 CLINICAL DATA:  Nausea, vomiting, diarrhea, abdominal pain, history hypertension, diabetes mellitus, GERD, diverticulitis, irritable bowel syndrome, cholecystectomy EXAM: CT ABDOMEN AND PELVIS WITH CONTRAST TECHNIQUE: Multidetector CT imaging of the abdomen and pelvis was performed using the standard protocol following bolus administration of intravenous contrast. Sagittal and coronal MPR images reconstructed from axial data set. CONTRAST:  74mL ISOVUE-300 IOPAMIDOL (ISOVUE-300) INJECTION 61%, 142mL ISOVUE-300 IOPAMIDOL (ISOVUE-300) INJECTION 61% COMPARISON:  03/17/2016 FINDINGS: Lower chest: Lung bases clear. Hepatobiliary: Gallbladder surgically absent. Fatty infiltration of liver. No focal hepatic lesions. Pancreas: Normal appearance Spleen: Normal appearance Adrenals/Urinary Tract: Adrenal glands normal appearance. Kidneys, ureters, and bladder normal appearance. Stomach/Bowel: Normal appendix. Stomach unremarkable. Scattered wall thickening of the descending and sigmoid colon as well as of multiple mid abdominal small bowel  loops. Edema throughout small bowel mesenteries. Findings are consistent with a diffuse enteritis. Scattered diverticula of the descending and sigmoid colon without focal pericolic inflammatory changes to suggest acute diverticulitis. Vascular/Lymphatic: Aorta normal caliber. No adenopathy. Few scattered tiny nodes within small bowel mesenteric. Reproductive: Unremarkable uterus and adnexa. Other: Dependent low-attenuation free pelvic fluid. Additional minimal fluid interloop. No free air or hernia. Musculoskeletal: Osseous structures unremarkable. IMPRESSION: Scattered wall thickening of small bowel loops and distal half of colon associated with small bowel mesenteric edema consistent with enteritis ; differential diagnosis includes infection and inflammatory bowel disease. Nonspecific free pelvic fluid. Distal colonic diverticulosis without focal evidence of diverticulitis. Post cholecystectomy. Fatty infiltration of liver. Electronically Signed   By: Crist Infante.D.  On: 11/04/2016 17:42   Dg Chest Port 1 View  Result Date: 11/04/2016 CLINICAL DATA:  Nausea, vomiting, and diarrhea associated with abdominal pain beginning today. History of diabetes and irritable bowel disease. EXAM: PORTABLE CHEST 1 VIEW COMPARISON:  Chest x-ray of November 30, 2013 and chest CT scan of November 27, 2015. FINDINGS: The lungs are well-expanded. There is no focal infiltrate. There is no pleural effusion or pneumothorax. The heart and pulmonary vascularity are normal. The mediastinum is normal in width. There is no pleural effusion. The bony thorax exhibits no acute abnormality. IMPRESSION: Mild hyperinflation may be voluntary or may reflect underlying reactive airway disease or COPD. There is no pneumonia, CHF, nor other acute cardiopulmonary abnormality. Electronically Signed   By: David  Martinique M.D.   On: 11/04/2016 15:34    EKG: Independently reviewed. Sinus rhythm. Overlying prolonged QT C. Unspecific T-wave  abnormalities in the lateral leads, however no acute ST elevation or depression. Slightly prolonged QTC compared to previous  Assessment/Plan: Principal Problem:   Sepsis ([place]) Active Problems:   Controlled type 2 diabetes mellitus without complication, without long-term current use of insulin (HCC)   Enteritis   Lactic acidosis    This patient was discussed with the ED physician, including pertinent vitals, physical exam findings, labs, and imaging.  We also discussed care given by the ED provider.  #1 sepsis - recovered  Admit  Patient received IV fluids and is improved #2 enteritis  Discontinue vancomycin  Clear liquid diet  Continue Zosyn - to be able to transition patient to Flagyl and Cipro when she gets discharged  Bentyl for pain  CBC tomorrow #3 lactic acid  Follow lactic acid level #4 controlled type 2 diabetes  Sliding scale insulin  Home regimen #5 IBS  DVT prophylaxis: SCDs Consultants: None Code Status: Full code Family Communication: Husband and stepdaughter in the room  Disposition Plan: Patient should be able to return home following admission   Truett Mainland, DO [facility] Pager [phone]  If 7PM-7AM, please contact night-coverage [email or web address] Password [email or web address]

## 2016-11-04 NOTE — Progress Notes (Signed)
Pharmacy Antibiotic Note  Kristin Bailey is a 55 y.o. female admitted on 11/04/2016 with sepsis.  Pharmacy has been consulted for Vancomycin & Zosyn dosing. Calculated normalized CrCl 62 ml/min Plan: Vancomycin 2 GM IV loading dose, then 750 mg IV every 12 hours Zosyn 3.375 GM IV every 8 hours Vancomycin trough at steady state Labs per protocol  Height: 5\' 6"  (167.6 cm) Weight: 260 lb (117.9 kg) IBW/kg (Calculated) : 59.3  Temp (24hrs), Avg:98.2 F (36.8 C), Min:98.2 F (36.8 C), Max:98.2 F (36.8 C)   Recent Labs Lab 11/04/16 1523 11/04/16 1524  WBC 30.9*  --   CREATININE 1.16*  --   LATICACIDVEN  --  4.1*    Estimated Creatinine Clearance: 72.4 mL/min (by C-G formula based on SCr of 1.16 mg/dL (H)).    No Known Allergies  Antimicrobials this admission: Vancomycin 12/22 >>  Zosyn 12/22 >>     Thank you for allowing pharmacy to be a part of this patient's care.  Chriss Czar 11/04/2016 4:41 PM

## 2016-11-04 NOTE — ED Notes (Signed)
hospitalist in to assess  

## 2016-11-04 NOTE — ED Notes (Signed)
Pt c/o abd pain.  Dr. Thurnell Garbe aware.

## 2016-11-04 NOTE — ED Notes (Signed)
Report to Brandi, RN 

## 2016-11-05 DIAGNOSIS — A419 Sepsis, unspecified organism: Principal | ICD-10-CM

## 2016-11-05 LAB — BASIC METABOLIC PANEL
Anion gap: 7 (ref 5–15)
BUN: 11 mg/dL (ref 6–20)
CHLORIDE: 105 mmol/L (ref 101–111)
CO2: 29 mmol/L (ref 22–32)
CREATININE: 0.66 mg/dL (ref 0.44–1.00)
Calcium: 8.6 mg/dL — ABNORMAL LOW (ref 8.9–10.3)
GFR calc non Af Amer: >60 mL/min (ref >60)
Glucose, Bld: 106 mg/dL — ABNORMAL HIGH (ref 65–99)
Potassium: 3.8 mmol/L (ref 3.5–5.1)
Sodium: 141 mmol/L (ref 135–145)

## 2016-11-05 LAB — CBC
HEMATOCRIT: 39.3 % (ref 36.0–46.0)
HEMOGLOBIN: 12.9 g/dL (ref 12.0–15.0)
MCH: 28.4 pg (ref 26.0–34.0)
MCHC: 32.8 g/dL (ref 30.0–36.0)
MCV: 86.6 fL (ref 78.0–100.0)
Platelets: 323 10*3/uL (ref 150–400)
RBC: 4.54 MIL/uL (ref 3.87–5.11)
RDW: 14.6 % (ref 11.5–15.5)
WBC: 18.5 10*3/uL — ABNORMAL HIGH (ref 4.0–10.5)

## 2016-11-05 LAB — GLUCOSE, CAPILLARY
GLUCOSE-CAPILLARY: 99 mg/dL (ref 65–99)
Glucose-Capillary: 106 mg/dL — ABNORMAL HIGH (ref 65–99)
Glucose-Capillary: 105 mg/dL — ABNORMAL HIGH (ref 65–99)
Glucose-Capillary: 106 mg/dL — ABNORMAL HIGH (ref 65–99)

## 2016-11-05 MED ORDER — DICYCLOMINE HCL 10 MG PO CAPS
20.0000 mg | ORAL_CAPSULE | ORAL | Status: DC | PRN
  Administered 2016-11-05 – 2016-11-06 (×3): 20 mg via ORAL
  Filled 2016-11-05 (×3): qty 2

## 2016-11-05 MED ORDER — ALUM & MAG HYDROXIDE-SIMETH 200-200-20 MG/5ML PO SUSP
30.0000 mL | ORAL | Status: DC | PRN
  Administered 2016-11-05 – 2016-11-06 (×3): 30 mL via ORAL
  Filled 2016-11-05 (×3): qty 30

## 2016-11-05 NOTE — Progress Notes (Signed)
PROGRESS NOTE    Kristin Bailey  [id number] DOB: Sep 27, 1961 DOA: 11/04/2016 PCP: Rory Percy, MD    Brief Narrative:  55 y.o. female with a history of DM2, HTN, GERD, IBS, thyroid disease. Patient has had 3 hospitalizations for enteritis over the past year. These episodes occur suddenly. Patient had symptoms that started at 1 PM. She had abdominal pain that was in the upper quadrants bilaterally   Assessment & Plan:   Principal Problem:   Sepsis ([place]) - Source GI - Continue current antibiotic regimen - sepsis physiology resolving.  Active Problems:   Controlled type 2 diabetes mellitus without complication, without long-term current use of insulin (HCC) - glipizide and SSI    Enteritis - continue current antibiotic regimen - advance diet to soft diet    Lactic acidosis - resolving  Hypokalemia - resolved   DVT prophylaxis: SCD's Code Status: Full Family Communication: None at bedside Disposition Plan: Pending improvement in condition   Consultants:   None   Procedures: None   Antimicrobials: Zosyn   Subjective: Pt has no new complaints. No acute issues overnight  Objective: Vitals:   11/04/16 1930 11/04/16 1943 11/04/16 2021 11/05/16 0530  BP: 108/63 112/65 114/65 121/67  Pulse: 85 90 84 77  Resp: 20 20 20 18   Temp:  98.2 F (36.8 C) 98.5 F (36.9 C) 98.4 F (36.9 C)  TempSrc:  Oral Oral Oral  SpO2: 98% 95% 99% 97%  Weight:   121.9 kg (268 lb 11.9 oz)   Height:   5\' 6"  (1.676 m)     Intake/Output Summary (Last 24 hours) at 11/05/16 1209 Last data filed at 11/05/16 1006  Gross per 24 hour  Intake             4515 ml  Output             1350 ml  Net             3165 ml   Filed Weights   11/04/16 1446 11/04/16 2021  Weight: 117.9 kg (260 lb) 121.9 kg (268 lb 11.9 oz)    Examination:  General exam: Appears calm and comfortable, in NAD. Respiratory system: Clear to auscultation. Respiratory effort normal. Cardiovascular system:  S1 & S2 heard, RRR. No JVD, murmurs, rubs, gallops or clicks. No pedal edema. Gastrointestinal system: Abdomen is nondistended, soft, generalized discomfort with deep palpation. No organomegaly or masses felt. Normal bowel sounds heard.  Central nervous system: Alert and oriented. No focal neurological deficits. Extremities: Symmetric 5 x 5 power. Skin: No rashes, lesions or ulcers Psychiatry: Judgement and insight appear normal. Mood & affect appropriate.   Data Reviewed: I have personally reviewed following labs and imaging studies  CBC:  Recent Labs Lab 11/04/16 1523 11/05/16 0552  WBC 30.9* 18.5*  HGB 17.2* 12.9  HCT 51.5* 39.3  MCV 85.7 86.6  PLT 417* [id number]   Basic Metabolic Panel:  Recent Labs Lab 11/04/16 1523 11/05/16 0552  NA 138 141  K 3.4* 3.8  CL 101 105  CO2 24 29  GLUCOSE 275* 106*  BUN 16 11  CREATININE 1.16* 0.66  CALCIUM 9.2 8.6*   GFR: Estimated Creatinine Clearance: 107 mL/min (by C-G formula based on SCr of 0.66 mg/dL). Liver Function Tests:  Recent Labs Lab 11/04/16 1523  AST 28  ALT 30  ALKPHOS 68  BILITOT 0.6  PROT 7.7  ALBUMIN 4.0    Recent Labs Lab 11/04/16 1523  LIPASE 45   No results for  input(s): AMMONIA in the last 168 hours. Coagulation Profile: No results for input(s): INR, PROTIME in the last 168 hours. Cardiac Enzymes: No results for input(s): CKTOTAL, CKMB, CKMBINDEX, TROPONINI in the last 168 hours. BNP (last 3 results) No results for input(s): PROBNP in the last 8760 hours. HbA1C: No results for input(s): HGBA1C in the last 72 hours. CBG:  Recent Labs Lab 11/04/16 2111 11/05/16 0720 11/05/16 1121  GLUCAP 134* 106* 99   Lipid Profile: No results for input(s): CHOL, HDL, LDLCALC, TRIG, CHOLHDL, LDLDIRECT in the last 72 hours. Thyroid Function Tests: No results for input(s): TSH, T4TOTAL, FREET4, T3FREE, THYROIDAB in the last 72 hours. Anemia Panel: No results for input(s): VITAMINB12, FOLATE, FERRITIN,  TIBC, IRON, RETICCTPCT in the last 72 hours. Sepsis Labs:  Recent Labs Lab 11/04/16 1524 11/04/16 1858  LATICACIDVEN 4.1* 2.3*    Recent Results (from the past 240 hour(s))  Blood Culture (routine x 2)     Status: None (Preliminary result)   Collection Time: 11/04/16  4:46 PM  Result Value Ref Range Status   Specimen Description BLOOD RIGHT FOREARM  Final   Special Requests BOTTLES DRAWN AEROBIC AND ANAEROBIC 6CC EACH  Final   Culture NO GROWTH < 24 HOURS  Final   Report Status PENDING  Incomplete  Blood Culture (routine x 2)     Status: None (Preliminary result)   Collection Time: 11/04/16  4:46 PM  Result Value Ref Range Status   Specimen Description LEFT ANTECUBITAL  Final   Special Requests BOTTLES DRAWN AEROBIC AND ANAEROBIC 4CC EACH  Final   Culture NO GROWTH < 24 HOURS  Final   Report Status PENDING  Incomplete         Radiology Studies: Ct Abdomen Pelvis W Contrast  Result Date: 11/04/2016 CLINICAL DATA:  Nausea, vomiting, diarrhea, abdominal pain, history hypertension, diabetes mellitus, GERD, diverticulitis, irritable bowel syndrome, cholecystectomy EXAM: CT ABDOMEN AND PELVIS WITH CONTRAST TECHNIQUE: Multidetector CT imaging of the abdomen and pelvis was performed using the standard protocol following bolus administration of intravenous contrast. Sagittal and coronal MPR images reconstructed from axial data set. CONTRAST:  33mL ISOVUE-300 IOPAMIDOL (ISOVUE-300) INJECTION 61%, 131mL ISOVUE-300 IOPAMIDOL (ISOVUE-300) INJECTION 61% COMPARISON:  03/17/2016 FINDINGS: Lower chest: Lung bases clear. Hepatobiliary: Gallbladder surgically absent. Fatty infiltration of liver. No focal hepatic lesions. Pancreas: Normal appearance Spleen: Normal appearance Adrenals/Urinary Tract: Adrenal glands normal appearance. Kidneys, ureters, and bladder normal appearance. Stomach/Bowel: Normal appendix. Stomach unremarkable. Scattered wall thickening of the descending and sigmoid colon as well  as of multiple mid abdominal small bowel loops. Edema throughout small bowel mesenteries. Findings are consistent with a diffuse enteritis. Scattered diverticula of the descending and sigmoid colon without focal pericolic inflammatory changes to suggest acute diverticulitis. Vascular/Lymphatic: Aorta normal caliber. No adenopathy. Few scattered tiny nodes within small bowel mesenteric. Reproductive: Unremarkable uterus and adnexa. Other: Dependent low-attenuation free pelvic fluid. Additional minimal fluid interloop. No free air or hernia. Musculoskeletal: Osseous structures unremarkable. IMPRESSION: Scattered wall thickening of small bowel loops and distal half of colon associated with small bowel mesenteric edema consistent with enteritis ; differential diagnosis includes infection and inflammatory bowel disease. Nonspecific free pelvic fluid. Distal colonic diverticulosis without focal evidence of diverticulitis. Post cholecystectomy. Fatty infiltration of liver. Electronically Signed   By: Lavonia Dana M.D.   On: 11/04/2016 17:42   Dg Chest Port 1 View  Result Date: 11/04/2016 CLINICAL DATA:  Nausea, vomiting, and diarrhea associated with abdominal pain beginning today. History of diabetes and irritable bowel  disease. EXAM: PORTABLE CHEST 1 VIEW COMPARISON:  Chest x-ray of November 30, 2013 and chest CT scan of November 27, 2015. FINDINGS: The lungs are well-expanded. There is no focal infiltrate. There is no pleural effusion or pneumothorax. The heart and pulmonary vascularity are normal. The mediastinum is normal in width. There is no pleural effusion. The bony thorax exhibits no acute abnormality. IMPRESSION: Mild hyperinflation may be voluntary or may reflect underlying reactive airway disease or COPD. There is no pneumonia, CHF, nor other acute cardiopulmonary abnormality. Electronically Signed   By: David  Martinique M.D.   On: 11/04/2016 15:34        Scheduled Meds: . aspirin EC  81 mg Oral Daily    . [START ON 11/07/2016] Dulaglutide  0.5 mL Subcutaneous Q Mon  . DULoxetine  60 mg Oral Daily  . glipiZIDE  5 mg Oral BID AC  . lisinopril  20 mg Oral Daily   And  . hydrochlorothiazide  12.5 mg Oral Daily  . insulin aspart  0-15 Units Subcutaneous TID WC  . insulin aspart  0-5 Units Subcutaneous QHS  . levothyroxine  75 mcg Oral QAC breakfast  . loratadine  10 mg Oral Daily  . pantoprazole  40 mg Oral Daily  . piperacillin-tazobactam (ZOSYN)  IV  3.375 g Intravenous Q8H  . sodium chloride  1,000 mL Intravenous Once   Continuous Infusions: . sodium chloride 125 mL/hr at 11/05/16 0739     LOS: 1 day    Time spent: > 35 minutes    Velvet Bathe, MD [facility] Pager [phone]  If 7PM-7AM, please contact night-coverage [email or web address] Password [facility] 11/05/2016, 12:09 PM

## 2016-11-06 DIAGNOSIS — K529 Noninfective gastroenteritis and colitis, unspecified: Secondary | ICD-10-CM

## 2016-11-06 LAB — CBC
HCT: 35.6 % — ABNORMAL LOW (ref 36.0–46.0)
HEMOGLOBIN: 11.6 g/dL — AB (ref 12.0–15.0)
MCH: 28.6 pg (ref 26.0–34.0)
MCHC: 32.6 g/dL (ref 30.0–36.0)
MCV: 87.7 fL (ref 78.0–100.0)
Platelets: 272 10*3/uL (ref 150–400)
RBC: 4.06 MIL/uL (ref 3.87–5.11)
RDW: 14.6 % (ref 11.5–15.5)
WBC: 12.3 10*3/uL — AB (ref 4.0–10.5)

## 2016-11-06 LAB — GLUCOSE, CAPILLARY
GLUCOSE-CAPILLARY: 108 mg/dL — AB (ref 65–99)
Glucose-Capillary: 89 mg/dL (ref 65–99)

## 2016-11-06 LAB — URINE CULTURE: Culture: <10000 — AB

## 2016-11-06 MED ORDER — CIPROFLOXACIN HCL 500 MG PO TABS: 500.0000 mg | ORAL_TABLET | Freq: Two times a day (BID) | ORAL | 0 refills | Status: AC

## 2016-11-06 MED ORDER — ACETAMINOPHEN 325 MG PO TABS: 650.0000 mg | ORAL_TABLET | Freq: Four times a day (QID) | ORAL | Status: DC | PRN

## 2016-11-06 MED ORDER — METRONIDAZOLE 500 MG PO TABS: 500.0000 mg | ORAL_TABLET | Freq: Three times a day (TID) | ORAL | 0 refills | Status: AC

## 2016-11-06 NOTE — Discharge Summary (Addendum)
Physician Discharge Summary  Kristin Bailey [id number] DOB: 10/04/1961 DOA: 11/04/2016  PCP: Rory Percy, MD  Admit date: 11/04/2016 Discharge date: 11/06/2016  Time spent: > 35 minutes  Recommendations for Outpatient Follow-up:  1. Reassess white blood cell count   Discharge Diagnoses:  Principal Problem:   Sepsis ([place]) Active Problems:   Controlled type 2 diabetes mellitus without complication, without long-term current use of insulin (HCC)   Enteritis   Lactic acidosis   Discharge Condition: Stable  Diet recommendation: Carb modified diet  Filed Weights   11/04/16 1446 11/04/16 2021  Weight: 117.9 kg (260 lb) 121.9 kg (268 lb 11.9 oz)    History of present illness:   55 y.o. female with a history of DM2, HTN, GERD, IBS, thyroid disease. Patient has had 3 hospitalizations for enteritis over the past year. Presented with enteritis  Hospital Course:  Enteritis - We'll plan on discharging with 9 more days of oral antibiotics Cipro and Flagyl.  Addendum: Patient presented with sepsis secondary to GI source. Sepsis physiology resolved with antibiotic therapy.  For other known medical conditions listed above continue medication regimen listed below  Procedures:  CT scan of abdomen  Consultations:  None  Discharge Exam: Vitals:   11/05/16 2109 11/06/16 0605  BP: 113/66 137/75  Pulse: 73 70  Resp: 18 18  Temp: 98.2 F (36.8 C) 97.6 F (36.4 C)    General: Patient in no acute distress, alert and awake Cardiovascular: Regular rate and rhythm, no murmurs or rubs Respiratory: No increased work of breathing, no wheezes Abdomen: Soft, nontender, nondistended, obese  Discharge Instructions   Discharge Instructions    Call MD for:  redness, tenderness, or signs of infection (pain, swelling, redness, odor or green/yellow discharge around incision site)    Complete by:  As directed    Call MD for:  temperature >100.4    Complete by:  As directed    Diet  - low sodium heart healthy    Complete by:  As directed    Discharge instructions    Complete by:  As directed    Please be sure to follow-up with your primary care physician in the next 1-2 weeks or sooner should any new concerns arise.   Increase activity slowly    Complete by:  As directed      Current Discharge Medication List    START taking these medications   Details  acetaminophen (TYLENOL) 325 MG tablet Take 2 tablets (650 mg total) by mouth every 6 (six) hours as needed for mild pain (or Fever >/= 101).    ciprofloxacin (CIPRO) 500 MG tablet Take 1 tablet (500 mg total) by mouth 2 (two) times daily. Qty: 18 tablet, Refills: 0    metroNIDAZOLE (FLAGYL) 500 MG tablet Take 1 tablet (500 mg total) by mouth 3 (three) times daily. Qty: 27 tablet, Refills: 0      CONTINUE these medications which have NOT CHANGED   Details  albuterol (PROVENTIL HFA;VENTOLIN HFA) 108 (90 BASE) MCG/ACT inhaler Inhale 2 puffs into the lungs every 4 (four) hours as needed for wheezing or shortness of breath.    ALPRAZolam (XANAX) 0.25 MG tablet Take 0.25 mg by mouth 2 (two) times daily as needed for anxiety.    aspirin EC 81 MG tablet Take 81 mg by mouth daily.    dicyclomine (BENTYL) 10 MG capsule Take 1 capsule (10 mg total) by mouth 3 (three) times daily before meals. Qty: 90 capsule, Refills: 5  Dulaglutide (TRULICITY) A999333 0000000 SOPN Inject 0.5 mLs into the skin every Monday. On  Monday's.     DULoxetine (CYMBALTA) 60 MG capsule Take 60 mg by mouth daily.    glipiZIDE (GLUCOTROL) 5 MG tablet Take 5 mg by mouth 2 (two) times daily.     levothyroxine (SYNTHROID, LEVOTHROID) 75 MCG tablet Take 75 mcg by mouth daily before breakfast.    lisinopril-hydrochlorothiazide (PRINZIDE,ZESTORETIC) 20-12.5 MG per tablet Take 1 tablet by mouth daily.    loratadine (CLARITIN) 10 MG tablet Take 10 mg by mouth daily.    Multiple Vitamin (MULTIVITAMIN WITH MINERALS) TABS tablet Take 1 tablet by  mouth daily.    omeprazole (PRILOSEC OTC) 20 MG tablet Take 1 tablet (20 mg total) by mouth daily. Qty: 20 tablet, Refills: 0      STOP taking these medications     acetaminophen (TYLENOL) 650 MG CR tablet        No Known Allergies    The results of significant diagnostics from this hospitalization (including imaging, microbiology, ancillary and laboratory) are listed below for reference.    Significant Diagnostic Studies: Ct Abdomen Pelvis W Contrast  Result Date: 11/04/2016 CLINICAL DATA:  Nausea, vomiting, diarrhea, abdominal pain, history hypertension, diabetes mellitus, GERD, diverticulitis, irritable bowel syndrome, cholecystectomy EXAM: CT ABDOMEN AND PELVIS WITH CONTRAST TECHNIQUE: Multidetector CT imaging of the abdomen and pelvis was performed using the standard protocol following bolus administration of intravenous contrast. Sagittal and coronal MPR images reconstructed from axial data set. CONTRAST:  26mL ISOVUE-300 IOPAMIDOL (ISOVUE-300) INJECTION 61%, 161mL ISOVUE-300 IOPAMIDOL (ISOVUE-300) INJECTION 61% COMPARISON:  03/17/2016 FINDINGS: Lower chest: Lung bases clear. Hepatobiliary: Gallbladder surgically absent. Fatty infiltration of liver. No focal hepatic lesions. Pancreas: Normal appearance Spleen: Normal appearance Adrenals/Urinary Tract: Adrenal glands normal appearance. Kidneys, ureters, and bladder normal appearance. Stomach/Bowel: Normal appendix. Stomach unremarkable. Scattered wall thickening of the descending and sigmoid colon as well as of multiple mid abdominal small bowel loops. Edema throughout small bowel mesenteries. Findings are consistent with a diffuse enteritis. Scattered diverticula of the descending and sigmoid colon without focal pericolic inflammatory changes to suggest acute diverticulitis. Vascular/Lymphatic: Aorta normal caliber. No adenopathy. Few scattered tiny nodes within small bowel mesenteric. Reproductive: Unremarkable uterus and adnexa. Other:  Dependent low-attenuation free pelvic fluid. Additional minimal fluid interloop. No free air or hernia. Musculoskeletal: Osseous structures unremarkable. IMPRESSION: Scattered wall thickening of small bowel loops and distal half of colon associated with small bowel mesenteric edema consistent with enteritis ; differential diagnosis includes infection and inflammatory bowel disease. Nonspecific free pelvic fluid. Distal colonic diverticulosis without focal evidence of diverticulitis. Post cholecystectomy. Fatty infiltration of liver. Electronically Signed   By: Lavonia Dana M.D.   On: 11/04/2016 17:42   Dg Chest Port 1 View  Result Date: 11/04/2016 CLINICAL DATA:  Nausea, vomiting, and diarrhea associated with abdominal pain beginning today. History of diabetes and irritable bowel disease. EXAM: PORTABLE CHEST 1 VIEW COMPARISON:  Chest x-ray of November 30, 2013 and chest CT scan of November 27, 2015. FINDINGS: The lungs are well-expanded. There is no focal infiltrate. There is no pleural effusion or pneumothorax. The heart and pulmonary vascularity are normal. The mediastinum is normal in width. There is no pleural effusion. The bony thorax exhibits no acute abnormality. IMPRESSION: Mild hyperinflation may be voluntary or may reflect underlying reactive airway disease or COPD. There is no pneumonia, CHF, nor other acute cardiopulmonary abnormality. Electronically Signed   By: David  Martinique M.D.   On: 11/04/2016 15:34  Microbiology: Recent Results (from the past 240 hour(s))  Blood Culture (routine x 2)     Status: None (Preliminary result)   Collection Time: 11/04/16  4:46 PM  Result Value Ref Range Status   Specimen Description BLOOD RIGHT FOREARM  Final   Special Requests BOTTLES DRAWN AEROBIC AND ANAEROBIC [place]  Final   Culture NO GROWTH 2 DAYS  Final   Report Status PENDING  Incomplete  Blood Culture (routine x 2)     Status: None (Preliminary result)   Collection Time: 11/04/16  4:46 PM   Result Value Ref Range Status   Specimen Description LEFT ANTECUBITAL  Final   Special Requests BOTTLES DRAWN AEROBIC AND ANAEROBIC 4CC EACH  Final   Culture NO GROWTH 2 DAYS  Final   Report Status PENDING  Incomplete  Urine culture     Status: Abnormal   Collection Time: 11/04/16  4:51 PM  Result Value Ref Range Status   Specimen Description URINE, CLEAN CATCH  Final   Special Requests NONE  Final   Culture (A)  Final    <10,000 COLONIES/mL INSIGNIFICANT GROWTH Performed at [facility]    Report Status 11/06/2016 FINAL  Final     Labs: Basic Metabolic Panel:  Recent Labs Lab 11/04/16 1523 11/05/16 0552  NA 138 141  K 3.4* 3.8  CL 101 105  CO2 24 29  GLUCOSE 275* 106*  BUN 16 11  CREATININE 1.16* 0.66  CALCIUM 9.2 8.6*   Liver Function Tests:  Recent Labs Lab 11/04/16 1523  AST 28  ALT 30  ALKPHOS 68  BILITOT 0.6  PROT 7.7  ALBUMIN 4.0    Recent Labs Lab 11/04/16 1523  LIPASE 45   No results for input(s): AMMONIA in the last 168 hours. CBC:  Recent Labs Lab 11/04/16 1523 11/05/16 0552 11/06/16 0549  WBC 30.9* 18.5* 12.3*  HGB 17.2* 12.9 11.6*  HCT 51.5* 39.3 35.6*  MCV 85.7 86.6 87.7  PLT 417* 323 272   Cardiac Enzymes: No results for input(s): CKTOTAL, CKMB, CKMBINDEX, TROPONINI in the last 168 hours. BNP: BNP (last 3 results) No results for input(s): BNP in the last 8760 hours.  ProBNP (last 3 results) No results for input(s): PROBNP in the last 8760 hours.  CBG:  Recent Labs Lab 11/05/16 1121 11/05/16 1609 11/05/16 2106 11/06/16 0726 11/06/16 1127  GLUCAP 99 105* 106* 108* 89    Signed:  Velvet Bathe MD.  [facility] 11/06/2016, 12:51 PM

## 2016-11-06 NOTE — Progress Notes (Signed)
Pt discharged home today per Dr. Wendee Beavers.  Pt's IV site D/C'd and WDL.  Pt's VSS.  Pt provided with home medication list, discharge instructions and prescriptions.  Verbalized understanding.  Pt left floor via WC in stable condition accompanied by NT.

## 2016-11-09 LAB — CULTURE, BLOOD (ROUTINE X 2)
CULTURE: NO GROWTH
Culture: NO GROWTH

## 2016-11-16 ENCOUNTER — Encounter (INDEPENDENT_AMBULATORY_CARE_PROVIDER_SITE_OTHER): Payer: Self-pay | Admitting: Internal Medicine

## 2016-11-16 ENCOUNTER — Other Ambulatory Visit (INDEPENDENT_AMBULATORY_CARE_PROVIDER_SITE_OTHER): Payer: Self-pay | Admitting: Internal Medicine

## 2016-11-16 ENCOUNTER — Ambulatory Visit (INDEPENDENT_AMBULATORY_CARE_PROVIDER_SITE_OTHER): Payer: Self-pay | Admitting: Internal Medicine

## 2016-11-16 VITALS — BP 144/66 | HR 72 | Temp 98.1°F | Ht 66.0 in | Wt 259.2 lb

## 2016-11-16 DIAGNOSIS — K529 Noninfective gastroenteritis and colitis, unspecified: Secondary | ICD-10-CM

## 2016-11-16 MED ORDER — ONDANSETRON HCL 4 MG PO TABS: 4.0000 mg | ORAL_TABLET | Freq: Three times a day (TID) | ORAL | 1 refills | Status: DC | PRN

## 2016-11-16 NOTE — Progress Notes (Signed)
Subjective:    Patient ID: Kristin Bailey, female    DOB: [birth year], 56 y.o.   MRN: [id number]  HPI Recently seen in the ED with Nausea, vomiting, and diarrhea. She was having multiple stools. She was discharge with Cipro and Flagyl.  Several admission for same in the past. Her last colonoscopy was in 2015 which was normal except for diverticulosis. She tells me she does not feel any better, but then she says she is 70% better.  She says she will have stomach cramps during these episodes. She will have 4-5 normal BMs. She will take a Xanax to calm down. Eventually she will have diarrhea, vomiting and  Becomes clammy. She was seen in the ED and given IV fluids and admitted. She says she has nausea. She feels okay when she is lying.  There was no fever associated with her symptoms episode.  Hx of elevated WBC ct. Has seen hematologist in the past for this.   She now has nausea and stomach cramps at times now.   03/29/2016 EGD: epigastric pain, abdominal pain, nausea and vomiting. Impression:               - Normal esophagus.                           - Z-line irregular, 42 cm from the incisors.                           - A few gastric polyps. Biopsied.                           - Normal duodenal bulb and second portion of the                            duodenum. Biopsied.                           - Normal hypopharynx. Celiac panel negative.  11/04/2016 CT abdomen/pelvis with CM: N,V, diarrhea, abdominal pain.  IMPRESSION: Scattered wall thickening of small bowel loops and distal half of colon associated with small bowel mesenteric edema consistent with enteritis ; differential diagnosis includes infection and inflammatory bowel disease. Nonspecific free pelvic fluid. Distal colonic diverticulosis without focal evidence of diverticulitis. Post cholecystectomy. Fatty infiltration of liver.  05/13/2016 Emptying study normal.   CBC    Component Value Date/Time   WBC 12.3  (H) 11/06/2016 0549   RBC 4.06 11/06/2016 0549   HGB 11.6 (L) 11/06/2016 0549   HCT 35.6 (L) 11/06/2016 0549   PLT 272 11/06/2016 0549   MCV 87.7 11/06/2016 0549   MCH 28.6 11/06/2016 0549   MCHC 32.6 11/06/2016 0549   RDW 14.6 11/06/2016 0549   LYMPHSABS 3.6 06/13/2016 0505   MONOABS 1.1 (H) 06/13/2016 0505   EOSABS 0.2 06/13/2016 0505   BASOSABS 0.0 06/13/2016 0505     08/28/2014 Colonoscopy: 2 episodes of hematochezia.    Family history significant for colon carcinoma in her mother but she was in her early 22s at the time of diagnosis. Impression:  Examination performed to cecum. Moderate sigmoid colon diverticulosis and external hemorrhoids. No evidence of colonic polyps.     Review of Systems Past Medical History:  Diagnosis Date  . Depression   . Diabetes mellitus without  complication ([place])   . Diverticulitis   . GERD (gastroesophageal reflux disease)   . Hypertension   . IBS (irritable bowel syndrome)   . Pneumonia   . Thyroid disease     Past Surgical History:  Procedure Laterality Date  . CARPAL TUNNEL RELEASE    . CESAREAN SECTION    . CHOLECYSTECTOMY    . COLONOSCOPY N/A 08/28/2014   Procedure: COLONOSCOPY;  Surgeon: Rogene Houston, MD;  Location: [facility];  Service: Endoscopy;  Laterality: N/A;  1030  . CYST REMOVAL HAND    . ESOPHAGOGASTRODUODENOSCOPY N/A 03/19/2016   Procedure: ESOPHAGOGASTRODUODENOSCOPY (EGD);  Surgeon: Rogene Houston, MD;  Location: [facility];  Service: Endoscopy;  Laterality: N/A;  . FRACTURE SURGERY     rt ankle   . KNEE SURGERY     meniscus repair  . TUBAL LIGATION      No Known Allergies  Current Outpatient Prescriptions on File Prior to Visit  Medication Sig Dispense Refill  . acetaminophen (TYLENOL) 325 MG tablet Take 2 tablets (650 mg total) by mouth every 6 (six) hours as needed for mild pain (or Fever >/= 101).    Marland Kitchen albuterol (PROVENTIL HFA;VENTOLIN HFA) 108 (90 BASE) MCG/ACT inhaler Inhale 2 puffs into the  lungs every 4 (four) hours as needed for wheezing or shortness of breath.    . ALPRAZolam (XANAX) 0.25 MG tablet Take 0.25 mg by mouth 2 (two) times daily as needed for anxiety.    Marland Kitchen aspirin EC 81 MG tablet Take 81 mg by mouth daily.    Marland Kitchen dicyclomine (BENTYL) 10 MG capsule Take 1 capsule (10 mg total) by mouth 3 (three) times daily before meals. 90 capsule 5  . Dulaglutide (TRULICITY) A999333 0000000 SOPN Inject 0.5 mLs into the skin every Monday. On  Monday's.     . DULoxetine (CYMBALTA) 60 MG capsule Take 60 mg by mouth daily.    Marland Kitchen glipiZIDE (GLUCOTROL) 5 MG tablet Take 5 mg by mouth 2 (two) times daily.     Marland Kitchen levothyroxine (SYNTHROID, LEVOTHROID) 75 MCG tablet Take 75 mcg by mouth daily before breakfast.    . lisinopril-hydrochlorothiazide (PRINZIDE,ZESTORETIC) 20-12.5 MG per tablet Take 1 tablet by mouth daily.    Marland Kitchen loratadine (CLARITIN) 10 MG tablet Take 10 mg by mouth daily.    . Multiple Vitamin (MULTIVITAMIN WITH MINERALS) TABS tablet Take 1 tablet by mouth daily.    Marland Kitchen omeprazole (PRILOSEC OTC) 20 MG tablet Take 1 tablet (20 mg total) by mouth daily. 20 tablet 0   No current facility-administered medications on file prior to visit.        Objective:   Physical Exam Blood pressure (!) 144/66, pulse 72, temperature 98.1 F (36.7 C), height 5\' 6"  (1.676 m), weight 259 lb 3.2 oz (117.6 kg). Alert and oriented. Skin warm and dry. Oral mucosa is moist.   . Sclera anicteric, conjunctivae is pink. Thyroid not enlarged. No cervical lymphadenopathy. Lungs clear. Heart regular rate and rhythm.  Abdomen is soft. Bowel sounds are positive. No hepatomegaly. No abdominal masses felt. Slight tenderness to abdomen. Obese. No edema to lower extremities.          Assessment & Plan:  Possible enteritis.  GI pathogen. Will discuss with Dr.Rehman.  Rx for Zofran called to her pharmacy I discussed with Dr. Laural Golden. Agile Capsule study then Capsule study.

## 2016-11-16 NOTE — Patient Instructions (Signed)
GI pathogen. Will discuss with Dr. Laural Golden

## 2016-11-17 ENCOUNTER — Telehealth (INDEPENDENT_AMBULATORY_CARE_PROVIDER_SITE_OTHER): Payer: Self-pay | Admitting: Internal Medicine

## 2016-11-17 ENCOUNTER — Other Ambulatory Visit (INDEPENDENT_AMBULATORY_CARE_PROVIDER_SITE_OTHER): Payer: Self-pay | Admitting: Internal Medicine

## 2016-11-17 DIAGNOSIS — K529 Noninfective gastroenteritis and colitis, unspecified: Secondary | ICD-10-CM

## 2016-11-17 NOTE — Telephone Encounter (Signed)
Ann, Agile capsule study

## 2016-11-17 NOTE — Telephone Encounter (Signed)
Agile capsule sch'd 11/23/16 at 830 (800), patient aware, instructions given

## 2016-11-23 ENCOUNTER — Encounter (HOSPITAL_COMMUNITY): Payer: Self-pay | Admitting: *Deleted

## 2016-11-23 ENCOUNTER — Ambulatory Visit (HOSPITAL_COMMUNITY)
Admission: RE | Admit: 2016-11-23 | Discharge: 2016-11-23 | Disposition: A | Discharge status: Home / Self Care | Payer: Self-pay | Source: Ambulatory Visit | Attending: Internal Medicine | Admitting: Internal Medicine

## 2016-11-23 ENCOUNTER — Encounter (HOSPITAL_COMMUNITY)
Admission: RE | Disposition: A | Discharge status: Home / Self Care | Payer: Self-pay | Source: Ambulatory Visit | Attending: Internal Medicine

## 2016-11-23 DIAGNOSIS — E119 Type 2 diabetes mellitus without complications: Secondary | ICD-10-CM | POA: Insufficient documentation

## 2016-11-23 DIAGNOSIS — K529 Noninfective gastroenteritis and colitis, unspecified: Secondary | ICD-10-CM | POA: Insufficient documentation

## 2016-11-23 DIAGNOSIS — K219 Gastro-esophageal reflux disease without esophagitis: Secondary | ICD-10-CM | POA: Insufficient documentation

## 2016-11-23 DIAGNOSIS — Z7982 Long term (current) use of aspirin: Secondary | ICD-10-CM | POA: Insufficient documentation

## 2016-11-23 DIAGNOSIS — Z79899 Other long term (current) drug therapy: Secondary | ICD-10-CM | POA: Insufficient documentation

## 2016-11-23 DIAGNOSIS — E079 Disorder of thyroid, unspecified: Secondary | ICD-10-CM | POA: Insufficient documentation

## 2016-11-23 DIAGNOSIS — I10 Essential (primary) hypertension: Secondary | ICD-10-CM | POA: Insufficient documentation

## 2016-11-23 DIAGNOSIS — Z7984 Long term (current) use of oral hypoglycemic drugs: Secondary | ICD-10-CM | POA: Insufficient documentation

## 2016-11-23 HISTORY — PX: AGILE CAPSULE: SHX5420

## 2016-11-23 SURGERY — AGILE CAPSULE

## 2016-11-23 MED ORDER — SODIUM CHLORIDE 0.9 % IV SOLN: INTRAVENOUS | Status: DC

## 2016-11-24 ENCOUNTER — Other Ambulatory Visit (INDEPENDENT_AMBULATORY_CARE_PROVIDER_SITE_OTHER): Payer: Self-pay | Admitting: Internal Medicine

## 2016-11-24 ENCOUNTER — Other Ambulatory Visit (INDEPENDENT_AMBULATORY_CARE_PROVIDER_SITE_OTHER): Payer: Self-pay | Admitting: *Deleted

## 2016-11-24 ENCOUNTER — Ambulatory Visit (HOSPITAL_COMMUNITY)
Admission: RE | Admit: 2016-11-24 | Discharge: 2016-11-24 | Disposition: A | Discharge status: Home / Self Care | Payer: Self-pay | Source: Ambulatory Visit | Attending: Internal Medicine | Admitting: Internal Medicine

## 2016-11-24 DIAGNOSIS — T189XXA Foreign body of alimentary tract, part unspecified, initial encounter: Secondary | ICD-10-CM

## 2016-11-24 DIAGNOSIS — X58XXXA Exposure to other specified factors, initial encounter: Secondary | ICD-10-CM | POA: Insufficient documentation

## 2016-11-25 ENCOUNTER — Telehealth (INDEPENDENT_AMBULATORY_CARE_PROVIDER_SITE_OTHER): Payer: Self-pay | Admitting: Internal Medicine

## 2016-11-25 ENCOUNTER — Encounter (HOSPITAL_COMMUNITY): Payer: Self-pay | Admitting: Internal Medicine

## 2016-11-25 ENCOUNTER — Other Ambulatory Visit (INDEPENDENT_AMBULATORY_CARE_PROVIDER_SITE_OTHER): Payer: Self-pay | Admitting: Internal Medicine

## 2016-11-25 DIAGNOSIS — K529 Noninfective gastroenteritis and colitis, unspecified: Secondary | ICD-10-CM

## 2016-11-25 NOTE — Telephone Encounter (Signed)
Capsule study

## 2016-11-25 NOTE — Telephone Encounter (Signed)
Ann, Given' capsule

## 2016-11-28 DIAGNOSIS — K529 Noninfective gastroenteritis and colitis, unspecified: Secondary | ICD-10-CM | POA: Insufficient documentation

## 2016-11-28 NOTE — Telephone Encounter (Signed)
GIVENS capsule sch'd 12/01/16 at 730 (700), patient aware, verbal instructions given

## 2016-12-01 ENCOUNTER — Ambulatory Visit (HOSPITAL_COMMUNITY): Admission: RE | Admit: 2016-12-01 | Payer: Self-pay | Source: Ambulatory Visit | Admitting: Internal Medicine

## 2016-12-01 ENCOUNTER — Encounter (HOSPITAL_COMMUNITY): Admission: RE | Payer: Self-pay | Source: Ambulatory Visit

## 2016-12-01 SURGERY — IMAGING PROCEDURE, GI TRACT, INTRALUMINAL, VIA CAPSULE

## 2016-12-05 ENCOUNTER — Other Ambulatory Visit (INDEPENDENT_AMBULATORY_CARE_PROVIDER_SITE_OTHER): Payer: Self-pay | Admitting: Internal Medicine

## 2016-12-05 DIAGNOSIS — K529 Noninfective gastroenteritis and colitis, unspecified: Secondary | ICD-10-CM

## 2016-12-08 ENCOUNTER — Encounter (HOSPITAL_COMMUNITY): Admission: RE | Payer: Self-pay | Source: Ambulatory Visit

## 2016-12-08 ENCOUNTER — Other Ambulatory Visit (INDEPENDENT_AMBULATORY_CARE_PROVIDER_SITE_OTHER): Payer: Self-pay | Admitting: Internal Medicine

## 2016-12-08 ENCOUNTER — Ambulatory Visit (HOSPITAL_COMMUNITY): Admission: RE | Admit: 2016-12-08 | Payer: Self-pay | Source: Ambulatory Visit | Admitting: Internal Medicine

## 2016-12-08 DIAGNOSIS — K5289 Other specified noninfective gastroenteritis and colitis: Secondary | ICD-10-CM

## 2016-12-08 SURGERY — IMAGING PROCEDURE, GI TRACT, INTRALUMINAL, VIA CAPSULE

## 2016-12-15 ENCOUNTER — Encounter (HOSPITAL_COMMUNITY)
Admission: RE | Disposition: A | Discharge status: Home / Self Care | Payer: Self-pay | Source: Ambulatory Visit | Attending: Internal Medicine

## 2016-12-15 ENCOUNTER — Ambulatory Visit (HOSPITAL_COMMUNITY)
Admission: RE | Admit: 2016-12-15 | Discharge: 2016-12-15 | Disposition: A | Discharge status: Home / Self Care | Payer: Self-pay | Source: Ambulatory Visit | Attending: Internal Medicine | Admitting: Internal Medicine

## 2016-12-15 DIAGNOSIS — Z7984 Long term (current) use of oral hypoglycemic drugs: Secondary | ICD-10-CM | POA: Insufficient documentation

## 2016-12-15 DIAGNOSIS — Z79899 Other long term (current) drug therapy: Secondary | ICD-10-CM | POA: Insufficient documentation

## 2016-12-15 DIAGNOSIS — K529 Noninfective gastroenteritis and colitis, unspecified: Secondary | ICD-10-CM | POA: Insufficient documentation

## 2016-12-15 DIAGNOSIS — K5289 Other specified noninfective gastroenteritis and colitis: Secondary | ICD-10-CM

## 2016-12-15 DIAGNOSIS — Z7982 Long term (current) use of aspirin: Secondary | ICD-10-CM | POA: Insufficient documentation

## 2016-12-15 DIAGNOSIS — K3189 Other diseases of stomach and duodenum: Secondary | ICD-10-CM

## 2016-12-15 DIAGNOSIS — R935 Abnormal findings on diagnostic imaging of other abdominal regions, including retroperitoneum: Secondary | ICD-10-CM | POA: Insufficient documentation

## 2016-12-15 HISTORY — PX: GIVENS CAPSULE STUDY: SHX5432

## 2016-12-15 SURGERY — IMAGING PROCEDURE, GI TRACT, INTRALUMINAL, VIA CAPSULE

## 2016-12-17 NOTE — Op Note (Signed)
Small Bowel Givens Capsule Study Procedure date:  12/15/2016  PCP:  Dr. Rory Percy, MD  Indication for procedure:   Patient is 56 year old Caucasian female who has recurrent episodes of abdominal pain nausea vomiting and diarrhea and has undergone extensive workup without definite diagnosis. She had colonoscopy in October 2015 revealing moderate sigmoid colon diverticulosis and adnexal hemorrhoids but no evidence of colitis. She had EGD on 03/19/2016 revealing 1 and a gland polyps and normal duodenal mucosa. Stool studies last year were negative and she also has been screened for AIP. She had another episode a few weeks ago and she had abdominopelvic CT on 11/04/2016 which revealed scattered wall thickening to glucose of small bowel as well as distal half of the colon with small bowel mesenteric edema. She therefore may have small bowel disease to account for symptoms. Got patency was confirmed with Agile capsule now she is returning for small bowel given capsule study.   Findings:   Patient was able to swallow given capsule without any difficulty. Study duration 7 hours and 57 minutes. Small amount of food debris noted in the stomach.  Small bowel mucosa was normal throughout.  First Gastric image:  27 sec First Duodenal image: 30 min and 18 sec First Ileo-Cecal Valve image: 4 hours, 26 min and 22 sec First Cecal image: 4 hours, 26 min and 59 sec Gastric Passage time: 30 minutes Small Bowel Passage time: 3 hours and 57 min  Summary & Recommendations:  Small amount of food debris in the stomach may suggest gastroparesis at least but this would not explain patient's symptom complex. Normal small bowel study. Next step in evaluation would be diagnostic laparoscopy.  Please note patient has been contacted on three different occasions but there was no answer and message/result has been left on her answering service.

## 2016-12-19 ENCOUNTER — Encounter (HOSPITAL_COMMUNITY): Payer: Self-pay | Admitting: Internal Medicine

## 2016-12-26 ENCOUNTER — Telehealth (INDEPENDENT_AMBULATORY_CARE_PROVIDER_SITE_OTHER): Payer: Self-pay | Admitting: Internal Medicine

## 2016-12-26 NOTE — Telephone Encounter (Signed)
Patient called stated that she had the pill camera done a few weeks ago and weekend before last Dr. Laural Golden tried to call her several times, she wanted to let us know that she tried to have him paged to speak to him, but he wasnt on call.  Per the patient, if he wants to speak to her, shes off today he can call anytime.  [phone]

## 2016-12-27 NOTE — Telephone Encounter (Signed)
Forward to [place].

## 2016-12-29 NOTE — Telephone Encounter (Signed)
Patient call returned and discuss results of small bowel given capsule study.. She will keep symptom diary until office visit with me in 3 months.

## 2017-01-03 ENCOUNTER — Encounter (INDEPENDENT_AMBULATORY_CARE_PROVIDER_SITE_OTHER): Payer: Self-pay | Admitting: Internal Medicine

## 2017-01-03 NOTE — Telephone Encounter (Signed)
Patient was given an appointment with Dr. Laural Golden for 03/21/17 at 1:30pm.  A letter was mailed to the patient.

## 2017-01-11 ENCOUNTER — Encounter (HOSPITAL_COMMUNITY): Payer: Self-pay | Admitting: Internal Medicine

## 2017-02-14 NOTE — Progress Notes (Signed)
Cardiology Office Note   Date:  02/16/2017   ID:  Kristin Bailey, DOB 1960/12/04, MRN [id number]  PCP:  Rory Percy, MD  Cardiologist:   Jenkins Rouge, MD   No chief complaint on file.     History of Present Illness: Kristin Bailey is a 56 y.o. female who presents for evaluation/consultation to r/o CAD. Referred by Dr Nadara Mustard. CRF;s include DM, HTN and family  History. Has GERD, IBS and has had multiple issues with nause vomiting diarhea with negative EGD, capsule study and colonoscopy Sees [facility]. Been Rx with multiple antibiotics including Cipro and flagyl  CT chest showed normal thoracic aorta and no abdominal aneurysm.   A month ago had some left shoulder pain radiating to neck and left arm Lasted about a week  Husband massaging and putting "cream" on it helped. Concern that sister in law had same pain and died of massive MI  Past Medical History:  Diagnosis Date  . Depression   . Diabetes mellitus without complication ([place])   . Diverticulitis   . GERD (gastroesophageal reflux disease)   . Hypertension   . IBS (irritable bowel syndrome)   . Pneumonia   . Thyroid disease     Past Surgical History:  Procedure Laterality Date  . AGILE CAPSULE N/A 11/23/2016   Procedure: AGILE CAPSULE;  Surgeon: Rogene Houston, MD;  [facility];  Service: Endoscopy;  Laterality: N/A;  8:30  . CARPAL TUNNEL RELEASE    . CESAREAN SECTION    . CHOLECYSTECTOMY    . COLONOSCOPY N/A 08/28/2014   Procedure: COLONOSCOPY;  Surgeon: Rogene Houston, MD;  [facility];  Service: Endoscopy;  Laterality: N/A;  1030  . CYST REMOVAL HAND    . ESOPHAGOGASTRODUODENOSCOPY N/A 03/19/2016   Procedure: ESOPHAGOGASTRODUODENOSCOPY (EGD);  Surgeon: Rogene Houston, MD;  [facility];  Service: Endoscopy;  Laterality: N/A;  . FRACTURE SURGERY     rt ankle   . GIVENS CAPSULE STUDY N/A 12/15/2016   Procedure: GIVENS CAPSULE STUDY;  Surgeon: Rogene Houston, MD;  [facility];  Service: Endoscopy;  Laterality: N/A;  . KNEE SURGERY     meniscus repair  . TUBAL LIGATION       Current Outpatient Prescriptions  Medication Sig Dispense Refill  . acetaminophen (TYLENOL) 325 MG tablet Take 2 tablets (650 mg total) by mouth every 6 (six) hours as needed for mild pain (or Fever >/= 101).    Marland Kitchen albuterol (PROVENTIL HFA;VENTOLIN HFA) 108 (90 BASE) MCG/ACT inhaler Inhale 2 puffs into the lungs every 4 (four) hours as needed for wheezing or shortness of breath.    . ALPRAZolam (XANAX) 0.25 MG tablet Take 0.25 mg by mouth 2 (two) times daily as needed for anxiety.    Marland Kitchen aspirin EC 81 MG tablet Take 81 mg by mouth daily.    . Dulaglutide (TRULICITY) 9.02 IO/9.7DZ SOPN Inject 0.5 mLs into the skin every Monday. On  Monday's.     . DULoxetine (CYMBALTA) 60 MG capsule Take 60 mg by mouth daily.    Marland Kitchen glipiZIDE (GLUCOTROL) 5 MG tablet Take 5 mg by mouth 2 (two) times daily.     Marland Kitchen levothyroxine (SYNTHROID, LEVOTHROID) 75 MCG tablet Take 75 mcg by mouth daily before breakfast.    . lisinopril-hydrochlorothiazide (PRINZIDE,ZESTORETIC) 20-12.5 MG per tablet Take 1 tablet by mouth daily.    . Multiple Vitamin (MULTIVITAMIN WITH MINERALS) TABS tablet Take 1 tablet  by mouth daily.    Marland Kitchen omeprazole (PRILOSEC OTC) 20 MG tablet Take 1 tablet (20 mg total) by mouth daily. 20 tablet 0  . ondansetron (ZOFRAN) 4 MG tablet Take 1 tablet (4 mg total) by mouth every 8 (eight) hours as needed for nausea or vomiting. 30 tablet 1   No current facility-administered medications for this visit.     Allergies:   Patient has no known allergies.    Social History:  The patient  reports that she has never smoked. She has never used smokeless tobacco. She reports that she does not drink alcohol or use drugs.   Family History:  The patient's family history includes Cancer in her mother, paternal aunt, and paternal uncle; Diabetes in her father and mother; Hypertension in her father and mother;  Kidney disease in her paternal aunt and paternal uncle; Non-Hodgkin's lymphoma in her father; Stroke in her maternal grandmother and mother.    ROS:  Please see the history of present illness.   Otherwise, review of systems are positive for none.   All other systems are reviewed and negative.    PHYSICAL EXAM: VS:  BP 122/84 (BP Location: Right Arm)   Pulse 90   Ht 5\' 6"  (1.676 m)   Wt 268 lb (121.6 kg)   LMP 03/27/2014 Comment: irregular  SpO2 98%   BMI 43.26 kg/m  , BMI Body mass index is 43.26 kg/m. Affect appropriate Healthy:  appears stated age 56: normal Neck supple with no adenopathy JVP normal no bruits no thyromegaly Lungs clear with no wheezing and good diaphragmatic motion Heart:  S1/S2 no murmur, no rub, gallop or click PMI normal Abdomen: benighn, BS positve, no tenderness, no AAA no bruit.  No HSM or HJR Distal pulses intact with no bruits No edema Neuro non-focal Skin warm and dry No muscular weakness    EKG:   11/05/16 SR rate 93 nonspecific ST/T wave changes    Recent Labs: 11/04/2016: ALT 30 11/05/2016: BUN 11; Creatinine, Ser 0.66; Potassium 3.8; Sodium 141 11/06/2016: Hemoglobin 11.6; Platelets 272    Lipid Panel No results found for: CHOL, TRIG, HDL, CHOLHDL, VLDL, LDLCALC, LDLDIRECT    Wt Readings from Last 3 Encounters:  02/16/17 268 lb (121.6 kg)  12/15/16 260 lb (117.9 kg)  11/16/16 259 lb 3.2 oz (117.6 kg)      Other studies Reviewed: Additional studies/ records that were reviewed today include: Notes Dr Nadara Mustard CT ECG and labs in [place].    ASSESSMENT AND PLAN:  1. Chest Pain atypical multiple risk factors including DM unable to walk on treadmill due to knees f/ u Lexiscan Myovue 2. HTN Well controlled.  Continue current medications and low sodium Dash type diet.   3. BO:FBPZWCHEN low carb diet.  Target hemoglobin A1c is 6.5 or less.  Continue current medications. 4. GI negative w/u symptoms seem to have resolved f/u  [facility]   Current medicines are reviewed at length with the patient today.  The patient does not have concerns regarding medicines.  The following changes have been made:  no change  Labs/ tests ordered today include: Lexiscan Myovue  No orders of the defined types were placed in this encounter.    Disposition:   FU with [place] PRN      Signed, Jenkins Rouge, MD  02/16/2017 8:36 AM    [facility] [address], [place], [place]  [address] Phone: [phone]; Fax: [phone]

## 2017-02-16 ENCOUNTER — Encounter: Payer: Self-pay | Admitting: Cardiovascular Disease

## 2017-02-16 ENCOUNTER — Ambulatory Visit (INDEPENDENT_AMBULATORY_CARE_PROVIDER_SITE_OTHER): Payer: BLUE CROSS/BLUE SHIELD | Admitting: Cardiovascular Disease

## 2017-02-16 VITALS — BP 122/84 | HR 90 | Ht 66.0 in | Wt 268.0 lb

## 2017-02-16 DIAGNOSIS — R072 Precordial pain: Secondary | ICD-10-CM | POA: Diagnosis not present

## 2017-02-16 NOTE — Patient Instructions (Signed)
Your physician recommends that you schedule a follow-up appointment in:  To be determined after test,we will call you with results     Your physician has requested that you have a lexiscan myoview. For further information please visit [email or web address]. Please follow instruction sheet, as given.       Thank you for choosing [facility] !

## 2017-02-24 ENCOUNTER — Encounter (HOSPITAL_COMMUNITY): Payer: Self-pay

## 2017-02-24 ENCOUNTER — Inpatient Hospital Stay (HOSPITAL_COMMUNITY): Admission: RE | Admit: 2017-02-24 | Payer: BLUE CROSS/BLUE SHIELD | Source: Ambulatory Visit

## 2017-02-24 ENCOUNTER — Encounter (HOSPITAL_COMMUNITY)
Admission: RE | Admit: 2017-02-24 | Discharge: 2017-02-24 | Disposition: A | Discharge status: Home / Self Care | Payer: BLUE CROSS/BLUE SHIELD | Source: Ambulatory Visit | Attending: Cardiovascular Disease | Admitting: Cardiovascular Disease

## 2017-02-24 DIAGNOSIS — R072 Precordial pain: Secondary | ICD-10-CM

## 2017-02-24 LAB — NM MYOCAR MULTI W/SPECT W/WALL MOTION / EF
CHL CUP NUCLEAR SSS: 5
CSEPPHR: 99 {beats}/min
LV sys vol: 11 mL
LVDIAVOL: 46 mL (ref 46–106)
NUC STRESS TID: 1.2
RATE: 0.41
Rest HR: 86 {beats}/min
SDS: 0
SRS: 5

## 2017-02-24 MED ORDER — TECHNETIUM TC 99M TETROFOSMIN IV KIT
10.0000 | PACK | Freq: Once | INTRAVENOUS | Status: AC | PRN
  Administered 2017-02-24: 10.3 via INTRAVENOUS

## 2017-02-24 MED ORDER — SODIUM CHLORIDE 0.9% FLUSH
INTRAVENOUS | Status: AC
  Filled 2017-02-24: qty 180

## 2017-02-24 MED ORDER — TECHNETIUM TC 99M TETROFOSMIN IV KIT
30.0000 | PACK | Freq: Once | INTRAVENOUS | Status: AC | PRN
  Administered 2017-02-24: 30 via INTRAVENOUS

## 2017-02-24 MED ORDER — REGADENOSON 0.4 MG/5ML IV SOLN
INTRAVENOUS | Status: AC
  Administered 2017-02-24: 0.4 mg via INTRAVENOUS
  Filled 2017-02-24: qty 5

## 2017-02-24 MED ORDER — SODIUM CHLORIDE 0.9% FLUSH
INTRAVENOUS | Status: AC
  Administered 2017-02-24: 10 mL via INTRAVENOUS
  Filled 2017-02-24: qty 10

## 2017-02-27 ENCOUNTER — Telehealth: Payer: Self-pay | Admitting: Cardiovascular Disease

## 2017-02-27 NOTE — Telephone Encounter (Signed)
Patient aware of stress test results.  

## 2017-02-27 NOTE — Telephone Encounter (Signed)
New Message      Please call regarding the myocardial perfusion results

## 2017-03-21 ENCOUNTER — Encounter (INDEPENDENT_AMBULATORY_CARE_PROVIDER_SITE_OTHER): Payer: Self-pay | Admitting: Internal Medicine

## 2017-03-21 ENCOUNTER — Ambulatory Visit (INDEPENDENT_AMBULATORY_CARE_PROVIDER_SITE_OTHER): Payer: BLUE CROSS/BLUE SHIELD | Admitting: Internal Medicine

## 2017-03-21 VITALS — BP 118/78 | HR 70 | Temp 97.5°F | Resp 18 | Ht 66.0 in | Wt 272.3 lb

## 2017-03-21 DIAGNOSIS — K219 Gastro-esophageal reflux disease without esophagitis: Secondary | ICD-10-CM | POA: Diagnosis not present

## 2017-03-21 DIAGNOSIS — R112 Nausea with vomiting, unspecified: Secondary | ICD-10-CM

## 2017-03-21 NOTE — Progress Notes (Signed)
Presenting complaint;  Follow-up for nausea vomiting abdominal pain and GERD.  Subjective:  Patient is 56 year old Caucasian female who is here for scheduled visit. Is been hospitalized intermittently for episodic nausea vomiting abdominal pain and diarrhea and has undergone extensive workup which has been negative. She was last hospitalized in December 2017. He says she has gone more than 4 months without any help. She has had mild self-limiting abdominal pain and she's had 2 episodes of nonbloody diarrhea. She hasn't had nausea and several weeks. She is taking PPI every other day and is working. He denies melena or rectal bleeding. She has gained 13 pounds since her last visit over 4 months ago. She says she is not able to do much walking or exercise and a cut of the arthritis. She is taking 2-4 tablets of Tylenol daily and 2-4 tablets of ibuprofen daily. She always takes ibuprofen with snack.    Current Medications: Outpatient Encounter Prescriptions as of 03/21/2017  Medication Sig  . acetaminophen (TYLENOL) 325 MG tablet Take 2 tablets (650 mg total) by mouth every 6 (six) hours as needed for mild pain (or Fever >/= 101).  Marland Kitchen albuterol (PROVENTIL HFA;VENTOLIN HFA) 108 (90 BASE) MCG/ACT inhaler Inhale 2 puffs into the lungs every 4 (four) hours as needed for wheezing or shortness of breath.  . ALPRAZolam (XANAX) 0.25 MG tablet Take 0.25 mg by mouth 2 (two) times daily as needed for anxiety.  Marland Kitchen aspirin EC 81 MG tablet Take 81 mg by mouth daily.  . Dulaglutide (TRULICITY) 8.11 BJ/4.7WG SOPN Inject 0.5 mLs into the skin every Monday. On  Monday's.   . DULoxetine (CYMBALTA) 60 MG capsule Take 60 mg by mouth daily.  Marland Kitchen glipiZIDE (GLUCOTROL) 5 MG tablet Take 5 mg by mouth 2 (two) times daily.   Marland Kitchen levothyroxine (SYNTHROID, LEVOTHROID) 75 MCG tablet Take 75 mcg by mouth daily before breakfast.  . lisinopril-hydrochlorothiazide (PRINZIDE,ZESTORETIC) 20-12.5 MG per tablet Take 1 tablet by mouth daily.   . Multiple Vitamin (MULTIVITAMIN WITH MINERALS) TABS tablet Take 1 tablet by mouth daily.  Marland Kitchen omeprazole (PRILOSEC OTC) 20 MG tablet Take 1 tablet (20 mg total) by mouth daily.  . ondansetron (ZOFRAN) 4 MG tablet Take 1 tablet (4 mg total) by mouth every 8 (eight) hours as needed for nausea or vomiting.  Marland Kitchen OVER THE COUNTER MEDICATION Patient is taking CBD Oil - 1-2 drops under tongue, patient will take when her tummy hurts.  . Zinc Sulfate (ZINC 15 PO) Take by mouth 2 (two) times daily. Patient takes the over counter form, not sure of the mg.   No facility-administered encounter medications on file as of 03/21/2017.      Objective: Blood pressure 118/78, pulse 70, temperature 97.5 F (36.4 C), temperature source Oral, resp. rate 18, height 5\' 6"  (1.676 m), weight 272 lb 4.8 oz (123.5 kg), last menstrual period 03/27/2014. Patient is alert and in no acute distress. Conjunctiva is pink. Sclera is nonicteric Oropharyngeal mucosa is normal. No neck masses or thyromegaly noted. Cardiac exam with regular rhythm normal S1 and S2. No murmur or gallop noted. Lungs are clear to auscultation. Abdomen is obese. Bowel sounds are normal. On palpation abdomen is soft and nontender without organomegaly or masses.  No LE edema or clubbing noted.   Assessment:  #1. Recurrent spells of nausea vomiting diarrhea and abdominal pain. Patient has undergone multiple studies and involve been negative including screening for acute intermittent porphyria. She has not responded to symptomatic therapy with lately she's been  doing well and I hope she would not have any more spells. If she does she needs to keep food diary. #2. GERD. She is doing well with every other day PPI.   Plan:  Patient will call if symptoms relapse. She'll keep symptom diary and return for office visit in 6 months.

## 2017-03-21 NOTE — Patient Instructions (Signed)
Keep symptom/food diary as before.
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
## 2017-08-07 ENCOUNTER — Ambulatory Visit (INDEPENDENT_AMBULATORY_CARE_PROVIDER_SITE_OTHER): Payer: BLUE CROSS/BLUE SHIELD | Admitting: Psychiatry

## 2017-08-07 ENCOUNTER — Encounter (HOSPITAL_COMMUNITY): Payer: Self-pay | Admitting: Psychiatry

## 2017-08-07 ENCOUNTER — Encounter (INDEPENDENT_AMBULATORY_CARE_PROVIDER_SITE_OTHER): Payer: Self-pay

## 2017-08-07 VITALS — BP 140/102 | HR 82 | Ht 66.0 in | Wt 278.0 lb

## 2017-08-07 DIAGNOSIS — Z638 Other specified problems related to primary support group: Secondary | ICD-10-CM

## 2017-08-07 DIAGNOSIS — Z818 Family history of other mental and behavioral disorders: Secondary | ICD-10-CM | POA: Diagnosis not present

## 2017-08-07 DIAGNOSIS — R109 Unspecified abdominal pain: Secondary | ICD-10-CM | POA: Diagnosis not present

## 2017-08-07 DIAGNOSIS — E119 Type 2 diabetes mellitus without complications: Secondary | ICD-10-CM

## 2017-08-07 DIAGNOSIS — F331 Major depressive disorder, recurrent, moderate: Secondary | ICD-10-CM | POA: Diagnosis not present

## 2017-08-07 DIAGNOSIS — Z79899 Other long term (current) drug therapy: Secondary | ICD-10-CM

## 2017-08-07 DIAGNOSIS — G4700 Insomnia, unspecified: Secondary | ICD-10-CM

## 2017-08-07 DIAGNOSIS — F419 Anxiety disorder, unspecified: Secondary | ICD-10-CM | POA: Diagnosis not present

## 2017-08-07 DIAGNOSIS — I10 Essential (primary) hypertension: Secondary | ICD-10-CM

## 2017-08-07 DIAGNOSIS — K580 Irritable bowel syndrome with diarrhea: Secondary | ICD-10-CM

## 2017-08-07 DIAGNOSIS — E039 Hypothyroidism, unspecified: Secondary | ICD-10-CM

## 2017-08-07 MED ORDER — DULOXETINE HCL 60 MG PO CPEP: 60.0000 mg | ORAL_CAPSULE | ORAL | 1 refills | Status: DC

## 2017-08-07 MED ORDER — TRAZODONE HCL 50 MG PO TABS: ORAL_TABLET | ORAL | 1 refills | Status: DC

## 2017-08-07 NOTE — Patient Instructions (Signed)
1. Continue duloxetine 60 mg in AM, 30 mg in PM  2. Discontinue Xanax 3. Start trazodone 25-50 mg at night as needed for sleep 4. Referral to therapy  5. Return to clinic in one month for 30 mins

## 2017-09-04 ENCOUNTER — Telehealth (HOSPITAL_COMMUNITY): Payer: Self-pay | Admitting: *Deleted

## 2017-09-04 ENCOUNTER — Ambulatory Visit (INDEPENDENT_AMBULATORY_CARE_PROVIDER_SITE_OTHER): Payer: BLUE CROSS/BLUE SHIELD | Admitting: Licensed Clinical Social Worker

## 2017-09-04 DIAGNOSIS — F331 Major depressive disorder, recurrent, moderate: Secondary | ICD-10-CM

## 2017-09-04 NOTE — Telephone Encounter (Signed)
Pt came into office to see another provider. Per pt she is out of her Trazodone. Per pt she did not call the office to inform office that she had uped her meds to 2 tabs of the 50 mg. Per pt her appt with provider is 09-06-2017.
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
## 2017-09-05 NOTE — Telephone Encounter (Signed)
Will order after tomorrow's visit.

## 2017-09-06 ENCOUNTER — Ambulatory Visit (INDEPENDENT_AMBULATORY_CARE_PROVIDER_SITE_OTHER): Payer: BLUE CROSS/BLUE SHIELD | Admitting: Psychiatry

## 2017-09-06 ENCOUNTER — Encounter (HOSPITAL_COMMUNITY): Payer: Self-pay | Admitting: Psychiatry

## 2017-09-06 VITALS — BP 131/64 | HR 89 | Ht 66.0 in | Wt 280.0 lb

## 2017-09-06 DIAGNOSIS — F331 Major depressive disorder, recurrent, moderate: Secondary | ICD-10-CM | POA: Diagnosis not present

## 2017-09-06 DIAGNOSIS — G4700 Insomnia, unspecified: Secondary | ICD-10-CM | POA: Diagnosis not present

## 2017-09-06 DIAGNOSIS — E119 Type 2 diabetes mellitus without complications: Secondary | ICD-10-CM

## 2017-09-06 DIAGNOSIS — M5430 Sciatica, unspecified side: Secondary | ICD-10-CM

## 2017-09-06 DIAGNOSIS — K589 Irritable bowel syndrome without diarrhea: Secondary | ICD-10-CM

## 2017-09-06 DIAGNOSIS — Z79899 Other long term (current) drug therapy: Secondary | ICD-10-CM | POA: Diagnosis not present

## 2017-09-06 DIAGNOSIS — Z818 Family history of other mental and behavioral disorders: Secondary | ICD-10-CM

## 2017-09-06 DIAGNOSIS — I10 Essential (primary) hypertension: Secondary | ICD-10-CM | POA: Diagnosis not present

## 2017-09-06 DIAGNOSIS — R45851 Suicidal ideations: Secondary | ICD-10-CM | POA: Diagnosis not present

## 2017-09-06 DIAGNOSIS — E785 Hyperlipidemia, unspecified: Secondary | ICD-10-CM | POA: Diagnosis not present

## 2017-09-06 MED ORDER — DULOXETINE HCL 60 MG PO CPEP: 60.0000 mg | ORAL_CAPSULE | ORAL | 0 refills | Status: DC

## 2017-09-06 MED ORDER — TRAZODONE HCL 100 MG PO TABS: ORAL_TABLET | ORAL | 0 refills | Status: DC

## 2017-09-06 NOTE — Telephone Encounter (Signed)
noted 

## 2017-09-06 NOTE — Patient Instructions (Addendum)
1. Increase duloxetine 60 mg twice a day 2. Increase trazodone 100- 150  mg at night as needed for sleep 3. Try to use less of Xanax 4. Return to clinic in one month for 30 mins

## 2017-09-07 ENCOUNTER — Encounter (HOSPITAL_COMMUNITY): Payer: Self-pay | Admitting: Emergency Medicine

## 2017-09-07 ENCOUNTER — Emergency Department (HOSPITAL_COMMUNITY)
Admission: EM | Admit: 2017-09-07 | Discharge: 2017-09-07 | Disposition: A | Discharge status: Home / Self Care | Payer: BLUE CROSS/BLUE SHIELD | Attending: Emergency Medicine | Admitting: Emergency Medicine

## 2017-09-07 ENCOUNTER — Emergency Department (HOSPITAL_COMMUNITY): Payer: BLUE CROSS/BLUE SHIELD

## 2017-09-07 DIAGNOSIS — I10 Essential (primary) hypertension: Secondary | ICD-10-CM | POA: Diagnosis not present

## 2017-09-07 DIAGNOSIS — Z7982 Long term (current) use of aspirin: Secondary | ICD-10-CM | POA: Insufficient documentation

## 2017-09-07 DIAGNOSIS — R1030 Lower abdominal pain, unspecified: Secondary | ICD-10-CM | POA: Diagnosis present

## 2017-09-07 DIAGNOSIS — Z79899 Other long term (current) drug therapy: Secondary | ICD-10-CM | POA: Diagnosis not present

## 2017-09-07 DIAGNOSIS — K5792 Diverticulitis of intestine, part unspecified, without perforation or abscess without bleeding: Secondary | ICD-10-CM

## 2017-09-07 DIAGNOSIS — Z7984 Long term (current) use of oral hypoglycemic drugs: Secondary | ICD-10-CM | POA: Insufficient documentation

## 2017-09-07 DIAGNOSIS — E119 Type 2 diabetes mellitus without complications: Secondary | ICD-10-CM | POA: Diagnosis not present

## 2017-09-07 LAB — COMPREHENSIVE METABOLIC PANEL
ALK PHOS: 101 U/L (ref 38–126)
ALT: 24 U/L (ref 14–54)
ANION GAP: 11 (ref 5–15)
AST: 22 U/L (ref 15–41)
Albumin: 4.2 g/dL (ref 3.5–5.0)
BUN: 18 mg/dL (ref 6–20)
CALCIUM: 9.6 mg/dL (ref 8.9–10.3)
CO2: 30 mmol/L (ref 22–32)
Chloride: 99 mmol/L — ABNORMAL LOW (ref 101–111)
Creatinine, Ser: 0.67 mg/dL (ref 0.44–1.00)
GFR calc Af Amer: >60 mL/min (ref >60)
GFR calc non Af Amer: >60 mL/min (ref >60)
Glucose, Bld: 139 mg/dL — ABNORMAL HIGH (ref 65–99)
Potassium: 4.4 mmol/L (ref 3.5–5.1)
SODIUM: 140 mmol/L (ref 135–145)
TOTAL PROTEIN: 8.5 g/dL — AB (ref 6.5–8.1)
Total Bilirubin: 0.5 mg/dL (ref 0.3–1.2)

## 2017-09-07 LAB — URINALYSIS, ROUTINE W REFLEX MICROSCOPIC
BILIRUBIN URINE: NEGATIVE
Glucose, UA: NEGATIVE mg/dL
HGB URINE DIPSTICK: NEGATIVE
KETONES UR: NEGATIVE mg/dL
NITRITE: NEGATIVE
PROTEIN: NEGATIVE mg/dL
Specific Gravity, Urine: 1.036 — ABNORMAL HIGH (ref 1.005–1.030)
pH: 6 (ref 5.0–8.0)

## 2017-09-07 LAB — CBC
HCT: 45.5 % (ref 36.0–46.0)
HEMOGLOBIN: 15 g/dL (ref 12.0–15.0)
MCH: 28.4 pg (ref 26.0–34.0)
MCHC: 33 g/dL (ref 30.0–36.0)
MCV: 86.2 fL (ref 78.0–100.0)
Platelets: 338 10*3/uL (ref 150–400)
RBC: 5.28 MIL/uL — AB (ref 3.87–5.11)
RDW: 14.3 % (ref 11.5–15.5)
WBC: 18.8 10*3/uL — ABNORMAL HIGH (ref 4.0–10.5)

## 2017-09-07 LAB — LACTIC ACID, PLASMA
LACTIC ACID, VENOUS: 2.6 mmol/L — AB (ref 0.5–1.9)
Lactic Acid, Venous: 1.8 mmol/L (ref 0.5–1.9)

## 2017-09-07 LAB — LIPASE, BLOOD: LIPASE: 59 U/L — AB (ref 11–51)

## 2017-09-07 MED ORDER — ONDANSETRON HCL 4 MG/2ML IJ SOLN
4.0000 mg | Freq: Once | INTRAMUSCULAR | Status: AC
  Administered 2017-09-07: 4 mg via INTRAVENOUS
  Filled 2017-09-07: qty 2

## 2017-09-07 MED ORDER — SODIUM CHLORIDE 0.9 % IV SOLN: Freq: Once | INTRAVENOUS | Status: DC

## 2017-09-07 MED ORDER — HYDROCODONE-ACETAMINOPHEN 5-325 MG PO TABS: 1.0000 | ORAL_TABLET | ORAL | 0 refills | Status: DC | PRN

## 2017-09-07 MED ORDER — HYDROCODONE-ACETAMINOPHEN 5-325 MG PO TABS
1.0000 | ORAL_TABLET | Freq: Once | ORAL | Status: AC
  Administered 2017-09-07: 1 via ORAL
  Filled 2017-09-07: qty 1

## 2017-09-07 MED ORDER — METRONIDAZOLE 500 MG PO TABS
500.0000 mg | ORAL_TABLET | Freq: Once | ORAL | Status: AC
  Administered 2017-09-07: 500 mg via ORAL
  Filled 2017-09-07: qty 1

## 2017-09-07 MED ORDER — METRONIDAZOLE 500 MG PO TABS: 500.0000 mg | ORAL_TABLET | Freq: Two times a day (BID) | ORAL | 0 refills | Status: DC

## 2017-09-07 MED ORDER — ONDANSETRON 4 MG PO TBDP: 4.0000 mg | ORAL_TABLET | Freq: Three times a day (TID) | ORAL | 0 refills | Status: DC | PRN

## 2017-09-07 MED ORDER — SODIUM CHLORIDE 0.9 % IV BOLUS (SEPSIS)
1000.0000 mL | Freq: Once | INTRAVENOUS | Status: AC
  Administered 2017-09-07: 1000 mL via INTRAVENOUS

## 2017-09-07 MED ORDER — MORPHINE SULFATE (PF) 4 MG/ML IV SOLN
4.0000 mg | INTRAVENOUS | Status: DC | PRN
  Administered 2017-09-07: 4 mg via INTRAVENOUS
  Filled 2017-09-07: qty 1

## 2017-09-07 MED ORDER — CIPROFLOXACIN HCL 250 MG PO TABS
500.0000 mg | ORAL_TABLET | Freq: Once | ORAL | Status: AC
  Administered 2017-09-07: 500 mg via ORAL
  Filled 2017-09-07: qty 2

## 2017-09-07 MED ORDER — MORPHINE SULFATE (PF) 2 MG/ML IV SOLN
2.0000 mg | Freq: Once | INTRAVENOUS | Status: AC
  Administered 2017-09-07: 2 mg via INTRAVENOUS
  Filled 2017-09-07: qty 1

## 2017-09-07 MED ORDER — CIPROFLOXACIN HCL 500 MG PO TABS: 500.0000 mg | ORAL_TABLET | Freq: Two times a day (BID) | ORAL | 0 refills | Status: DC

## 2017-09-07 MED ORDER — IOPAMIDOL (ISOVUE-300) INJECTION 61%
100.0000 mL | Freq: Once | INTRAVENOUS | Status: AC | PRN
  Administered 2017-09-07: 100 mL via INTRAVENOUS

## 2017-09-07 NOTE — Discharge Instructions (Signed)
Low fiber, liquid diet until symptoms resolve. Then resume high fiber diet.

## 2017-09-07 NOTE — ED Notes (Signed)
Pt was advised urine sample needed. Cannot give sample at the moment. Will call when she is ready.

## 2017-09-07 NOTE — ED Provider Notes (Signed)
[facility] EMERGENCY DEPARTMENT Provider Note   CSN: [id number] Arrival date & time: 09/07/17  1432     History   Chief Complaint Chief Complaint  Patient presents with  . Abdominal Pain    HPI Kristin Bailey is a 56 y.o. female. Chief complaint is abdominal pain.  HPI: 56 year old female. Describes abdominal pain starting earlier today. Symptoms of congestive worsening today. She describes multiple episodes over t previous time was diagnosed with diverticulitis. 2 other times had enteritis with mesenteric edema. Had a colonoscopy. Told her testing for Crohn's and ulcerative colitis were negative. Follows with GI, Dr. Laural Golden.   Past Medical History:  Diagnosis Date  . Depression   . Diabetes mellitus without complication ([place])   . Diverticulitis   . GERD (gastroesophageal reflux disease)   . Hypertension   . IBS (irritable bowel syndrome)   . Pneumonia   . Thyroid disease     Patient Active Problem List   Diagnosis Date Noted  . Noninfectious gastroenteritis 11/28/2016  . Enteritis 11/04/2016  . Lactic acidosis 11/04/2016  . Enterocolitis 03/17/2016  . Gastroenteritis, infectious 03/17/2016  . Leukocytosis 11/28/2015  . Acute respiratory failure with hypoxia ([place]) 11/28/2015  . Controlled type 2 diabetes mellitus without complication, without long-term current use of insulin ([place]) 11/28/2015  . UTI (lower urinary tract infection) 11/27/2015  . Colitis 11/27/2015  . AKI (acute kidney injury) ([place]) 11/27/2015    Past Surgical History:  Procedure Laterality Date  . AGILE CAPSULE N/A 11/23/2016   Procedure: AGILE CAPSULE;  Surgeon: Rogene Houston, MD;  Location: [facility] ENDO SUITE;  Service: Endoscopy;  Laterality: N/A;  8:30  . CARPAL TUNNEL RELEASE    . CESAREAN SECTION    . CHOLECYSTECTOMY    . COLONOSCOPY N/A 08/28/2014   Procedure: COLONOSCOPY;  Surgeon: Rogene Houston, MD;  Location: [facility] ENDO SUITE;  Service: Endoscopy;  Laterality: N/A;  1030  . CYST REMOVAL  HAND    . ESOPHAGOGASTRODUODENOSCOPY N/A 03/19/2016   Procedure: ESOPHAGOGASTRODUODENOSCOPY (EGD);  Surgeon: Rogene Houston, MD;  Location: [facility];  Service: Endoscopy;  Laterality: N/A;  . FRACTURE SURGERY     rt ankle   . GIVENS CAPSULE STUDY N/A 12/15/2016   Procedure: GIVENS CAPSULE STUDY;  Surgeon: Rogene Houston, MD;  Location: [facility];  Service: Endoscopy;  Laterality: N/A;  . KNEE SURGERY     meniscus repair  . TUBAL LIGATION      OB History    Gravida Para Term Preterm AB Living   1 1 1     1    SAB TAB Ectopic Multiple Live Births                   Home Medications    Prior to Admission medications   Medication Sig Start Date End Date Taking? Authorizing Provider  acetaminophen (TYLENOL 8 HOUR ARTHRITIS PAIN) 650 MG CR tablet Take 1,300 mg by mouth every morning. *May take additional as needed   Yes [provider]  albuterol (PROVENTIL HFA;VENTOLIN HFA) 108 (90 BASE) MCG/ACT inhaler Inhale 2 puffs into the lungs every 4 (four) hours as needed for wheezing or shortness of breath.   Yes [provider]  ALPRAZolam (XANAX) 0.25 MG tablet Take 0.25 mg by mouth 2 (two) times daily as needed for anxiety.   Yes [provider]  aspirin EC 81 MG tablet Take 81 mg by mouth daily.   Yes [provider]  Dulaglutide (TRULICITY) 0.25  MG/0.5ML SOPN Inject 0.5 mLs into the skin every Monday. On  Monday's.    Yes [provider]  DULoxetine (CYMBALTA) 30 MG capsule Take 30 mg by mouth at bedtime.   Yes [provider]  DULoxetine (CYMBALTA) 60 MG capsule Take 1 capsule (60 mg total) by mouth every morning. 09/06/17  Yes Hisada, Elie Goody, MD  glipiZIDE (GLUCOTROL) 5 MG tablet Take 5 mg by mouth 2 (two) times daily.    Yes [provider]  levothyroxine (SYNTHROID, LEVOTHROID) 100 MCG tablet  08/16/17  Yes [provider]  lisinopril-hydrochlorothiazide (PRINZIDE,ZESTORETIC) 20-12.5 MG tablet TAKE ONE TABLET BY  MOUTH DAILY 01/03/17 12/28/17 Yes [provider]  montelukast (SINGULAIR) 10 MG tablet Take 10 mg by mouth at bedtime.   Yes [provider]  Multiple Vitamin (MULTIVITAMIN WITH MINERALS) TABS tablet Take 1 tablet by mouth daily.   Yes [provider]  omeprazole (PRILOSEC OTC) 20 MG tablet Take 1 tablet (20 mg total) by mouth daily. Patient taking differently: Take 20 mg by mouth every other day. BEDTIME 08/30/15  Yes Carmin Muskrat, MD  ondansetron (ZOFRAN) 4 MG tablet Take 1 tablet (4 mg total) by mouth every 8 (eight) hours as needed for nausea or vomiting. 11/16/16  Yes Setzer, Rona Ravens, NP  [place] Patient is taking CBD Oil - 1-2 drops under tongue, patient will take when her tummy hurts. PRN   Yes [provider]  traZODone (DESYREL) 100 MG tablet 100-150 mg at night as needed for sleep Patient taking differently: Take 100-150 mg by mouth at bedtime as needed for sleep. 100-150 mg at night as needed for sleep 09/06/17  Yes Hisada, Elie Goody, MD  ciprofloxacin (CIPRO) 500 MG tablet Take 1 tablet (500 mg total) by mouth 2 (two) times daily. 09/07/17   Tanna Furry, MD  HYDROcodone-acetaminophen (NORCO/VICODIN) 5-325 MG tablet Take 1 tablet by mouth every 4 (four) hours as needed. 09/07/17   Tanna Furry, MD  metroNIDAZOLE (FLAGYL) 500 MG tablet Take 1 tablet (500 mg total) by mouth 2 (two) times daily. 09/07/17   Tanna Furry, MD  ondansetron (ZOFRAN ODT) 4 MG disintegrating tablet Take 1 tablet (4 mg total) by mouth every 8 (eight) hours as needed for nausea. 09/07/17   Tanna Furry, MD    Family History Family History  Problem Relation Age of Onset  . Cancer Mother        colon cancer age74  . Hypertension Mother   . Stroke Mother   . Diabetes Mother   . Non-Hodgkin's lymphoma Father   . Hypertension Father   . Diabetes Father   . Stroke Maternal Grandmother   . Kidney disease Paternal Aunt   . Cancer Paternal Aunt   . Kidney disease  Paternal Uncle   . Cancer Paternal Uncle     Social History Social History  Substance Use Topics  . Smoking status: Never Smoker  . Smokeless tobacco: Never Used  . Alcohol use No     Allergies   Patient has no known allergies.   Review of Systems Review of Systems  Constitutional: Negative for appetite change, chills, diaphoresis, fatigue and fever.  HENT: Negative for mouth sores, sore throat and trouble swallowing.   Eyes: Negative for visual disturbance.  Respiratory: Negative for cough, chest tightness, shortness of breath and wheezing.   Cardiovascular: Negative for chest pain.  Gastrointestinal: Positive for abdominal pain and nausea. Negative for diarrhea and vomiting.  Endocrine: Negative for polydipsia, polyphagia and polyuria.  Genitourinary: Negative for dysuria, frequency and hematuria.  Musculoskeletal: Negative for gait problem.  Skin: Negative for color change, pallor and rash.  Neurological: Negative for dizziness, syncope, light-headedness and headaches.  Hematological: Does not bruise/bleed easily.  Psychiatric/Behavioral: Negative for behavioral problems and confusion.     Physical Exam Updated Vital Signs BP (!) 102/50   Pulse 76   Temp 98.6 F (37 C) (Oral)   Resp 18   Ht 5\' 6"  (1.676 m)   Wt 127 kg (280 lb)   LMP 03/27/2014 Comment: irregular  SpO2 98%   BMI 45.19 kg/m   Physical Exam  Constitutional: She is oriented to person, place, and time. She appears well-developed and well-nourished. No distress.  HENT:  Head: Normocephalic.  Eyes: Pupils are equal, round, and reactive to light. Conjunctivae are normal. No scleral icterus.  Neck: Normal range of motion. Neck supple. No thyromegaly present.  Cardiovascular: Normal rate and regular rhythm.  Exam reveals no gallop and no friction rub.   No murmur heard. Pulmonary/Chest: Effort normal and breath sounds normal. No respiratory distress. She has no wheezes. She has no rales.  Abdominal:  Soft. Bowel sounds are normal. She exhibits no distension. There is no tenderness. There is no rebound.  Slight tenderness in left mid abdomen without guarding or rebound.  Musculoskeletal: Normal range of motion.  Neurological: She is alert and oriented to person, place, and time.  Skin: Skin is warm and dry. No rash noted.  Psychiatric: She has a normal mood and affect. Her behavior is normal.     ED Treatments / Results  Labs (all labs ordered are listed, but only abnormal results are displayed) Labs Reviewed  LIPASE, BLOOD - Abnormal; Notable for the following:       Result Value   Lipase 59 (*)    All other components within normal limits  COMPREHENSIVE METABOLIC PANEL - Abnormal; Notable for the following:    Chloride 99 (*)    Glucose, Bld 139 (*)    Total Protein 8.5 (*)    All other components within normal limits  CBC - Abnormal; Notable for the following:    WBC 18.8 (*)    RBC 5.28 (*)    All other components within normal limits  URINALYSIS, ROUTINE W REFLEX MICROSCOPIC - Abnormal; Notable for the following:    Specific Gravity, Urine 1.036 (*)    Leukocytes, UA TRACE (*)    Bacteria, UA RARE (*)    Squamous Epithelial / LPF 0-5 (*)    All other components within normal limits  LACTIC ACID, PLASMA - Abnormal; Notable for the following:    Lactic Acid, Venous 2.6 (*)    All other components within normal limits  LACTIC ACID, PLASMA    EKG  EKG Interpretation None       Radiology Ct Abdomen Pelvis W Contrast  Result Date: 09/07/2017 CLINICAL DATA:  Patient with abdominal cramping and nausea. EXAM: CT ABDOMEN AND PELVIS WITH CONTRAST TECHNIQUE: Multidetector CT imaging of the abdomen and pelvis was performed using the standard protocol following bolus administration of intravenous contrast. CONTRAST:  176mL ISOVUE-300 IOPAMIDOL (ISOVUE-300) INJECTION 61% COMPARISON:  CT abdomen pelvis 11/04/2016 FINDINGS: Lower chest: Normal heart size. No pericardial  effusion. Dependent atelectasis within the lower lobes bilaterally. Hepatobiliary: Liver is diffusely low in attenuation compatible with steatosis. No focal hepatic lesion is identified. Gallbladder surgically absent. No intrahepatic or extrahepatic biliary ductal dilatation. Pancreas: Unremarkable Spleen: Unremarkable Adrenals/Urinary Tract: Normal adrenal gland. Kidneys are symmetric  in size. No hydronephrosis. Stomach/Bowel: Descending and sigmoid colonic diverticulosis. There is mild wall thickening and fat stranding about the sigmoid colon. No evidence for bowel obstruction. Normal morphology of the stomach. No free fluid or free intraperitoneal air. Vascular/Lymphatic: Normal caliber abdominal aorta. No retroperitoneal lymphadenopathy. Reproductive: Uterus and adnexal structures are unremarkable. Other: None. Musculoskeletal: Probable bone island within the right ilium. Lumbar spine degenerative changes. IMPRESSION: 1. Mild fat stranding about the sigmoid colon raising the possibility of mild acute sigmoid diverticulitis. 2. Hepatic steatosis. Electronically Signed   By: Lovey Newcomer M.D.   On: 09/07/2017 17:25    Procedures Procedures (including critical care time)  Medications Ordered in ED Medications  0.9 %  sodium chloride infusion (not administered)  morphine 4 MG/ML injection 4 mg (4 mg Intravenous Given 09/07/17 1553)  sodium chloride 0.9 % bolus 1,000 mL (0 mLs Intravenous Stopped 09/07/17 2002)  ondansetron (ZOFRAN) injection 4 mg (4 mg Intravenous Given 09/07/17 1553)  sodium chloride 0.9 % bolus 1,000 mL (0 mLs Intravenous Stopped 09/07/17 2002)  iopamidol (ISOVUE-300) 61 % injection 100 mL (100 mLs Intravenous Contrast Given 09/07/17 1652)  ondansetron (ZOFRAN) injection 4 mg (4 mg Intravenous Given 09/07/17 1847)  HYDROcodone-acetaminophen (NORCO/VICODIN) 5-325 MG per tablet 1 tablet (1 tablet Oral Given 09/07/17 1847)  morphine 2 MG/ML injection 2 mg (2 mg Intravenous Given  09/07/17 1847)  metroNIDAZOLE (FLAGYL) tablet 500 mg (500 mg Oral Given 09/07/17 1847)  ciprofloxacin (CIPRO) tablet 500 mg (500 mg Oral Given 09/07/17 1847)     Initial Impression / Assessment and Plan / ED Course  I have reviewed the triage vital signs and the nursing notes.  Pertinent labs & imaging results that were available during my care of the patient were reviewed by me and considered in my medical decision making (see chart for details).   CT scan shows an comfort care diverticulitis. Her symptoms are improved after IV pain medication. Given by mouth Flagyl and Cipro. Tolerating by mouth. Given first dose Vicodin for pain. Desirous of being discharged home. She is appropriate for outpatient treatment. Instructed in reasons for recheck.   Final Clinical Impressions(s) / ED Diagnoses   Final diagnoses:  Lower abdominal pain  Diverticulitis    New Prescriptions Discharge Medication List as of 09/07/2017  7:29 PM    START taking these medications   Details  ciprofloxacin (CIPRO) 500 MG tablet Take 1 tablet (500 mg total) by mouth 2 (two) times daily., Starting Thu 09/07/2017, Print    HYDROcodone-acetaminophen (NORCO/VICODIN) 5-325 MG tablet Take 1 tablet by mouth every 4 (four) hours as needed., Starting Thu 09/07/2017, Print    metroNIDAZOLE (FLAGYL) 500 MG tablet Take 1 tablet (500 mg total) by mouth 2 (two) times daily., Starting Thu 09/07/2017, Print    ondansetron (ZOFRAN ODT) 4 MG disintegrating tablet Take 1 tablet (4 mg total) by mouth every 8 (eight) hours as needed for nausea., Starting Thu 09/07/2017, Print         Tanna Furry, MD 09/07/17 2103

## 2017-09-07 NOTE — ED Notes (Signed)
Pt reminded of need for urine sample. States she still cannot provide one at this time but will when able

## 2017-09-07 NOTE — ED Triage Notes (Signed)
Patient complains of abdominal cramps with nausea x 1 week. States vomited last week none in past 24 hours. States dry heaves today.  States constipation. Denies fever/chills.

## 2017-09-26 ENCOUNTER — Emergency Department (HOSPITAL_COMMUNITY): Payer: BLUE CROSS/BLUE SHIELD

## 2017-09-26 ENCOUNTER — Encounter (HOSPITAL_COMMUNITY): Payer: Self-pay | Admitting: Emergency Medicine

## 2017-09-26 ENCOUNTER — Inpatient Hospital Stay (HOSPITAL_COMMUNITY): Payer: BLUE CROSS/BLUE SHIELD

## 2017-09-26 ENCOUNTER — Encounter (INDEPENDENT_AMBULATORY_CARE_PROVIDER_SITE_OTHER): Payer: Self-pay | Admitting: Internal Medicine

## 2017-09-26 ENCOUNTER — Inpatient Hospital Stay (HOSPITAL_COMMUNITY)
Admission: EM | Admit: 2017-09-26 | Discharge: 2017-09-29 | DRG: 392 | Disposition: A | Discharge status: Home / Self Care | Payer: BLUE CROSS/BLUE SHIELD | Attending: Internal Medicine | Admitting: Internal Medicine

## 2017-09-26 ENCOUNTER — Ambulatory Visit (INDEPENDENT_AMBULATORY_CARE_PROVIDER_SITE_OTHER): Payer: BLUE CROSS/BLUE SHIELD | Admitting: Internal Medicine

## 2017-09-26 VITALS — BP 100/70 | HR 72 | Temp 98.9°F | Resp 18 | Ht 66.0 in | Wt 275.2 lb

## 2017-09-26 DIAGNOSIS — Z7982 Long term (current) use of aspirin: Secondary | ICD-10-CM | POA: Diagnosis not present

## 2017-09-26 DIAGNOSIS — Z7984 Long term (current) use of oral hypoglycemic drugs: Secondary | ICD-10-CM

## 2017-09-26 DIAGNOSIS — Z807 Family history of other malignant neoplasms of lymphoid, hematopoietic and related tissues: Secondary | ICD-10-CM | POA: Diagnosis not present

## 2017-09-26 DIAGNOSIS — G8929 Other chronic pain: Secondary | ICD-10-CM | POA: Diagnosis present

## 2017-09-26 DIAGNOSIS — K5730 Diverticulosis of large intestine without perforation or abscess without bleeding: Secondary | ICD-10-CM | POA: Diagnosis not present

## 2017-09-26 DIAGNOSIS — R109 Unspecified abdominal pain: Secondary | ICD-10-CM

## 2017-09-26 DIAGNOSIS — R1032 Left lower quadrant pain: Principal | ICD-10-CM

## 2017-09-26 DIAGNOSIS — E119 Type 2 diabetes mellitus without complications: Secondary | ICD-10-CM | POA: Diagnosis present

## 2017-09-26 DIAGNOSIS — I10 Essential (primary) hypertension: Secondary | ICD-10-CM | POA: Diagnosis present

## 2017-09-26 DIAGNOSIS — F419 Anxiety disorder, unspecified: Secondary | ICD-10-CM | POA: Diagnosis present

## 2017-09-26 DIAGNOSIS — Z8249 Family history of ischemic heart disease and other diseases of the circulatory system: Secondary | ICD-10-CM | POA: Diagnosis not present

## 2017-09-26 DIAGNOSIS — K529 Noninfective gastroenteritis and colitis, unspecified: Secondary | ICD-10-CM | POA: Diagnosis present

## 2017-09-26 DIAGNOSIS — Z6841 Body Mass Index (BMI) 40.0 and over, adult: Secondary | ICD-10-CM | POA: Diagnosis not present

## 2017-09-26 DIAGNOSIS — Z833 Family history of diabetes mellitus: Secondary | ICD-10-CM

## 2017-09-26 DIAGNOSIS — E871 Hypo-osmolality and hyponatremia: Secondary | ICD-10-CM | POA: Diagnosis present

## 2017-09-26 DIAGNOSIS — D72829 Elevated white blood cell count, unspecified: Secondary | ICD-10-CM | POA: Diagnosis present

## 2017-09-26 DIAGNOSIS — K219 Gastro-esophageal reflux disease without esophagitis: Secondary | ICD-10-CM | POA: Diagnosis present

## 2017-09-26 DIAGNOSIS — Z23 Encounter for immunization: Secondary | ICD-10-CM

## 2017-09-26 DIAGNOSIS — K648 Other hemorrhoids: Secondary | ICD-10-CM | POA: Diagnosis present

## 2017-09-26 DIAGNOSIS — R112 Nausea with vomiting, unspecified: Secondary | ICD-10-CM

## 2017-09-26 DIAGNOSIS — R933 Abnormal findings on diagnostic imaging of other parts of digestive tract: Secondary | ICD-10-CM | POA: Diagnosis not present

## 2017-09-26 DIAGNOSIS — E039 Hypothyroidism, unspecified: Secondary | ICD-10-CM | POA: Diagnosis present

## 2017-09-26 DIAGNOSIS — Z800 Family history of malignant neoplasm of digestive organs: Secondary | ICD-10-CM | POA: Diagnosis not present

## 2017-09-26 DIAGNOSIS — Z823 Family history of stroke: Secondary | ICD-10-CM | POA: Diagnosis not present

## 2017-09-26 DIAGNOSIS — F329 Major depressive disorder, single episode, unspecified: Secondary | ICD-10-CM | POA: Diagnosis present

## 2017-09-26 DIAGNOSIS — K589 Irritable bowel syndrome without diarrhea: Secondary | ICD-10-CM | POA: Diagnosis present

## 2017-09-26 DIAGNOSIS — K5792 Diverticulitis of intestine, part unspecified, without perforation or abscess without bleeding: Secondary | ICD-10-CM | POA: Diagnosis present

## 2017-09-26 DIAGNOSIS — K6389 Other specified diseases of intestine: Secondary | ICD-10-CM | POA: Diagnosis not present

## 2017-09-26 LAB — COMPREHENSIVE METABOLIC PANEL
ALK PHOS: 83 U/L (ref 38–126)
ALT: 26 U/L (ref 14–54)
AST: 21 U/L (ref 15–41)
Albumin: 4.5 g/dL (ref 3.5–5.0)
Anion gap: 11 (ref 5–15)
BUN: 18 mg/dL (ref 6–20)
CALCIUM: 9.8 mg/dL (ref 8.9–10.3)
CO2: 26 mmol/L (ref 22–32)
CREATININE: 0.61 mg/dL (ref 0.44–1.00)
Chloride: 96 mmol/L — ABNORMAL LOW (ref 101–111)
Glucose, Bld: 106 mg/dL — ABNORMAL HIGH (ref 65–99)
Potassium: 4.2 mmol/L (ref 3.5–5.1)
Sodium: 133 mmol/L — ABNORMAL LOW (ref 135–145)
Total Bilirubin: 0.7 mg/dL (ref 0.3–1.2)
Total Protein: 8.4 g/dL — ABNORMAL HIGH (ref 6.5–8.1)

## 2017-09-26 LAB — CBC
HEMATOCRIT: 44.8 % (ref 36.0–46.0)
Hemoglobin: 15.3 g/dL — ABNORMAL HIGH (ref 12.0–15.0)
MCH: 29.3 pg (ref 26.0–34.0)
MCHC: 34.2 g/dL (ref 30.0–36.0)
MCV: 85.7 fL (ref 78.0–100.0)
PLATELETS: 361 10*3/uL (ref 150–400)
RBC: 5.23 MIL/uL — ABNORMAL HIGH (ref 3.87–5.11)
RDW: 14.4 % (ref 11.5–15.5)
WBC: 17.9 10*3/uL — AB (ref 4.0–10.5)

## 2017-09-26 LAB — URINALYSIS, ROUTINE W REFLEX MICROSCOPIC
BILIRUBIN URINE: NEGATIVE
GLUCOSE, UA: NEGATIVE mg/dL
HGB URINE DIPSTICK: NEGATIVE
KETONES UR: NEGATIVE mg/dL
LEUKOCYTES UA: NEGATIVE
Nitrite: NEGATIVE
PH: 5 (ref 5.0–8.0)
PROTEIN: NEGATIVE mg/dL
Specific Gravity, Urine: 1.02 (ref 1.005–1.030)

## 2017-09-26 LAB — GLUCOSE, CAPILLARY: GLUCOSE-CAPILLARY: 151 mg/dL — AB (ref 65–99)

## 2017-09-26 LAB — LIPASE, BLOOD: LIPASE: 47 U/L (ref 11–51)

## 2017-09-26 MED ORDER — SODIUM CHLORIDE 0.9 % IV SOLN
INTRAVENOUS | Status: AC
  Administered 2017-09-26: via INTRAVENOUS

## 2017-09-26 MED ORDER — MORPHINE SULFATE (PF) 4 MG/ML IV SOLN
4.0000 mg | Freq: Once | INTRAVENOUS | Status: AC
  Administered 2017-09-26: 4 mg via INTRAVENOUS
  Filled 2017-09-26: qty 1

## 2017-09-26 MED ORDER — PIPERACILLIN-TAZOBACTAM 3.375 G IVPB 30 MIN
3.3750 g | Freq: Once | INTRAVENOUS | Status: AC
  Administered 2017-09-26: 3.375 g via INTRAVENOUS
  Filled 2017-09-26: qty 50

## 2017-09-26 MED ORDER — ONDANSETRON HCL 4 MG/2ML IJ SOLN
4.0000 mg | Freq: Four times a day (QID) | INTRAMUSCULAR | Status: DC | PRN
  Administered 2017-09-27 – 2017-09-28 (×3): 4 mg via INTRAVENOUS
  Filled 2017-09-26 (×3): qty 2

## 2017-09-26 MED ORDER — LISINOPRIL 10 MG PO TABS
20.0000 mg | ORAL_TABLET | Freq: Every day | ORAL | Status: DC
  Administered 2017-09-28: 20 mg via ORAL
  Filled 2017-09-26 (×2): qty 2

## 2017-09-26 MED ORDER — ONDANSETRON HCL 4 MG/2ML IJ SOLN
4.0000 mg | Freq: Once | INTRAMUSCULAR | Status: AC
  Administered 2017-09-26: 4 mg via INTRAVENOUS
  Filled 2017-09-26: qty 2

## 2017-09-26 MED ORDER — HYDROCODONE-ACETAMINOPHEN 5-325 MG PO TABS
1.0000 | ORAL_TABLET | ORAL | Status: DC | PRN
  Administered 2017-09-27: 1 via ORAL
  Filled 2017-09-26: qty 1

## 2017-09-26 MED ORDER — IOPAMIDOL (ISOVUE-300) INJECTION 61%
100.0000 mL | Freq: Once | INTRAVENOUS | Status: AC | PRN
  Administered 2017-09-26: 100 mL via INTRAVENOUS

## 2017-09-26 MED ORDER — HYDROCHLOROTHIAZIDE 12.5 MG PO CAPS
12.5000 mg | ORAL_CAPSULE | Freq: Every day | ORAL | Status: DC
  Administered 2017-09-28: 12.5 mg via ORAL
  Filled 2017-09-26 (×2): qty 1

## 2017-09-26 MED ORDER — SACCHAROMYCES BOULARDII 250 MG PO CAPS
250.0000 mg | ORAL_CAPSULE | Freq: Two times a day (BID) | ORAL | Status: DC
  Administered 2017-09-26 – 2017-09-29 (×5): 250 mg via ORAL
  Filled 2017-09-26 (×7): qty 1

## 2017-09-26 MED ORDER — INSULIN ASPART 100 UNIT/ML ~~LOC~~ SOLN: 0.0000 [IU] | Freq: Three times a day (TID) | SUBCUTANEOUS | Status: DC

## 2017-09-26 MED ORDER — ALBUTEROL SULFATE (2.5 MG/3ML) 0.083% IN NEBU: 3.0000 mL | INHALATION_SOLUTION | RESPIRATORY_TRACT | Status: DC | PRN

## 2017-09-26 MED ORDER — PIPERACILLIN-TAZOBACTAM 3.375 G IVPB
3.3750 g | Freq: Three times a day (TID) | INTRAVENOUS | Status: DC
  Administered 2017-09-27 (×2): 3.375 g via INTRAVENOUS
  Filled 2017-09-26 (×2): qty 50

## 2017-09-26 MED ORDER — PANTOPRAZOLE SODIUM 40 MG PO TBEC
40.0000 mg | DELAYED_RELEASE_TABLET | Freq: Every day | ORAL | Status: DC
  Administered 2017-09-28 – 2017-09-29 (×2): 40 mg via ORAL
  Filled 2017-09-26 (×2): qty 1

## 2017-09-26 MED ORDER — ALPRAZOLAM 0.25 MG PO TABS: 0.2500 mg | ORAL_TABLET | Freq: Two times a day (BID) | ORAL | Status: DC | PRN

## 2017-09-26 MED ORDER — ACETAMINOPHEN 650 MG RE SUPP: 650.0000 mg | Freq: Four times a day (QID) | RECTAL | Status: DC | PRN

## 2017-09-26 MED ORDER — DULOXETINE HCL 60 MG PO CPEP
60.0000 mg | ORAL_CAPSULE | Freq: Two times a day (BID) | ORAL | Status: DC
  Administered 2017-09-26 – 2017-09-29 (×6): 60 mg via ORAL
  Filled 2017-09-26 (×4): qty 2
  Filled 2017-09-26: qty 1
  Filled 2017-09-26: qty 2
  Filled 2017-09-26: qty 1

## 2017-09-26 MED ORDER — ACETAMINOPHEN 325 MG PO TABS: 650.0000 mg | ORAL_TABLET | Freq: Four times a day (QID) | ORAL | Status: DC | PRN

## 2017-09-26 MED ORDER — LISINOPRIL-HYDROCHLOROTHIAZIDE 20-12.5 MG PO TABS: 1.0000 | ORAL_TABLET | Freq: Every day | ORAL | Status: DC

## 2017-09-26 MED ORDER — ADULT MULTIVITAMIN W/MINERALS CH
1.0000 | ORAL_TABLET | Freq: Every day | ORAL | Status: DC
  Administered 2017-09-28 – 2017-09-29 (×2): 1 via ORAL
  Filled 2017-09-26 (×2): qty 1

## 2017-09-26 MED ORDER — LEVOTHYROXINE SODIUM 100 MCG PO TABS
100.0000 ug | ORAL_TABLET | Freq: Every day | ORAL | Status: DC
  Administered 2017-09-28 – 2017-09-29 (×2): 100 ug via ORAL
  Filled 2017-09-26: qty 1
  Filled 2017-09-26: qty 2

## 2017-09-26 MED ORDER — TRAZODONE HCL 50 MG PO TABS
100.0000 mg | ORAL_TABLET | Freq: Every evening | ORAL | Status: DC | PRN
  Administered 2017-09-27 – 2017-09-29 (×3): 100 mg via ORAL
  Filled 2017-09-26 (×4): qty 2

## 2017-09-26 MED ORDER — SODIUM CHLORIDE 0.9 % IV BOLUS (SEPSIS)
500.0000 mL | Freq: Once | INTRAVENOUS | Status: AC
  Administered 2017-09-26: 500 mL via INTRAVENOUS

## 2017-09-26 MED ORDER — VALACYCLOVIR HCL 500 MG PO TABS: 2000.0000 mg | ORAL_TABLET | Freq: Two times a day (BID) | ORAL | Status: DC | PRN

## 2017-09-26 MED ORDER — MONTELUKAST SODIUM 10 MG PO TABS
10.0000 mg | ORAL_TABLET | Freq: Every day | ORAL | Status: DC
  Administered 2017-09-26 – 2017-09-28 (×3): 10 mg via ORAL
  Filled 2017-09-26 (×3): qty 1

## 2017-09-26 MED ORDER — ASPIRIN EC 81 MG PO TBEC
81.0000 mg | DELAYED_RELEASE_TABLET | Freq: Every day | ORAL | Status: DC
  Administered 2017-09-28 – 2017-09-29 (×2): 81 mg via ORAL
  Filled 2017-09-26 (×2): qty 1

## 2017-09-26 NOTE — ED Notes (Signed)
Dr. Kim in room 

## 2017-09-26 NOTE — Progress Notes (Signed)
ANTIBIOTIC CONSULT NOTE - INITIAL  Pharmacy Consult for zosyn Indication: intra abdominal infection  No Known Allergies  Patient Measurements:    Intake/Output from previous day: No intake/output data recorded. Intake/Output from this shift: No intake/output data recorded.  Labs: Recent Labs    09/26/17 1726  WBC 17.9*  HGB 15.3*  PLT 361  CREATININE 0.61   Estimated Creatinine Clearance: 107.2 mL/min (by C-G formula based on SCr of 0.61 mg/dL). No results for input(s): VANCOTROUGH, VANCOPEAK, VANCORANDOM, GENTTROUGH, GENTPEAK, GENTRANDOM, TOBRATROUGH, TOBRAPEAK, TOBRARND, AMIKACINPEAK, AMIKACINTROU, AMIKACIN in the last 72 hours.   Microbiology: No results found for this or any previous visit (from the past 720 hour(s)).  Medical History: Past Medical History:  Diagnosis Date  . Depression   . Diabetes mellitus without complication ([place])   . Diverticulitis   . GERD (gastroesophageal reflux disease)   . Hypertension   . IBS (irritable bowel syndrome)   . Pneumonia   . Thyroid disease     Medications:   see medication history Assessment: 56 yo lady to start zosyn for intra abdominal infection.  Zosyn 3.375 gm given in ED  Goal of Therapy:  Eradication of infection  Plan:  Cont zosyn 3.375 gm IV q8 hours F/u cultures and clinical course  Shalev Helminiak Poteet 09/26/2017,10:33 PM

## 2017-09-26 NOTE — ED Provider Notes (Signed)
[facility] Provider Note   CSN: [id number] Arrival date & time: 09/26/17  1706     History   Chief Complaint Chief Complaint  Patient presents with  . Abdominal Pain    HPI Kristin Bailey is a 56 y.o. female.  HPI With several weeks of diffuse abdominal pain, postprandial nausea and vomiting.  Recently diagnosed with diverticulitis and completed a course of antibiotics.  Was following up with Dr. Melony Overly.  Continued to have abdominal pain, nausea and vomiting.  States she is been constipated.  No blood in stool.  Was seen by Dr. Collene Mares and referred to the emergency department for a repeat CT of the abdomen. Past Medical History:  Diagnosis Date  . Depression   . Diabetes mellitus without complication ([place])   . Diverticulitis   . GERD (gastroesophageal reflux disease)   . Hypertension   . IBS (irritable bowel syndrome)   . Pneumonia   . Thyroid disease     Patient Active Problem List   Diagnosis Date Noted  . Diverticulitis 09/26/2017  . Hyponatremia 09/26/2017  . Noninfectious gastroenteritis 11/28/2016  . Enteritis 11/04/2016  . Lactic acidosis 11/04/2016  . Enterocolitis 03/17/2016  . Gastroenteritis, infectious 03/17/2016  . Leukocytosis 11/28/2015  . Acute respiratory failure with hypoxia ([place]) 11/28/2015  . Controlled type 2 diabetes mellitus without complication, without long-term current use of insulin ([place]) 11/28/2015  . UTI (lower urinary tract infection) 11/27/2015  . Colitis 11/27/2015  . AKI (acute kidney injury) ([place]) 11/27/2015    Past Surgical History:  Procedure Laterality Date  . CARPAL TUNNEL RELEASE    . CESAREAN SECTION    . CHOLECYSTECTOMY    . CYST REMOVAL HAND    . FRACTURE SURGERY     rt ankle   . KNEE SURGERY     meniscus repair  . TUBAL LIGATION      OB History    Gravida Para Term Preterm AB Living   1 1 1     1    SAB TAB Ectopic Multiple Live Births                   Home Medications    Prior to  Admission medications   Medication Sig Start Date End Date Taking? Authorizing Provider  acetaminophen (TYLENOL 8 HOUR ARTHRITIS PAIN) 650 MG CR tablet Take 1,300 mg by mouth every morning. *May take additional as needed   Yes [provider]  albuterol (PROVENTIL HFA;VENTOLIN HFA) 108 (90 BASE) MCG/ACT inhaler Inhale 2 puffs into the lungs every 4 (four) hours as needed for wheezing or shortness of breath.   Yes [provider]  ALPRAZolam (XANAX) 0.25 MG tablet Take 0.25 mg by mouth 2 (two) times daily as needed for anxiety.   Yes [provider]  Ascorbic Acid (VITAMIN C) 1000 MG tablet Take 1,000 mg 2 (two) times daily by mouth.   Yes [provider]  aspirin EC 81 MG tablet Take 81 mg by mouth daily.   Yes [provider]  Dulaglutide (TRULICITY) 4.17 EY/8.1KG SOPN Inject 0.5 mLs into the skin every Monday. On  Monday's.    Yes [provider]  DULoxetine (CYMBALTA) 60 MG capsule Take 1 capsule (60 mg total) by mouth every morning. Patient taking differently: Take 60 mg 2 (two) times daily by mouth.  09/06/17  Yes Hisada, Elie Goody, MD  glipiZIDE (GLUCOTROL) 5 MG tablet Take 5 mg by mouth 2 (two) times daily.  Yes [provider]  HYDROcodone-acetaminophen (NORCO/VICODIN) 5-325 MG tablet Take 1 tablet by mouth every 4 (four) hours as needed. 09/07/17  Yes Tanna Furry, MD  levothyroxine (SYNTHROID, LEVOTHROID) 100 MCG tablet Take 100 mcg daily by mouth.  08/16/17  Yes [provider]  lisinopril-hydrochlorothiazide (PRINZIDE,ZESTORETIC) 20-12.5 MG tablet TAKE ONE TABLET BY MOUTH DAILY 01/03/17 12/28/17 Yes [provider]  montelukast (SINGULAIR) 10 MG tablet Take 10 mg by mouth at bedtime.   Yes [provider]  Multiple Vitamin (MULTIVITAMIN WITH MINERALS) TABS tablet Take 1 tablet by mouth daily.   Yes [provider]  omeprazole (PRILOSEC OTC) 20 MG tablet Take 1 tablet (20 mg total) by mouth  daily. Patient taking differently: Take 20 mg by mouth every other day. BEDTIME 08/30/15  Yes Carmin Muskrat, MD  ondansetron (ZOFRAN) 4 MG tablet Take 1 tablet (4 mg total) by mouth every 8 (eight) hours as needed for nausea or vomiting. 11/16/16  Yes Setzer, Rona Ravens, NP  [place] Patient is taking CBD Oil - 1-2 drops under tongue, patient will take when her tummy hurts. PRN   Yes [provider]  traZODone (DESYREL) 100 MG tablet 100-150 mg at night as needed for sleep Patient taking differently: Take 100-150 mg by mouth at bedtime as needed for sleep. 100-150 mg at night as needed for sleep 09/06/17  Yes Hisada, Elie Goody, MD  ciprofloxacin (CIPRO) 500 MG tablet Take 1 tablet (500 mg total) by mouth 2 (two) times daily. Patient not taking: Reported on 09/26/2017 09/07/17   Tanna Furry, MD  metroNIDAZOLE (FLAGYL) 500 MG tablet Take 1 tablet (500 mg total) by mouth 2 (two) times daily. Patient not taking: Reported on 09/26/2017 09/07/17   Tanna Furry, MD  valACYclovir (VALTREX) 1000 MG tablet Take 2 g 2 (two) times daily by mouth. 09/23/17   [provider]    Family History Family History  Problem Relation Age of Onset  . Cancer Mother        colon cancer age74  . Hypertension Mother   . Stroke Mother   . Diabetes Mother   . Non-Hodgkin's lymphoma Father   . Hypertension Father   . Diabetes Father   . Stroke Maternal Grandmother   . Kidney disease Paternal Aunt   . Cancer Paternal Aunt   . Kidney disease Paternal Uncle   . Cancer Paternal Uncle     Social History Social History   Tobacco Use  . Smoking status: Never Smoker  . Smokeless tobacco: Never Used  Substance Use Topics  . Alcohol use: No  . Drug use: No     Allergies   Patient has no known allergies.   Review of Systems Review of Systems  Constitutional: Negative for chills and fever.  Respiratory: Negative for cough and shortness of breath.   Cardiovascular: Negative for  chest pain.  Gastrointestinal: Positive for abdominal pain, constipation, nausea and vomiting. Negative for blood in stool.  Genitourinary: Negative for difficulty urinating, dysuria, flank pain and frequency.  Musculoskeletal: Negative for back pain, myalgias, neck pain and neck stiffness.  Skin: Negative for rash and wound.  Neurological: Negative for dizziness, weakness, light-headedness, numbness and headaches.  All other systems reviewed and are negative.    Physical Exam Updated Vital Signs BP (!) 93/39 (BP Location: Left Arm)   Pulse 98   Temp 98.8 F (37.1 C) (Oral)   Resp 16   Ht 5\' 6"  (1.676 m)   LMP 03/27/2014 Comment: irregular  SpO2 98%   BMI 44.42 kg/m   Physical Exam  Constitutional: She is oriented to person, place, and time. She appears well-developed and well-nourished.  Non-toxic appearance. She does not appear ill. No distress.  HENT:  Head: Normocephalic and atraumatic.  Mouth/Throat: Oropharynx is clear and moist.  Eyes: EOM are normal. Pupils are equal, round, and reactive to light.  Neck: Normal range of motion. Neck supple.  Cardiovascular: Normal rate and regular rhythm.  Pulmonary/Chest: Effort normal and breath sounds normal.  Abdominal: Soft. Normal appearance and bowel sounds are normal. She exhibits no distension and no mass. There is generalized tenderness. There is no rigidity, no rebound and no guarding.  Musculoskeletal: Normal range of motion. She exhibits no edema or tenderness.  Neurological: She is alert and oriented to person, place, and time.  Skin: Skin is warm and dry. Capillary refill takes less than 2 seconds. No rash noted. No erythema.  Psychiatric: She has a normal mood and affect. Her behavior is normal.  Nursing note and vitals reviewed.    ED Treatments / Results  Labs (all labs ordered are listed, but only abnormal results are displayed) Labs Reviewed  COMPREHENSIVE METABOLIC PANEL - Abnormal; Notable for the following  components:      Result Value   Sodium 133 (*)    Chloride 96 (*)    Glucose, Bld 106 (*)    Total Protein 8.4 (*)    All other components within normal limits  CBC - Abnormal; Notable for the following components:   WBC 17.9 (*)    RBC 5.23 (*)    Hemoglobin 15.3 (*)    All other components within normal limits  LIPASE, BLOOD  URINALYSIS, ROUTINE W REFLEX MICROSCOPIC  HIV ANTIBODY (ROUTINE TESTING)  COMPREHENSIVE METABOLIC PANEL  CBC  TSH    EKG  EKG Interpretation None       Radiology Ct Abdomen Pelvis W Contrast  Result Date: 09/26/2017 CLINICAL DATA:  Nausea, and vomiting, lower abdomen pain for 3 weeks. EXAM: CT ABDOMEN AND PELVIS WITH CONTRAST TECHNIQUE: Multidetector CT imaging of the abdomen and pelvis was performed using the standard protocol following bolus administration of intravenous contrast. CONTRAST:  153mL ISOVUE-300 IOPAMIDOL (ISOVUE-300) INJECTION 61% COMPARISON:  September 07, 2017 FINDINGS: Lower chest: No acute abnormality. Hepatobiliary: No focal liver abnormality is seen. Status post cholecystectomy. No biliary dilatation. There is diffuse low density of the liver. Pancreas: Unremarkable. No pancreatic ductal dilatation or surrounding inflammatory changes. Spleen: Normal in size without focal abnormality. Adrenals/Urinary Tract: Adrenal glands are unremarkable. Kidneys are normal, without renal calculi, focal lesion, or hydronephrosis. Bladder is unremarkable. Stomach/Bowel: The stomach is normal. There is no small bowel obstruction. There is diverticulosis of the colon. There is question diffuse thick wall sigmoid in a decompressed sigmoid. The appendix is normal. Vascular/Lymphatic: No significant vascular findings are present. No enlarged abdominal or pelvic lymph nodes. Reproductive: Status post hysterectomy. The bilateral adnexa are not changed compared prior exam. Other: None. Musculoskeletal: Degenerative joint changes of the spine are noted. IMPRESSION: No  acute abnormality is identified in the abdomen. Question diffuse bowel wall thickening of the sigmoid colon in a decompressed sigmoid. Recommend further evaluation with barium enema on outpatient basis to exclude bowel wall thickening. Fatty infiltration of liver. Prior cholecystectomy and hysterectomy. Electronically Signed   By: Abelardo Diesel M.D.   On: 09/26/2017 18:55    Procedures Procedures (including critical care time)  Medications Ordered in ED Medications  albuterol (PROVENTIL) (2.5 MG/3ML) 0.083%  nebulizer solution 3 mL (not administered)  ALPRAZolam (XANAX) tablet 0.25 mg (not administered)  aspirin EC tablet 81 mg (not administered)  DULoxetine (CYMBALTA) DR capsule 60 mg (not administered)  HYDROcodone-acetaminophen (NORCO/VICODIN) 5-325 MG per tablet 1 tablet (not administered)  levothyroxine (SYNTHROID, LEVOTHROID) tablet 100 mcg (not administered)  montelukast (SINGULAIR) tablet 10 mg (not administered)  multivitamin with minerals tablet 1 tablet (not administered)  traZODone (DESYREL) tablet 100-150 mg (not administered)  valACYclovir (VALTREX) tablet 2,000 mg (not administered)  pantoprazole (PROTONIX) EC tablet 40 mg (not administered)  ondansetron (ZOFRAN) injection 4 mg (not administered)  insulin aspart (novoLOG) injection 0-9 Units (not administered)  0.9 %  sodium chloride infusion (not administered)  acetaminophen (TYLENOL) tablet 650 mg (not administered)    Or  acetaminophen (TYLENOL) suppository 650 mg (not administered)  saccharomyces boulardii (FLORASTOR) capsule 250 mg (not administered)  piperacillin-tazobactam (ZOSYN) IVPB 3.375 g (not administered)  lisinopril (PRINIVIL,ZESTRIL) tablet 20 mg (not administered)    And  hydrochlorothiazide (MICROZIDE) capsule 12.5 mg (not administered)  iopamidol (ISOVUE-300) 61 % injection 100 mL (100 mLs Intravenous Contrast Given 09/26/17 1828)  morphine 4 MG/ML injection 4 mg (4 mg Intravenous Given 09/26/17 2033)    ondansetron (ZOFRAN) injection 4 mg (4 mg Intravenous Given 09/26/17 2035)  piperacillin-tazobactam (ZOSYN) IVPB 3.375 g (0 g Intravenous Stopped 09/26/17 2223)  sodium chloride 0.9 % bolus 500 mL (500 mLs Intravenous Transfusing/Transfer 09/26/17 2223)     Initial Impression / Assessment and Plan / ED Course  I have reviewed the triage vital signs and the nursing notes.  Pertinent labs & imaging results that were available during my care of the patient were reviewed by me and considered in my medical decision making (see chart for details).     Questionable sigmoid bowel wall thickening on CT. White count remains elevated.  Will discuss disposition with Dr. Joya Gaskins Discussed with Dr. Deirdre Evener.  Commence antibiotics and admission.  Will likely do sigmoidoscopy tomorrow.  Recommends keeping the patient n.p.o. after midnight.  Discussed with Dr. Maudie Mercury.  Will see patient in the emergency department and admit. Final Clinical Impressions(s) / ED Diagnoses   Final diagnoses:  Abdominal pain, left lower quadrant    ED Discharge Orders    None       Julianne Rice, MD 09/26/17 2300

## 2017-09-26 NOTE — ED Notes (Signed)
EDP made aware of pt's request for pain and nausea medication

## 2017-09-26 NOTE — H&P (Signed)
TRH H&P   Patient Demographics:    Kristin Bailey, is a 56 y.o. female  MRN: [id number]   DOB - January 12, 1961  Admit Date - 09/26/2017  Outpatient Primary MD for the patient is Rory Percy, MD  Referring MD/NP/PA: Julianne Rice  Outpatient Specialists:  Dr. Laural Golden  Patient coming from: home=> Dr. Laural Golden office=> ER  Chief Complaint  Patient presents with  . Abdominal Pain      HPI:    Kristin Bailey  is a 56 y.o. female, w Dm2, Jerrye Bushy,  irritable bowel, diverticulitis, apparently c/o diverticulitis about 3 weeks ago which she was treated with cipro and flagyl.  Pt felt better for 1 week and then felt sick with nausea, and intermittent LLQ pain subsequently.  Today she had "dry heaving"  ie n/v, and LLQ pain and presented to Dr. Otelia Limes office.  Pt was sent to ED for CT scan.  In ED, CT scan showed thickening of the sigmoid colon.  Dr. Laural Golden requested admission with iv Abx, as well as NPO after MN for possible sigmoidoscopy in am.      Review of systems:    In addition to the HPI above,  No Fever-chills, No Headache, No changes with Vision or hearing, No problems swallowing food or Liquids, No Chest pain, Cough or Shortness of Breath, +constipation No Blood in stool or Urine, No dysuria, No new skin rashes or bruises, No new joints pains-aches,  No new weakness, tingling, numbness in any extremity, No recent weight gain or loss, No polyuria, polydypsia or polyphagia, No significant Mental Stressors.  A full 10 point Review of Systems was done, except as stated above, all other Review of Systems were negative.   With Past History of the following :    Past Medical History:  Diagnosis Date  . Depression   . Diabetes mellitus without complication ([place])   . Diverticulitis   . GERD (gastroesophageal reflux disease)   . Hypertension   . IBS (irritable bowel  syndrome)   . Pneumonia   . Thyroid disease       Past Surgical History:  Procedure Laterality Date  . CARPAL TUNNEL RELEASE    . CESAREAN SECTION    . CHOLECYSTECTOMY    . CYST REMOVAL HAND    . FRACTURE SURGERY     rt ankle   . KNEE SURGERY     meniscus repair  . TUBAL LIGATION        Social History:     Social History   Tobacco Use  . Smoking status: Never Smoker  . Smokeless tobacco: Never Used  Substance Use Topics  . Alcohol use: No     Lives - at home  Mobility - walks by self\   Family History :     Family History  Problem Relation Age of Onset  . Cancer Mother  colon cancer age74  . Hypertension Mother   . Stroke Mother   . Diabetes Mother   . Non-Hodgkin's lymphoma Father   . Hypertension Father   . Diabetes Father   . Stroke Maternal Grandmother   . Kidney disease Paternal Aunt   . Cancer Paternal Aunt   . Kidney disease Paternal Uncle   . Cancer Paternal Uncle       Home Medications:   Prior to Admission medications   Medication Sig Start Date End Date Taking? Authorizing Provider  acetaminophen (TYLENOL 8 HOUR ARTHRITIS PAIN) 650 MG CR tablet Take 1,300 mg by mouth every morning. *May take additional as needed   Yes [provider]  albuterol (PROVENTIL HFA;VENTOLIN HFA) 108 (90 BASE) MCG/ACT inhaler Inhale 2 puffs into the lungs every 4 (four) hours as needed for wheezing or shortness of breath.   Yes [provider]  ALPRAZolam (XANAX) 0.25 MG tablet Take 0.25 mg by mouth 2 (two) times daily as needed for anxiety.   Yes [provider]  Ascorbic Acid (VITAMIN C) 1000 MG tablet Take 1,000 mg 2 (two) times daily by mouth.   Yes [provider]  aspirin EC 81 MG tablet Take 81 mg by mouth daily.   Yes [provider]  Dulaglutide (TRULICITY) 7.35 HG/9.9ME SOPN Inject 0.5 mLs into the skin every Monday. On  Monday's.    Yes [provider]  DULoxetine (CYMBALTA) 60 MG capsule Take  1 capsule (60 mg total) by mouth every morning. Patient taking differently: Take 60 mg 2 (two) times daily by mouth.  09/06/17  Yes Hisada, Elie Goody, MD  glipiZIDE (GLUCOTROL) 5 MG tablet Take 5 mg by mouth 2 (two) times daily.    Yes [provider]  HYDROcodone-acetaminophen (NORCO/VICODIN) 5-325 MG tablet Take 1 tablet by mouth every 4 (four) hours as needed. 09/07/17  Yes Tanna Furry, MD  levothyroxine (SYNTHROID, LEVOTHROID) 100 MCG tablet Take 100 mcg daily by mouth.  08/16/17  Yes [provider]  lisinopril-hydrochlorothiazide (PRINZIDE,ZESTORETIC) 20-12.5 MG tablet TAKE ONE TABLET BY MOUTH DAILY 01/03/17 12/28/17 Yes [provider]  montelukast (SINGULAIR) 10 MG tablet Take 10 mg by mouth at bedtime.   Yes [provider]  Multiple Vitamin (MULTIVITAMIN WITH MINERALS) TABS tablet Take 1 tablet by mouth daily.   Yes [provider]  omeprazole (PRILOSEC OTC) 20 MG tablet Take 1 tablet (20 mg total) by mouth daily. Patient taking differently: Take 20 mg by mouth every other day. BEDTIME 08/30/15  Yes Carmin Muskrat, MD  ondansetron (ZOFRAN) 4 MG tablet Take 1 tablet (4 mg total) by mouth every 8 (eight) hours as needed for nausea or vomiting. 11/16/16  Yes Setzer, Rona Ravens, NP  [place] Patient is taking CBD Oil - 1-2 drops under tongue, patient will take when her tummy hurts. PRN   Yes [provider]  traZODone (DESYREL) 100 MG tablet 100-150 mg at night as needed for sleep Patient taking differently: Take 100-150 mg by mouth at bedtime as needed for sleep. 100-150 mg at night as needed for sleep 09/06/17  Yes Hisada, Elie Goody, MD  ciprofloxacin (CIPRO) 500 MG tablet Take 1 tablet (500 mg total) by mouth 2 (two) times daily. Patient not taking: Reported on 09/26/2017 09/07/17   Tanna Furry, MD  metroNIDAZOLE (FLAGYL) 500 MG tablet Take 1 tablet (500 mg total) by mouth 2 (two) times daily. Patient not taking: Reported on  09/26/2017 09/07/17   Tanna Furry, MD  valACYclovir (  VALTREX) 1000 MG tablet Take 2 g 2 (two) times daily by mouth. 09/23/17   [provider]     Allergies:    No Known Allergies   Physical Exam:   Vitals  Blood pressure 112/76, pulse 91, temperature 98.7 F (37.1 C), temperature source Oral, resp. rate 16, last menstrual period 03/27/2014, SpO2 98 %.   1. General lying in bed in NAD,   2. Normal affect and insight, Not Suicidal or Homicidal, Awake Alert, Oriented X 3.  3. No F.N deficits, ALL C.Nerves Intact, Strength 5/5 all 4 extremities, Sensation intact all 4 extremities, Plantars down going.  4. Ears and Eyes appear Normal, Conjunctivae clear, PERRLA. Moist Oral Mucosa.  5. Supple Neck, No JVD, No cervical lymphadenopathy appriciated, No Carotid Bruits.  6. Symmetrical Chest wall movement, Good air movement bilaterally, CTAB.  7. RRR, No Gallops, Rubs or Murmurs, No Parasternal Heave.  8. Positive Bowel Sounds, Abdomen Soft, No tenderness, No organomegaly appriciated,No rebound -guarding or rigidity.  9.  No Cyanosis, Normal Skin Turgor, No Skin Rash or Bruise.  10. Good muscle tone,  joints appear normal , no effusions, Normal ROM.  11. No Palpable Lymph Nodes in Neck or Axillae     Data Review:    CBC Recent Labs  Lab 09/26/17 1726  WBC 17.9*  HGB 15.3*  HCT 44.8  PLT 361  MCV 85.7  MCH 29.3  MCHC 34.2  RDW 14.4   ------------------------------------------------------------------------------------------------------------------  Chemistries  Recent Labs  Lab 09/26/17 1726  NA 133*  K 4.2  CL 96*  CO2 26  GLUCOSE 106*  BUN 18  CREATININE 0.61  CALCIUM 9.8  AST 21  ALT 26  ALKPHOS 83  BILITOT 0.7   ------------------------------------------------------------------------------------------------------------------ estimated creatinine clearance is 107.2 mL/min (by C-G formula based on SCr of 0.61  mg/dL). ------------------------------------------------------------------------------------------------------------------ No results for input(s): TSH, T4TOTAL, T3FREE, THYROIDAB in the last 72 hours.  Invalid input(s): FREET3  Coagulation profile No results for input(s): INR, PROTIME in the last 168 hours. ------------------------------------------------------------------------------------------------------------------- No results for input(s): DDIMER in the last 72 hours. -------------------------------------------------------------------------------------------------------------------  Cardiac Enzymes No results for input(s): CKMB, TROPONINI, MYOGLOBIN in the last 168 hours.  Invalid input(s): CK ------------------------------------------------------------------------------------------------------------------ No results found for: BNP   ---------------------------------------------------------------------------------------------------------------  Urinalysis    Component Value Date/Time   COLORURINE YELLOW 09/26/2017 [place] 09/26/2017 1716   LABSPEC 1.020 09/26/2017 1716   PHURINE 5.0 09/26/2017 1716   GLUCOSEU NEGATIVE 09/26/2017 [place] 09/26/2017 [place] 09/26/2017 1716   KETONESUR NEGATIVE 09/26/2017 1716   PROTEINUR NEGATIVE 09/26/2017 1716   UROBILINOGEN 0.2 08/30/2015 1420   NITRITE NEGATIVE 09/26/2017 [place] 09/26/2017 1716    ----------------------------------------------------------------------------------------------------------------   Imaging Results:    Ct Abdomen Pelvis W Contrast  Result Date: 09/26/2017 CLINICAL DATA:  Nausea, and vomiting, lower abdomen pain for 3 weeks. EXAM: CT ABDOMEN AND PELVIS WITH CONTRAST TECHNIQUE: Multidetector CT imaging of the abdomen and pelvis was performed using the standard protocol following bolus administration of intravenous contrast.  CONTRAST:  173mL ISOVUE-300 IOPAMIDOL (ISOVUE-300) INJECTION 61% COMPARISON:  September 07, 2017 FINDINGS: Lower chest: No acute abnormality. Hepatobiliary: No focal liver abnormality is seen. Status post cholecystectomy. No biliary dilatation. There is diffuse low density of the liver. Pancreas: Unremarkable. No pancreatic ductal dilatation or surrounding inflammatory changes. Spleen: Normal in size without focal abnormality. Adrenals/Urinary Tract: Adrenal glands are unremarkable. Kidneys are normal, without renal calculi, focal lesion, or  hydronephrosis. Bladder is unremarkable. Stomach/Bowel: The stomach is normal. There is no small bowel obstruction. There is diverticulosis of the colon. There is question diffuse thick wall sigmoid in a decompressed sigmoid. The appendix is normal. Vascular/Lymphatic: No significant vascular findings are present. No enlarged abdominal or pelvic lymph nodes. Reproductive: Status post hysterectomy. The bilateral adnexa are not changed compared prior exam. Other: None. Musculoskeletal: Degenerative joint changes of the spine are noted. IMPRESSION: No acute abnormality is identified in the abdomen. Question diffuse bowel wall thickening of the sigmoid colon in a decompressed sigmoid. Recommend further evaluation with barium enema on outpatient basis to exclude bowel wall thickening. Fatty infiltration of liver. Prior cholecystectomy and hysterectomy. Electronically Signed   By: Abelardo Diesel M.D.   On: 09/26/2017 18:55      Assessment & Plan:    Active Problems:   Leukocytosis   Controlled type 2 diabetes mellitus without complication, without long-term current use of insulin (HCC)   Diverticulitis   Hyponatremia   Sigmoid colon thickening? Diverticulitis Zosyn iv pharmacy to dose NPO after MN Ns iv Gi consult  Hyponatremia Hydrate with ns iv Check cmp in am  Gerd Cont PPI  Dm2 fsbs q4h , ISS  Hypertension Cont prinzide  Anxiety Cont xanax Cont  cymbalta  Hypothyroidism Cont levothyroxine     DVT Prophylaxis SCDs  AM Labs Ordered, also please review Full Orders  Family Communication: Admission, patients condition and plan of care including tests being ordered have been discussed with the patient  who indicate understanding and agree with the plan and Code Status.  Code Status FULL CODE  Likely DC to  home  Condition GUARDED    Consults called: GI consult by ED  Admission status: inpatient  Time spent in minutes : 45    Jani Gravel M.D on 09/26/2017 at 10:11 PM  Between 7am to 7pm - Pager - [phone]. After 7pm go to [email or web address] - password [facility]  [facility] - Office  [phone]

## 2017-09-26 NOTE — Progress Notes (Signed)
Presenting complaint;  Abdominal pain nausea and vomiting.  Database and subjective:  Patient is 56 year old Caucasian female who has been having episodic abdominal pain nausea vomiting and diarrhea since January 2017.  Between these spells she feels fine.  She has been hospitalized a few times and workup has been negative.  She has undergone multiple CTs.  She had a EGD back in May 2017 revealing fundic gland polyps and duodenal biopsy was negative. Abdominopelvic CT in December 2017 revealed scattered wall thickening to loops of small bowel as well as distal half of colon associated with mesenteric edema consistent with enteritis.  She underwent given Stool in February this year and no abnormality was noted to small bowel.  Is also screen for porphyria and the testing was negative.  Last seen in May 2018 and she had gone 4 months without a spell. Patient developed abdominal pain with nausea and vomiting and was seen in emergency room on 09/07/2017 and felt to have sigmoid diverticulitis based on CT.  She was discharged on Cipro and metronidazole. She states she started to feel better after a few days of taking antibiotic but her symptoms free did not last long.  She has continued to have intermittent abdominal pain nausea heaving and vomiting.  She was sick yesterday morning but felt well enough to eat a slight of pizza last night.  She woke up sick today.  She has vomited once.  She has been heaving.  She complains of pain across mid and lower abdomen.  She describes it to be sharp and crampy.  She has been constipated over the last few days.  She had a BM yesterday after straining and taking MiraLAX.  She has been passing flatus today.  She denies hematuria dysuria or vaginal bleeding.  She has not been febrile.   Current Medications: Outpatient Encounter Medications as of 09/26/2017  Medication Sig  . acetaminophen (TYLENOL 8 HOUR ARTHRITIS PAIN) 650 MG CR tablet Take 1,300 mg by mouth every  morning. *May take additional as needed  . albuterol (PROVENTIL HFA;VENTOLIN HFA) 108 (90 BASE) MCG/ACT inhaler Inhale 2 puffs into the lungs every 4 (four) hours as needed for wheezing or shortness of breath.  . ALPRAZolam (XANAX) 0.25 MG tablet Take 0.25 mg by mouth 2 (two) times daily as needed for anxiety.  . Ascorbic Acid (VITAMIN C) 1000 MG tablet Take 1,000 mg 2 (two) times daily by mouth.  Marland Kitchen aspirin EC 81 MG tablet Take 81 mg by mouth daily.  . Dulaglutide (TRULICITY) 3.26 ZT/2.4PY SOPN Inject 0.5 mLs into the skin every Monday. On  Monday's.   . DULoxetine (CYMBALTA) 60 MG capsule Take 1 capsule (60 mg total) by mouth every morning.  Marland Kitchen glipiZIDE (GLUCOTROL) 5 MG tablet Take 5 mg by mouth 2 (two) times daily.   Marland Kitchen HYDROcodone-acetaminophen (NORCO/VICODIN) 5-325 MG tablet Take 1 tablet by mouth every 4 (four) hours as needed.  Marland Kitchen levothyroxine (SYNTHROID, LEVOTHROID) 100 MCG tablet Take 100 mcg daily by mouth.   Marland Kitchen lisinopril-hydrochlorothiazide (PRINZIDE,ZESTORETIC) 20-12.5 MG tablet TAKE ONE TABLET BY MOUTH DAILY  . montelukast (SINGULAIR) 10 MG tablet Take 10 mg by mouth at bedtime.  . Multiple Vitamin (MULTIVITAMIN WITH MINERALS) TABS tablet Take 1 tablet by mouth daily.  Marland Kitchen omeprazole (PRILOSEC OTC) 20 MG tablet Take 1 tablet (20 mg total) by mouth daily. (Patient taking differently: Take 20 mg by mouth every other day. BEDTIME)  . ondansetron (ZOFRAN) 4 MG tablet Take 1 tablet (4 mg total) by mouth  every 8 (eight) hours as needed for nausea or vomiting.  Marland Kitchen OVER THE COUNTER MEDICATION Patient is taking CBD Oil - 1-2 drops under tongue, patient will take when her tummy hurts. PRN  . traZODone (DESYREL) 100 MG tablet 100-150 mg at night as needed for sleep (Patient taking differently: Take 100-150 mg by mouth at bedtime as needed for sleep. 100-150 mg at night as needed for sleep)  . [DISCONTINUED] ondansetron (ZOFRAN ODT) 4 MG disintegrating tablet Take 1 tablet (4 mg total) by mouth every 8  (eight) hours as needed for nausea.  . ciprofloxacin (CIPRO) 500 MG tablet Take 1 tablet (500 mg total) by mouth 2 (two) times daily. (Patient not taking: Reported on 09/26/2017)  . metroNIDAZOLE (FLAGYL) 500 MG tablet Take 1 tablet (500 mg total) by mouth 2 (two) times daily. (Patient not taking: Reported on 09/26/2017)  . [DISCONTINUED] DULoxetine (CYMBALTA) 30 MG capsule Take 30 mg by mouth at bedtime.   No facility-administered encounter medications on file as of 09/26/2017.      Objective: Blood pressure 100/70, pulse 72, temperature 98.9 F (37.2 C), temperature source Oral, resp. rate 18, height 5\' 6"  (1.676 m), weight 275 lb 3.2 oz (124.8 kg), last menstrual period 03/27/2014. Patient appears in some distress.  She is heaving and trying to vomit and emesis container. Conjunctiva is pink. Sclera is nonicteric Oropharyngeal mucosa is otherwise normal. No neck masses or thyromegaly noted. Cardiac exam with regular rhythm normal S1 and S2. No murmur or gallop noted. Lungs are clear to auscultation. Abdomen is obese.  Bowel sounds are hypoactive.  On palpation she has periumbilical tenderness as well as tenderness across her lower abdomen without guarding or rebound.  No organomegaly or masses. No LE edema or clubbing noted.  Labs/studies Results:  Abdominopelvic CT from 09/07/2017 reviewed.  Very subtle changes of sigmoid diverticulitis  Assessment:   This patient is 56 year old Caucasian female who presents with mid and lower abdominal pain associated nausea and vomiting.  She has history of similar episodes since January 2017 and workup has been negative.  This time she has not experienced diarrhea like she has before.  She was evaluated and treated for sigmoid diverticulitis 3 weeks ago(ER at [facility]) has not fully recovered.  Now that she has developed complication of diverticulitis.  I am afraid she needs to be reevaluated in the emergency room for symptom control and further  evaluation.  She also may be having intermittent partial small all obstruction to account for the symptoms.   Plan:  Patient referred to the emergency room at any point hospital. Patient's history reviewed with Dr. Julianne Rice.

## 2017-09-26 NOTE — ED Triage Notes (Signed)
Pt states she has had lower abd pain with N/ X2 weeks. Was seen by GI doctor and told to come to ED. Denies GU/, dark tarry or bloody stools. LBM yesterday was hard.

## 2017-09-26 NOTE — Patient Instructions (Signed)
Patient referred to the emergency room for further evaluation

## 2017-09-27 ENCOUNTER — Ambulatory Visit (HOSPITAL_COMMUNITY): Payer: Self-pay | Admitting: Licensed Clinical Social Worker

## 2017-09-27 ENCOUNTER — Other Ambulatory Visit: Payer: Self-pay

## 2017-09-27 DIAGNOSIS — R1032 Left lower quadrant pain: Secondary | ICD-10-CM

## 2017-09-27 DIAGNOSIS — R933 Abnormal findings on diagnostic imaging of other parts of digestive tract: Secondary | ICD-10-CM

## 2017-09-27 DIAGNOSIS — D72829 Elevated white blood cell count, unspecified: Secondary | ICD-10-CM

## 2017-09-27 DIAGNOSIS — E119 Type 2 diabetes mellitus without complications: Secondary | ICD-10-CM

## 2017-09-27 LAB — COMPREHENSIVE METABOLIC PANEL
ALK PHOS: 70 U/L (ref 38–126)
ALT: 21 U/L (ref 14–54)
ANION GAP: 9 (ref 5–15)
AST: 20 U/L (ref 15–41)
Albumin: 3.7 g/dL (ref 3.5–5.0)
BILIRUBIN TOTAL: 0.5 mg/dL (ref 0.3–1.2)
BUN: 19 mg/dL (ref 6–20)
CALCIUM: 8.8 mg/dL — AB (ref 8.9–10.3)
CO2: 26 mmol/L (ref 22–32)
Chloride: 99 mmol/L — ABNORMAL LOW (ref 101–111)
Creatinine, Ser: 0.65 mg/dL (ref 0.44–1.00)
GFR calc Af Amer: >60 mL/min (ref >60)
GLUCOSE: 130 mg/dL — AB (ref 65–99)
POTASSIUM: 3.7 mmol/L (ref 3.5–5.1)
Sodium: 134 mmol/L — ABNORMAL LOW (ref 135–145)
TOTAL PROTEIN: 7.1 g/dL (ref 6.5–8.1)

## 2017-09-27 LAB — CBC
HEMATOCRIT: 41 % (ref 36.0–46.0)
HEMOGLOBIN: 13.8 g/dL (ref 12.0–15.0)
MCH: 28.9 pg (ref 26.0–34.0)
MCHC: 33.7 g/dL (ref 30.0–36.0)
MCV: 86 fL (ref 78.0–100.0)
Platelets: 285 10*3/uL (ref 150–400)
RBC: 4.77 MIL/uL (ref 3.87–5.11)
RDW: 14.6 % (ref 11.5–15.5)
WBC: 15.5 10*3/uL — AB (ref 4.0–10.5)

## 2017-09-27 LAB — GLUCOSE, CAPILLARY
GLUCOSE-CAPILLARY: 142 mg/dL — AB (ref 65–99)
GLUCOSE-CAPILLARY: 90 mg/dL (ref 65–99)
GLUCOSE-CAPILLARY: 101 mg/dL — AB (ref 65–99)
Glucose-Capillary: 116 mg/dL — ABNORMAL HIGH (ref 65–99)
Glucose-Capillary: 119 mg/dL — ABNORMAL HIGH (ref 65–99)

## 2017-09-27 LAB — C-REACTIVE PROTEIN

## 2017-09-27 LAB — TSH: TSH: 5.142 u[IU]/mL — AB (ref 0.350–4.500)

## 2017-09-27 LAB — SEDIMENTATION RATE: Sed Rate: 25 mm/hr — ABNORMAL HIGH (ref 0–22)

## 2017-09-27 MED ORDER — POLYETHYLENE GLYCOL 3350 17 G PO PACK: 17.0000 g | PACK | Freq: Every day | ORAL | Status: DC

## 2017-09-27 MED ORDER — SODIUM CHLORIDE 0.9 % IV SOLN
3.0000 g | Freq: Four times a day (QID) | INTRAVENOUS | Status: DC
  Administered 2017-09-28 – 2017-09-29 (×6): 3 g via INTRAVENOUS
  Filled 2017-09-27 (×22): qty 3

## 2017-09-27 MED ORDER — POLYETHYLENE GLYCOL 3350 17 G PO PACK: 17.0000 g | PACK | Freq: Once | ORAL | Status: DC

## 2017-09-27 MED ORDER — SODIUM CHLORIDE 0.9 % IV BOLUS (SEPSIS)
500.0000 mL | Freq: Once | INTRAVENOUS | Status: AC
  Administered 2017-09-27: 500 mL via INTRAVENOUS

## 2017-09-27 MED ORDER — POLYETHYLENE GLYCOL 3350 17 G PO PACK
17.0000 g | PACK | ORAL | Status: AC
  Administered 2017-09-27 (×2): 17 g via ORAL
  Filled 2017-09-27 (×2): qty 1

## 2017-09-27 MED ORDER — INFLUENZA VAC SPLIT QUAD 0.5 ML IM SUSY: 0.5000 mL | PREFILLED_SYRINGE | INTRAMUSCULAR | Status: DC

## 2017-09-27 MED ORDER — PNEUMOCOCCAL VAC POLYVALENT 25 MCG/0.5ML IJ INJ: 0.5000 mL | INJECTION | INTRAMUSCULAR | Status: DC

## 2017-09-27 NOTE — Progress Notes (Signed)
Dr. Maudie Mercury notified of BP 85/46, rechecked for 87/42. HR 87. Patient denies dizziness. No distress noted at this time. NS IV bolus ordered.

## 2017-09-27 NOTE — Progress Notes (Signed)
Patient ID: Kristin Bailey, female   DOB: 02-11-1961, 56 y.o.   MRN: [id number] Nausea better since receiving Zofran.  She denies any abdominal pain. Sometimes has twinges. Points to umbilical area when she has pain. Last BM Monday. She denies any fever. She is 5% better. Dr. Laural Golden is aware.

## 2017-09-27 NOTE — Progress Notes (Signed)
PROGRESS NOTE    Kristin Bailey  CZY:[id number] DOB: 05/17/1961 DOA: 09/26/2017 PCP: Rory Percy, MD    Brief Narrative:  56 year old female with a history of hypertension, diabetes, irritable bowel syndrome, admitted to the hospital with abdominal pain of unclear etiology.  She had associated nausea and vomiting.  CT scan indicated possible diverticulitis, although this is felt to be less likely after reviewed by GI.  She is undergoing further workup.   Assessment & Plan:   Active Problems:   Leukocytosis   Controlled type 2 diabetes mellitus without complication, without long-term current use of insulin (HCC)   Diverticulitis   Hyponatremia   1. Abdominal pain.  Etiology is unclear.  She was noted to have thickening of sigmoid colon on recent CT imaging.  Seen by Dr. Laural Golden who did not feel that this was diverticulitis related.  She has diffuse abdominal pain, nausea and vomiting.  She has intermittently had these symptoms for quite some time and has undergone extensive workup.  May need to have sigmoidoscopy versus surgical consult.  Currently on intravenous antibiotics for intra-abdominal infection.  She has an elevated leukocytosis which may be related to intra-abdominal source.  Continue current antibiotics. 2. GERD.  Continue on PPI 3. Diabetes.  Blood sugar stable.  Trulicity and glipizide currently on hold. Continue on sliding scale insulin. 4. Hypertension.  Continue on lisinopril and hydrochlorothiazide. 5. Anxiety.  Continue on Xanax as needed and Cymbalta 6. Hypothyroidism.  Continue on Synthroid   DVT prophylaxis: SCDs Code Status: Full code Family Communication: No family present Disposition Plan: Discharge home once improved   Consultants:   Gastroenterology  Procedures:     Antimicrobials:   Zosyn 11/13 > 11/14  Unasyn 11/14>   Subjective: Continues to have abdominal pain.  Has not had a good bowel movement in several days.  Appears to be  tolerating clear liquids  Objective: Vitals:   09/26/17 2215 09/26/17 2250 09/27/17 0433 09/27/17 0657  BP:  (!) 93/39 (!) 87/42 (!) 95/42  Pulse: 88 98 87 84  Resp:  16 18   Temp:  98.8 F (37.1 C) 97.8 F (36.6 C)   TempSrc:  Oral Oral   SpO2: 97% 98% 96%   Weight:  124.4 kg (274 lb 3.2 oz) 124.2 kg (273 lb 14.4 oz)   Height:  5\' 6"  (1.676 m)      Intake/Output Summary (Last 24 hours) at 09/27/2017 1918 Last data filed at 09/27/2017 1838 Gross per 24 hour  Intake 1978.75 ml  Output 2250 ml  Net -271.25 ml   Filed Weights   09/26/17 2250 09/27/17 0433  Weight: 124.4 kg (274 lb 3.2 oz) 124.2 kg (273 lb 14.4 oz)    Examination:  General exam: Appears calm and comfortable  Respiratory system: Clear to auscultation. Respiratory effort normal. Cardiovascular system: S1 & S2 heard, RRR. No JVD, murmurs, rubs, gallops or clicks. No pedal edema. Gastrointestinal system: Abdomen is obese, soft and diffusely tender. No organomegaly or masses felt. Normal bowel sounds heard. Central nervous system: Alert and oriented. No focal neurological deficits. Extremities: Symmetric 5 x 5 power. Skin: No rashes, lesions or ulcers Psychiatry: Judgement and insight appear normal. Mood & affect appropriate.     Data Reviewed: I have personally reviewed following labs and imaging studies  CBC: Recent Labs  Lab 09/26/17 1726 09/27/17 0508  WBC 17.9* 15.5*  HGB 15.3* 13.8  HCT 44.8 41.0  MCV 85.7 86.0  PLT 361 093   Basic Metabolic Panel:  Recent Labs  Lab 09/26/17 1726 09/27/17 0508  NA 133* 134*  K 4.2 3.7  CL 96* 99*  CO2 26 26  GLUCOSE 106* 130*  BUN 18 19  CREATININE 0.61 0.65  CALCIUM 9.8 8.8*   GFR: Estimated Creatinine Clearance: 107 mL/min (by C-G formula based on SCr of 0.65 mg/dL). Liver Function Tests: Recent Labs  Lab 09/26/17 1726 09/27/17 0508  AST 21 20  ALT 26 21  ALKPHOS 83 70  BILITOT 0.7 0.5  PROT 8.4* 7.1  ALBUMIN 4.5 3.7   Recent Labs  Lab  09/26/17 1726  LIPASE 47   No results for input(s): AMMONIA in the last 168 hours. Coagulation Profile: No results for input(s): INR, PROTIME in the last 168 hours. Cardiac Enzymes: No results for input(s): CKTOTAL, CKMB, CKMBINDEX, TROPONINI in the last 168 hours. BNP (last 3 results) No results for input(s): PROBNP in the last 8760 hours. HbA1C: No results for input(s): HGBA1C in the last 72 hours. CBG: Recent Labs  Lab 09/27/17 0003 09/27/17 0424 09/27/17 0752 09/27/17 1135 09/27/17 1610  GLUCAP 151* 142* 116* 119* 90   Lipid Profile: No results for input(s): CHOL, HDL, LDLCALC, TRIG, CHOLHDL, LDLDIRECT in the last 72 hours. Thyroid Function Tests: Recent Labs    09/27/17 0508  TSH 5.142*   Anemia Panel: No results for input(s): VITAMINB12, FOLATE, FERRITIN, TIBC, IRON, RETICCTPCT in the last 72 hours. Sepsis Labs: No results for input(s): PROCALCITON, LATICACIDVEN in the last 168 hours.  No results found for this or any previous visit (from the past 240 hour(s)).       Radiology Studies: X-ray Chest Pa And Lateral  Result Date: 09/27/2017 CLINICAL DATA:  Acute onset of leukocytosis. EXAM: CHEST  2 VIEW COMPARISON:  Chest radiograph performed 06/02/2017 FINDINGS: The lungs are well-aerated. Minimal right basilar opacity may reflect atelectasis or possibly mild infection. There is no evidence of pleural effusion or pneumothorax. The heart is normal in size; the mediastinal contour is within normal limits. No acute osseous abnormalities are seen. Calcifications overlying the right humeral head likely reflect calcific tendinitis. IMPRESSION: 1. Minimal right basilar opacity may reflect atelectasis or possibly mild infection. 2. Calcifications overlying the right humeral head likely reflect calcific tendinitis. Electronically Signed   By: Garald Balding M.D.   On: 09/27/2017 00:03   Ct Abdomen Pelvis W Contrast  Result Date: 09/26/2017 CLINICAL DATA:  Nausea, and  vomiting, lower abdomen pain for 3 weeks. EXAM: CT ABDOMEN AND PELVIS WITH CONTRAST TECHNIQUE: Multidetector CT imaging of the abdomen and pelvis was performed using the standard protocol following bolus administration of intravenous contrast. CONTRAST:  156mL ISOVUE-300 IOPAMIDOL (ISOVUE-300) INJECTION 61% COMPARISON:  September 07, 2017 FINDINGS: Lower chest: No acute abnormality. Hepatobiliary: No focal liver abnormality is seen. Status post cholecystectomy. No biliary dilatation. There is diffuse low density of the liver. Pancreas: Unremarkable. No pancreatic ductal dilatation or surrounding inflammatory changes. Spleen: Normal in size without focal abnormality. Adrenals/Urinary Tract: Adrenal glands are unremarkable. Kidneys are normal, without renal calculi, focal lesion, or hydronephrosis. Bladder is unremarkable. Stomach/Bowel: The stomach is normal. There is no small bowel obstruction. There is diverticulosis of the colon. There is question diffuse thick wall sigmoid in a decompressed sigmoid. The appendix is normal. Vascular/Lymphatic: No significant vascular findings are present. No enlarged abdominal or pelvic lymph nodes. Reproductive: Status post hysterectomy. The bilateral adnexa are not changed compared prior exam. Other: None. Musculoskeletal: Degenerative joint changes of the spine are noted. IMPRESSION: No acute abnormality is  identified in the abdomen. Question diffuse bowel wall thickening of the sigmoid colon in a decompressed sigmoid. Recommend further evaluation with barium enema on outpatient basis to exclude bowel wall thickening. Fatty infiltration of liver. Prior cholecystectomy and hysterectomy. Electronically Signed   By: Abelardo Diesel M.D.   On: 09/26/2017 18:55        Scheduled Meds: . aspirin EC  81 mg Oral Daily  . DULoxetine  60 mg Oral BID  . lisinopril  20 mg Oral Daily   And  . hydrochlorothiazide  12.5 mg Oral Daily  . [START ON 09/28/2017] Influenza vac split  quadrivalent PF  0.5 mL Intramuscular Tomorrow-1000  . insulin aspart  0-9 Units Subcutaneous TID WC  . levothyroxine  100 mcg Oral QAC breakfast  . montelukast  10 mg Oral QHS  . multivitamin with minerals  1 tablet Oral Daily  . pantoprazole  40 mg Oral Daily  . [START ON 09/28/2017] pneumococcal 23 valent vaccine  0.5 mL Intramuscular Tomorrow-1000  . polyethylene glycol  17 g Oral Q4H  . saccharomyces boulardii  250 mg Oral BID   Continuous Infusions: . sodium chloride 75 mL/hr at 09/26/17 2353  . piperacillin-tazobactam (ZOSYN)  IV Stopped (09/27/17 1823)     LOS: 1 day    Time spent: 16mins    Arron Tetrault, MD [facility] Pager [phone]  If 7PM-7AM, please contact night-coverage [email or web address] Password [email or web address] 09/27/2017, 7:18 PM

## 2017-09-27 NOTE — Progress Notes (Signed)
  Subjective:  Patient feels better.  She is not nauseated anymore.  However she continues to complain of pain which is primarily in the periumbilical region as well as hypogastric region.  She has not had a BM today.  She had one day before last.  She states her symptoms are similar to the ones that she has had before.   Objective: Blood pressure (!) 95/42, pulse 84, temperature 97.8 F (36.6 C), temperature source Oral, resp. rate 18, height 5\' 6"  (1.676 m), weight 273 lb 14.4 oz (124.2 kg), last menstrual period 03/27/2014, SpO2 96 %. Patient is alert and appears to be comfortable. Abdomen is full.  Bowel sounds are hypoactive.  On palpation abdomen is soft.  She has mild tenderness in periumbilical region as well as across lower abdomen.  Pushing and supraumbilical region causes discomfort in lower abdomen.  No guarding or rebound.  No organomegaly or masses. No LE edema or clubbing noted.  Labs/studies Results:  Recent Labs    21-Oct-2017 1726 09/27/17 0508  WBC 17.9* 15.5*  HGB 15.3* 13.8  HCT 44.8 41.0  PLT 361 285    BMET  Recent Labs    Oct 21, 2017 1726 09/27/17 0508  NA 133* 134*  K 4.2 3.7  CL 96* 99*  CO2 26 26  GLUCOSE 106* 130*  BUN 18 19  CREATININE 0.61 0.65  CALCIUM 9.8 8.8*    LFT  Recent Labs    October 21, 2017 1726 09/27/17 0508  PROT 8.4* 7.1  ALBUMIN 4.5 3.7  AST 21 20  ALT 26 21  ALKPHOS 83 70  BILITOT 0.7 0.5     Multiple CT images reviewed with Dr. Lorriane Shire. No evidence of sigmoid diverticulitis on current or study of 3 weeks ago.  Sigmoid diverticulosis.  Wall thickening and change in possibly due to poor distention.  Assessment:  Patient symptom complex assisting of abdominal pain nausea vomiting and diarrhea manes unexplained.  I do not believe these symptoms are due to sigmoid diverticulitis.  Symptoms are also not typical of IBD.  She has undergone multiple studies including screening for porphyria and was negative.  Will look for  autoimmune and or allergic disorder. May consider flexible sigmoidoscopy prior to discharge.   Recommendations:  Clear liquids. CBC with differential in a.m. Sed rate, CRP C4 and IgE today. Continue Zosyn for now.

## 2017-09-28 ENCOUNTER — Encounter (HOSPITAL_COMMUNITY)
Admission: EM | Disposition: A | Discharge status: Home / Self Care | Payer: Self-pay | Source: Home / Self Care | Attending: Internal Medicine

## 2017-09-28 DIAGNOSIS — K5792 Diverticulitis of intestine, part unspecified, without perforation or abscess without bleeding: Secondary | ICD-10-CM

## 2017-09-28 DIAGNOSIS — K648 Other hemorrhoids: Secondary | ICD-10-CM

## 2017-09-28 DIAGNOSIS — R1032 Left lower quadrant pain: Secondary | ICD-10-CM

## 2017-09-28 DIAGNOSIS — E871 Hypo-osmolality and hyponatremia: Secondary | ICD-10-CM

## 2017-09-28 DIAGNOSIS — K529 Noninfective gastroenteritis and colitis, unspecified: Secondary | ICD-10-CM

## 2017-09-28 DIAGNOSIS — K5730 Diverticulosis of large intestine without perforation or abscess without bleeding: Secondary | ICD-10-CM

## 2017-09-28 DIAGNOSIS — K6389 Other specified diseases of intestine: Secondary | ICD-10-CM

## 2017-09-28 HISTORY — PX: FLEXIBLE SIGMOIDOSCOPY: SHX5431

## 2017-09-28 LAB — CBC WITH DIFFERENTIAL/PLATELET
BASOS ABS: 0 10*3/uL (ref 0.0–0.1)
Basophils Relative: 0 %
Eosinophils Absolute: 0.2 10*3/uL (ref 0.0–0.7)
Eosinophils Relative: 1 %
HEMATOCRIT: 39.1 % (ref 36.0–46.0)
HEMOGLOBIN: 12.7 g/dL (ref 12.0–15.0)
LYMPHS ABS: 3.5 10*3/uL (ref 0.7–4.0)
LYMPHS PCT: 30 %
MCH: 28.3 pg (ref 26.0–34.0)
MCHC: 32.5 g/dL (ref 30.0–36.0)
MCV: 87.3 fL (ref 78.0–100.0)
Monocytes Absolute: 0.8 10*3/uL (ref 0.1–1.0)
Monocytes Relative: 7 %
NEUTROS ABS: 7.2 10*3/uL (ref 1.7–7.7)
NEUTROS PCT: 62 %
Platelets: 266 10*3/uL (ref 150–400)
RBC: 4.48 MIL/uL (ref 3.87–5.11)
RDW: 14.3 % (ref 11.5–15.5)
WBC: 11.7 10*3/uL — AB (ref 4.0–10.5)

## 2017-09-28 LAB — GLUCOSE, CAPILLARY
GLUCOSE-CAPILLARY: 143 mg/dL — AB (ref 65–99)
Glucose-Capillary: 116 mg/dL — ABNORMAL HIGH (ref 65–99)
Glucose-Capillary: 115 mg/dL — ABNORMAL HIGH (ref 65–99)

## 2017-09-28 LAB — HIV ANTIBODY (ROUTINE TESTING W REFLEX): HIV Screen 4th Generation wRfx: NONREACTIVE

## 2017-09-28 LAB — IGE: IGE (IMMUNOGLOBULIN E), SERUM: 10 [IU]/mL (ref 0–100)

## 2017-09-28 LAB — C4 COMPLEMENT: COMPLEMENT C4, BODY FLUID: 27 mg/dL (ref 14–44)

## 2017-09-28 SURGERY — SIGMOIDOSCOPY, FLEXIBLE
Anesthesia: Moderate Sedation

## 2017-09-28 MED ORDER — SODIUM CHLORIDE 0.9 % IV SOLN
INTRAVENOUS | Status: DC
  Administered 2017-09-28: 16:00:00 via INTRAVENOUS

## 2017-09-28 MED ORDER — MIDAZOLAM HCL 5 MG/5ML IJ SOLN
INTRAMUSCULAR | Status: DC | PRN
  Administered 2017-09-28 (×3): 2 mg via INTRAVENOUS
  Administered 2017-09-28: 1 mg via INTRAVENOUS

## 2017-09-28 MED ORDER — MEPERIDINE HCL 50 MG/ML IJ SOLN
INTRAMUSCULAR | Status: AC
  Filled 2017-09-28: qty 1

## 2017-09-28 MED ORDER — MIDAZOLAM HCL 5 MG/5ML IJ SOLN
INTRAMUSCULAR | Status: AC
  Filled 2017-09-28: qty 10

## 2017-09-28 MED ORDER — FLEET ENEMA 7-19 GM/118ML RE ENEM
2.0000 | ENEMA | Freq: Once | RECTAL | Status: AC
  Administered 2017-09-28: 2 via RECTAL

## 2017-09-28 MED ORDER — MEPERIDINE HCL 25 MG/ML IJ SOLN
INTRAMUSCULAR | Status: DC | PRN
  Administered 2017-09-28 (×2): 25 mg via INTRAVENOUS

## 2017-09-28 NOTE — Progress Notes (Signed)
Dr. Constance Haw note appreciated. Patient's condition discussed with her and Dr. Roderic Palau. Patient feels better still with abdominal pain which is predominantly in mid region.  She is started he will after she took her pills but she is better.  She is passing flatus.  She still has not had a BM.   He is afebrile. Abdomen is full with hypoactive bowel sounds.  She remains with mid abdominal tenderness as well as vague tenderness in both iliac fossae. We will see down to 11.7. Left is normal.  Eosinophils 1%. CRP and C4 are normal. Sed rate borderline at 25.   I would recommend sigmoidoscopy to directly examine sigmoid colon followed by barium enema unless she has focal colitis. Patient is agreeable.

## 2017-09-28 NOTE — Consult Note (Signed)
[facility] Consult  Reason for Consult: Chronic abdominal pain  Referring Physician: Dr. Laural Golden   Chief Complaint    Abdominal Pain      Kristin Bailey is a 56 y.o. female.  HPI: Kristin Bailey is a 56 yo with DM, GERD, IBS following her cholecystectomy and chronic abdominal pain that has undergone an extensive workup with Dr. Laural Golden over Kristin past 1-1.5.  She has had multiple hospitalizations, and reports that she has had abdominal pain which is mostly periumbilical for several years.  She reports nausea frequently, and vomiting about twice a month.  She says that she can have diarrhea and then can have constipation, and that her stools are narrow/ pencil thin in caliber at times. She underwent a colonoscopy 2015 that was unremarkable for IBD and showed diverticulosis, and underwent EGD/ capsule studies, and multiple CT scans in Kristin past few years.  She has even had extensive testing including testing for porphyria which was negative. Looking back over her chart and her prior CTs she has had episodes of colitis in Kristin past involving Kristin sigmoid and descending colon (10/2016 and currently), and has been treated for possible diverticulitis in Kristin past per her report.  She says she is unable to eat at times, but there is no documented weight loss recently.   Dr. Laural Golden had asked me to look at Kristin Bailey, and to see if she might be a candidate for a diagnostic laparoscopy given Kristin chronicity of Kristin symptoms and Kristin unknown etiology.   She did have a mother that had colon cancer in her 6s.   Past Medical History:  Diagnosis Date  . Depression   . Diabetes mellitus without complication ([place])   . Diverticulitis   . GERD (gastroesophageal reflux disease)   . Hypertension   . IBS (irritable bowel syndrome)   . Pneumonia   . Thyroid disease     Past Surgical History:  Procedure Laterality Date  . AGILE CAPSULE N/A 11/23/2016   Procedure: AGILE CAPSULE;  Surgeon: Rogene Houston, MD;  [facility];  Service: Endoscopy;  Laterality: N/A;  8:30  . CARPAL TUNNEL RELEASE    . CESAREAN SECTION    . CHOLECYSTECTOMY    . COLONOSCOPY N/A 08/28/2014   Procedure: COLONOSCOPY;  Surgeon: Rogene Houston, MD;  [facility]: [facility];  Service: Endoscopy;  Laterality: N/A;  1030  . CYST REMOVAL HAND    . ESOPHAGOGASTRODUODENOSCOPY N/A 03/19/2016   Procedure: ESOPHAGOGASTRODUODENOSCOPY (EGD);  Surgeon: Rogene Houston, MD;  [facility]: [facility];  Service: Endoscopy;  Laterality: N/A;  . FRACTURE SURGERY     rt ankle   . GIVENS CAPSULE STUDY N/A 12/15/2016   Procedure: GIVENS CAPSULE STUDY;  Surgeon: Rogene Houston, MD;  [facility]: [facility];  Service: Endoscopy;  Laterality: N/A;  . KNEE SURGERY     meniscus repair  . TUBAL LIGATION      Family History  Problem Relation Age of Onset  . Cancer Mother        colon cancer age74  . Hypertension Mother   . Stroke Mother   . Diabetes Mother   . Non-Hodgkin's lymphoma Father   . Hypertension Father   . Diabetes Father   . Stroke Maternal Grandmother   . Kidney disease Paternal Aunt   . Cancer Paternal Aunt   . Kidney disease Paternal Uncle   . Cancer Paternal Uncle     Social History   Tobacco  Use  . Smoking status: Never Smoker  . Smokeless tobacco: Never Used  Substance Use Topics  . Alcohol use: No  . Drug use: No    Medications:  I have reviewed Kristin Bailey's current medications. Prior to Admission:  Medications Prior to Admission  Medication Sig Dispense Refill Last Dose  . acetaminophen (TYLENOL 8 HOUR ARTHRITIS PAIN) 650 MG CR tablet Take 1,300 mg by mouth every morning. *May take additional as needed   09/25/2017 at Unknown time  . albuterol (PROVENTIL HFA;VENTOLIN HFA) 108 (90 BASE) MCG/ACT inhaler Inhale 2 puffs into Kristin lungs every 4 (four) hours as needed for wheezing or shortness of breath.   unknown  . ALPRAZolam (XANAX) 0.25 MG tablet Take 0.25 mg by mouth 2 (two) times  daily as needed for anxiety.   09/26/2017 at Unknown time  . Ascorbic Acid (VITAMIN C) 1000 MG tablet Take 1,000 mg 2 (two) times daily by mouth.   09/26/2017 at Unknown time  . aspirin EC 81 MG tablet Take 81 mg by mouth daily.   09/26/2017 at Unknown time  . Dulaglutide (TRULICITY) 5.03 UU/8.2CM SOPN Inject 0.5 mLs into Kristin skin every Monday. On  Monday's.    09/25/2017 at Unknown time  . DULoxetine (CYMBALTA) 60 MG capsule Take 1 capsule (60 mg total) by mouth every morning. (Bailey taking differently: Take 60 mg 2 (two) times daily by mouth. ) 60 capsule 0 09/26/2017 at Unknown time  . glipiZIDE (GLUCOTROL) 5 MG tablet Take 5 mg by mouth 2 (two) times daily.    09/26/2017 at Unknown time  . HYDROcodone-acetaminophen (NORCO/VICODIN) 5-325 MG tablet Take 1 tablet by mouth every 4 (four) hours as needed. 10 tablet 0 Past Month at Unknown time  . levothyroxine (SYNTHROID, LEVOTHROID) 100 MCG tablet Take 100 mcg daily by mouth.   1 09/26/2017 at Unknown time  . lisinopril-hydrochlorothiazide (PRINZIDE,ZESTORETIC) 20-12.5 MG tablet TAKE ONE TABLET BY MOUTH DAILY   09/26/2017 at Unknown time  . montelukast (SINGULAIR) 10 MG tablet Take 10 mg by mouth at bedtime.   09/25/2017 at Unknown time  . Multiple Vitamin (MULTIVITAMIN WITH MINERALS) TABS tablet Take 1 tablet by mouth daily.   09/26/2017 at Unknown time  . omeprazole (PRILOSEC OTC) 20 MG tablet Take 1 tablet (20 mg total) by mouth daily. (Bailey taking differently: Take 20 mg by mouth every other day. BEDTIME) 20 tablet 0 09/25/2017 at Unknown time  . ondansetron (ZOFRAN) 4 MG tablet Take 1 tablet (4 mg total) by mouth every 8 (eight) hours as needed for nausea or vomiting. 30 tablet 1 09/26/2017 at Unknown time  . OVER Kristin COUNTER MEDICATION Bailey is taking CBD Oil - 1-2 drops under tongue, Bailey will take when her tummy hurts. PRN   Past Week at Unknown time  . traZODone (DESYREL) 100 MG tablet 100-150 mg at night as needed for sleep (Bailey  taking differently: Take 100-150 mg by mouth at bedtime as needed for sleep. 100-150 mg at night as needed for sleep) 45 tablet 0 09/25/2017 at Unknown time  . ciprofloxacin (CIPRO) 500 MG tablet Take 1 tablet (500 mg total) by mouth 2 (two) times daily. (Bailey not taking: Reported on 09/26/2017) 20 tablet 0 Not Taking  . metroNIDAZOLE (FLAGYL) 500 MG tablet Take 1 tablet (500 mg total) by mouth 2 (two) times daily. (Bailey not taking: Reported on 09/26/2017) 14 tablet 0 Not Taking  . valACYclovir (VALTREX) 1000 MG tablet Take 2 g 2 (two) times daily by mouth.  2  Completed Course at Unknown time   Scheduled: . aspirin EC  81 mg Oral Daily  . DULoxetine  60 mg Oral BID  . lisinopril  20 mg Oral Daily   And  . hydrochlorothiazide  12.5 mg Oral Daily  . Influenza vac split quadrivalent PF  0.5 mL Intramuscular Tomorrow-1000  . insulin aspart  0-9 Units Subcutaneous TID WC  . levothyroxine  100 mcg Oral QAC breakfast  . montelukast  10 mg Oral QHS  . multivitamin with minerals  1 tablet Oral Daily  . pantoprazole  40 mg Oral Daily  . pneumococcal 23 valent vaccine  0.5 mL Intramuscular Tomorrow-1000  . saccharomyces boulardii  250 mg Oral BID   Continuous: . ampicillin-sulbactam (UNASYN) IV Stopped (09/28/17 2353)   Allergies: No Known Allergies   ROS:  A comprehensive review of systems was negative except for: Gastrointestinal: positive for abdominal pain, constipation, diarrhea, nausea, reflux symptoms and vomiting  Blood pressure (!) 119/56, pulse 82, temperature 97.7 F (36.5 C), temperature source Oral, resp. rate (!) 21, height 5' 6"  (1.676 m), weight 273 lb 5.9 oz (124 kg), last menstrual period 03/27/2014, SpO2 97 %. Physical Exam  Constitutional: She is oriented to person, place, and time and well-developed, well-nourished, and in no distress.  HENT:  Head: Normocephalic.  Eyes: Pupils are equal, round, and reactive to light.  Cardiovascular: Normal rate and regular  rhythm.  Pulmonary/Chest: Effort normal and breath sounds normal. No respiratory distress.  Abdominal: Soft. She exhibits no distension. There is tenderness. There is no rebound and no guarding.  Pfannenstiel scar, port sites scar from cholecystectomy  Musculoskeletal: Normal range of motion.  Neurological: She is alert and oriented to person, place, and time.  Skin: Skin is warm and dry.  Psychiatric: Mood, memory, affect and judgment normal.  Vitals reviewed.   Results: Results for orders placed or performed during Kristin hospital encounter of 09/26/17 (from Kristin past 48 hour(s))  Urinalysis, Routine w reflex microscopic     Status: None   Collection Time: 09/26/17  5:16 PM  Result Value Ref Range   Color, Urine YELLOW YELLOW   APPearance CLEAR CLEAR   Specific Gravity, Urine 1.020 1.005 - 1.030   pH 5.0 5.0 - 8.0   Glucose, UA NEGATIVE NEGATIVE mg/dL   Hgb urine dipstick NEGATIVE NEGATIVE   Bilirubin Urine NEGATIVE NEGATIVE   Ketones, ur NEGATIVE NEGATIVE mg/dL   Protein, ur NEGATIVE NEGATIVE mg/dL   Nitrite NEGATIVE NEGATIVE   Leukocytes, UA NEGATIVE NEGATIVE  Lipase, blood     Status: None   Collection Time: 09/26/17  5:26 PM  Result Value Ref Range   Lipase 47 11 - 51 U/L  Comprehensive metabolic panel     Status: Abnormal   Collection Time: 09/26/17  5:26 PM  Result Value Ref Range   Sodium 133 (L) 135 - 145 mmol/L   Potassium 4.2 3.5 - 5.1 mmol/L   Chloride 96 (L) 101 - 111 mmol/L   CO2 26 22 - 32 mmol/L   Glucose, Bld 106 (H) 65 - 99 mg/dL   BUN 18 6 - 20 mg/dL   Creatinine, Ser 0.61 0.44 - 1.00 mg/dL   Calcium 9.8 8.9 - 10.3 mg/dL   Total Protein 8.4 (H) 6.5 - 8.1 g/dL   Albumin 4.5 3.5 - 5.0 g/dL   AST 21 15 - 41 U/L   ALT 26 14 - 54 U/L   Alkaline Phosphatase 83 38 - 126 U/L   Total Bilirubin 0.7  0.3 - 1.2 mg/dL   GFR calc non Af Amer >60 >60 mL/min   GFR calc Af Amer >60 >60 mL/min    Comment: (NOTE) Kristin eGFR has been calculated using Kristin CKD EPI  equation. This calculation has not been validated in all clinical situations. eGFR's persistently <60 mL/min signify possible Chronic Kidney Disease.    Anion gap 11 5 - 15  CBC     Status: Abnormal   Collection Time: 09/26/17  5:26 PM  Result Value Ref Range   WBC 17.9 (H) 4.0 - 10.5 K/uL   RBC 5.23 (H) 3.87 - 5.11 MIL/uL   Hemoglobin 15.3 (H) 12.0 - 15.0 g/dL   HCT 44.8 36.0 - 46.0 %   MCV 85.7 78.0 - 100.0 fL   MCH 29.3 26.0 - 34.0 pg   MCHC 34.2 30.0 - 36.0 g/dL   RDW 14.4 11.5 - 15.5 %   Platelets 361 150 - 400 K/uL  Glucose, capillary     Status: Abnormal   Collection Time: 09/27/17 12:03 AM  Result Value Ref Range   Glucose-Capillary 151 (H) 65 - 99 mg/dL  Glucose, capillary     Status: Abnormal   Collection Time: 09/27/17  4:24 AM  Result Value Ref Range   Glucose-Capillary 142 (H) 65 - 99 mg/dL  HIV antibody (Routine Testing)     Status: None   Collection Time: 09/27/17  5:08 AM  Result Value Ref Range   HIV Screen 4th Generation wRfx Non Reactive Non Reactive    Comment: (NOTE) Performed At: [facility] [place], [place] [id number] Rush Farmer MD HW:[id number]   Comprehensive metabolic panel     Status: Abnormal   Collection Time: 09/27/17  5:08 AM  Result Value Ref Range   Sodium 134 (L) 135 - 145 mmol/L   Potassium 3.7 3.5 - 5.1 mmol/L   Chloride 99 (L) 101 - 111 mmol/L   CO2 26 22 - 32 mmol/L   Glucose, Bld 130 (H) 65 - 99 mg/dL   BUN 19 6 - 20 mg/dL   Creatinine, Ser 0.65 0.44 - 1.00 mg/dL   Calcium 8.8 (L) 8.9 - 10.3 mg/dL   Total Protein 7.1 6.5 - 8.1 g/dL   Albumin 3.7 3.5 - 5.0 g/dL   AST 20 15 - 41 U/L   ALT 21 14 - 54 U/L   Alkaline Phosphatase 70 38 - 126 U/L   Total Bilirubin 0.5 0.3 - 1.2 mg/dL   GFR calc non Af Amer >60 >60 mL/min   GFR calc Af Amer >60 >60 mL/min    Comment: (NOTE) Kristin eGFR has been calculated using Kristin CKD EPI equation. This calculation has not been validated in all clinical  situations. eGFR's persistently <60 mL/min signify possible Chronic Kidney Disease.    Anion gap 9 5 - 15  CBC     Status: Abnormal   Collection Time: 09/27/17  5:08 AM  Result Value Ref Range   WBC 15.5 (H) 4.0 - 10.5 K/uL   RBC 4.77 3.87 - 5.11 MIL/uL   Hemoglobin 13.8 12.0 - 15.0 g/dL   HCT 41.0 36.0 - 46.0 %   MCV 86.0 78.0 - 100.0 fL   MCH 28.9 26.0 - 34.0 pg   MCHC 33.7 30.0 - 36.0 g/dL   RDW 14.6 11.5 - 15.5 %   Platelets 285 150 - 400 K/uL  TSH     Status: Abnormal   Collection Time: 09/27/17  5:08 AM  Result Value  Ref Range   TSH 5.142 (H) 0.350 - 4.500 uIU/mL    Comment: Performed by a 3rd Generation assay with a functional sensitivity of <=0.01 uIU/mL.  Glucose, capillary     Status: Abnormal   Collection Time: 09/27/17  7:52 AM  Result Value Ref Range   Glucose-Capillary 116 (H) 65 - 99 mg/dL  Glucose, capillary     Status: Abnormal   Collection Time: 09/27/17 11:35 AM  Result Value Ref Range   Glucose-Capillary 119 (H) 65 - 99 mg/dL  Sedimentation rate     Status: Abnormal   Collection Time: 09/27/17  1:03 PM  Result Value Ref Range   Sed Rate 25 (H) 0 - 22 mm/hr  C-reactive protein     Status: None   Collection Time: 09/27/17  1:03 PM  Result Value Ref Range   CRP <0.8 <1.0 mg/dL    Comment: Performed at [facility], [place] [address], [place], [place] [address]  C4 complement     Status: None   Collection Time: 09/27/17  1:03 PM  Result Value Ref Range   Complement C4, Body Fluid 27 14 - 44 mg/dL    Comment: (NOTE) Performed At: [facility] [place], [place] [id number] Rush Farmer MD IN:[id number]   Glucose, capillary     Status: None   Collection Time: 09/27/17  4:10 PM  Result Value Ref Range   Glucose-Capillary 90 65 - 99 mg/dL  Glucose, capillary     Status: Abnormal   Collection Time: 09/27/17  9:06 PM  Result Value Ref Range   Glucose-Capillary 101 (H) 65 - 99 mg/dL   Comment 1 Notify RN    Comment 2 Document  in Chart   CBC with Differential/Platelet     Status: Abnormal   Collection Time: 09/28/17  5:30 AM  Result Value Ref Range   WBC 11.7 (H) 4.0 - 10.5 K/uL   RBC 4.48 3.87 - 5.11 MIL/uL   Hemoglobin 12.7 12.0 - 15.0 g/dL   HCT 39.1 36.0 - 46.0 %   MCV 87.3 78.0 - 100.0 fL   MCH 28.3 26.0 - 34.0 pg   MCHC 32.5 30.0 - 36.0 g/dL   RDW 14.3 11.5 - 15.5 %   Platelets 266 150 - 400 K/uL   Neutrophils Relative % 62 %   Neutro Abs 7.2 1.7 - 7.7 K/uL   Lymphocytes Relative 30 %   Lymphs Abs 3.5 0.7 - 4.0 K/uL   Monocytes Relative 7 %   Monocytes Absolute 0.8 0.1 - 1.0 K/uL   Eosinophils Relative 1 %   Eosinophils Absolute 0.2 0.0 - 0.7 K/uL   Basophils Relative 0 %   Basophils Absolute 0.0 0.0 - 0.1 K/uL  Glucose, capillary     Status: Abnormal   Collection Time: 09/28/17  7:44 AM  Result Value Ref Range   Glucose-Capillary 116 (H) 65 - 99 mg/dL    Personally reviewed CT scan and prior CT scans- Redundant transverse colon with extensive stool burden, thickened sigmoid colon with diverticulosis, no internal hernias or areas concerning for volvulus and all mesentery appears to have a normal appearance and location   X-ray Chest Pa And Lateral  Result Date: 09/27/2017 CLINICAL DATA:  Acute onset of leukocytosis. EXAM: CHEST  2 VIEW COMPARISON:  Chest radiograph performed 06/02/2017 FINDINGS: Kristin lungs are well-aerated. Minimal right basilar opacity may reflect atelectasis or possibly mild infection. There is no evidence of pleural effusion or pneumothorax. Kristin heart is normal  in size; Kristin mediastinal contour is within normal limits. No acute osseous abnormalities are seen. Calcifications overlying Kristin right humeral head likely reflect calcific tendinitis. IMPRESSION: 1. Minimal right basilar opacity may reflect atelectasis or possibly mild infection. 2. Calcifications overlying Kristin right humeral head likely reflect calcific tendinitis. Electronically Signed   By: Garald Balding M.D.   On:  09/27/2017 00:03   Ct Abdomen Pelvis W Contrast  Result Date: 09/26/2017 CLINICAL DATA:  Nausea, and vomiting, lower abdomen pain for 3 weeks. EXAM: CT ABDOMEN AND PELVIS WITH CONTRAST TECHNIQUE: Multidetector CT imaging of Kristin abdomen and pelvis was performed using Kristin standard protocol following bolus administration of intravenous contrast. CONTRAST:  156m ISOVUE-300 IOPAMIDOL (ISOVUE-300) INJECTION 61% COMPARISON:  September 07, 2017 FINDINGS: Lower chest: No acute abnormality. Hepatobiliary: No focal liver abnormality is seen. Status post cholecystectomy. No biliary dilatation. There is diffuse low density of Kristin liver. Pancreas: Unremarkable. No pancreatic ductal dilatation or surrounding inflammatory changes. Spleen: Normal in size without focal abnormality. Adrenals/Urinary Tract: Adrenal glands are unremarkable. Kidneys are normal, without renal calculi, focal lesion, or hydronephrosis. Bladder is unremarkable. Stomach/Bowel: Kristin stomach is normal. There is no small bowel obstruction. There is diverticulosis of Kristin colon. There is question diffuse thick wall sigmoid in a decompressed sigmoid. Kristin appendix is normal. Vascular/Lymphatic: No significant vascular findings are present. No enlarged abdominal or pelvic lymph nodes. Reproductive: Status post hysterectomy. Kristin bilateral adnexa are not changed compared prior exam. Other: None. Musculoskeletal: Degenerative joint changes of Kristin spine are noted. IMPRESSION: No acute abnormality is identified in Kristin abdomen. Question diffuse bowel wall thickening of Kristin sigmoid colon in a decompressed sigmoid. Recommend further evaluation with barium enema on outpatient basis to exclude bowel wall thickening. Fatty infiltration of liver. Prior cholecystectomy and hysterectomy. Electronically Signed   By: WAbelardo DieselM.D.   On: 09/26/2017 18:55     Assessment & Plan:  MKAMERA DUBASis a 56y.o. female with chronic abdominal pain of unknown etiology with a  history of IBS with diarrhea/ constipation, but her symptoms have been progressing and continue to cause her distress. She has had an extensive workup with Dr. RLaural Golden  Kristin CT scan from this admission to me definitely appears to have a thickened sigmoid colon and diverticulosis but I agree no diverticulitis.  She does also report narrowed stools at times.  I agree with radiology and think a barium enema would give uKoreamore information, and I potentially think Kristin Bailey may have segmental colitis associated with diverticulosis (SCAD) given Kristin symptoms and CT studies in Kristin past showing similar appearance.  -Discussed with Dr. RLaural Goldenand Dr. MRoderic Palau Dr. RLaural Goldenis planning to do a sigmoidoscopy today and I would also get a barium enema if Kristin sigmoidoscopy is negative  -Bailey is leery of a diagnostic laparoscopy and I agree with her as I would like to exhaust all other options and testing prior to doing this invasive procedure  -Will follow   All questions were answered to Kristin satisfaction of Kristin Bailey.   LVirl Cagey11/15/2018, 12:03 PM

## 2017-09-28 NOTE — Op Note (Signed)
[facility] Patient Name: Kristin Bailey Procedure Date: 09/28/2017 1:34 PM MRN: [id number] Date of Birth: 1961/08/02 Attending MD: Hildred Laser , MD CSN: [id number] Age: 56 Admit Type: Inpatient Procedure:                Flexible Sigmoidoscopy Indications:              Abnormal CT of the GI tract Providers:                Hildred Laser, MD, Hinton Rao, RN, Zoila Shutter,                            Technologist Referring MD:              Medicines:                Meperidine 50 mg IV, Midazolam 7 mg IV Complications:            No immediate complications. Estimated Blood Loss:     Estimated blood loss was minimal. Procedure:                Pre-Anesthesia Assessment:                           - Prior to the procedure, a History and Physical                            was performed, and patient medications and                            allergies were reviewed. The patient's tolerance of                            previous anesthesia was also reviewed. The risks                            and benefits of the procedure and the sedation                            options and risks were discussed with the patient.                            All questions were answered, and informed consent                            was obtained. Prior Anticoagulants: The patient                            last took aspirin 1 day prior to the procedure. ASA                            Grade Assessment: II - A patient with mild systemic                            disease. After reviewing the risks and benefits,  the patient was deemed in satisfactory condition to                            undergo the procedure.                           After obtaining informed consent, the scope was                            passed under direct vision. The [id number])                            scope was introduced through the anus and advanced                            to the the  sigmoid colon. The flexible                            sigmoidoscopy was accomplished without difficulty.                            The patient tolerated the procedure well. The                            quality of the bowel preparation was adequate to                            identify polyps. Scope In: 2:08:09 PM Scope Out: 2:19:12 PM Total Procedure Duration: 0 hours 11 minutes 3 seconds  Findings:      The perianal and digital rectal examinations were normal.      A segmental area of mildly erythematous mucosa was found in the mid       sigmoid colon. Short segment about 4 cm long This was biopsied with a       cold forceps for histology.      Scattered medium-mouthed diverticula were found in the sigmoid colon.      Internal hemorrhoids were found during retroflexion. The hemorrhoids       were small. Impression:               - Erythematous granular mucosa in the mid sigmoid                            colon. Biopsied.                           - Diverticulosis in the sigmoid colon.                           - Internal hemorrhoids.                           Comment: Mild segmental colitis at sigmoid colon.                            This finding would not explain patient's symptom  complex.                           No colonic stricture noted.                           Barium enema to be performedas outpatient. Moderate Sedation:      Moderate (conscious) sedation was administered by the endoscopy nurse       and supervised by the endoscopist. The following parameters were       monitored: oxygen saturation, heart rate, blood pressure, CO2       capnography and response to care. Total physician intraservice time was       16 minutes. Recommendation:           - Return patient to hospital ward for ongoing care.                           - Diabetic (ADA) diet today.                           - Await pathology results. Procedure Code(s):        ---  Professional ---                           [phone], Sigmoidoscopy, flexible; with biopsy, single                            or multiple                           99152, Moderate sedation services provided by the                            same physician or other qualified health care                            professional performing the diagnostic or                            therapeutic service that the sedation supports,                            requiring the presence of an independent trained                            observer to assist in the monitoring of the                            patient's level of consciousness and physiological                            status; initial 15 minutes of intraservice time,                            patient age 69 years or older Diagnosis Code(s):        --- Professional ---  K63.89, Other specified diseases of intestine                           K64.8, Other hemorrhoids                           K57.30, Diverticulosis of large intestine without                            perforation or abscess without bleeding                           R93.3, Abnormal findings on diagnostic imaging of                            other parts of digestive tract CPT copyright 2016 [employer]. All rights reserved. The codes documented in this report are preliminary and upon coder review may  be revised to meet current compliance requirements. Hildred Laser, MD Hildred Laser, MD 09/28/2017 3:10:38 PM This report has been signed electronically. Number of Addenda: 0

## 2017-09-28 NOTE — Progress Notes (Signed)
PROGRESS NOTE    Kristin Bailey  TIR:[id number] DOB: 1961/05/24 DOA: 09/26/2017 PCP: Rory Percy, MD    Brief Narrative:  56 year old female with a history of hypertension, diabetes, irritable bowel syndrome, admitted to the hospital with abdominal pain of unclear etiology.  She had associated nausea and vomiting.  CT scan indicated possible diverticulitis, although this is felt to be less likely after reviewed by GI.  She is undergoing further workup.   Assessment & Plan:   Active Problems:   Leukocytosis   Controlled type 2 diabetes mellitus without complication, without long-term current use of insulin (HCC)   Diverticulitis   Hyponatremia   Abdominal pain, left lower quadrant   1. Abdominal pain.  Etiology is unclear.  She was noted to have thickening of sigmoid colon on recent CT imaging.  Seen by Dr. Laural Golden who did not feel that this was diverticulitis related.  She has diffuse abdominal pain, nausea and vomiting.  She has intermittently had these symptoms for quite some time and has undergone extensive workup.  Patient underwent flexible sigmoidoscopy today that showed mild colitis, unlikely to explain patient's symptoms.  She was also seen by general surgery who wish to pursue barium enema study prior to considering exploratory laparotomy.  Diet is being advanced as tolerated after procedure.  Currently on intravenous antibiotics for intra-abdominal infection.  She has leukocytosis which may be related to intra-abdominal source.  Continue current antibiotics.  Biopsies were done during sigmoidoscopy with results hopefully available by tomorrow. 2. GERD.  Continue on PPI 3. Diabetes.  Blood sugar stable.  Trulicity and glipizide currently on hold. Continue on sliding scale insulin. 4. Hypertension.  Continue on lisinopril and hydrochlorothiazide. 5. Anxiety.  Continue on Xanax as needed and Cymbalta 6. Hypothyroidism.  Continue on Synthroid   DVT prophylaxis: SCDs Code  Status: Full code Family Communication: No family present Disposition Plan: Discharge home once improved   Consultants:   Gastroenterology  General surgery  Procedures:  Flexible sigmoidoscopy:- Erythematous granular mucosa in the mid sigmoid                            colon. Biopsied.                           - Diverticulosis in the sigmoid colon.                           - Internal hemorrhoids.                           Comment: Mild segmental colitis at sigmoid colon.                            This finding would not explain patient's symptom                            complex.                           No colonic stricture noted.                           Barium enema to be performedas outpatient.     Antimicrobials:  Zosyn 11/13 > 11/14  Unasyn 11/14>   Subjective: Patient underwent sigmoidoscopy today.  Diet has been advanced to solid food.  She has had some nausea after eating, but no vomiting.  Objective: Vitals:   09/28/17 1415 09/28/17 1420 09/28/17 1425 09/28/17 1443  BP: 126/65 113/67 110/71 114/63  Pulse: 84 85 86 88  Resp: 14 (!) 23 (!) 21 19  Temp:    98 F (36.7 C)  TempSrc:    Oral  SpO2: 97% 99% 99% 100%  Weight:      Height:        Intake/Output Summary (Last 24 hours) at 09/28/2017 1802 Last data filed at 09/28/2017 1453 Gross per 24 hour  Intake 1650 ml  Output 4000 ml  Net -2350 ml   Filed Weights   09/26/17 2250 09/27/17 0433 09/28/17 0650  Weight: 124.4 kg (274 lb 3.2 oz) 124.2 kg (273 lb 14.4 oz) 124 kg (273 lb 5.9 oz)    Examination:  General exam: Appears calm and comfortable  Respiratory system: Clear to auscultation. Respiratory effort normal. Cardiovascular system: S1 & S2 heard, RRR. No JVD, murmurs, rubs, gallops or clicks. No pedal edema. Gastrointestinal system: Abdomen is obese, soft, appears to be less tender today. No organomegaly or masses felt. Normal bowel sounds heard. Central nervous system: Alert and  oriented. No focal neurological deficits. Extremities: Symmetric 5 x 5 power. Skin: No rashes, lesions or ulcers Psychiatry: Judgement and insight appear normal. Mood & affect appropriate.     Data Reviewed: I have personally reviewed following labs and imaging studies  CBC: Recent Labs  Lab 09/26/17 1726 09/27/17 0508 09/28/17 0530  WBC 17.9* 15.5* 11.7*  NEUTROABS  --   --  7.2  HGB 15.3* 13.8 12.7  HCT 44.8 41.0 39.1  MCV 85.7 86.0 87.3  PLT 361 285 099   Basic Metabolic Panel: Recent Labs  Lab 09/26/17 1726 09/27/17 0508  NA 133* 134*  K 4.2 3.7  CL 96* 99*  CO2 26 26  GLUCOSE 106* 130*  BUN 18 19  CREATININE 0.61 0.65  CALCIUM 9.8 8.8*   GFR: Estimated Creatinine Clearance: 106.9 mL/min (by C-G formula based on SCr of 0.65 mg/dL). Liver Function Tests: Recent Labs  Lab 09/26/17 1726 09/27/17 0508  AST 21 20  ALT 26 21  ALKPHOS 83 70  BILITOT 0.7 0.5  PROT 8.4* 7.1  ALBUMIN 4.5 3.7   Recent Labs  Lab 09/26/17 1726  LIPASE 47   No results for input(s): AMMONIA in the last 168 hours. Coagulation Profile: No results for input(s): INR, PROTIME in the last 168 hours. Cardiac Enzymes: No results for input(s): CKTOTAL, CKMB, CKMBINDEX, TROPONINI in the last 168 hours. BNP (last 3 results) No results for input(s): PROBNP in the last 8760 hours. HbA1C: No results for input(s): HGBA1C in the last 72 hours. CBG: Recent Labs  Lab 09/27/17 1135 09/27/17 1610 09/27/17 2106 09/28/17 0744 09/28/17 1321  GLUCAP 119* 90 101* 116* 115*   Lipid Profile: No results for input(s): CHOL, HDL, LDLCALC, TRIG, CHOLHDL, LDLDIRECT in the last 72 hours. Thyroid Function Tests: Recent Labs    09/27/17 0508  TSH 5.142*   Anemia Panel: No results for input(s): VITAMINB12, FOLATE, FERRITIN, TIBC, IRON, RETICCTPCT in the last 72 hours. Sepsis Labs: No results for input(s): PROCALCITON, LATICACIDVEN in the last 168 hours.  No results found for this or any  previous visit (from the past 240 hour(s)).       Radiology Studies: X-ray  Chest Pa And Lateral  Result Date: 09/27/2017 CLINICAL DATA:  Acute onset of leukocytosis. EXAM: CHEST  2 VIEW COMPARISON:  Chest radiograph performed 06/02/2017 FINDINGS: The lungs are well-aerated. Minimal right basilar opacity may reflect atelectasis or possibly mild infection. There is no evidence of pleural effusion or pneumothorax. The heart is normal in size; the mediastinal contour is within normal limits. No acute osseous abnormalities are seen. Calcifications overlying the right humeral head likely reflect calcific tendinitis. IMPRESSION: 1. Minimal right basilar opacity may reflect atelectasis or possibly mild infection. 2. Calcifications overlying the right humeral head likely reflect calcific tendinitis. Electronically Signed   By: Garald Balding M.D.   On: 09/27/2017 00:03   Ct Abdomen Pelvis W Contrast  Result Date: 09/26/2017 CLINICAL DATA:  Nausea, and vomiting, lower abdomen pain for 3 weeks. EXAM: CT ABDOMEN AND PELVIS WITH CONTRAST TECHNIQUE: Multidetector CT imaging of the abdomen and pelvis was performed using the standard protocol following bolus administration of intravenous contrast. CONTRAST:  146mL ISOVUE-300 IOPAMIDOL (ISOVUE-300) INJECTION 61% COMPARISON:  September 07, 2017 FINDINGS: Lower chest: No acute abnormality. Hepatobiliary: No focal liver abnormality is seen. Status post cholecystectomy. No biliary dilatation. There is diffuse low density of the liver. Pancreas: Unremarkable. No pancreatic ductal dilatation or surrounding inflammatory changes. Spleen: Normal in size without focal abnormality. Adrenals/Urinary Tract: Adrenal glands are unremarkable. Kidneys are normal, without renal calculi, focal lesion, or hydronephrosis. Bladder is unremarkable. Stomach/Bowel: The stomach is normal. There is no small bowel obstruction. There is diverticulosis of the colon. There is question diffuse thick  wall sigmoid in a decompressed sigmoid. The appendix is normal. Vascular/Lymphatic: No significant vascular findings are present. No enlarged abdominal or pelvic lymph nodes. Reproductive: Status post hysterectomy. The bilateral adnexa are not changed compared prior exam. Other: None. Musculoskeletal: Degenerative joint changes of the spine are noted. IMPRESSION: No acute abnormality is identified in the abdomen. Question diffuse bowel wall thickening of the sigmoid colon in a decompressed sigmoid. Recommend further evaluation with barium enema on outpatient basis to exclude bowel wall thickening. Fatty infiltration of liver. Prior cholecystectomy and hysterectomy. Electronically Signed   By: Abelardo Diesel M.D.   On: 09/26/2017 18:55        Scheduled Meds: . aspirin EC  81 mg Oral Daily  . DULoxetine  60 mg Oral BID  . lisinopril  20 mg Oral Daily   And  . hydrochlorothiazide  12.5 mg Oral Daily  . Influenza vac split quadrivalent PF  0.5 mL Intramuscular Tomorrow-1000  . insulin aspart  0-9 Units Subcutaneous TID WC  . levothyroxine  100 mcg Oral QAC breakfast  . meperidine      . midazolam      . montelukast  10 mg Oral QHS  . multivitamin with minerals  1 tablet Oral Daily  . pantoprazole  40 mg Oral Daily  . pneumococcal 23 valent vaccine  0.5 mL Intramuscular Tomorrow-1000  . saccharomyces boulardii  250 mg Oral BID   Continuous Infusions: . ampicillin-sulbactam (UNASYN) IV Stopped (09/28/17 5366)     LOS: 2 days    Time spent: 29mins    Kathie Dike, MD [facility] Pager [phone]  If 7PM-7AM, please contact night-coverage [email or web address] Password [email or web address] 09/28/2017, 6:02 PM

## 2017-09-29 LAB — GLUCOSE, CAPILLARY
Glucose-Capillary: 113 mg/dL — ABNORMAL HIGH (ref 65–99)
Glucose-Capillary: 156 mg/dL — ABNORMAL HIGH (ref 65–99)
Glucose-Capillary: 96 mg/dL (ref 65–99)

## 2017-09-29 LAB — CBC
HCT: 37.3 % (ref 36.0–46.0)
Hemoglobin: 12.2 g/dL (ref 12.0–15.0)
MCH: 28.1 pg (ref 26.0–34.0)
MCHC: 32.7 g/dL (ref 30.0–36.0)
MCV: 85.9 fL (ref 78.0–100.0)
PLATELETS: 269 10*3/uL (ref 150–400)
RBC: 4.34 MIL/uL (ref 3.87–5.11)
RDW: 14.1 % (ref 11.5–15.5)
WBC: 12.4 10*3/uL — ABNORMAL HIGH (ref 4.0–10.5)

## 2017-09-29 MED ORDER — INFLUENZA VAC SPLIT QUAD 0.5 ML IM SUSY
0.5000 mL | PREFILLED_SYRINGE | Freq: Once | INTRAMUSCULAR | Status: AC
  Administered 2017-09-29: 0.5 mL via INTRAMUSCULAR
  Filled 2017-09-29: qty 0.5

## 2017-09-29 MED ORDER — PNEUMOCOCCAL VAC POLYVALENT 25 MCG/0.5ML IJ INJ
0.5000 mL | INJECTION | Freq: Once | INTRAMUSCULAR | Status: AC
  Administered 2017-09-29: 0.5 mL via INTRAMUSCULAR
  Filled 2017-09-29: qty 0.5

## 2017-09-29 MED ORDER — LISINOPRIL-HYDROCHLOROTHIAZIDE 20-12.5 MG PO TABS: 1.0000 | ORAL_TABLET | Freq: Every day | ORAL | 11 refills | Status: DC

## 2017-09-29 MED ORDER — INSULIN ASPART 100 UNIT/ML ~~LOC~~ SOLN
0.0000 [IU] | Freq: Three times a day (TID) | SUBCUTANEOUS | Status: DC
  Administered 2017-09-29: 3 [IU] via SUBCUTANEOUS

## 2017-09-29 MED ORDER — AMOXICILLIN-POT CLAVULANATE 875-125 MG PO TABS: 1.0000 | ORAL_TABLET | Freq: Two times a day (BID) | ORAL | 0 refills | Status: DC

## 2017-09-29 MED ORDER — INSULIN ASPART 100 UNIT/ML ~~LOC~~ SOLN: 0.0000 [IU] | Freq: Every day | SUBCUTANEOUS | Status: DC

## 2017-09-29 NOTE — Progress Notes (Signed)
Stated has had two loose bms this morning.  Requested flu and pneumonia vaccines and giving them now.

## 2017-09-29 NOTE — Plan of Care (Signed)
Eating with no nausea or pain.  Had bm last night

## 2017-09-29 NOTE — Discharge Summary (Addendum)
Physician Discharge Summary  Kristin Bailey TML:[id number] DOB: [birth year] DOA: 09/26/2017  PCP: Rory Percy, MD  Admit date: 09/26/2017 Discharge date: 09/29/2017  Admitted From: Home Disposition: Home  Recommendations for Outpatient Follow-up:  1. Follow up with PCP in 1-2 weeks 2. Please obtain BMP/CBC in one week 3. Patient will follow-up with GI as an outpatient 4. Outpatient barium enema will be arranged by GI. 5. Follow-up with general surgery as needed  Home Health: Equipment/Devices:  Discharge Condition: Stable CODE STATUS: Full code Diet recommendation: Heart Healthy / Carb Modified   Brief/Interim Summary: 56 year old female with a history of hypertension, diabetes, irritable bowel syndrome, admitted to the hospital with abdominal pain of unclear etiology.  She had associated nausea and vomiting.  CT scan indicated possible diverticulitis, although this is felt to be less likely after reviewed by GI.     Discharge Diagnoses:  Active Problems:   Leukocytosis   Controlled type 2 diabetes mellitus without complication, without long-term current use of insulin (HCC)   Diverticulitis   Hyponatremia   Abdominal pain, left lower quadrant   Morbid Obesity  1. Abdominal pain.  Etiology is unclear.  She was noted to have thickening of sigmoid colon on recent CT imaging.  Seen by Dr. Laural Golden who did not feel that this was diverticulitis related.  She had diffuse abdominal pain, nausea and vomiting.  She has intermittently had these symptoms for quite some time and has undergone extensive workup.  Patient underwent flexible sigmoidoscopy that showed mild colitis, unlikely to explain patient's symptoms.  She was also seen by general surgery who wish to pursue barium enema study prior to considering exploratory laparotomy.  Biopsies were done during sigmoidoscopy which did not have any specific findings.   Diet was advanced after her procedure and she appears to be tolerating  this.  Overall abdominal pain has improved and nausea and vomiting has resolved.  She is tolerating a normal diet at this time.  She was noted to have a leukocytosis on admission, treated with intravenous antibiotics.  This has since improved.  She is been transitioned to oral Augmentin to complete her course.  She will follow-up with GI as an outpatient and barium enema will be arranged in the next 2-4 weeks. 2. GERD.  Continue on PPI 3. Diabetes.  Blood sugar stable.    Resume Trulicity and glipizide currently on discharge.  4. Hypertension.  Continue on lisinopril and hydrochlorothiazide. 5. Anxiety.  Continue on Xanax as needed and Cymbalta 6. Hypothyroidism.  Continue on Synthroid 7. Morbid obesity.  Counseled on the importance of diet and exercise.     Discharge Instructions  Discharge Instructions    Diet - low sodium heart healthy   Complete by:  As directed    Increase activity slowly   Complete by:  As directed      Allergies as of 09/29/2017   No Known Allergies     Medication List    STOP taking these medications   ciprofloxacin 500 MG tablet Commonly known as:  CIPRO   metroNIDAZOLE 500 MG tablet Commonly known as:  FLAGYL     TAKE these medications   albuterol 108 (90 Base) MCG/ACT inhaler Commonly known as:  PROVENTIL HFA;VENTOLIN HFA Inhale 2 puffs into the lungs every 4 (four) hours as needed for wheezing or shortness of breath.   ALPRAZolam 0.25 MG tablet Commonly known as:  XANAX Take 0.25 mg by mouth 2 (two) times daily as needed for anxiety.   amoxicillin-clavulanate  875-125 MG tablet Commonly known as:  AUGMENTIN Take 1 tablet 2 (two) times daily by mouth.   aspirin EC 81 MG tablet Take 81 mg by mouth daily.   DULoxetine 60 MG capsule Commonly known as:  CYMBALTA Take 1 capsule (60 mg total) by mouth every morning. What changed:  when to take this   glipiZIDE 5 MG tablet Commonly known as:  GLUCOTROL Take 5 mg by mouth 2 (two) times  daily.   HYDROcodone-acetaminophen 5-325 MG tablet Commonly known as:  NORCO/VICODIN Take 1 tablet by mouth every 4 (four) hours as needed.   levothyroxine 100 MCG tablet Commonly known as:  SYNTHROID, LEVOTHROID Take 100 mcg daily by mouth.   lisinopril-hydrochlorothiazide 20-12.5 MG tablet Commonly known as:  PRINZIDE,ZESTORETIC Take 1 tablet daily by mouth. Resume in 3 days What changed:  additional instructions   montelukast 10 MG tablet Commonly known as:  SINGULAIR Take 10 mg by mouth at bedtime.   multivitamin with minerals Tabs tablet Take 1 tablet by mouth daily.   omeprazole 20 MG tablet Commonly known as:  PRILOSEC OTC Take 1 tablet (20 mg total) by mouth daily. What changed:    when to take this  additional instructions   ondansetron 4 MG tablet Commonly known as:  ZOFRAN Take 1 tablet (4 mg total) by mouth every 8 (eight) hours as needed for nausea or vomiting.   OVER THE COUNTER MEDICATION Patient is taking CBD Oil - 1-2 drops under tongue, patient will take when her tummy hurts. PRN   traZODone 100 MG tablet Commonly known as:  DESYREL 100-150 mg at night as needed for sleep What changed:    how much to take  how to take this  when to take this  reasons to take this  additional instructions   TRULICITY 8.18 EX/9.3ZJ Sopn Generic drug:  Dulaglutide Inject 0.5 mLs into the skin every Monday. On  Monday's.   TYLENOL 8 HOUR ARTHRITIS PAIN 650 MG CR tablet Generic drug:  acetaminophen Take 1,300 mg by mouth every morning. *May take additional as needed   valACYclovir 1000 MG tablet Commonly known as:  VALTREX Take 2 g 2 (two) times daily by mouth.   vitamin C 1000 MG tablet Take 1,000 mg 2 (two) times daily by mouth.       No Known Allergies  Consultations:  Gastroenterology  General surgery   Procedures/Studies: X-ray Chest Pa And Lateral  Result Date: 09/27/2017 CLINICAL DATA:  Acute onset of leukocytosis. EXAM: CHEST  2  VIEW COMPARISON:  Chest radiograph performed 06/02/2017 FINDINGS: The lungs are well-aerated. Minimal right basilar opacity may reflect atelectasis or possibly mild infection. There is no evidence of pleural effusion or pneumothorax. The heart is normal in size; the mediastinal contour is within normal limits. No acute osseous abnormalities are seen. Calcifications overlying the right humeral head likely reflect calcific tendinitis. IMPRESSION: 1. Minimal right basilar opacity may reflect atelectasis or possibly mild infection. 2. Calcifications overlying the right humeral head likely reflect calcific tendinitis. Electronically Signed   By: Garald Balding M.D.   On: 09/27/2017 00:03   Ct Abdomen Pelvis W Contrast  Result Date: 09/26/2017 CLINICAL DATA:  Nausea, and vomiting, lower abdomen pain for 3 weeks. EXAM: CT ABDOMEN AND PELVIS WITH CONTRAST TECHNIQUE: Multidetector CT imaging of the abdomen and pelvis was performed using the standard protocol following bolus administration of intravenous contrast. CONTRAST:  138mL ISOVUE-300 IOPAMIDOL (ISOVUE-300) INJECTION 61% COMPARISON:  September 07, 2017 FINDINGS: Lower chest: No acute  abnormality. Hepatobiliary: No focal liver abnormality is seen. Status post cholecystectomy. No biliary dilatation. There is diffuse low density of the liver. Pancreas: Unremarkable. No pancreatic ductal dilatation or surrounding inflammatory changes. Spleen: Normal in size without focal abnormality. Adrenals/Urinary Tract: Adrenal glands are unremarkable. Kidneys are normal, without renal calculi, focal lesion, or hydronephrosis. Bladder is unremarkable. Stomach/Bowel: The stomach is normal. There is no small bowel obstruction. There is diverticulosis of the colon. There is question diffuse thick wall sigmoid in a decompressed sigmoid. The appendix is normal. Vascular/Lymphatic: No significant vascular findings are present. No enlarged abdominal or pelvic lymph nodes. Reproductive:  Status post hysterectomy. The bilateral adnexa are not changed compared prior exam. Other: None. Musculoskeletal: Degenerative joint changes of the spine are noted. IMPRESSION: No acute abnormality is identified in the abdomen. Question diffuse bowel wall thickening of the sigmoid colon in a decompressed sigmoid. Recommend further evaluation with barium enema on outpatient basis to exclude bowel wall thickening. Fatty infiltration of liver. Prior cholecystectomy and hysterectomy. Electronically Signed   By: Abelardo Diesel M.D.   On: 09/26/2017 18:55   Ct Abdomen Pelvis W Contrast  Result Date: 09/07/2017 CLINICAL DATA:  Patient with abdominal cramping and nausea. EXAM: CT ABDOMEN AND PELVIS WITH CONTRAST TECHNIQUE: Multidetector CT imaging of the abdomen and pelvis was performed using the standard protocol following bolus administration of intravenous contrast. CONTRAST:  12mL ISOVUE-300 IOPAMIDOL (ISOVUE-300) INJECTION 61% COMPARISON:  CT abdomen pelvis 11/04/2016 FINDINGS: Lower chest: Normal heart size. No pericardial effusion. Dependent atelectasis within the lower lobes bilaterally. Hepatobiliary: Liver is diffusely low in attenuation compatible with steatosis. No focal hepatic lesion is identified. Gallbladder surgically absent. No intrahepatic or extrahepatic biliary ductal dilatation. Pancreas: Unremarkable Spleen: Unremarkable Adrenals/Urinary Tract: Normal adrenal gland. Kidneys are symmetric in size. No hydronephrosis. Stomach/Bowel: Descending and sigmoid colonic diverticulosis. There is mild wall thickening and fat stranding about the sigmoid colon. No evidence for bowel obstruction. Normal morphology of the stomach. No free fluid or free intraperitoneal air. Vascular/Lymphatic: Normal caliber abdominal aorta. No retroperitoneal lymphadenopathy. Reproductive: Uterus and adnexal structures are unremarkable. Other: None. Musculoskeletal: Probable bone island within the right ilium. Lumbar spine  degenerative changes. IMPRESSION: 1. Mild fat stranding about the sigmoid colon raising the possibility of mild acute sigmoid diverticulitis. 2. Hepatic steatosis. Electronically Signed   By: Lovey Newcomer M.D.   On: 09/07/2017 17:25    Flexible sigmoidoscopy:- Erythematous granular mucosa in the mid sigmoid  colon. Biopsied. - Diverticulosis in the sigmoid colon. - Internal hemorrhoids. Comment: Mild segmental colitis at sigmoid colon.  This finding would not explain patient's symptom  complex. No colonic stricture noted. Barium enema to be performedas outpatient.   Subjective: Patient is feeling better.  No further vomiting.  Tolerated eating a sandwich from [employer] earlier today.  Discharge Exam: Vitals:   09/29/17 0845 09/29/17 1326  BP: (!) 109/56 111/63  Pulse: 78 84  Resp:  16  Temp:  97.9 F (36.6 C)  SpO2:  98%   Vitals:   09/29/17 0254 09/29/17 0613 09/29/17 0845 09/29/17 1326  BP: (!) 115/56 124/66 (!) 109/56 111/63  Pulse: 79 81 78 84  Resp:  17  16  Temp:    97.9 F (36.6 C)  TempSrc:  Oral  Axillary  SpO2:  100%  98%  Weight:  127 kg (279 lb 15.8 oz)    Height:        General: Pt is alert, awake, not in acute distress Cardiovascular: RRR, S1/S2 +, no rubs,  no gallops Respiratory: CTA bilaterally, no wheezing, no rhonchi Abdominal: Soft, NT, ND, bowel sounds + Extremities: no edema, no cyanosis    The results of significant diagnostics from this hospitalization (including imaging, microbiology, ancillary and laboratory) are listed below for reference.     Microbiology: No results found for this or any previous visit (from the past 240 hour(s)).   Labs: BNP (last 3 results) No results for input(s): BNP in the last 8760 hours. Basic Metabolic  Panel: Recent Labs  Lab 09/26/17 1726 09/27/17 0508  NA 133* 134*  K 4.2 3.7  CL 96* 99*  CO2 26 26  GLUCOSE 106* 130*  BUN 18 19  CREATININE 0.61 0.65  CALCIUM 9.8 8.8*   Liver Function Tests: Recent Labs  Lab 09/26/17 1726 09/27/17 0508  AST 21 20  ALT 26 21  ALKPHOS 83 70  BILITOT 0.7 0.5  PROT 8.4* 7.1  ALBUMIN 4.5 3.7   Recent Labs  Lab 09/26/17 1726  LIPASE 47   No results for input(s): AMMONIA in the last 168 hours. CBC: Recent Labs  Lab 09/26/17 1726 09/27/17 0508 09/28/17 0530 09/29/17 1305  WBC 17.9* 15.5* 11.7* 12.4*  NEUTROABS  --   --  7.2  --   HGB 15.3* 13.8 12.7 12.2  HCT 44.8 41.0 39.1 37.3  MCV 85.7 86.0 87.3 85.9  PLT 361 285 266 269   Cardiac Enzymes: No results for input(s): CKTOTAL, CKMB, CKMBINDEX, TROPONINI in the last 168 hours. BNP: Invalid input(s): POCBNP CBG: Recent Labs  Lab 09/28/17 1321 09/28/17 2214 09/29/17 0746 09/29/17 1140 09/29/17 1634  GLUCAP 115* 143* 113* 156* 96   D-Dimer No results for input(s): DDIMER in the last 72 hours. Hgb A1c No results for input(s): HGBA1C in the last 72 hours. Lipid Profile No results for input(s): CHOL, HDL, LDLCALC, TRIG, CHOLHDL, LDLDIRECT in the last 72 hours. Thyroid function studies Recent Labs    09/27/17 0508  TSH 5.142*   Anemia work up No results for input(s): VITAMINB12, FOLATE, FERRITIN, TIBC, IRON, RETICCTPCT in the last 72 hours. Urinalysis    Component Value Date/Time   COLORURINE YELLOW 09/26/2017 1716   APPEARANCEUR CLEAR 09/26/2017 1716   LABSPEC 1.020 09/26/2017 1716   PHURINE 5.0 09/26/2017 [place] 09/26/2017 [place] 09/26/2017 [place] 09/26/2017 [place] 09/26/2017 1716   PROTEINUR NEGATIVE 09/26/2017 1716   UROBILINOGEN 0.2 08/30/2015 1420   NITRITE NEGATIVE 09/26/2017 [place] 09/26/2017 1716   Sepsis Labs Invalid input(s): PROCALCITONIN,  WBC,   LACTICIDVEN Microbiology No results found for this or any previous visit (from the past 240 hour(s)).   Time coordinating discharge: Over 30 minutes  SIGNED:   Kathie Dike, MD  [facility] 09/29/2017, 5:16 PM Pager   If 7PM-7AM, please contact night-coverage [email or web address] Password [email or web address]
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
## 2017-09-29 NOTE — Progress Notes (Signed)
IV removed with no bleeding and bandaid placed..  Given discharge papers with education and new scripts sent to pharmacy per MD.  Hanley Seamen work note.  Instructions given on when to take all medications and follow up appointments.  To be transported by wheelchair to car.  Husband to drive home.

## 2017-09-29 NOTE — Progress Notes (Signed)
[facility]  No major complaints. Tolerating diet. Wanting a sausage, egg, and cheese mcmuffin. Having Bms. Dr. Laural Golden did a sigmoidoscopy with biopsy with minimal colitis per his report and imaging.   At this time patient reporting she wants to get second opinion from GI, and I still agree with getting enema in the future to assess for anything that could not be appreciated on sigmoidoscopy.   Can see her in the future as needed based on what she decides and based on enema results, etc.  Updated Dr. Roderic Palau,  Curlene Labrum, MD [facility] [address] [place], [place] [address] [phone] (office)

## 2017-09-29 NOTE — Progress Notes (Signed)
  Subjective:  Patient feels better.  She had a formed stool this morning followed by diarrhea but then she ate a meal from [employer] and also drank a glass of frappe.  She remains with mid abdominal pain but it is not like it was 3 days ago.  Patient's husband tells me that her Cymbalta dose has been increased because depression was not controlled.  She is seeing a psychiatrist.  He wonders what role stress is playing in her illness. Her husband is also wondering if she has slow stomach.  However she had normal gastric emptying study and June last year.  Objective: Blood pressure 111/63, pulse 84, temperature 97.9 F (36.6 C), temperature source Axillary, resp. rate 16, height 5\' 6"  (1.676 m), weight 279 lb 15.8 oz (127 kg), last menstrual period 03/27/2014, SpO2 98 %. Patient is alert and in no acute distress. Abdomen is full.  It is soft with mild umbilical tenderness. No LE edema or clubbing noted.  Labs/studies Results:  Recent Labs    11-Oct-2017 0508 09/28/17 0530 09/29/17 1305  WBC 15.5* 11.7* 12.4*  HGB 13.8 12.7 12.2  HCT 41.0 39.1 37.3  PLT 285 266 269    BMET  Recent Labs    09/26/17 1726 10/11/2017 0508  NA 133* 134*  K 4.2 3.7  CL 96* 99*  CO2 26 26  GLUCOSE 106* 130*  BUN 18 19  CREATININE 0.61 0.65  CALCIUM 9.8 8.8*    LFT  Recent Labs    09/26/17 1726 2017-10-11 0508  PROT 8.4* 7.1  ALBUMIN 4.5 3.7  AST 21 20  ALT 26 21  ALKPHOS 60 70  BILITOT 0.7 0.5    Sigmoid colon biopsy reveals mild acute colitis without any specific features for IBD.  IgE is 10.  Assessment:  #1.  Symptom complex consisting of nausea vomiting and abdominal pain with diarrhea(except she was constipated this time) remains unexplained.  She has very mild focal colitis of sigmoid colon which would not explain her illness.  Some of her symptoms could be due to IBS leukocytosis would be hard to explain on that basis.  She is on trazodone and Cymbalta which generally would help IBS  symptoms.  She appears to be doing well and ready for discharge.    Recommendations:  Stable for discharge. Continue antibiotic for another 4 days.  To be switched to Augmentin as discussed with Dr. Roderic Palau. Schedule patient for single contrast barium enema in 2-4 weeks. Office visit after barium enema completed.

## 2017-09-29 NOTE — Progress Notes (Signed)
Up in chair this morning eating breakfast.  Denies pain and nausea.  Asked husband to bring her outside food. Stated had a bm last night which was partly formed and loose as well.

## 2017-09-29 NOTE — Plan of Care (Signed)
Education complete on new medications and follow up appts

## 2017-10-02 ENCOUNTER — Encounter (HOSPITAL_COMMUNITY): Payer: Self-pay | Admitting: Internal Medicine

## 2017-10-04 ENCOUNTER — Ambulatory Visit (HOSPITAL_COMMUNITY): Payer: Self-pay | Admitting: Psychiatry

## 2017-10-11 ENCOUNTER — Other Ambulatory Visit (INDEPENDENT_AMBULATORY_CARE_PROVIDER_SITE_OTHER): Payer: Self-pay | Admitting: Internal Medicine

## 2017-10-11 ENCOUNTER — Other Ambulatory Visit: Payer: Self-pay

## 2017-10-11 ENCOUNTER — Encounter (HOSPITAL_COMMUNITY): Payer: Self-pay | Admitting: Emergency Medicine

## 2017-10-11 ENCOUNTER — Emergency Department (HOSPITAL_COMMUNITY)
Admission: EM | Admit: 2017-10-11 | Discharge: 2017-10-11 | Disposition: A | Discharge status: Home / Self Care | Payer: BLUE CROSS/BLUE SHIELD | Attending: Emergency Medicine | Admitting: Emergency Medicine

## 2017-10-11 DIAGNOSIS — Z7984 Long term (current) use of oral hypoglycemic drugs: Secondary | ICD-10-CM | POA: Insufficient documentation

## 2017-10-11 DIAGNOSIS — I10 Essential (primary) hypertension: Secondary | ICD-10-CM | POA: Diagnosis not present

## 2017-10-11 DIAGNOSIS — D72829 Elevated white blood cell count, unspecified: Secondary | ICD-10-CM | POA: Diagnosis not present

## 2017-10-11 DIAGNOSIS — R112 Nausea with vomiting, unspecified: Secondary | ICD-10-CM | POA: Diagnosis not present

## 2017-10-11 DIAGNOSIS — Z7982 Long term (current) use of aspirin: Secondary | ICD-10-CM | POA: Insufficient documentation

## 2017-10-11 DIAGNOSIS — Z79899 Other long term (current) drug therapy: Secondary | ICD-10-CM | POA: Diagnosis not present

## 2017-10-11 DIAGNOSIS — R109 Unspecified abdominal pain: Secondary | ICD-10-CM

## 2017-10-11 DIAGNOSIS — E119 Type 2 diabetes mellitus without complications: Secondary | ICD-10-CM | POA: Diagnosis not present

## 2017-10-11 DIAGNOSIS — R1010 Upper abdominal pain, unspecified: Secondary | ICD-10-CM | POA: Insufficient documentation

## 2017-10-11 LAB — CBC WITH DIFFERENTIAL/PLATELET
BASOS ABS: 0 10*3/uL (ref 0.0–0.1)
BASOS PCT: 0 %
EOS PCT: 1 %
Eosinophils Absolute: 0.2 10*3/uL (ref 0.0–0.7)
HCT: 45.8 % (ref 36.0–46.0)
Hemoglobin: 14.5 g/dL (ref 12.0–15.0)
LYMPHS PCT: 12 %
Lymphs Abs: 2.8 10*3/uL (ref 0.7–4.0)
MCH: 27.8 pg (ref 26.0–34.0)
MCHC: 31.7 g/dL (ref 30.0–36.0)
MCV: 87.7 fL (ref 78.0–100.0)
MONO ABS: 1.2 10*3/uL — AB (ref 0.1–1.0)
Monocytes Relative: 5 %
Neutro Abs: 18.3 10*3/uL — ABNORMAL HIGH (ref 1.7–7.7)
Neutrophils Relative %: 82 %
PLATELETS: 329 10*3/uL (ref 150–400)
RBC: 5.22 MIL/uL — AB (ref 3.87–5.11)
RDW: 14.5 % (ref 11.5–15.5)
WBC: 22.4 10*3/uL — AB (ref 4.0–10.5)

## 2017-10-11 LAB — COMPREHENSIVE METABOLIC PANEL
ALK PHOS: 96 U/L (ref 38–126)
ALT: 38 U/L (ref 14–54)
AST: 28 U/L (ref 15–41)
Albumin: 4.4 g/dL (ref 3.5–5.0)
Anion gap: 9 (ref 5–15)
BILIRUBIN TOTAL: 0.7 mg/dL (ref 0.3–1.2)
BUN: 13 mg/dL (ref 6–20)
CO2: 27 mmol/L (ref 22–32)
CREATININE: 0.6 mg/dL (ref 0.44–1.00)
Calcium: 9.3 mg/dL (ref 8.9–10.3)
Chloride: 99 mmol/L — ABNORMAL LOW (ref 101–111)
GFR calc Af Amer: >60 mL/min (ref >60)
Glucose, Bld: 157 mg/dL — ABNORMAL HIGH (ref 65–99)
Potassium: 4 mmol/L (ref 3.5–5.1)
Sodium: 135 mmol/L (ref 135–145)
TOTAL PROTEIN: 8.3 g/dL — AB (ref 6.5–8.1)

## 2017-10-11 LAB — URINALYSIS, ROUTINE W REFLEX MICROSCOPIC
Bilirubin Urine: NEGATIVE
GLUCOSE, UA: NEGATIVE mg/dL
Hgb urine dipstick: NEGATIVE
KETONES UR: NEGATIVE mg/dL
LEUKOCYTES UA: NEGATIVE
NITRITE: NEGATIVE
PROTEIN: NEGATIVE mg/dL
Specific Gravity, Urine: 1.014 (ref 1.005–1.030)
pH: 5 (ref 5.0–8.0)

## 2017-10-11 LAB — LIPASE, BLOOD: LIPASE: 43 U/L (ref 11–51)

## 2017-10-11 MED ORDER — METOCLOPRAMIDE HCL 5 MG/ML IJ SOLN
10.0000 mg | Freq: Once | INTRAMUSCULAR | Status: AC
  Administered 2017-10-11: 10 mg via INTRAVENOUS
  Filled 2017-10-11: qty 2

## 2017-10-11 MED ORDER — MORPHINE SULFATE (PF) 4 MG/ML IV SOLN
4.0000 mg | Freq: Once | INTRAVENOUS | Status: AC
  Administered 2017-10-11: 4 mg via INTRAVENOUS
  Filled 2017-10-11: qty 1

## 2017-10-11 MED ORDER — DIPHENHYDRAMINE HCL 50 MG/ML IJ SOLN
25.0000 mg | Freq: Once | INTRAMUSCULAR | Status: AC
  Administered 2017-10-11: 25 mg via INTRAVENOUS
  Filled 2017-10-11: qty 1

## 2017-10-11 MED ORDER — OXYCODONE HCL 5 MG PO CAPS: 5.0000 mg | ORAL_CAPSULE | ORAL | 0 refills | Status: DC | PRN

## 2017-10-11 MED ORDER — METOCLOPRAMIDE HCL 10 MG PO TABS: 10.0000 mg | ORAL_TABLET | Freq: Four times a day (QID) | ORAL | 0 refills | Status: DC | PRN

## 2017-10-11 MED ORDER — SODIUM CHLORIDE 0.9 % IV BOLUS (SEPSIS)
1000.0000 mL | Freq: Once | INTRAVENOUS | Status: AC
  Administered 2017-10-11: 1000 mL via INTRAVENOUS

## 2017-10-11 NOTE — Discharge Instructions (Signed)
Stay on a clear liquid diet for 24 hours. Advance slowly after that, if you are tolerating it. Return if symptoms are not being adequately controlled at home.

## 2017-10-11 NOTE — ED Provider Notes (Signed)
[facility] EMERGENCY DEPARTMENT Provider Note   CSN: [id number] Arrival date & time: 10/11/17  2951     History   Chief Complaint Chief Complaint  Patient presents with  . Abdominal Pain    HPI Kristin Bailey is a 56 y.o. female.  The history is provided by the patient.  He has a history of diabetes, diverticulitis, GERD, hypertension, irritable bowel syndrome and comes in with recurrent episode of mid abdominal pain, nausea, vomiting.  She had a recent hospital admission at which time she had CT scan and sigmoidoscopy.  She was doing reasonably well until yesterday when she developed nausea.  This evening, she has developed vomiting and abdominal pain.  She is also had some loose bowel movements.  She rates pain at 8/10.  She denies fever, chills, sweats.  There is no blood or mucus in stool or emesis.  She has taken ondansetron at home which is given her slight, temporary relief of nausea.  Past Medical History:  Diagnosis Date  . Depression   . Diabetes mellitus without complication ([place])   . Diverticulitis   . GERD (gastroesophageal reflux disease)   . Hypertension   . IBS (irritable bowel syndrome)   . Pneumonia   . Thyroid disease     Patient Active Problem List   Diagnosis Date Noted  . Abdominal pain, left lower quadrant   . Diverticulitis 09/26/2017  . Hyponatremia 09/26/2017  . Noninfectious gastroenteritis 11/28/2016  . Enteritis 11/04/2016  . Lactic acidosis 11/04/2016  . Enterocolitis 03/17/2016  . Gastroenteritis, infectious 03/17/2016  . Leukocytosis 11/28/2015  . Acute respiratory failure with hypoxia ([place]) 11/28/2015  . Controlled type 2 diabetes mellitus without complication, without long-term current use of insulin ([place]) 11/28/2015  . UTI (lower urinary tract infection) 11/27/2015  . Colitis 11/27/2015  . AKI (acute kidney injury) ([place]) 11/27/2015    Past Surgical History:  Procedure Laterality Date  . AGILE CAPSULE N/A 11/23/2016   Procedure: AGILE CAPSULE;  Surgeon: Rogene Houston, MD;  [facility];  Service: Endoscopy;  Laterality: N/A;  8:30  . CARPAL TUNNEL RELEASE    . CESAREAN SECTION    . CHOLECYSTECTOMY    . COLONOSCOPY N/A 08/28/2014   Procedure: COLONOSCOPY;  Surgeon: Rogene Houston, MD;  [facility];  Service: Endoscopy;  Laterality: N/A;  1030  . CYST REMOVAL HAND    . ESOPHAGOGASTRODUODENOSCOPY N/A 03/19/2016   Procedure: ESOPHAGOGASTRODUODENOSCOPY (EGD);  Surgeon: Rogene Houston, MD;  [facility];  Service: Endoscopy;  Laterality: N/A;  . FLEXIBLE SIGMOIDOSCOPY N/A 09/28/2017   Procedure: FLEXIBLE SIGMOIDOSCOPY;  Surgeon: Rogene Houston, MD;  [facility];  Service: Endoscopy;  Laterality: N/A;  . FRACTURE SURGERY     rt ankle   . GIVENS CAPSULE STUDY N/A 12/15/2016   Procedure: GIVENS CAPSULE STUDY;  Surgeon: Rogene Houston, MD;  [facility];  Service: Endoscopy;  Laterality: N/A;  . KNEE SURGERY     meniscus repair  . TUBAL LIGATION      OB History    Gravida Para Term Preterm AB Living   1 1 1     1    SAB TAB Ectopic Multiple Live Births                   Home Medications    Prior to Admission medications   Medication Sig Start Date End Date Taking? Authorizing Provider  acetaminophen (TYLENOL 8 HOUR ARTHRITIS PAIN)  650 MG CR tablet Take 1,300 mg by mouth every morning. *May take additional as needed    [provider]  albuterol (PROVENTIL HFA;VENTOLIN HFA) 108 (90 BASE) MCG/ACT inhaler Inhale 2 puffs into the lungs every 4 (four) hours as needed for wheezing or shortness of breath.    [provider]  ALPRAZolam Duanne Moron) 0.25 MG tablet Take 0.25 mg by mouth 2 (two) times daily as needed for anxiety.    [provider]  amoxicillin-clavulanate (AUGMENTIN) 875-125 MG tablet Take 1 tablet 2 (two) times daily by mouth. 09/29/17   Kathie Dike, MD  Ascorbic Acid (VITAMIN C) 1000 MG tablet Take 1,000 mg 2  (two) times daily by mouth.    [provider]  aspirin EC 81 MG tablet Take 81 mg by mouth daily.    [provider]  Dulaglutide (TRULICITY) 0.93 GH/8.2XH SOPN Inject 0.5 mLs into the skin every Monday. On  Monday's.     [provider]  DULoxetine (CYMBALTA) 60 MG capsule Take 1 capsule (60 mg total) by mouth every morning. Patient taking differently: Take 60 mg 2 (two) times daily by mouth.  09/06/17   Norman Clay, MD  glipiZIDE (GLUCOTROL) 5 MG tablet Take 5 mg by mouth 2 (two) times daily.     [provider]  HYDROcodone-acetaminophen (NORCO/VICODIN) 5-325 MG tablet Take 1 tablet by mouth every 4 (four) hours as needed. 09/07/17   Tanna Furry, MD  levothyroxine (SYNTHROID, LEVOTHROID) 100 MCG tablet Take 100 mcg daily by mouth.  08/16/17   [provider]  lisinopril-hydrochlorothiazide (PRINZIDE,ZESTORETIC) 20-12.5 MG tablet Take 1 tablet daily by mouth. Resume in 3 days 09/29/17 09/23/18  Kathie Dike, MD  montelukast (SINGULAIR) 10 MG tablet Take 10 mg by mouth at bedtime.    [provider]  Multiple Vitamin (MULTIVITAMIN WITH MINERALS) TABS tablet Take 1 tablet by mouth daily.    [provider]  omeprazole (PRILOSEC OTC) 20 MG tablet Take 1 tablet (20 mg total) by mouth daily. Patient taking differently: Take 20 mg by mouth every other day. BEDTIME 08/30/15   Carmin Muskrat, MD  ondansetron (ZOFRAN) 4 MG tablet Take 1 tablet (4 mg total) by mouth every 8 (eight) hours as needed for nausea or vomiting. 11/16/16   Setzer, Rona Ravens, NP  OVER THE COUNTER MEDICATION Patient is taking CBD Oil - 1-2 drops under tongue, patient will take when her tummy hurts. PRN    [provider]  traZODone (DESYREL) 100 MG tablet 100-150 mg at night as needed for sleep Patient taking differently: Take 100-150 mg by mouth at bedtime as needed for sleep. 100-150 mg at night as needed for sleep 09/06/17   Norman Clay, MD  valACYclovir  (VALTREX) 1000 MG tablet Take 2 g 2 (two) times daily by mouth. 09/23/17   [provider]    Family History Family History  Problem Relation Age of Onset  . Cancer Mother        colon cancer age74  . Hypertension Mother   . Stroke Mother   . Diabetes Mother   . Non-Hodgkin's lymphoma Father   . Hypertension Father   . Diabetes Father   . Stroke Maternal Grandmother   . Kidney disease Paternal Aunt   . Cancer Paternal Aunt   . Kidney disease Paternal Uncle   . Cancer Paternal Uncle     Social History Social History   Tobacco Use  . Smoking status: Never Smoker  . Smokeless tobacco: Never Used  Substance Use Topics  . Alcohol use: No  . Drug use: No     Allergies   Patient has no known allergies.   Review of Systems Review of Systems  All other systems reviewed and are negative.    Physical Exam Updated Vital Signs BP 107/79 (BP Location: Left Arm)   Pulse 99   Temp 98.1 F (36.7 C) (Oral)   Resp 20   Ht 5\' 6"  (1.676 m)   Wt 126.6 kg (279 lb)   LMP 03/27/2014 Comment: irregular  SpO2 100%   BMI 45.03 kg/m   Physical Exam  Nursing note and vitals reviewed.  Morbidly obese 56 year old female, resting comfortably and in no acute distress. Vital signs are normal. Oxygen saturation is 100%, which is normal. Head is normocephalic and atraumatic. PERRLA, EOMI. Oropharynx is clear. Neck is nontender and supple without adenopathy or JVD. Back is nontender and there is no CVA tenderness. Lungs are clear without rales, wheezes, or rhonchi. Chest is nontender. Heart has regular rate and rhythm without murmur. Abdomen is soft, flat, with moderate tenderness in the midline in the mid and upper abdomen.  There is no rebound or guarding.  There are no masses or hepatosplenomegaly and peristalsis is hypoactive. Extremities have no cyanosis or edema, full range of motion is present. Skin is warm and dry without rash. Neurologic: Mental status is normal,  cranial nerves are intact, there are no motor or sensory deficits.  ED Treatments / Results  Labs (all labs ordered are listed, but only abnormal results are displayed) Labs Reviewed  COMPREHENSIVE METABOLIC PANEL - Abnormal; Notable for the following components:      Result Value   Chloride 99 (*)    Glucose, Bld 157 (*)    Total Protein 8.3 (*)    All other components within normal limits  CBC WITH DIFFERENTIAL/PLATELET - Abnormal; Notable for the following components:   WBC 22.4 (*)    RBC 5.22 (*)    Neutro Abs 18.3 (*)    Monocytes Absolute 1.2 (*)    All other components within normal limits  LIPASE, BLOOD  URINALYSIS, ROUTINE W REFLEX MICROSCOPIC   Procedures Procedures (including critical care time)  Medications Ordered in ED Medications - No data to display   Initial Impression / Assessment and Plan / ED Course  I have reviewed the triage vital signs and the nursing notes.  Pertinent labs & imaging results that were available during my care of the patient were reviewed by me and considered in my medical decision making (see chart for details).  Recurrent abdominal pain, nausea, vomiting.  Old records are reviewed confirming recent hospitalization for similar symptoms.  She has had 6 CT scans of her abdomen and last 2 years.  She will be given IV fluids, morphine, metoclopramide.  Last CT of abdomen was November 13.  In light of numerous unremarkable CT scans, will try to avoid CT scan today.  Past evaluations have suggested possible colitis.  Doubt diverticulitis.  Symptoms could also be exacerbation of irritable bowel syndrome or part of cyclic vomiting syndrome.  WBC is elevated to 22.4.  This is higher than most of her visits, but she has had WBCs as high as 32.  Following above-noted treatment, she was feeling considerably better.  She was still having some pain and was given a second dose of morphine with further improvement.  I discussed her condition with the  patient and her spouse.  At this point, I  am not sure there is indication for hospitalization.  Case was discussed with her gastroenterologist, Dr. Laural Golden, who is in agreement with attempting to treat as an outpatient and without antibiotics.  She is supposed to have an outpatient barium enema scheduled and he is trying to get her to be seen at either [facility] or [facility] for consultation.  After extensive discussion with patient and spouse, they are comfortable with going home with the understanding that they can return if symptoms worsen.  She is to stay on a clear liquid diet for the next 24 hours, then advance slowly as tolerated.  She is given prescriptions for metoclopramide and oxycodone.  Final Clinical Impressions(s) / ED Diagnoses   Final diagnoses:  Abdominal pain, unspecified abdominal location  Non-intractable vomiting with nausea, unspecified vomiting type  Leukocytosis, unspecified type    ED Discharge Orders        Ordered    metoCLOPramide (REGLAN) 10 MG tablet  Every 6 hours PRN     10/11/17 0806    oxycodone (OXY-IR) 5 MG capsule  Every 4 hours PRN     82/95/62 1308       Delora Fuel, MD 65/78/46 [phone]

## 2017-10-11 NOTE — ED Triage Notes (Signed)
Pt reports having abdominal pain that started last night. Pt states she had nausea all day yesterday. Pt says the pain is constant and shooting pain in the middle of her abdomen. Pt states she has been having indigestion since last night. Pt states she vomited before coming to the hospital one time. Pt states she has had loose stools since last time. Pt's last bowel movement was this morning before she came to the hospital.

## 2017-10-12 ENCOUNTER — Encounter (INDEPENDENT_AMBULATORY_CARE_PROVIDER_SITE_OTHER): Payer: Self-pay | Admitting: *Deleted

## 2017-10-12 ENCOUNTER — Other Ambulatory Visit (INDEPENDENT_AMBULATORY_CARE_PROVIDER_SITE_OTHER): Payer: Self-pay | Admitting: Internal Medicine

## 2017-10-12 DIAGNOSIS — R1084 Generalized abdominal pain: Secondary | ICD-10-CM

## 2017-10-18 ENCOUNTER — Ambulatory Visit (HOSPITAL_COMMUNITY)
Admission: RE | Admit: 2017-10-18 | Discharge: 2017-10-18 | Disposition: A | Discharge status: Home / Self Care | Payer: BLUE CROSS/BLUE SHIELD | Source: Ambulatory Visit | Attending: Internal Medicine | Admitting: Internal Medicine

## 2017-10-18 DIAGNOSIS — K5730 Diverticulosis of large intestine without perforation or abscess without bleeding: Secondary | ICD-10-CM | POA: Diagnosis not present

## 2017-10-18 DIAGNOSIS — R1084 Generalized abdominal pain: Secondary | ICD-10-CM | POA: Insufficient documentation

## 2017-10-18 MED ORDER — IOPAMIDOL (ISOVUE-370) INJECTION 76%
INTRAVENOUS | Status: AC
  Filled 2017-10-18: qty 450

## 2017-10-18 MED ORDER — IOPAMIDOL (ISOVUE-300) INJECTION 61%
INTRAVENOUS | Status: AC
  Administered 2017-10-18: 900 mL via RECTAL
  Filled 2017-10-18: qty 450

## 2017-10-18 MED ORDER — IOPAMIDOL (ISOVUE-300) INJECTION 61%
INTRAVENOUS | Status: AC
  Filled 2017-10-18: qty 300

## 2017-10-18 MED ORDER — IOPAMIDOL (ISOVUE-300) INJECTION 61%
INTRAVENOUS | Status: AC
  Filled 2017-10-18: qty 450
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
## 2017-11-01 ENCOUNTER — Telehealth (INDEPENDENT_AMBULATORY_CARE_PROVIDER_SITE_OTHER): Payer: Self-pay | Admitting: *Deleted

## 2017-11-01 ENCOUNTER — Ambulatory Visit (INDEPENDENT_AMBULATORY_CARE_PROVIDER_SITE_OTHER): Payer: BLUE CROSS/BLUE SHIELD | Admitting: Psychiatry

## 2017-11-01 ENCOUNTER — Encounter (HOSPITAL_COMMUNITY): Payer: Self-pay | Admitting: Psychiatry

## 2017-11-01 VITALS — BP 121/78 | HR 86 | Ht 66.0 in | Wt 277.0 lb

## 2017-11-01 DIAGNOSIS — F419 Anxiety disorder, unspecified: Secondary | ICD-10-CM | POA: Diagnosis not present

## 2017-11-01 DIAGNOSIS — F331 Major depressive disorder, recurrent, moderate: Secondary | ICD-10-CM | POA: Diagnosis not present

## 2017-11-01 DIAGNOSIS — G4700 Insomnia, unspecified: Secondary | ICD-10-CM

## 2017-11-01 DIAGNOSIS — R450 Nervousness: Secondary | ICD-10-CM

## 2017-11-01 MED ORDER — DULOXETINE HCL 60 MG PO CPEP: 60.0000 mg | ORAL_CAPSULE | Freq: Two times a day (BID) | ORAL | 0 refills | Status: DC

## 2017-11-01 MED ORDER — TRAZODONE HCL 100 MG PO TABS: 100.0000 mg | ORAL_TABLET | Freq: Every evening | ORAL | 0 refills | Status: DC | PRN

## 2017-11-01 NOTE — Telephone Encounter (Signed)
Patient stopped by office -- wants to proceed with referral to [facility] -- who do I need to send records too, also do you need to talk to provider 1st

## 2017-11-01 NOTE — Patient Instructions (Signed)
1. Continue duloxetine 60 mg twice a day 2. Continue trazodone 100-150  mg at night as needed for sleep 3. Try to use less of Xanax 4. Return to clinic in three months for 15 mins

## 2017-11-04 NOTE — Telephone Encounter (Signed)
Does she want to see a surgeon or wants to see GI for second opinion. Glasses he wants second opinion and we can proceed.

## 2017-11-08 NOTE — Telephone Encounter (Signed)
She didn't mention either one (surgeon or GI) -- she just stated she wanted to proceed with referral you talked to her about

## 2017-11-08 NOTE — Telephone Encounter (Signed)
Patient called and message left on her answering service. She will let us know if she wants surgical consultation or see a GI physician.

## 2017-11-13 ENCOUNTER — Ambulatory Visit (HOSPITAL_COMMUNITY): Payer: BLUE CROSS/BLUE SHIELD | Admitting: Licensed Clinical Social Worker

## 2017-11-22 ENCOUNTER — Encounter: Payer: Self-pay | Admitting: Family Medicine

## 2017-11-22 ENCOUNTER — Encounter: Payer: Self-pay | Admitting: "Endocrinology

## 2018-01-01 ENCOUNTER — Telehealth (INDEPENDENT_AMBULATORY_CARE_PROVIDER_SITE_OTHER): Payer: Self-pay | Admitting: *Deleted

## 2018-01-01 NOTE — Telephone Encounter (Signed)
Patient called left message requesting her acid reflux medication to be called in. Patient stated she has an attack on Tuesday night with nausea and stomach pain and she is having diarrhea. Patient also asked on message if there has been a referral made for her to see another GI. [phone]

## 2018-01-03 ENCOUNTER — Other Ambulatory Visit (INDEPENDENT_AMBULATORY_CARE_PROVIDER_SITE_OTHER): Payer: Self-pay | Admitting: *Deleted

## 2018-01-03 DIAGNOSIS — K219 Gastro-esophageal reflux disease without esophagitis: Secondary | ICD-10-CM

## 2018-01-03 MED ORDER — OMEPRAZOLE MAGNESIUM 20 MG PO TBEC: 20.0000 mg | DELAYED_RELEASE_TABLET | Freq: Every day | ORAL | 5 refills | Status: AC

## 2018-01-03 NOTE — Telephone Encounter (Signed)
PPI was sent to the patient's pharmacy. Dr.Rehman ask if her referral has been sent to [facility]?

## 2018-01-03 NOTE — Telephone Encounter (Signed)
Referral and notes faxed to [facility], they will contact patient with appt

## 2018-01-29 ENCOUNTER — Ambulatory Visit (INDEPENDENT_AMBULATORY_CARE_PROVIDER_SITE_OTHER): Payer: BLUE CROSS/BLUE SHIELD | Admitting: Otolaryngology

## 2018-01-29 DIAGNOSIS — D440 Neoplasm of uncertain behavior of thyroid gland: Secondary | ICD-10-CM | POA: Diagnosis not present
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
## 2018-01-30 ENCOUNTER — Encounter (HOSPITAL_COMMUNITY): Payer: Self-pay | Admitting: Psychiatry

## 2018-01-30 ENCOUNTER — Ambulatory Visit (INDEPENDENT_AMBULATORY_CARE_PROVIDER_SITE_OTHER): Payer: BLUE CROSS/BLUE SHIELD | Admitting: Psychiatry

## 2018-01-30 ENCOUNTER — Other Ambulatory Visit (INDEPENDENT_AMBULATORY_CARE_PROVIDER_SITE_OTHER): Payer: Self-pay | Admitting: Otolaryngology

## 2018-01-30 VITALS — BP 116/80 | HR 89 | Ht 66.0 in | Wt 278.0 lb

## 2018-01-30 DIAGNOSIS — R450 Nervousness: Secondary | ICD-10-CM

## 2018-01-30 DIAGNOSIS — G4700 Insomnia, unspecified: Secondary | ICD-10-CM | POA: Diagnosis not present

## 2018-01-30 DIAGNOSIS — F331 Major depressive disorder, recurrent, moderate: Secondary | ICD-10-CM | POA: Diagnosis not present

## 2018-01-30 DIAGNOSIS — F419 Anxiety disorder, unspecified: Secondary | ICD-10-CM

## 2018-01-30 DIAGNOSIS — E041 Nontoxic single thyroid nodule: Secondary | ICD-10-CM

## 2018-01-30 MED ORDER — DULOXETINE HCL 60 MG PO CPEP: 60.0000 mg | ORAL_CAPSULE | Freq: Two times a day (BID) | ORAL | 0 refills | Status: DC

## 2018-01-30 MED ORDER — TRAZODONE HCL 100 MG PO TABS: 100.0000 mg | ORAL_TABLET | Freq: Every evening | ORAL | 0 refills | Status: DC | PRN

## 2018-01-30 NOTE — Patient Instructions (Signed)
1. Continue duloxetine 60 mg twice a day 2. Continue trazodone 100-150 mg at night as needed for sleep 3. Return to clinic in three months for 15 mins

## 2018-02-13 ENCOUNTER — Other Ambulatory Visit (INDEPENDENT_AMBULATORY_CARE_PROVIDER_SITE_OTHER): Payer: Self-pay | Admitting: Otolaryngology

## 2018-02-13 ENCOUNTER — Ambulatory Visit
Admission: RE | Admit: 2018-02-13 | Discharge: 2018-02-13 | Disposition: A | Discharge status: Home / Self Care | Payer: Self-pay | Source: Ambulatory Visit | Attending: Otolaryngology | Admitting: Otolaryngology

## 2018-02-13 DIAGNOSIS — R229 Localized swelling, mass and lump, unspecified: Principal | ICD-10-CM

## 2018-02-13 DIAGNOSIS — IMO0002 Reserved for concepts with insufficient information to code with codable children: Secondary | ICD-10-CM

## 2018-02-14 ENCOUNTER — Encounter (HOSPITAL_COMMUNITY): Payer: Self-pay

## 2018-02-14 ENCOUNTER — Ambulatory Visit (HOSPITAL_COMMUNITY)
Admission: RE | Admit: 2018-02-14 | Discharge: 2018-02-14 | Disposition: A | Discharge status: Home / Self Care | Payer: BLUE CROSS/BLUE SHIELD | Source: Ambulatory Visit | Attending: Otolaryngology | Admitting: Otolaryngology

## 2018-02-14 ENCOUNTER — Other Ambulatory Visit (INDEPENDENT_AMBULATORY_CARE_PROVIDER_SITE_OTHER): Payer: Self-pay | Admitting: Otolaryngology

## 2018-02-14 DIAGNOSIS — E041 Nontoxic single thyroid nodule: Secondary | ICD-10-CM

## 2018-02-14 MED ORDER — LIDOCAINE HCL (PF) 1 % IJ SOLN
INTRAMUSCULAR | Status: AC
  Filled 2018-02-14: qty 10

## 2018-02-15 ENCOUNTER — Ambulatory Visit (HOSPITAL_COMMUNITY): Payer: BLUE CROSS/BLUE SHIELD

## 2018-02-20 ENCOUNTER — Ambulatory Visit (HOSPITAL_COMMUNITY): Payer: Self-pay | Admitting: Psychiatry

## 2018-02-21 ENCOUNTER — Ambulatory Visit (INDEPENDENT_AMBULATORY_CARE_PROVIDER_SITE_OTHER): Payer: BLUE CROSS/BLUE SHIELD | Admitting: Psychiatry

## 2018-02-21 ENCOUNTER — Encounter (HOSPITAL_COMMUNITY): Payer: Self-pay | Admitting: Psychiatry

## 2018-02-21 VITALS — BP 111/74 | HR 91 | Ht 66.0 in | Wt 276.0 lb

## 2018-02-21 DIAGNOSIS — F419 Anxiety disorder, unspecified: Secondary | ICD-10-CM | POA: Diagnosis not present

## 2018-02-21 DIAGNOSIS — G4700 Insomnia, unspecified: Secondary | ICD-10-CM

## 2018-02-21 DIAGNOSIS — F331 Major depressive disorder, recurrent, moderate: Secondary | ICD-10-CM | POA: Diagnosis not present

## 2018-02-21 NOTE — Progress Notes (Signed)
BH MD/PA/NP OP Progress Note  02/21/2018 2:48 PM Kristin Bailey  MRN:  [id number]  Chief Complaint:  Chief Complaint    Depression; Anxiety; Follow-up     HPI: This patient is a 57 year old married white female who lives with her husband.  She has 2 grown stepdaughters and one grown daughter.  She works for [employer], working with disabled adults.  The patient regularly sees Dr. Modesta Messing who is out of the office this week.  She has to come in due to increasing symptoms of depression and anxiety.  The patient was last seen by Dr. Modesta Messing on 01/30/2018.  At that time she was actually doing fairly well.  She was supposed to have a thyroid nodule biopsy but this did not happen as they could not find the nodule.  She states that she went to visit a friend whose mother was in hospice care over the weekend.  She mentions that she has lost her parents and her in-laws over the last few years and 1 of them had been in the hospice care center that she visited.  The following day which was Sunday she had several severe panic attacks.  She ended up having to take about a milligram total of Xanax throughout the day which did help somewhat.  Over the last several days she has "felt exhausted" and it has been hard for her to go to work.  She feels somewhat more depressed.  She denies suicidal ideation and states she is sleeping well at night but her energy "feels depleted.  Of note her last TSH was elevated with no follow-up from any physician.  She states that she is not going to follow-up with her primary care and is going to find another one.  Her blood sugars are often over 200 when she checks them.  The patient does not take Xanax on a regular basis but I suggested perhaps she should do so for the next several days at least take the 0.25 mg twice a day.  I have explained that the Cymbalta is about as high as it can go and we can't increase it any further.  We will check her thyroid panel today to  make sure this is still not abnormal with need for follow-up with PCP or endocrinology.  I have explained that abnormal thyroid function can affect mood and anxiety.  She is sleeping well with the trazodone.  She now agrees to follow-up with therapy and also to see Dr. Modesta Messing when she returns. Visit Diagnosis:    ICD-10-CM   1. MDD (major depressive disorder), recurrent episode, moderate ([place]) F33.1 Thyroid Panel With TSH    Past Psychiatric History: None other than trials of fluoxetine Effexor Cymbalta and Xanax by her outpatient primary care physician  Past Medical History:  Past Medical History:  Diagnosis Date  . Depression   . Diabetes mellitus without complication ([place])   . Diverticulitis   . GERD (gastroesophageal reflux disease)   . Hypertension   . IBS (irritable bowel syndrome)   . Pneumonia   . Thyroid disease     Past Surgical History:  Procedure Laterality Date  . AGILE CAPSULE N/A 11/23/2016   Procedure: AGILE CAPSULE;  Surgeon: Rogene Houston, MD;  [facility];  Service: Endoscopy;  Laterality: N/A;  8:30  . CARPAL TUNNEL RELEASE    . CESAREAN SECTION    . CHOLECYSTECTOMY    . COLONOSCOPY N/A 08/28/2014   Procedure: COLONOSCOPY;  Surgeon: Mechele Dawley  Laural Golden, MD;  [facility];  Service: Endoscopy;  Laterality: N/A;  1030  . CYST REMOVAL HAND    . ESOPHAGOGASTRODUODENOSCOPY N/A 03/19/2016   Procedure: ESOPHAGOGASTRODUODENOSCOPY (EGD);  Surgeon: Rogene Houston, MD;  [facility];  Service: Endoscopy;  Laterality: N/A;  . FLEXIBLE SIGMOIDOSCOPY N/A 09/28/2017   Procedure: FLEXIBLE SIGMOIDOSCOPY;  Surgeon: Rogene Houston, MD;  [facility];  Service: Endoscopy;  Laterality: N/A;  . FRACTURE SURGERY     rt ankle   . GIVENS CAPSULE STUDY N/A 12/15/2016   Procedure: GIVENS CAPSULE STUDY;  Surgeon: Rogene Houston, MD;  [facility];  Service: Endoscopy;  Laterality: N/A;  . KNEE SURGERY     meniscus repair  . TUBAL  LIGATION      Family Psychiatric History: None  Family History:  Family History  Problem Relation Age of Onset  . Cancer Mother        colon cancer age74  . Hypertension Mother   . Stroke Mother   . Diabetes Mother   . Non-Hodgkin's lymphoma Father   . Hypertension Father   . Diabetes Father   . Stroke Maternal Grandmother   . Kidney disease Paternal Aunt   . Cancer Paternal Aunt   . Kidney disease Paternal Uncle   . Cancer Paternal Uncle     Social History:  Social History   Socioeconomic History  . Marital status: Married    Spouse name: Not on file  . Number of children: Not on file  . Years of education: Not on file  . Highest education level: Not on file  Occupational History  . Not on file  Social Needs  . Financial resource strain: Not on file  . Food insecurity:    Worry: Not on file    Inability: Not on file  . Transportation needs:    Medical: Not on file    Non-medical: Not on file  Tobacco Use  . Smoking status: Never Smoker  . Smokeless tobacco: Never Used  Substance and Sexual Activity  . Alcohol use: No  . Drug use: No  . Sexual activity: Yes    Birth control/protection: Surgical  Lifestyle  . Physical activity:    Days per week: Not on file    Minutes per session: Not on file  . Stress: Not on file  Relationships  . Social connections:    Talks on phone: Not on file    Gets together: Not on file    Attends religious service: Not on file    Active member of club or organization: Not on file    Attends meetings of clubs or organizations: Not on file    Relationship status: Not on file  Other Topics Concern  . Not on file  Social History Narrative   Works with special needs adults.    Allergies: No Known Allergies  Metabolic Disorder Labs: Lab Results  Component Value Date   HGBA1C 6.5 (H) 03/16/2016   MPG 140 03/16/2016   MPG 140 11/28/2015   No results found for: PROLACTIN No results found for: CHOL, TRIG, HDL, CHOLHDL,  VLDL, LDLCALC Lab Results  Component Value Date   TSH 5.142 (H) 09/27/2017   TSH 2.409 11/28/2015    Therapeutic Level Labs: No results found for: LITHIUM No results found for: VALPROATE No components found for:  CBMZ  Current Medications: Current Outpatient Medications  Medication Sig Dispense Refill  . acetaminophen (TYLENOL 8 HOUR ARTHRITIS PAIN) 650  MG CR tablet Take 1,300 mg by mouth every morning. *May take additional as needed    . albuterol (PROVENTIL HFA;VENTOLIN HFA) 108 (90 BASE) MCG/ACT inhaler Inhale 2 puffs into the lungs every 4 (four) hours as needed for wheezing or shortness of breath.    . ALPRAZolam (XANAX) 0.25 MG tablet Take 0.25 mg by mouth 2 (two) times daily as needed for anxiety.    Marland Kitchen amoxicillin-clavulanate (AUGMENTIN) 875-125 MG tablet Take 1 tablet 2 (two) times daily by mouth. 8 tablet 0  . Ascorbic Acid (VITAMIN C) 1000 MG tablet Take 1,000 mg 2 (two) times daily by mouth.    Marland Kitchen aspirin EC 81 MG tablet Take 81 mg by mouth daily.    . Dulaglutide (TRULICITY) 2.22 LN/9.8XQ SOPN Inject 0.5 mLs into the skin every Monday. On  Monday's.     . DULoxetine (CYMBALTA) 60 MG capsule Take 1 capsule (60 mg total) by mouth 2 (two) times daily. 180 capsule 0  . glipiZIDE (GLUCOTROL) 5 MG tablet Take 5 mg by mouth 2 (two) times daily.     Marland Kitchen HYDROcodone-acetaminophen (NORCO/VICODIN) 5-325 MG tablet Take 1 tablet by mouth every 4 (four) hours as needed. 10 tablet 0  . levothyroxine (SYNTHROID, LEVOTHROID) 100 MCG tablet Take 100 mcg daily by mouth.   1  . lisinopril-hydrochlorothiazide (PRINZIDE,ZESTORETIC) 20-12.5 MG tablet Take 1 tablet daily by mouth. Resume in 3 days 30 tablet 11  . metoCLOPramide (REGLAN) 10 MG tablet Take 1 tablet (10 mg total) by mouth every 6 (six) hours as needed for nausea (or headache). 30 tablet 0  . montelukast (SINGULAIR) 10 MG tablet Take 10 mg by mouth at bedtime.    . Multiple Vitamin (MULTIVITAMIN WITH MINERALS) TABS tablet Take 1 tablet by  mouth daily.    Marland Kitchen omeprazole (PRILOSEC OTC) 20 MG tablet Take 1 tablet (20 mg total) by mouth daily. 20 tablet 5  . ondansetron (ZOFRAN) 4 MG tablet Take 1 tablet (4 mg total) by mouth every 8 (eight) hours as needed for nausea or vomiting. 30 tablet 1  . OVER THE COUNTER MEDICATION Patient is taking CBD Oil - 1-2 drops under tongue, patient will take when her tummy hurts. PRN    . oxycodone (OXY-IR) 5 MG capsule Take 1 capsule (5 mg total) by mouth every 4 (four) hours as needed. 12 capsule 0  . traZODone (DESYREL) 100 MG tablet Take 1 tablet (100 mg total) by mouth at bedtime as needed for sleep. p 90 tablet 0  . valACYclovir (VALTREX) 1000 MG tablet Take 2 g 2 (two) times daily by mouth.  2   No current facility-administered medications for this visit.      Musculoskeletal: Strength & Muscle Tone: within normal limits Gait & Station: normal Patient leans: N/A  Psychiatric Specialty Exam: ROS  Blood pressure 111/74, pulse 91, height 5\' 6"  (1.676 m), weight 276 lb (125.2 kg), last menstrual period 03/27/2014, SpO2 97 %.Body mass index is 44.55 kg/m.  General Appearance: Casual, Neat and Well Groomed  Eye Contact:  Good  Speech:  Clear and Coherent  Volume:  Normal  Mood:  Depressed and Dysphoric  Affect:  Appropriate and Tearful  Thought Process: Goal-directed  Orientation:  Full (Time, Place, and Person)  Thought Content: Rumination   Suicidal Thoughts:  No  Homicidal Thoughts:  No  Memory:  Immediate;   Good Recent;   Good Remote;   Good  Judgement:  Good  Insight:  Fair  Psychomotor Activity:  Decreased  Concentration:  Concentration: Fair and Attention Span: Fair  Recall:  Good  Fund of Knowledge: Good  Language: Good  Akathisia:  No  Handed:  Right  AIMS (if indicated): not done  Assets:  Communication Skills Desire for Improvement Resilience Social Support Talents/Skills  ADL's:  Intact  Cognition: WNL  Sleep:  Good   Screenings:   Assessment and Plan:  Patient is a 57 year old female with a history of depression and anxiety as well as insomnia.  She does have a history of thyroid nodules and her last TSH was elevated.  We will check a thyroid panel today and I will let her know the results.  If abnormal she will need to follow-up with PCP.  For now she will continue Cymbalta 60 mg twice daily and trazodone 100-150 mg at bedtime for sleep.  I have instructed her to take Xanax 0.25 mg twice a day for the next several days to see if we can get the anxiety symptoms diminished.  She is going to return to see Dr. Modesta Messing when she is back in the office or call sooner if needed.   Levonne Spiller, MD 02/21/2018, 2:48 PM

## 2018-02-22 ENCOUNTER — Emergency Department (HOSPITAL_COMMUNITY): Payer: BLUE CROSS/BLUE SHIELD

## 2018-02-22 ENCOUNTER — Ambulatory Visit (HOSPITAL_COMMUNITY): Payer: Self-pay | Admitting: Licensed Clinical Social Worker

## 2018-02-22 ENCOUNTER — Other Ambulatory Visit: Payer: Self-pay

## 2018-02-22 ENCOUNTER — Encounter (HOSPITAL_COMMUNITY): Payer: Self-pay | Admitting: *Deleted

## 2018-02-22 ENCOUNTER — Emergency Department (HOSPITAL_COMMUNITY)
Admission: EM | Admit: 2018-02-22 | Discharge: 2018-02-22 | Disposition: A | Discharge status: Home / Self Care | Payer: BLUE CROSS/BLUE SHIELD | Attending: Emergency Medicine | Admitting: Emergency Medicine

## 2018-02-22 DIAGNOSIS — E119 Type 2 diabetes mellitus without complications: Secondary | ICD-10-CM | POA: Insufficient documentation

## 2018-02-22 DIAGNOSIS — R42 Dizziness and giddiness: Secondary | ICD-10-CM | POA: Insufficient documentation

## 2018-02-22 DIAGNOSIS — Z79899 Other long term (current) drug therapy: Secondary | ICD-10-CM | POA: Diagnosis not present

## 2018-02-22 DIAGNOSIS — R110 Nausea: Secondary | ICD-10-CM | POA: Insufficient documentation

## 2018-02-22 DIAGNOSIS — R531 Weakness: Secondary | ICD-10-CM | POA: Diagnosis not present

## 2018-02-22 DIAGNOSIS — R1030 Lower abdominal pain, unspecified: Secondary | ICD-10-CM | POA: Insufficient documentation

## 2018-02-22 DIAGNOSIS — R6883 Chills (without fever): Secondary | ICD-10-CM | POA: Diagnosis not present

## 2018-02-22 DIAGNOSIS — Z7984 Long term (current) use of oral hypoglycemic drugs: Secondary | ICD-10-CM | POA: Diagnosis not present

## 2018-02-22 DIAGNOSIS — Z7982 Long term (current) use of aspirin: Secondary | ICD-10-CM | POA: Insufficient documentation

## 2018-02-22 DIAGNOSIS — I10 Essential (primary) hypertension: Secondary | ICD-10-CM | POA: Insufficient documentation

## 2018-02-22 LAB — CBC WITH DIFFERENTIAL/PLATELET
Basophils Absolute: 0 10*3/uL (ref 0.0–0.1)
Basophils Relative: 0 %
EOS PCT: 1 %
Eosinophils Absolute: 0.2 10*3/uL (ref 0.0–0.7)
HCT: 44.2 % (ref 36.0–46.0)
HEMOGLOBIN: 14.2 g/dL (ref 12.0–15.0)
LYMPHS ABS: 4.1 10*3/uL — AB (ref 0.7–4.0)
LYMPHS PCT: 27 %
MCH: 27.5 pg (ref 26.0–34.0)
MCHC: 32.1 g/dL (ref 30.0–36.0)
MCV: 85.5 fL (ref 78.0–100.0)
Monocytes Absolute: 1.2 10*3/uL — ABNORMAL HIGH (ref 0.1–1.0)
Monocytes Relative: 8 %
NEUTROS ABS: 9.7 10*3/uL — AB (ref 1.7–7.7)
NEUTROS PCT: 64 %
PLATELETS: 361 10*3/uL (ref 150–400)
RBC: 5.17 MIL/uL — AB (ref 3.87–5.11)
RDW: 13.9 % (ref 11.5–15.5)
WBC: 15.1 10*3/uL — AB (ref 4.0–10.5)

## 2018-02-22 LAB — COMPREHENSIVE METABOLIC PANEL
ALK PHOS: 81 U/L (ref 38–126)
ALT: 30 U/L (ref 14–54)
AST: 22 U/L (ref 15–41)
Albumin: 4.3 g/dL (ref 3.5–5.0)
Anion gap: 12 (ref 5–15)
BUN: 13 mg/dL (ref 6–20)
CALCIUM: 10 mg/dL (ref 8.9–10.3)
CHLORIDE: 100 mmol/L — AB (ref 101–111)
CO2: 27 mmol/L (ref 22–32)
CREATININE: 0.56 mg/dL (ref 0.44–1.00)
Glucose, Bld: 87 mg/dL (ref 65–99)
Potassium: 3.7 mmol/L (ref 3.5–5.1)
Sodium: 139 mmol/L (ref 135–145)
Total Bilirubin: 0.3 mg/dL (ref 0.3–1.2)
Total Protein: 8.3 g/dL — ABNORMAL HIGH (ref 6.5–8.1)

## 2018-02-22 LAB — URINALYSIS, ROUTINE W REFLEX MICROSCOPIC
Bilirubin Urine: NEGATIVE
GLUCOSE, UA: NEGATIVE mg/dL
HGB URINE DIPSTICK: NEGATIVE
Ketones, ur: NEGATIVE mg/dL
LEUKOCYTES UA: NEGATIVE
Nitrite: NEGATIVE
PROTEIN: NEGATIVE mg/dL
Specific Gravity, Urine: 1.019 (ref 1.005–1.030)
pH: 5 (ref 5.0–8.0)

## 2018-02-22 LAB — THYROID PANEL WITH TSH
FREE THYROXINE INDEX: 2.3 (ref 1.4–3.8)
T3 Uptake: 30 % (ref 22–35)
T4 TOTAL: 7.7 ug/dL (ref 5.1–11.9)
TSH: 1.49 mIU/L (ref 0.40–4.50)

## 2018-02-22 LAB — CBG MONITORING, ED: GLUCOSE-CAPILLARY: 94 mg/dL (ref 65–99)

## 2018-02-22 LAB — MAGNESIUM: Magnesium: 1.7 mg/dL (ref 1.7–2.4)

## 2018-02-22 MED ORDER — LACTATED RINGERS IV BOLUS: 1000.0000 mL | Freq: Once | INTRAVENOUS | Status: DC

## 2018-02-22 MED ORDER — LACTATED RINGERS IV BOLUS
1000.0000 mL | Freq: Once | INTRAVENOUS | Status: AC
  Administered 2018-02-22: 1000 mL via INTRAVENOUS

## 2018-02-22 MED ORDER — ONDANSETRON 4 MG PO TBDP: 4.0000 mg | ORAL_TABLET | Freq: Three times a day (TID) | ORAL | 0 refills | Status: DC | PRN

## 2018-02-22 MED ORDER — IOPAMIDOL (ISOVUE-300) INJECTION 61%
100.0000 mL | Freq: Once | INTRAVENOUS | Status: AC | PRN
  Administered 2018-02-22: 100 mL via INTRAVENOUS

## 2018-02-22 MED ORDER — ONDANSETRON HCL 4 MG/2ML IJ SOLN
8.0000 mg | Freq: Once | INTRAMUSCULAR | Status: AC
  Administered 2018-02-22: 8 mg via INTRAVENOUS
  Filled 2018-02-22: qty 4

## 2018-02-22 NOTE — ED Provider Notes (Signed)
[facility] EMERGENCY DEPARTMENT Provider Note   CSN: [id number] Arrival date & time: 02/22/18  1011     History   Chief Complaint Chief Complaint  Patient presents with  . Weakness    HPI Kristin Bailey is a 57 y.o. female.  History of depression and diverticulitis and colitis the presents to the emergency department today for weakness.  Patient states that for the last 3 or 4 days of progressively worsening weakness with some intermittent abdominal pain that seems to be lower and left-sided in nature.  Also with persistent vomiting and intermittent episodes where she will have chills and severe nausea.  No known fevers.  Saw her psychiatrist yesterday who ordered a thyroid panel which resulted as normal.  The history is provided by the patient and the spouse.  Weakness  Primary symptoms include dizziness.  Primary symptoms include no focal weakness, no loss of sensation, no speech change, no visual change, no auditory change. This is a new problem. The current episode started more than 2 days ago. The problem has been gradually worsening. There was no focality noted. There has been no fever. Pertinent negatives include no shortness of breath, no vomiting, no confusion and no headaches.    Past Medical History:  Diagnosis Date  . Depression   . Diabetes mellitus without complication ([place])   . Diverticulitis   . GERD (gastroesophageal reflux disease)   . Hypertension   . IBS (irritable bowel syndrome)   . Pneumonia   . Thyroid disease     Patient Active Problem List   Diagnosis Date Noted  . Abdominal pain, left lower quadrant   . Diverticulitis 09/26/2017  . Hyponatremia 09/26/2017  . Noninfectious gastroenteritis 11/28/2016  . Enteritis 11/04/2016  . Lactic acidosis 11/04/2016  . Enterocolitis 03/17/2016  . Gastroenteritis, infectious 03/17/2016  . Leukocytosis 11/28/2015  . Acute respiratory failure with hypoxia ([place]) 11/28/2015  . Controlled type 2 diabetes  mellitus without complication, without long-term current use of insulin ([place]) 11/28/2015  . UTI (lower urinary tract infection) 11/27/2015  . Colitis 11/27/2015  . AKI (acute kidney injury) ([place]) 11/27/2015    Past Surgical History:  Procedure Laterality Date  . AGILE CAPSULE N/A 11/23/2016   Procedure: AGILE CAPSULE;  Surgeon: Rogene Houston, MD;  [facility];  Service: Endoscopy;  Laterality: N/A;  8:30  . CARPAL TUNNEL RELEASE    . CESAREAN SECTION    . CHOLECYSTECTOMY    . COLONOSCOPY N/A 08/28/2014   Procedure: COLONOSCOPY;  Surgeon: Rogene Houston, MD;  [facility];  Service: Endoscopy;  Laterality: N/A;  1030  . CYST REMOVAL HAND    . ESOPHAGOGASTRODUODENOSCOPY N/A 03/19/2016   Procedure: ESOPHAGOGASTRODUODENOSCOPY (EGD);  Surgeon: Rogene Houston, MD;  [facility];  Service: Endoscopy;  Laterality: N/A;  . FLEXIBLE SIGMOIDOSCOPY N/A 09/28/2017   Procedure: FLEXIBLE SIGMOIDOSCOPY;  Surgeon: Rogene Houston, MD;  [facility];  Service: Endoscopy;  Laterality: N/A;  . FRACTURE SURGERY     rt ankle   . GIVENS CAPSULE STUDY N/A 12/15/2016   Procedure: GIVENS CAPSULE STUDY;  Surgeon: Rogene Houston, MD;  [facility];  Service: Endoscopy;  Laterality: N/A;  . KNEE SURGERY     meniscus repair  . TUBAL LIGATION       OB History    Gravida  1   Para  1   Term  1   Preterm      AB  Living  1     SAB      TAB      Ectopic      Multiple      Live Births               Home Medications    Prior to Admission medications   Medication Sig Start Date End Date Taking? Authorizing Provider  acetaminophen (TYLENOL 8 HOUR ARTHRITIS PAIN) 650 MG CR tablet Take 1,300 mg by mouth every morning. *May take additional as needed   Yes [provider]  albuterol (PROVENTIL HFA;VENTOLIN HFA) 108 (90 BASE) MCG/ACT inhaler Inhale 2 puffs into the lungs every 4 (four) hours as needed for wheezing or shortness  of breath.   Yes [provider]  ALPRAZolam (XANAX) 0.25 MG tablet Take 0.25 mg by mouth 2 (two) times daily as needed for anxiety.   Yes [provider]  Ascorbic Acid (VITAMIN C) 1000 MG tablet Take 1,000 mg 2 (two) times daily by mouth.   Yes [provider]  aspirin EC 81 MG tablet Take 81 mg by mouth daily.   Yes [provider]  Dulaglutide (TRULICITY) 7.10 GY/6.9SW SOPN Inject 0.5 mLs into the skin every Monday. On  Monday's.    Yes [provider]  DULoxetine (CYMBALTA) 60 MG capsule Take 1 capsule (60 mg total) by mouth 2 (two) times daily. 01/30/18  Yes Hisada, Elie Goody, MD  glipiZIDE (GLUCOTROL) 5 MG tablet Take 5 mg by mouth 2 (two) times daily.    Yes [provider]  HYDROcodone-acetaminophen (NORCO/VICODIN) 5-325 MG tablet Take 1 tablet by mouth every 4 (four) hours as needed. 09/07/17  Yes Tanna Furry, MD  levothyroxine (SYNTHROID, LEVOTHROID) 100 MCG tablet Take 100 mcg daily by mouth.  08/16/17  Yes [provider]  lisinopril-hydrochlorothiazide (PRINZIDE,ZESTORETIC) 20-12.5 MG tablet Take 1 tablet daily by mouth. Resume in 3 days 09/29/17 09/23/18 Yes Kathie Dike, MD  metoCLOPramide (REGLAN) 10 MG tablet Take 1 tablet (10 mg total) by mouth every 6 (six) hours as needed for nausea (or headache). 54/62/70  Yes Delora Fuel, MD  montelukast (SINGULAIR) 10 MG tablet Take 10 mg by mouth at bedtime.   Yes [provider]  Multiple Vitamin (MULTIVITAMIN WITH MINERALS) TABS tablet Take 1 tablet by mouth daily.   Yes [provider]  omeprazole (PRILOSEC OTC) 20 MG tablet Take 1 tablet (20 mg total) by mouth daily. Patient taking differently: Take 20 mg by mouth every other day.  01/03/18  Yes Rehman, Mechele Dawley, MD  ondansetron (ZOFRAN) 4 MG tablet Take 1 tablet (4 mg total) by mouth every 8 (eight) hours as needed for nausea or vomiting. 11/16/16  Yes Setzer, Terri L, NP  oxycodone (OXY-IR) 5 MG capsule Take 1  capsule (5 mg total) by mouth every 4 (four) hours as needed. 35/00/93  Yes Delora Fuel, MD  traZODone (DESYREL) 100 MG tablet Take 1 tablet (100 mg total) by mouth at bedtime as needed for sleep. p 01/30/18  Yes Hisada, Elie Goody, MD  valACYclovir (VALTREX) 1000 MG tablet Take 2 g 2 (two) times daily by mouth. 09/23/17  Yes [provider]    Family History Family History  Problem Relation Age of Onset  . Cancer Mother        colon cancer age74  . Hypertension Mother   . Stroke Mother   . Diabetes Mother   . Non-Hodgkin's lymphoma Father   . Hypertension Father   . Diabetes Father   .  Stroke Maternal Grandmother   . Kidney disease Paternal Aunt   . Cancer Paternal Aunt   . Kidney disease Paternal Uncle   . Cancer Paternal Uncle     Social History Social History   Tobacco Use  . Smoking status: Never Smoker  . Smokeless tobacco: Never Used  Substance Use Topics  . Alcohol use: No  . Drug use: No     Allergies   Patient has no known allergies.   Review of Systems Review of Systems  Respiratory: Negative for shortness of breath.   Gastrointestinal: Negative for vomiting.  Neurological: Positive for dizziness and weakness. Negative for speech change, focal weakness and headaches.  Psychiatric/Behavioral: Negative for confusion.  All other systems reviewed and are negative.    Physical Exam Updated Vital Signs BP (!) 95/51   Pulse 83   Temp 98.1 F (36.7 C) (Oral)   Resp (!) 27   Ht 5\' 6"  (1.676 m)   Wt 125.2 kg (276 lb)   LMP 03/27/2014 Comment: irregular  SpO2 93%   BMI 44.55 kg/m   Physical Exam  Constitutional: She appears well-developed and well-nourished.  HENT:  Head: Normocephalic and atraumatic.  Eyes: Conjunctivae and EOM are normal.  Neck: Normal range of motion.  Cardiovascular: Normal rate and regular rhythm.  Pulmonary/Chest: No stridor. No respiratory distress.  Abdominal: Soft. She exhibits no distension. There is no tenderness.  There is no guarding.  Neurological: She is alert.  Skin: Skin is warm and dry.  Nursing note and vitals reviewed.    ED Treatments / Results  Labs (all labs ordered are listed, but only abnormal results are displayed) Labs Reviewed  CBC WITH DIFFERENTIAL/PLATELET - Abnormal; Notable for the following components:      Result Value   WBC 15.1 (*)    RBC 5.17 (*)    Neutro Abs 9.7 (*)    Lymphs Abs 4.1 (*)    Monocytes Absolute 1.2 (*)    All other components within normal limits  COMPREHENSIVE METABOLIC PANEL - Abnormal; Notable for the following components:   Chloride 100 (*)    Total Protein 8.3 (*)    All other components within normal limits  MAGNESIUM  URINALYSIS, ROUTINE W REFLEX MICROSCOPIC  METANEPHRINES, PLASMA    EKG EKG Interpretation  Date/Time:  Thursday February 22 2018 10:33:25 EDT Ventricular Rate:  94 PR Interval:    QRS Duration: 87 QT Interval:  377 QTC Calculation: 472 R Axis:   50 Text Interpretation:  Sinus rhythm Borderline repolarization abnormality No significant change since last tracing Confirmed by Merrily Pew [id number]) on 02/22/2018 10:54:17 AM   Radiology Dg Chest 2 View  Result Date: 02/22/2018 CLINICAL DATA:  Weakness and dizziness.  Chest pain EXAM: CHEST - 2 VIEW COMPARISON:  September 26, 2017 FINDINGS: No edema or consolidation. The heart size and pulmonary vascularity are normal. No adenopathy. No pneumothorax. There is degenerative change in the thoracic spine. IMPRESSION: No edema or consolidation. Electronically Signed   By: Lowella Grip III M.D.   On: 02/22/2018 11:59    Procedures Procedures (including critical care time)  Medications Ordered in ED Medications  ondansetron (ZOFRAN) injection 8 mg (8 mg Intravenous Given 02/22/18 1215)  lactated ringers bolus 1,000 mL (0 mLs Intravenous Stopped 02/22/18 1312)     Initial Impression / Assessment and Plan / ED Course  I have reviewed the triage vital signs and the nursing  notes.  Pertinent labs & imaging results that were available during my  care of the patient were reviewed by me and considered in my medical decision making (see chart for details).     Workup overall negative however patient with blood pressures that are slowly decreasing so we will get CT scan to evaluate for diverticulitis, abscess or colitis as cause for her symptoms.  If this is negative and her blood pressures are normal and she is not orthostatic at the she can be discharged.  However she had any issues and obviously that would need to be addressed.  Care of patient assumed by Dr. Laverta Baltimore pending CT scan and likely discharge for further workup as an outpatient.  Final Clinical Impressions(s) / ED Diagnoses   Final diagnoses:  None    ED Discharge Orders    None       Kalieb Freeland, Corene Cornea, MD 02/22/18 [phone]

## 2018-02-22 NOTE — ED Notes (Signed)
C/o weakness and body aches.  Had 3 panic attacks on Sunday and being treated with Cymbalta and Xanax prn.  Seen Dr Harrington Challenger yesterday at [facility] in [place] and labs were drawn.

## 2018-02-22 NOTE — ED Notes (Addendum)
Given spike with ice and peanut butter crackers.

## 2018-02-22 NOTE — ED Notes (Signed)
MD aware of low BP.  Pt says she feels better and wants food, informed that once MD assess all treatment has resulted.

## 2018-02-22 NOTE — ED Provider Notes (Signed)
Blood pressure 124/65, pulse 81, temperature 98.1 F (36.7 C), temperature source Oral, resp. rate 19, height 5\' 6"  (1.676 m), weight 125.2 kg (276 lb), last menstrual period 03/27/2014, SpO2 100 %.  Assuming care from Dr. Dayna Barker.  In short, Kristin Bailey is a 57 y.o. female with a chief complaint of Weakness .  Refer to the original H&P for additional details.  The current plan of care is to follow up CT and reassess.  05:35PM CT imaging reviewed with no acute findings. BP normalized here in the ED. No evidence of sepsis. Advised hydration at home and urgent PCP follow up. Discussed return precautions with patient and family in detail.    EKG Interpretation  Date/Time:  Thursday February 22 2018 10:33:25 EDT Ventricular Rate:  94 PR Interval:    QRS Duration: 87 QT Interval:  377 QTC Calculation: 472 R Axis:   50 Text Interpretation:  Sinus rhythm Borderline repolarization abnormality No significant change since last tracing Confirmed by Merrily Pew [phone]) on 02/22/2018 10:54:17 AM      At this time, I do not feel there is any life-threatening condition present. I have reviewed and discussed all results (EKG, imaging, lab, urine as appropriate), exam findings with patient. I have reviewed nursing notes and appropriate previous records.  I feel the patient is safe to be discharged home without further emergent workup. Discussed usual and customary return precautions. Patient and family (if present) verbalize understanding and are comfortable with this plan.  Patient will follow-up with their primary care provider. If they do not have a primary care provider, information for follow-up has been provided to them. All questions have been answered.  Nanda Quinton, MD    Margette Fast, MD 02/22/18 [phone]

## 2018-02-22 NOTE — Discharge Instructions (Signed)
As we discussed, we believe her symptoms are caused today by mild volume depletion, or mild dehydration, without any evidence of damage to your body.  Please drink plenty of clear fluids such as water and/or Gatorade and follow up with your regular doctor or the doctors listed in his documentation at the next available opportunity.  Return to the emergency department with any new or worsening symptoms that concern you, including but not limited to fever, shortness of breath, chest pain, or other concerning symptoms. ° ° °Dehydration, Adult °Dehydration is when you lose more fluids from the body than you take in. Vital organs like the kidneys, brain, and heart cannot function without a proper amount of fluids and salt. Any loss of fluids from the body can cause dehydration.  °CAUSES  °Vomiting. °Diarrhea. °Excessive sweating. °Excessive urine output. °Fever. °SYMPTOMS  °Mild dehydration °Thirst. °Dry lips. °Slightly dry mouth. °Moderate dehydration °Very dry mouth. °Sunken eyes. °Skin does not bounce back quickly when lightly pinched and released. °Dark urine and decreased urine production. °Decreased tear production. °Headache. °Severe dehydration °Very dry mouth. °Extreme thirst. °Rapid, weak pulse (more than 100 beats per minute at rest). °Cold hands and feet. °Not able to sweat in spite of heat and temperature. °Rapid breathing. °Blue lips. °Confusion and lethargy. °Difficulty being awakened. °Minimal urine production. °No tears. °DIAGNOSIS  °Your caregiver will diagnose dehydration based on your symptoms and your exam. Blood and urine tests will help confirm the diagnosis. The diagnostic evaluation should also identify the cause of dehydration. °TREATMENT  °Treatment of mild or moderate dehydration can often be done at home by increasing the amount of fluids that you drink. It is best to drink small amounts of fluid more often. Drinking too much at one time can make vomiting worse. Refer to the home care  instructions below. °Severe dehydration needs to be treated at the hospital where you will probably be given intravenous (IV) fluids that contain water and electrolytes. °HOME CARE INSTRUCTIONS  °Ask your caregiver about specific rehydration instructions. °Drink enough fluids to keep your urine clear or pale yellow. °Drink small amounts frequently if you have nausea and vomiting. °Eat as you normally do. °Avoid: °Foods or drinks high in sugar. °Carbonated drinks. °Juice. °Extremely hot or cold fluids. °Drinks with caffeine. °Fatty, greasy foods. °Alcohol. °Tobacco. °Overeating. °Gelatin desserts. °Wash your hands well to avoid spreading bacteria and viruses. °Only take over-the-counter or prescription medicines for pain, discomfort, or fever as directed by your caregiver. °Ask your caregiver if you should continue all prescribed and over-the-counter medicines. °Keep all follow-up appointments with your caregiver. °SEEK MEDICAL CARE IF: °You have abdominal pain and it increases or stays in one area (localizes). °You have a rash, stiff neck, or severe headache. °You are irritable, sleepy, or difficult to awaken. °You are weak, dizzy, or extremely thirsty. °SEEK IMMEDIATE MEDICAL CARE IF:  °You are unable to keep fluids down or you get worse despite treatment. °You have frequent episodes of vomiting or diarrhea. °You have blood or green matter (bile) in your vomit. °You have blood in your stool or your stool looks black and tarry. °You have not urinated in 6 to 8 hours, or you have only urinated a small amount of very dark urine. °You have a fever. °You faint. °MAKE SURE YOU:  °Understand these instructions. °Will watch your condition. °Will get help right away if you are not doing well or get worse. °Document Released: 10/31/2005 Document Revised: 01/23/2012 Document Reviewed: 06/20/2011 °ExitCare® Patient Information ©2015   [employer]. This information is not intended to replace advice given to you by your health  care provider. Make sure you discuss any questions you have with your health care provider. ° °Rehydration, Adult °Rehydration is the replacement of body fluids lost during dehydration. Dehydration is an extreme loss of body fluids to the point of body function impairment. There are many ways extreme fluid loss can occur, including vomiting, diarrhea, or excess sweating. Recovering from dehydration requires replacing lost fluids, continuing to eat to maintain strength, and avoiding foods and beverages that may contribute to further fluid loss or may increase nausea. °HOW TO REHYDRATE °In most cases, rehydration involves the replacement of not only fluids but also carbohydrates and basic body salts. Rehydration with an oral rehydration solution is one way to replace essential nutrients lost through dehydration. °An oral rehydration solution can be purchased at pharmacies, retail stores, and online. Premixed packets of powder that you combine with water to make a solution are also sold. You can prepare an oral rehydration solution at home by mixing the following ingredients together:  ° - tsp table salt. °¾ tsp baking soda. ° tsp salt substitute containing potassium chloride. °1 tablespoons sugar. °1 L (34 oz) of water. °Be sure to use exact measurements. Including too much sugar can make diarrhea worse. °Drink ½-1 cup (120-240 mL) of oral rehydration solution each time you have diarrhea or vomit. If drinking this amount makes your vomiting worse, try drinking smaller amounts more often. For example, drink 1-3 tsp every 5-10 minutes.  °A general rule for staying hydrated is to drink 1½-2 L of fluid per day. Talk to your caregiver about the specific amount you should be drinking each day. Drink enough fluids to keep your urine clear or pale yellow. °EATING WHEN DEHYDRATED °Even if you have had severe sweating or you are having diarrhea, do not stop eating. Many healthy items in a normal diet are okay to continue eating  while recovering from dehydration. The following tips can help you to lessen nausea when you eat: °Ask someone else to prepare your food. Cooking smells may worsen nausea. °Eat in a well-ventilated room away from cooking smells. °Sit up when you eat. Avoid lying down until 1-2 hours after eating. °Eat small amounts when you eat. °Eat foods that are easy to digest. These include soft, well-cooked, or mashed foods. °FOODS AND BEVERAGES TO AVOID °Avoid eating or drinking the following foods and beverages that may increase nausea or further loss of fluid:  °Fruit juices with a high sugar content, such as concentrated juices. °Alcohol. °Beverages containing caffeine. °Carbonated drinks. They may cause a lot of gas. °Foods that may cause a lot of gas, such as cabbage, broccoli, and beans. °Fatty, greasy, and fried foods. °Spicy, very salty, and very sweet foods or drinks. °Foods or drinks that are very hot or very cold. Consume food or drinks at or near room temperature. °Foods that need a lot of chewing, such as raw vegetables. °Foods that are sticky or hard to swallow, such as peanut butter. °Document Released: 01/23/2012 Document Revised: 07/25/2012 Document Reviewed: 01/23/2012 °ExitCare® Patient Information ©2015 [employer]. This information is not intended to replace advice given to you by your health care provider. Make sure you discuss any questions you have with your health care provider. ° ° ° °

## 2018-02-22 NOTE — ED Triage Notes (Addendum)
Pt c/o nausea, panic attacks, generalized weakness, dizziness, body aches, decreased energy since Sunday. Denies vomiting. Pt was seen by Dr. Harrington Challenger yesterday and had blood work done to check her thyroid levels.

## 2018-02-22 NOTE — ED Notes (Signed)
No c/o nausea

## 2018-02-26 LAB — METANEPHRINES, PLASMA
METANEPHRINE FREE: 11 pg/mL (ref 0–62)
NORMETANEPHRINE FREE: 259 pg/mL — AB (ref 0–145)
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
## 2018-03-05 ENCOUNTER — Ambulatory Visit (INDEPENDENT_AMBULATORY_CARE_PROVIDER_SITE_OTHER): Payer: BLUE CROSS/BLUE SHIELD | Admitting: Psychiatry

## 2018-03-05 ENCOUNTER — Encounter (HOSPITAL_COMMUNITY): Payer: Self-pay | Admitting: Psychiatry

## 2018-03-05 VITALS — BP 145/87 | HR 80 | Ht 66.0 in | Wt 285.0 lb

## 2018-03-05 DIAGNOSIS — F331 Major depressive disorder, recurrent, moderate: Secondary | ICD-10-CM

## 2018-03-05 DIAGNOSIS — F419 Anxiety disorder, unspecified: Secondary | ICD-10-CM

## 2018-03-05 DIAGNOSIS — R450 Nervousness: Secondary | ICD-10-CM | POA: Diagnosis not present

## 2018-03-05 MED ORDER — BUSPIRONE HCL 5 MG PO TABS: ORAL_TABLET | ORAL | 1 refills | Status: DC

## 2018-03-05 NOTE — Patient Instructions (Signed)
1. Continueduloxetine 60 mg twice a day 2. Start buspar 5 mg twice a day for one week, then 5 mg three times a day  3.Continuetrazodone 100-150 mg at night as needed for sleep 4. Return to clinic in three months for 15 mins 5. Continue Xanax 0.25 mg up to twice a day as needed for anxiety

## 2018-03-21 ENCOUNTER — Encounter (HOSPITAL_COMMUNITY): Payer: Self-pay | Admitting: Emergency Medicine

## 2018-03-21 ENCOUNTER — Other Ambulatory Visit: Payer: Self-pay

## 2018-03-21 ENCOUNTER — Emergency Department (HOSPITAL_COMMUNITY)
Admission: EM | Admit: 2018-03-21 | Discharge: 2018-03-22 | Disposition: A | Discharge status: Home / Self Care | Payer: BLUE CROSS/BLUE SHIELD | Attending: Emergency Medicine | Admitting: Emergency Medicine

## 2018-03-21 DIAGNOSIS — E119 Type 2 diabetes mellitus without complications: Secondary | ICD-10-CM | POA: Insufficient documentation

## 2018-03-21 DIAGNOSIS — Z79899 Other long term (current) drug therapy: Secondary | ICD-10-CM | POA: Insufficient documentation

## 2018-03-21 DIAGNOSIS — Z7982 Long term (current) use of aspirin: Secondary | ICD-10-CM | POA: Insufficient documentation

## 2018-03-21 DIAGNOSIS — R1030 Lower abdominal pain, unspecified: Secondary | ICD-10-CM | POA: Diagnosis present

## 2018-03-21 DIAGNOSIS — I10 Essential (primary) hypertension: Secondary | ICD-10-CM | POA: Diagnosis not present

## 2018-03-21 DIAGNOSIS — R112 Nausea with vomiting, unspecified: Secondary | ICD-10-CM | POA: Diagnosis not present

## 2018-03-21 DIAGNOSIS — Z7984 Long term (current) use of oral hypoglycemic drugs: Secondary | ICD-10-CM | POA: Insufficient documentation

## 2018-03-21 DIAGNOSIS — R197 Diarrhea, unspecified: Secondary | ICD-10-CM | POA: Insufficient documentation

## 2018-03-21 LAB — COMPREHENSIVE METABOLIC PANEL
ALBUMIN: 4 g/dL (ref 3.5–5.0)
ALT: 28 U/L (ref 14–54)
AST: 25 U/L (ref 15–41)
Alkaline Phosphatase: 80 U/L (ref 38–126)
Anion gap: 11 (ref 5–15)
BUN: 13 mg/dL (ref 6–20)
CHLORIDE: 100 mmol/L — AB (ref 101–111)
CO2: 28 mmol/L (ref 22–32)
Calcium: 9.6 mg/dL (ref 8.9–10.3)
Creatinine, Ser: 0.67 mg/dL (ref 0.44–1.00)
GFR calc Af Amer: >60 mL/min (ref >60)
Glucose, Bld: 141 mg/dL — ABNORMAL HIGH (ref 65–99)
POTASSIUM: 3.8 mmol/L (ref 3.5–5.1)
SODIUM: 139 mmol/L (ref 135–145)
Total Bilirubin: 0.6 mg/dL (ref 0.3–1.2)
Total Protein: 7.7 g/dL (ref 6.5–8.1)

## 2018-03-21 LAB — CBC WITH DIFFERENTIAL/PLATELET
Basophils Absolute: 0.1 K/uL (ref 0.0–0.1)
Basophils Relative: 0 %
Eosinophils Absolute: 0.3 K/uL (ref 0.0–0.7)
Eosinophils Relative: 2 %
HCT: 41.5 % (ref 36.0–46.0)
Hemoglobin: 13.5 g/dL (ref 12.0–15.0)
Lymphocytes Relative: 28 %
Lymphs Abs: 4.4 K/uL — ABNORMAL HIGH (ref 0.7–4.0)
MCH: 27.3 pg (ref 26.0–34.0)
MCHC: 32.5 g/dL (ref 30.0–36.0)
MCV: 84 fL (ref 78.0–100.0)
Monocytes Absolute: 1 K/uL (ref 0.1–1.0)
Monocytes Relative: 7 %
Neutro Abs: 9.9 K/uL — ABNORMAL HIGH (ref 1.7–7.7)
Neutrophils Relative %: 63 %
Platelets: 341 K/uL (ref 150–400)
RBC: 4.94 MIL/uL (ref 3.87–5.11)
RDW: 13.3 % (ref 11.5–15.5)
WBC: 15.7 K/uL — ABNORMAL HIGH (ref 4.0–10.5)

## 2018-03-21 LAB — LIPASE, BLOOD: Lipase: 44 U/L (ref 11–51)

## 2018-03-21 MED ORDER — ONDANSETRON HCL 4 MG/2ML IJ SOLN
4.0000 mg | Freq: Once | INTRAMUSCULAR | Status: AC
  Administered 2018-03-21: 4 mg via INTRAVENOUS
  Filled 2018-03-21: qty 2

## 2018-03-21 MED ORDER — HYDROMORPHONE HCL 1 MG/ML IJ SOLN
0.5000 mg | Freq: Once | INTRAMUSCULAR | Status: AC
  Administered 2018-03-21: 0.5 mg via INTRAVENOUS
  Filled 2018-03-21: qty 1

## 2018-03-21 NOTE — ED Triage Notes (Signed)
Pt c/o abd pain with n/v/d since Thursday. Pt states she has been dealing chronically from this condition for 2 years.

## 2018-03-21 NOTE — ED Provider Notes (Signed)
[facility] EMERGENCY DEPARTMENT Provider Note   CSN: [id number] Arrival date & time: 03/21/18  2246     History   Chief Complaint Chief Complaint  Patient presents with  . Abdominal Pain    HPI Kristin Bailey is a 57 y.o. female with hx of depression, DM, diverticulitis, HTN, GERD, IBS, who presents to the ED with abdominal pain. Patient reports a hx of chronic abdominal pain that has been ongoing for 2 years. Patient reports n/vd that started 5 days ago. She describes the diarrhea as watery, brown. Patient evaluated here in the ED 02/22/18 and had full work up with CT, labs, EKG. Patient has had elevated WBC on numerous visits and has appointment for evaluation by hematology 5/21/ 2019. Patient evaluated at [facility], GI department 03/19/18 and it was recommended that the patient's PCP do further evaluation including cardiac evaluation, specific labs and imaging. She has a f/u visit scheduled with GI 05/21/18.   HPI  Past Medical History:  Diagnosis Date  . Depression   . Diabetes mellitus without complication ([place])   . Diverticulitis   . GERD (gastroesophageal reflux disease)   . Hypertension   . IBS (irritable bowel syndrome)   . Pneumonia   . Thyroid disease     Patient Active Problem List   Diagnosis Date Noted  . Abdominal pain, left lower quadrant   . Diverticulitis 09/26/2017  . Hyponatremia 09/26/2017  . Noninfectious gastroenteritis 11/28/2016  . Enteritis 11/04/2016  . Lactic acidosis 11/04/2016  . Enterocolitis 03/17/2016  . Gastroenteritis, infectious 03/17/2016  . Leukocytosis 11/28/2015  . Acute respiratory failure with hypoxia ([place]) 11/28/2015  . Controlled type 2 diabetes mellitus without complication, without long-term current use of insulin ([place]) 11/28/2015  . UTI (lower urinary tract infection) 11/27/2015  . Colitis 11/27/2015  . AKI (acute kidney injury) ([place]) 11/27/2015    Past Surgical History:  Procedure Laterality Date  . AGILE CAPSULE N/A 11/23/2016    Procedure: AGILE CAPSULE;  Surgeon: Rogene Houston, MD;  Location: [facility] ENDO SUITE;  Service: Endoscopy;  Laterality: N/A;  8:30  . CARPAL TUNNEL RELEASE    . CESAREAN SECTION    . CHOLECYSTECTOMY    . COLONOSCOPY N/A 08/28/2014   Procedure: COLONOSCOPY;  Surgeon: Rogene Houston, MD;  Location: [facility] ENDO SUITE;  Service: Endoscopy;  Laterality: N/A;  1030  . CYST REMOVAL HAND    . ESOPHAGOGASTRODUODENOSCOPY N/A 03/19/2016   Procedure: ESOPHAGOGASTRODUODENOSCOPY (EGD);  Surgeon: Rogene Houston, MD;  Location: [facility] END[facility] SUITE;  Service: Endoscopy;  Laterality: N/A;  . FLEXIBLE SIGMOIDOSCOPY N/A 09/28/2017   Procedure: FLEXIBLE SIGMOIDOSCOPY;  Surgeon: Rogene Houston, MD;  Location: [facility] ENDO SUITE;  Service: Endoscopy;  Laterality: N/A;  . FRACTURE SURGERY     rt ankle   . GIVENS CAPSULE STUDY N/A 12/15/2016   Procedure: GIVENS CAPSULE STUDY;  Surgeon: Rogene Houston, MD;  Location: [facility];  Service: Endoscopy;  Laterality: N/A;  . KNEE SURGERY     meniscus repair  . TUBAL LIGATION       OB History    Gravida  1   Para  1   Term  1   Preterm      AB      Living  1     SAB      TAB      Ectopic      Multiple      Live Births  Home Medications    Prior to Admission medications   Medication Sig Start Date End Date Taking? Authorizing Provider  acetaminophen (TYLENOL 8 HOUR ARTHRITIS PAIN) 650 MG CR tablet Take 1,300 mg by mouth every morning. *May take additional as needed   Yes [provider]  albuterol (PROVENTIL HFA;VENTOLIN HFA) 108 (90 BASE) MCG/ACT inhaler Inhale 2 puffs into the lungs every 4 (four) hours as needed for wheezing or shortness of breath.   Yes [provider]  ALPRAZolam (XANAX) 0.25 MG tablet Take 0.25 mg by mouth 2 (two) times daily as needed for anxiety.   Yes [provider]  Ascorbic Acid (VITAMIN C) 1000 MG tablet Take 1,000 mg 2 (two) times daily by mouth.   Yes [provider]    aspirin EC 81 MG tablet Take 81 mg by mouth daily.   Yes [provider]  busPIRone (BUSPAR) 5 MG tablet 5 mg twice a day for one week, then three times a week Patient taking differently: Take 5 mg by mouth See admin instructions. 5 mg twice a day for one week, then three times a week 03/05/18  Yes Hisada, Elie Goody, MD  Dulaglutide (TRULICITY) 5.17 OH/6.0VP SOPN Inject 0.5 mLs into the skin every Monday. On  Monday's.    Yes [provider]  DULoxetine (CYMBALTA) 60 MG capsule Take 1 capsule (60 mg total) by mouth 2 (two) times daily. 01/30/18  Yes Hisada, Elie Goody, MD  glipiZIDE (GLUCOTROL) 5 MG tablet Take 5 mg by mouth 2 (two) times daily.    Yes [provider]  HYDROcodone-acetaminophen (NORCO/VICODIN) 5-325 MG tablet Take 1 tablet by mouth every 4 (four) hours as needed. 09/07/17  Yes Tanna Furry, MD  levothyroxine (SYNTHROID, LEVOTHROID) 100 MCG tablet Take 100 mcg daily by mouth.  08/16/17  Yes [provider]  lisinopril-hydrochlorothiazide (PRINZIDE,ZESTORETIC) 20-12.5 MG tablet Take 1 tablet daily by mouth. Resume in 3 days 09/29/17 09/23/18 Yes Kathie Dike, MD  metoCLOPramide (REGLAN) 10 MG tablet Take 1 tablet (10 mg total) by mouth every 6 (six) hours as needed for nausea (or headache). 71/06/26  Yes Delora Fuel, MD  montelukast (SINGULAIR) 10 MG tablet Take 10 mg by mouth at bedtime.   Yes [provider]  omeprazole (PRILOSEC OTC) 20 MG tablet Take 1 tablet (20 mg total) by mouth daily. Patient taking differently: Take 20 mg by mouth every evening.  01/03/18  Yes Rehman, Mechele Dawley, MD  ondansetron (ZOFRAN ODT) 4 MG disintegrating tablet Take 1 tablet (4 mg total) by mouth every 8 (eight) hours as needed for nausea or vomiting. 02/22/18  Yes Long, Wonda Olds, MD  oxycodone (OXY-IR) 5 MG capsule Take 1 capsule (5 mg total) by mouth every 4 (four) hours as needed. 94/85/46  Yes Delora Fuel, MD  traZODone (DESYREL) 100 MG tablet Take 1 tablet (100 mg  total) by mouth at bedtime as needed for sleep. p 01/30/18  Yes Hisada, Elie Goody, MD  valACYclovir (VALTREX) 1000 MG tablet Take 2 g 2 (two) times daily by mouth. 09/23/17  Yes [provider]  ciprofloxacin (CIPRO) 500 MG tablet Take 1 tablet (500 mg total) by mouth 2 (two) times daily. 03/22/18   Ashley Murrain, NP  metroNIDAZOLE (FLAGYL) 500 MG tablet Take 1 tablet (500 mg total) by mouth 2 (two) times daily. 03/22/18   Ashley Murrain, NP  promethazine (PHENERGAN) 25 MG tablet Take 1 tablet (25 mg total) by mouth every 6 (six) hours as needed for nausea or vomiting.  03/22/18   Ashley Murrain, NP    Family History Family History  Problem Relation Age of Onset  . Cancer Mother        colon cancer age74  . Hypertension Mother   . Stroke Mother   . Diabetes Mother   . Non-Hodgkin's lymphoma Father   . Hypertension Father   . Diabetes Father   . Stroke Maternal Grandmother   . Kidney disease Paternal Aunt   . Cancer Paternal Aunt   . Kidney disease Paternal Uncle   . Cancer Paternal Uncle     Social History Social History   Tobacco Use  . Smoking status: Never Smoker  . Smokeless tobacco: Never Used  Substance Use Topics  . Alcohol use: No  . Drug use: No     Allergies   Patient has no known allergies.   Review of Systems Review of Systems  Constitutional: Negative for chills and fever.  HENT: Negative.   Respiratory: Negative for shortness of breath.   Cardiovascular: Negative for chest pain.  Gastrointestinal: Positive for abdominal pain, constipation, diarrhea, nausea and vomiting.  Genitourinary: Negative for difficulty urinating, dysuria and urgency.  Musculoskeletal: Positive for back pain (chronic). Negative for neck stiffness.  Skin: Negative for rash.  Neurological: Negative for weakness and headaches.  Psychiatric/Behavioral: Negative for confusion.     Physical Exam Updated Vital Signs BP (!) 110/53   Pulse 91   Temp 98.7 F (37.1 C)   Resp 18   Ht  5\' 6"  (1.676 m)   Wt 126.1 kg (278 lb)   LMP 03/27/2014 Comment: irregular  SpO2 100%   BMI 44.87 kg/m   Physical Exam  Constitutional: She appears well-developed and well-nourished. No distress.  HENT:  Head: Normocephalic.  Eyes: EOM are normal.  Neck: Neck supple.  Cardiovascular: Normal rate and regular rhythm.  Pulmonary/Chest: Effort normal and breath sounds normal.  Abdominal: Soft. Bowel sounds are normal. There is tenderness in the periumbilical area.    Musculoskeletal: Normal range of motion.  Neurological: She is alert.  Skin: Skin is warm and dry.  Psychiatric: She has a normal mood and affect. Her behavior is normal.  Nursing note and vitals reviewed.    ED Treatments / Results  Labs (all labs ordered are listed, but only abnormal results are displayed) Labs Reviewed  CBC WITH DIFFERENTIAL/PLATELET - Abnormal; Notable for the following components:      Result Value   WBC 15.7 (*)    Neutro Abs 9.9 (*)    Lymphs Abs 4.4 (*)    All other components within normal limits  COMPREHENSIVE METABOLIC PANEL - Abnormal; Notable for the following components:   Chloride 100 (*)    Glucose, Bld 141 (*)    All other components within normal limits  URINALYSIS, ROUTINE W REFLEX MICROSCOPIC - Abnormal; Notable for the following components:   Leukocytes, UA SMALL (*)    Bacteria, UA RARE (*)    All other components within normal limits  LIPASE, BLOOD   Radiology No results found.  Procedures Procedures (including critical care time)  Medications Ordered in ED Medications  sodium chloride 0.9 % bolus 1,000 mL (1,000 mLs Intravenous New Bag/Given 03/22/18 0035)  metroNIDAZOLE (FLAGYL) tablet 500 mg (has no administration in time range)  ciprofloxacin (CIPRO) tablet 500 mg (has no administration in time range)  promethazine (PHENERGAN) injection 12.5 mg (has no administration in time range)  ondansetron (ZOFRAN) injection 4 mg (4 mg Intravenous Given 03/21/18 2324)  HYDROmorphone (DILAUDID) injection 0.5 mg (0.5 mg Intravenous Given 03/21/18 2324)     Initial Impression / Assessment and Plan / ED Course  I have reviewed the triage vital signs and the nursing notes. Patient is nontoxic, nonseptic appearing, in no apparent distress.  Patient's pain and other symptoms adequately managed in emergency department.  Fluid bolus given.  Labs and vitals reviewed.  Patient does not meet the SIRS or Sepsis criteria.  On repeat exam patient does not have a surgical abdomin and there are no peritoneal signs.  No indication of appendicitis, bowel obstruction, bowel perforation or  Cholecystitis, with patient's hx of diverticulitis and her symptoms reportedly the same as when she has diverticulitis, will treat with antibiotics.  Patient discharged home with Flagyl and Cipro and given strict instructions for follow-up with their primary care physician.  I have also discussed reasons to return immediately to the ER.  Patient expresses understanding and agrees with plan.  Dr. Christy Gentles has evaluated this patient as well and agrees with plan.   Final Clinical Impressions(s) / ED Diagnoses   Final diagnoses:  Lower abdominal pain    ED Discharge Orders        Ordered    ciprofloxacin (CIPRO) 500 MG tablet  2 times daily     03/22/18 0058    metroNIDAZOLE (FLAGYL) 500 MG tablet  2 times daily     03/22/18 0058    promethazine (PHENERGAN) 25 MG tablet  Every 6 hours PRN     03/22/18 0058       Ashley Murrain, NP 03/22/18 0104    Ripley Fraise, MD 03/22/18 [phone]

## 2018-03-22 LAB — URINALYSIS, ROUTINE W REFLEX MICROSCOPIC
Bilirubin Urine: NEGATIVE
GLUCOSE, UA: NEGATIVE mg/dL
Hgb urine dipstick: NEGATIVE
KETONES UR: NEGATIVE mg/dL
Nitrite: NEGATIVE
PROTEIN: NEGATIVE mg/dL
Specific Gravity, Urine: 1.013 (ref 1.005–1.030)
pH: 7 (ref 5.0–8.0)

## 2018-03-22 MED ORDER — PROMETHAZINE HCL 25 MG/ML IJ SOLN
12.5000 mg | Freq: Once | INTRAMUSCULAR | Status: AC
  Administered 2018-03-22: 12.5 mg via INTRAVENOUS
  Filled 2018-03-22: qty 1

## 2018-03-22 MED ORDER — CIPROFLOXACIN HCL 250 MG PO TABS
500.0000 mg | ORAL_TABLET | Freq: Once | ORAL | Status: AC
  Administered 2018-03-22: 500 mg via ORAL
  Filled 2018-03-22: qty 2

## 2018-03-22 MED ORDER — METRONIDAZOLE 500 MG PO TABS: 500.0000 mg | ORAL_TABLET | Freq: Two times a day (BID) | ORAL | 0 refills | Status: DC

## 2018-03-22 MED ORDER — PROMETHAZINE HCL 25 MG PO TABS: 25.0000 mg | ORAL_TABLET | Freq: Four times a day (QID) | ORAL | 0 refills | Status: DC | PRN

## 2018-03-22 MED ORDER — METRONIDAZOLE 500 MG PO TABS
500.0000 mg | ORAL_TABLET | Freq: Once | ORAL | Status: AC
  Administered 2018-03-22: 500 mg via ORAL
  Filled 2018-03-22: qty 1

## 2018-03-22 MED ORDER — SODIUM CHLORIDE 0.9 % IV BOLUS
1000.0000 mL | Freq: Once | INTRAVENOUS | Status: AC
  Administered 2018-03-22: 1000 mL via INTRAVENOUS

## 2018-03-22 MED ORDER — CIPROFLOXACIN HCL 500 MG PO TABS: 500.0000 mg | ORAL_TABLET | Freq: Two times a day (BID) | ORAL | 0 refills | Status: DC

## 2018-03-22 NOTE — Discharge Instructions (Signed)
Call Dr. Nevada Crane tomorrow for follow up.

## 2018-03-22 NOTE — ED Provider Notes (Signed)
Patient seen/examined in the Emergency Department in conjunction with Midlevel Provider [facility] Patient reports acute on chronic abdominal pain Exam : Awake alert, no distress.  She is obese.  Mild diffuse abdominal tenderness.  She is nontoxic in appearance Plan: She will be appropriate for outpatient management.  Defer imaging at this time   Ripley Fraise, MD 03/22/18 [phone]

## 2018-04-11 ENCOUNTER — Other Ambulatory Visit (HOSPITAL_COMMUNITY): Payer: Self-pay | Admitting: Internal Medicine

## 2018-04-11 DIAGNOSIS — Z1231 Encounter for screening mammogram for malignant neoplasm of breast: Secondary | ICD-10-CM

## 2018-04-18 ENCOUNTER — Ambulatory Visit: Payer: BLUE CROSS/BLUE SHIELD | Admitting: "Endocrinology

## 2018-04-19 ENCOUNTER — Ambulatory Visit (HOSPITAL_COMMUNITY)
Admission: RE | Admit: 2018-04-19 | Discharge: 2018-04-19 | Disposition: A | Discharge status: Home / Self Care | Payer: BLUE CROSS/BLUE SHIELD | Source: Ambulatory Visit | Attending: Internal Medicine | Admitting: Internal Medicine

## 2018-04-19 ENCOUNTER — Encounter (HOSPITAL_COMMUNITY): Payer: Self-pay

## 2018-04-19 DIAGNOSIS — Z1231 Encounter for screening mammogram for malignant neoplasm of breast: Secondary | ICD-10-CM | POA: Diagnosis present

## 2018-04-24 ENCOUNTER — Ambulatory Visit (INDEPENDENT_AMBULATORY_CARE_PROVIDER_SITE_OTHER): Payer: BLUE CROSS/BLUE SHIELD | Admitting: Licensed Clinical Social Worker

## 2018-04-24 DIAGNOSIS — F331 Major depressive disorder, recurrent, moderate: Secondary | ICD-10-CM | POA: Diagnosis not present

## 2018-04-25 ENCOUNTER — Encounter (HOSPITAL_COMMUNITY): Payer: Self-pay | Admitting: Licensed Clinical Social Worker

## 2018-04-25 NOTE — Progress Notes (Signed)
   THERAPIST PROGRESS NOTE  Session Time: 4:00 pm-4:45 pm  Participation Level: Active  Behavioral Response: CasualAlertAnxious  Type of Therapy: Individual Therapy  Treatment Goals addressed: Coping  Interventions: CBT and Solution Focused  Summary: Kristin Bailey is a 57 y.o. female who presents oriented x5 (person, place, siutation, time, and object), alert, talkative, casually dressed, appropriately groomed, average height, overweight, and cooperative for an assessment on a referral from Dr. Modesta Messing to address mood. Patient has a history of medical treatment including diabetes. She has minimal history of mental health treatment including medication management and couples counseling over 20 years ago. Patient denies symptoms of mania. She denies suicidal and homicidal ideations. Patient denies psychosis including auditory and visual hallucinations. Patient denies substance use. Patient is at low risk for lethality.   Physically: Patient continues to have GI issues. She has knee pain and back pain. She is trying to lose weight and her weight fluctuates.  Spiritually/values: Patient is doing well spiritually. She has "a good relationship with the Lord."  Relationships: Patient is getting along well with others. She has a good relationship with her family.  Emotional/Mental/Behavior: Patient reports that her mood is stable. She has had physical health issues which is her main concern right now. Patient is going to the doctor and getting her issues addressed.   Patient engaged in session. She responded well to interventions. Patient continues to meet criteria for Major depressive disorder, recurrent episode, moderate. Patient will continue in outpatient therapy due to being the least restrictive service to meet her needs. Patient made minimal progress on her goals.    Suicidal/Homicidal: Negativewithout intent/plan  Therapist Response: Therapist reviewed patient's recent thoughts and  behavior. Therapist utilized CBT to address mood. Therapist reviewed patient's goals. Therapist processed patient's feelings to identify triggers for mood. Therapist had patient identify what has gone well.   Plan: Return again in 3 weeks.  Diagnosis: Axis I: Major depressive disorder, recurrent episode, moderate    Axis II: No diagnosis    Glori Bickers, LCSW 04/25/2018

## 2018-04-30 ENCOUNTER — Other Ambulatory Visit (HOSPITAL_COMMUNITY): Payer: Self-pay | Admitting: Psychiatry

## 2018-04-30 MED ORDER — DULOXETINE HCL 60 MG PO CPEP: 60.0000 mg | ORAL_CAPSULE | Freq: Two times a day (BID) | ORAL | 0 refills | Status: DC

## 2018-04-30 NOTE — Progress Notes (Signed)
[place] MD/PA/NP OP Progress Note  05/02/2018 4:19 PM Kristin Bailey  MRN:  [id number]  Chief Complaint:  Chief Complaint    Depression; Follow-up     HPI:  Patient presents for follow-up appointment for depression.  She states that she discontinued the BuSpar after a week as it caused her more anxious.  She has been feeling better after discontinue the medication .  Her friends mother at hospice deceased 2 weeks ago.  Although she felt upset, thinking about her parents who passed away, she was able to handle it well.  She feels good about losing her weight.  She sleeps well with trazodone.  She has fair appetite.  She denies feeling depressed she has good concentration. . She denies SI.  She denies anxiety except around the death of her friend's mother. She denies panic attacks. She has not taken xanax for a while, although she took some for nausea since the last appointment.   Wt Readings from Last 3 Encounters:  05/02/18 270 lb (122.5 kg)  03/21/18 278 lb (126.1 kg)  03/05/18 285 lb (129.3 kg)    Visit Diagnosis: No diagnosis found.  Past Psychiatric History: Please see initial evaluation for full details. I have reviewed the history. No updates at this time.     Past Medical History:  Past Medical History:  Diagnosis Date  . Depression   . Diabetes mellitus without complication ([place])   . Diverticulitis   . GERD (gastroesophageal reflux disease)   . Hypertension   . IBS (irritable bowel syndrome)   . Pneumonia   . Thyroid disease     Past Surgical History:  Procedure Laterality Date  . AGILE CAPSULE N/A 11/23/2016   Procedure: AGILE CAPSULE;  Surgeon: Rogene Houston, MD;  Location: [facility];  Service: Endoscopy;  Laterality: N/A;  8:30  . BREAST EXCISIONAL BIOPSY Right   . CARPAL TUNNEL RELEASE    . CESAREAN SECTION    . CHOLECYSTECTOMY    . COLONOSCOPY N/A 08/28/2014   Procedure: COLONOSCOPY;  Surgeon: Rogene Houston, MD;  Location: [facility];  Service:  Endoscopy;  Laterality: N/A;  1030  . CYST REMOVAL HAND    . ESOPHAGOGASTRODUODENOSCOPY N/A 03/19/2016   Procedure: ESOPHAGOGASTRODUODENOSCOPY (EGD);  Surgeon: Rogene Houston, MD;  Location: [facility];  Service: Endoscopy;  Laterality: N/A;  . FLEXIBLE SIGMOIDOSCOPY N/A 09/28/2017   Procedure: FLEXIBLE SIGMOIDOSCOPY;  Surgeon: Rogene Houston, MD;  Location: [facility];  Service: Endoscopy;  Laterality: N/A;  . FRACTURE SURGERY     rt ankle   . GIVENS CAPSULE STUDY N/A 12/15/2016   Procedure: GIVENS CAPSULE STUDY;  Surgeon: Rogene Houston, MD;  Location: [facility];  Service: Endoscopy;  Laterality: N/A;  . KNEE SURGERY     meniscus repair  . TUBAL LIGATION      Family Psychiatric History: Please see initial evaluation for full details. I have reviewed the history. No updates at this time.     Family History:  Family History  Problem Relation Age of Onset  . Cancer Mother        colon cancer age74  . Hypertension Mother   . Stroke Mother   . Diabetes Mother   . Non-Hodgkin's lymphoma Father   . Hypertension Father   . Diabetes Father   . Stroke Maternal Grandmother   . Kidney disease Paternal Aunt   . Cancer Paternal Aunt   . Kidney disease Paternal Uncle   . Cancer  Paternal Uncle     Social History:  Social History   Socioeconomic History  . Marital status: Married    Spouse name: Not on file  . Number of children: Not on file  . Years of education: Not on file  . Highest education level: Not on file  Occupational History  . Not on file  Social Needs  . Financial resource strain: Not on file  . Food insecurity:    Worry: Not on file    Inability: Not on file  . Transportation needs:    Medical: Not on file    Non-medical: Not on file  Tobacco Use  . Smoking status: Never Smoker  . Smokeless tobacco: Never Used  Substance and Sexual Activity  . Alcohol use: No  . Drug use: No  . Sexual activity: Yes    Birth control/protection: Surgical   Lifestyle  . Physical activity:    Days per week: Not on file    Minutes per session: Not on file  . Stress: Not on file  Relationships  . Social connections:    Talks on phone: Not on file    Gets together: Not on file    Attends religious service: Not on file    Active member of club or organization: Not on file    Attends meetings of clubs or organizations: Not on file    Relationship status: Not on file  Other Topics Concern  . Not on file  Social History Narrative   Works with special needs adults.    Allergies: No Known Allergies  Metabolic Disorder Labs: Lab Results  Component Value Date   HGBA1C 6.5 (H) 03/16/2016   MPG 140 03/16/2016   MPG 140 11/28/2015   No results found for: PROLACTIN No results found for: CHOL, TRIG, HDL, CHOLHDL, VLDL, LDLCALC Lab Results  Component Value Date   TSH 1.49 02/21/2018   TSH 5.142 (H) 09/27/2017    Therapeutic Level Labs: No results found for: LITHIUM No results found for: VALPROATE No components found for:  CBMZ  Current Medications: Current Outpatient Medications  Medication Sig Dispense Refill  . acetaminophen (TYLENOL 8 HOUR ARTHRITIS PAIN) 650 MG CR tablet Take 1,300 mg by mouth every morning. *May take additional as needed    . albuterol (PROVENTIL HFA;VENTOLIN HFA) 108 (90 BASE) MCG/ACT inhaler Inhale 2 puffs into the lungs every 4 (four) hours as needed for wheezing or shortness of breath.    . ALPRAZolam (XANAX) 0.25 MG tablet Take 0.25 mg by mouth 2 (two) times daily as needed for anxiety.    . Ascorbic Acid (VITAMIN C) 1000 MG tablet Take 1,000 mg 2 (two) times daily by mouth.    Marland Kitchen aspirin EC 81 MG tablet Take 81 mg by mouth daily.    . ciprofloxacin (CIPRO) 500 MG tablet Take 1 tablet (500 mg total) by mouth 2 (two) times daily. 14 tablet 0  . Dulaglutide (TRULICITY) 2.58 NI/7.7OE SOPN Inject 0.5 mLs into the skin every Monday. On  Monday's.     . DULoxetine (CYMBALTA) 60 MG capsule Take 1 capsule (60 mg  total) by mouth 2 (two) times daily. 180 capsule 0  . glipiZIDE (GLUCOTROL) 5 MG tablet Take 5 mg by mouth 2 (two) times daily.     Marland Kitchen HYDROcodone-acetaminophen (NORCO/VICODIN) 5-325 MG tablet Take 1 tablet by mouth every 4 (four) hours as needed. 10 tablet 0  . levothyroxine (SYNTHROID, LEVOTHROID) 100 MCG tablet Take 100 mcg daily by mouth.   1  .  lisinopril-hydrochlorothiazide (PRINZIDE,ZESTORETIC) 20-12.5 MG tablet Take 1 tablet daily by mouth. Resume in 3 days 30 tablet 11  . metoCLOPramide (REGLAN) 10 MG tablet Take 1 tablet (10 mg total) by mouth every 6 (six) hours as needed for nausea (or headache). 30 tablet 0  . metroNIDAZOLE (FLAGYL) 500 MG tablet Take 1 tablet (500 mg total) by mouth 2 (two) times daily. 14 tablet 0  . montelukast (SINGULAIR) 10 MG tablet Take 10 mg by mouth at bedtime.    Marland Kitchen omeprazole (PRILOSEC OTC) 20 MG tablet Take 1 tablet (20 mg total) by mouth daily. (Patient taking differently: Take 20 mg by mouth every evening. ) 20 tablet 5  . ondansetron (ZOFRAN ODT) 4 MG disintegrating tablet Take 1 tablet (4 mg total) by mouth every 8 (eight) hours as needed for nausea or vomiting. 20 tablet 0  . oxycodone (OXY-IR) 5 MG capsule Take 1 capsule (5 mg total) by mouth every 4 (four) hours as needed. 12 capsule 0  . promethazine (PHENERGAN) 25 MG tablet Take 1 tablet (25 mg total) by mouth every 6 (six) hours as needed for nausea or vomiting. 30 tablet 0  . traZODone (DESYREL) 100 MG tablet Take 1 tablet (100 mg total) by mouth at bedtime as needed for sleep. p 90 tablet 0  . valACYclovir (VALTREX) 1000 MG tablet Take 2 g 2 (two) times daily by mouth.  2   No current facility-administered medications for this visit.      Musculoskeletal: Strength & Muscle Tone: within normal limits Gait & Station: normal Patient leans: N/A  Psychiatric Specialty Exam: Review of Systems  Psychiatric/Behavioral: Negative for depression, hallucinations, memory loss, substance abuse and  suicidal ideas. The patient is nervous/anxious and has insomnia.   All other systems reviewed and are negative.   Blood pressure 103/70, pulse (!) 55, height 5\' 6"  (1.676 m), weight 270 lb (122.5 kg), last menstrual period 03/27/2014, SpO2 98 %.Body mass index is 43.58 kg/m.  General Appearance: Fairly Groomed  Eye Contact:  Good  Speech:  Clear and Coherent  Volume:  Normal  Mood:  "better"  Affect:  Appropriate, Congruent and smiles  Thought Process:  Coherent  Orientation:  Full (Time, Place, and Person)  Thought Content: Logical   Suicidal Thoughts:  No  Homicidal Thoughts:  No  Memory:  Immediate;   Good  Judgement:  Good  Insight:  Good  Psychomotor Activity:  Normal  Concentration:  Concentration: Good and Attention Span: Good  Recall:  Good  Fund of Knowledge: Good  Language: Good  Akathisia:  No  Handed:  Right  AIMS (if indicated): not done  Assets:  Communication Skills Desire for Improvement  ADL's:  Intact  Cognition: WNL  Sleep:  Good on trazodone   Screenings:   Assessment and Plan:  Kristin Bailey is a 57 y.o. year old female with a history of depression,hypothyroidism, hypertension, IBS,diabetes, sciatica , who presents for follow up appointment for No diagnosis found.  # MDD, moderate, recurrent with anxious distress There has been significant improvement in anxiety since the last appointment.  She could not tolerate BuSpar due to worsening anxiety.  Will continue duloxetine to target depression and anxiety.  Will continue trazodone as needed for insomnia.  Will continue Xanax as needed for anxiety.  Discussed behavioral activation.   Plan 1. Continueduloxetine 60 mg twice a day 2. Discontinue buspar 3.Continuetrazodone 100-150 mg at night as needed for sleep 4. Return to clinic in three months for 15 mins 5.  Continue Xanax 0.25 mg up to twice a day as needed for anxiety (she declines refill)  The patient demonstrates the following risk  factors for suicide: Chronic risk factors for suicide include: psychiatric disorder of depressionand chronic pain. Acute risk factorsfor suicide include: family or marital conflict. Protective factorsfor this patient include: responsibility to others (children, family), coping skills and hope for the future. Considering these factors, the overall suicide risk at this point appears to be low. Patient isappropriate for outpatient follow up.  Past trials of medication: fluoxetine, Effexor, duloxetine, Xanax, buspar (worsening anixety)   Norman Clay, MD 05/02/2018, 4:19 PM

## 2018-05-02 ENCOUNTER — Ambulatory Visit (INDEPENDENT_AMBULATORY_CARE_PROVIDER_SITE_OTHER): Payer: BLUE CROSS/BLUE SHIELD | Admitting: Psychiatry

## 2018-05-02 VITALS — BP 103/70 | HR 55 | Ht 66.0 in | Wt 270.0 lb

## 2018-05-02 DIAGNOSIS — F331 Major depressive disorder, recurrent, moderate: Secondary | ICD-10-CM | POA: Diagnosis not present

## 2018-05-02 MED ORDER — DULOXETINE HCL 60 MG PO CPEP: 60.0000 mg | ORAL_CAPSULE | Freq: Two times a day (BID) | ORAL | 0 refills | Status: DC

## 2018-05-02 MED ORDER — TRAZODONE HCL 100 MG PO TABS: 100.0000 mg | ORAL_TABLET | Freq: Every evening | ORAL | 0 refills | Status: DC | PRN

## 2018-05-02 NOTE — Patient Instructions (Signed)
1. Continueduloxetine 60 mg twice a day 2. Discontinue buspar 3.Continuetrazodone 100-150 mg at night as needed for sleep 4. Return to clinic in three months for 15 mins

## 2018-05-24 ENCOUNTER — Ambulatory Visit (INDEPENDENT_AMBULATORY_CARE_PROVIDER_SITE_OTHER): Payer: BLUE CROSS/BLUE SHIELD | Admitting: Licensed Clinical Social Worker

## 2018-05-24 DIAGNOSIS — F331 Major depressive disorder, recurrent, moderate: Secondary | ICD-10-CM

## 2018-05-25 ENCOUNTER — Encounter (HOSPITAL_COMMUNITY): Payer: Self-pay | Admitting: Licensed Clinical Social Worker

## 2018-05-25 NOTE — Progress Notes (Signed)
   THERAPIST PROGRESS NOTE  Session Time: 4:00 pm-4:45 pm  Participation Level: Active  Behavioral Response: CasualAlertAnxious  Type of Therapy: Individual Therapy  Treatment Goals addressed: Coping  Interventions: CBT and Solution Focused  Summary: Kristin Bailey is a 57 y.o. female who presents oriented x5 (person, place, siutation, time, and object), alert, talkative, casually dressed, appropriately groomed, average height, overweight, and cooperative for an assessment on a referral from Dr. Modesta Messing to address mood. Patient has a history of medical treatment including diabetes. She has minimal history of mental health treatment including medication management and couples counseling over 20 years ago. Patient denies symptoms of mania. She denies suicidal and homicidal ideations. Patient denies psychosis including auditory and visual hallucinations. Patient denies substance use. Patient is at low risk for lethality.   Physically: Patient has some GI issues from time to time. Spiritually/values: Patient is doing well spiritually.  Relationships: Patient had a disagreement with her brother's wife. Her sister in law made a judgement comment about them going on a day trip.  Emotional/Mental/Behavior: Patient reports that her [facility] went out and it is too expensive to fix right now. Patient also noted that she is going to have a reduction in her hours at work. She is going to look for another opportunity with her current job as well as consider looking for a job with a different agency.   Patient engaged in session. She responded well to interventions. Patient continues to meet criteria for Major depressive disorder, recurrent episode, moderate. Patient will continue in outpatient therapy due to being the least restrictive service to meet her needs. Patient made minimal progress on her goals.    Suicidal/Homicidal: Negativewithout intent/plan  Therapist Response: Therapist reviewed patient's  recent thoughts and behavior. Therapist utilized CBT to address mood. Therapist reviewed patient's goals. Therapist processed patient's feelings to identify triggers for mood. Therapist discussed patient's stressors and how to manage them through taking small steps.   Plan: Return again in 3 weeks.  Diagnosis: Axis I: Major depressive disorder, recurrent episode, moderate    Axis II: No diagnosis    Glori Bickers, LCSW 05/25/2018

## 2018-06-21 ENCOUNTER — Ambulatory Visit (HOSPITAL_COMMUNITY): Payer: BLUE CROSS/BLUE SHIELD | Admitting: Licensed Clinical Social Worker

## 2018-07-06 ENCOUNTER — Ambulatory Visit (INDEPENDENT_AMBULATORY_CARE_PROVIDER_SITE_OTHER): Payer: BLUE CROSS/BLUE SHIELD | Admitting: Licensed Clinical Social Worker

## 2018-07-06 ENCOUNTER — Encounter (HOSPITAL_COMMUNITY): Payer: Self-pay | Admitting: Licensed Clinical Social Worker

## 2018-07-06 DIAGNOSIS — F331 Major depressive disorder, recurrent, moderate: Secondary | ICD-10-CM | POA: Diagnosis not present

## 2018-07-06 NOTE — Progress Notes (Signed)
   THERAPIST PROGRESS NOTE  Session Time: 9:00 am-9:45 am  Participation Level: Active  Behavioral Response: CasualAlertAnxious  Type of Therapy: Individual Therapy  Treatment Goals addressed: Coping  Interventions: CBT and Solution Focused  Summary: Kristin Bailey is a 56 y.o. female who presents oriented x5 (person, place, siutation, time, and object), alert, talkative, casually dressed, appropriately groomed, average height, overweight, and cooperative for an assessment on a referral from Dr. Modesta Messing to address mood. Patient has a history of medical treatment including diabetes. She has minimal history of mental health treatment including medication management and couples counseling over 20 years ago. Patient denies symptoms of mania. She denies suicidal and homicidal ideations. Patient denies psychosis including auditory and visual hallucinations. Patient denies substance use. Patient is at low risk for lethality.   Physically: Patient has had difficulty sleeping due to anxiety.  Spiritually/values: No issues identified.   Relationships: No issues identified.  Emotional/Mental/Behavior: Patient reports stress related to her job and finances. Patient was able to find two jobs and is waiting on the full time hours from them. Her AC is still out which impacts her sleep as well as she admits that she plays video games before bed which she recognizes stimulates her brain. Patient agreed to control what she can control during this time including her night routine (playing games). Patient also agreed to write out her worries earlier in the day so she can get some of the anxiety out before bed..   Patient engaged in session. She responded well to interventions. Patient continues to meet criteria for Major depressive disorder, recurrent episode, moderate. Patient will continue in outpatient therapy due to being the least restrictive service to meet her needs. Patient made minimal progress on her  goals.    Suicidal/Homicidal: Negativewithout intent/plan  Therapist Response: Therapist reviewed patient's recent thoughts and behavior. Therapist utilized CBT to address mood. Therapist reviewed patient's goals. Therapist processed patient's feelings to identify triggers for mood. Therapist discussed with patient controlling what she can control.   Plan: Return again in 3 weeks.  Diagnosis: Axis I: Major depressive disorder, recurrent episode, moderate    Axis II: No diagnosis    Glori Bickers, LCSW 07/06/2018

## 2018-07-11 ENCOUNTER — Other Ambulatory Visit: Payer: Self-pay

## 2018-07-11 ENCOUNTER — Encounter (HOSPITAL_COMMUNITY): Payer: Self-pay | Admitting: Emergency Medicine

## 2018-07-11 ENCOUNTER — Emergency Department (HOSPITAL_COMMUNITY)
Admission: EM | Admit: 2018-07-11 | Discharge: 2018-07-11 | Disposition: A | Discharge status: Home / Self Care | Payer: BLUE CROSS/BLUE SHIELD | Attending: Emergency Medicine | Admitting: Emergency Medicine

## 2018-07-11 ENCOUNTER — Emergency Department (HOSPITAL_COMMUNITY): Payer: BLUE CROSS/BLUE SHIELD

## 2018-07-11 DIAGNOSIS — Z7984 Long term (current) use of oral hypoglycemic drugs: Secondary | ICD-10-CM | POA: Diagnosis not present

## 2018-07-11 DIAGNOSIS — I10 Essential (primary) hypertension: Secondary | ICD-10-CM | POA: Insufficient documentation

## 2018-07-11 DIAGNOSIS — E119 Type 2 diabetes mellitus without complications: Secondary | ICD-10-CM | POA: Insufficient documentation

## 2018-07-11 DIAGNOSIS — H53143 Visual discomfort, bilateral: Secondary | ICD-10-CM | POA: Diagnosis not present

## 2018-07-11 DIAGNOSIS — Z79899 Other long term (current) drug therapy: Secondary | ICD-10-CM | POA: Diagnosis not present

## 2018-07-11 DIAGNOSIS — H81399 Other peripheral vertigo, unspecified ear: Secondary | ICD-10-CM | POA: Insufficient documentation

## 2018-07-11 DIAGNOSIS — H538 Other visual disturbances: Secondary | ICD-10-CM | POA: Diagnosis present

## 2018-07-11 LAB — COMPREHENSIVE METABOLIC PANEL
ALBUMIN: 4 g/dL (ref 3.5–5.0)
ALK PHOS: 85 U/L (ref 38–126)
ALT: 27 U/L (ref 0–44)
ANION GAP: 9 (ref 5–15)
AST: 20 U/L (ref 15–41)
BUN: 16 mg/dL (ref 6–20)
CALCIUM: 9.3 mg/dL (ref 8.9–10.3)
CHLORIDE: 102 mmol/L (ref 98–111)
CO2: 28 mmol/L (ref 22–32)
Creatinine, Ser: 0.75 mg/dL (ref 0.44–1.00)
GFR calc Af Amer: >60 mL/min (ref >60)
GFR calc non Af Amer: >60 mL/min (ref >60)
GLUCOSE: 112 mg/dL — AB (ref 70–99)
Potassium: 4.2 mmol/L (ref 3.5–5.1)
SODIUM: 139 mmol/L (ref 135–145)
Total Bilirubin: 0.4 mg/dL (ref 0.3–1.2)
Total Protein: 8 g/dL (ref 6.5–8.1)

## 2018-07-11 LAB — CBC
HCT: 41.9 % (ref 36.0–46.0)
Hemoglobin: 13.7 g/dL (ref 12.0–15.0)
MCH: 28 pg (ref 26.0–34.0)
MCHC: 32.7 g/dL (ref 30.0–36.0)
MCV: 85.5 fL (ref 78.0–100.0)
PLATELETS: 311 10*3/uL (ref 150–400)
RBC: 4.9 MIL/uL (ref 3.87–5.11)
RDW: 14.4 % (ref 11.5–15.5)
WBC: 16 10*3/uL — ABNORMAL HIGH (ref 4.0–10.5)

## 2018-07-11 LAB — URINALYSIS, ROUTINE W REFLEX MICROSCOPIC
Bilirubin Urine: NEGATIVE
GLUCOSE, UA: NEGATIVE mg/dL
Hgb urine dipstick: NEGATIVE
KETONES UR: NEGATIVE mg/dL
LEUKOCYTES UA: NEGATIVE
Nitrite: NEGATIVE
PH: 5 (ref 5.0–8.0)
Protein, ur: NEGATIVE mg/dL
SPECIFIC GRAVITY, URINE: 1.015 (ref 1.005–1.030)

## 2018-07-11 LAB — CBG MONITORING, ED: Glucose-Capillary: 124 mg/dL — ABNORMAL HIGH (ref 70–99)

## 2018-07-11 MED ORDER — ONDANSETRON HCL 4 MG PO TABS: 4.0000 mg | ORAL_TABLET | Freq: Four times a day (QID) | ORAL | 0 refills | Status: DC

## 2018-07-11 MED ORDER — ONDANSETRON 4 MG PO TBDP
4.0000 mg | ORAL_TABLET | Freq: Once | ORAL | Status: AC
  Administered 2018-07-11: 4 mg via ORAL
  Filled 2018-07-11: qty 1

## 2018-07-11 MED ORDER — MECLIZINE HCL 25 MG PO TABS: 25.0000 mg | ORAL_TABLET | Freq: Three times a day (TID) | ORAL | 0 refills | Status: DC | PRN

## 2018-07-11 MED ORDER — MECLIZINE HCL 12.5 MG PO TABS
25.0000 mg | ORAL_TABLET | Freq: Once | ORAL | Status: AC
  Administered 2018-07-11: 25 mg via ORAL
  Filled 2018-07-11: qty 2

## 2018-07-11 MED ORDER — TETRACAINE HCL 0.5 % OP SOLN
2.0000 [drp] | Freq: Once | OPHTHALMIC | Status: AC
  Administered 2018-07-11: 2 [drp] via OPHTHALMIC
  Filled 2018-07-11: qty 4

## 2018-07-11 NOTE — ED Notes (Signed)
Notified Dr. Lita Mains of pt.

## 2018-07-11 NOTE — ED Triage Notes (Signed)
Pt states around 1730 she walked outside of her house and noticed the signs on the road were blurry. Pt reports blurred vision and feeling "swimmy headed."

## 2018-07-11 NOTE — ED Provider Notes (Signed)
[facility] EMERGENCY DEPARTMENT Provider Note   CSN: [id number] Arrival date & time: 07/11/18  1844     History   Chief Complaint Chief Complaint  Patient presents with  . Blurred Vision    HPI Kristin Bailey is a 57 y.o. female.  HPI Patient presents with bilateral blurred vision, photophobia, frontal headache and feeling "swimmy headed".  States this started around 5:15 while she was driving.  She denies any focal weakness or numbness.  Denies eye discharge or known eye trauma.  Does not wear contact lenses.  No new medications. Past Medical History:  Diagnosis Date  . Depression   . Diabetes mellitus without complication ([place])   . Diverticulitis   . GERD (gastroesophageal reflux disease)   . Hypertension   . IBS (irritable bowel syndrome)   . Pneumonia   . Thyroid disease     Patient Active Problem List   Diagnosis Date Noted  . Abdominal pain, left lower quadrant   . Diverticulitis 09/26/2017  . Hyponatremia 09/26/2017  . Noninfectious gastroenteritis 11/28/2016  . Enteritis 11/04/2016  . Lactic acidosis 11/04/2016  . Enterocolitis 03/17/2016  . Gastroenteritis, infectious 03/17/2016  . Leukocytosis 11/28/2015  . Acute respiratory failure with hypoxia ([place]) 11/28/2015  . Controlled type 2 diabetes mellitus without complication, without long-term current use of insulin ([place]) 11/28/2015  . UTI (lower urinary tract infection) 11/27/2015  . Colitis 11/27/2015  . AKI (acute kidney injury) ([place]) 11/27/2015    Past Surgical History:  Procedure Laterality Date  . AGILE CAPSULE N/A 11/23/2016   Procedure: AGILE CAPSULE;  Surgeon: Rogene Houston, MD;  Location: [facility] ENDO SU[facility];  Service: Endoscopy;  Laterality: N/A;  8:30  . BREAST EXCISIONAL BIOPSY Right   . CARPAL TUNNEL RELEASE    . CESAREAN SECTION    . CHOLECYSTECTOMY    . COLONOSCOPY N/A 08/28/2014   Procedure: COLONOSCOPY;  Surgeon: Rogene Houston, MD;  Location: [facility] ENDO SUITE;  Service: Endoscopy;   Laterality: N/A;  1030  . CYST REMOVAL HAND    . ESOPHAGOGASTRODUODENOSCOPY N/A 03/19/2016   Procedure: ESOPHAGOGASTRODUODENOSCOPY (EGD);  Surgeon: Rogene Houston, MD;  Location: [facility] SUITE;  Service: Endoscopy;  Laterality: N/A;  . FLEXIBLE SIGMOIDOSCOPY N/A 09/28/2017   Procedure: FLEXIBLE SIGMOIDOSCOPY;  Surgeon: Rogene Houston, MD;  Location: [facility];  Service: Endoscopy;  Laterality: N/A;  . FRACTURE SURGERY     rt ankle   . GIVENS CAPSULE STUDY N/A 12/15/2016   Procedure: GIVENS CAPSULE STUDY;  Surgeon: Rogene Houston, MD;  Location: [facility] END[facility];  Service: Endoscopy;  Laterality: N/A;  . KNEE SURGERY     meniscus repair  . TUBAL LIGATION       OB History    Gravida  1   Para  1   Term  1   Preterm      AB      Living  1     SAB      TAB      Ectopic      Multiple      Live Births               Home Medications    Prior to Admission medications   Medication Sig Start Date End Date Taking? Authorizing Provider  acetaminophen (TYLENOL 8 HOUR ARTHRITIS PAIN) 650 MG CR tablet Take 1,300 mg by mouth every morning. *May take additional as needed    [provider]  albuterol (PROVENTIL HFA;VENTOLIN HFA) 108 (  90 BASE) MCG/ACT inhaler Inhale 2 puffs into the lungs every 4 (four) hours as needed for wheezing or shortness of breath.    [provider]  ALPRAZolam Duanne Moron) 0.25 MG tablet Take 0.25 mg by mouth 2 (two) times daily as needed for anxiety.    [provider]  Ascorbic Acid (VITAMIN C) 1000 MG tablet Take 1,000 mg 2 (two) times daily by mouth.    [provider]  aspirin EC 81 MG tablet Take 81 mg by mouth daily.    [provider]  ciprofloxacin (CIPRO) 500 MG tablet Take 1 tablet (500 mg total) by mouth 2 (two) times daily. 03/22/18   Ashley Murrain, NP  Dulaglutide (TRULICITY) 6.96 VE/9.3YB SOPN Inject 0.5 mLs into the skin every Monday. On  Monday's.     [provider]  DULoxetine (CYMBALTA)  60 MG capsule Take 1 capsule (60 mg total) by mouth 2 (two) times daily. 05/02/18   Norman Clay, MD  glipiZIDE (GLUCOTROL) 5 MG tablet Take 5 mg by mouth 2 (two) times daily.     [provider]  HYDROcodone-acetaminophen (NORCO/VICODIN) 5-325 MG tablet Take 1 tablet by mouth every 4 (four) hours as needed. 09/07/17   Tanna Furry, MD  levothyroxine (SYNTHROID, LEVOTHROID) 100 MCG tablet Take 100 mcg daily by mouth.  08/16/17   [provider]  lisinopril-hydrochlorothiazide (PRINZIDE,ZESTORETIC) 20-12.5 MG tablet Take 1 tablet daily by mouth. Resume in 3 days 09/29/17 09/23/18  Kathie Dike, MD  meclizine (ANTIVERT) 25 MG tablet Take 1 tablet (25 mg total) by mouth 3 (three) times daily as needed for dizziness. 07/11/18   Julianne Rice, MD  metoCLOPramide (REGLAN) 10 MG tablet Take 1 tablet (10 mg total) by mouth every 6 (six) hours as needed for nausea (or headache). 01/75/10   Delora Fuel, MD  metroNIDAZOLE (FLAGYL) 500 MG tablet Take 1 tablet (500 mg total) by mouth 2 (two) times daily. 03/22/18   Ashley Murrain, NP  montelukast (SINGULAIR) 10 MG tablet Take 10 mg by mouth at bedtime.    [provider]  omeprazole (PRILOSEC OTC) 20 MG tablet Take 1 tablet (20 mg total) by mouth daily. Patient taking differently: Take 20 mg by mouth every evening.  01/03/18   Rehman, Mechele Dawley, MD  ondansetron (ZOFRAN ODT) 4 MG disintegrating tablet Take 1 tablet (4 mg total) by mouth every 8 (eight) hours as needed for nausea or vomiting. 02/22/18   Long, Wonda Olds, MD  ondansetron (ZOFRAN) 4 MG tablet Take 1 tablet (4 mg total) by mouth every 6 (six) hours. 07/11/18   Julianne Rice, MD  oxycodone (OXY-IR) 5 MG capsule Take 1 capsule (5 mg total) by mouth every 4 (four) hours as needed. 25/85/27   Delora Fuel, MD  promethazine (PHENERGAN) 25 MG tablet Take 1 tablet (25 mg total) by mouth every 6 (six) hours as needed for nausea or vomiting. 03/22/18   Ashley Murrain, NP  traZODone  (DESYREL) 100 MG tablet Take 1 tablet (100 mg total) by mouth at bedtime as needed for sleep. p 05/02/18   Norman Clay, MD  valACYclovir (VALTREX) 1000 MG tablet Take 2 g 2 (two) times daily by mouth. 09/23/17   [provider]    Family History Family History  Problem Relation Age of Onset  . Cancer Mother        colon cancer age74  . Hypertension Mother   . Stroke Mother   . Diabetes Mother   . Non-Hodgkin's lymphoma  Father   . Hypertension Father   . Diabetes Father   . Stroke Maternal Grandmother   . Kidney disease Paternal Aunt   . Cancer Paternal Aunt   . Kidney disease Paternal Uncle   . Cancer Paternal Uncle     Social History Social History   Tobacco Use  . Smoking status: Never Smoker  . Smokeless tobacco: Never Used  Substance Use Topics  . Alcohol use: No  . Drug use: No     Allergies   Patient has no known allergies.   Review of Systems Review of Systems  Constitutional: Negative for chills and fever.  HENT: Negative for sinus pressure, sinus pain, sore throat and trouble swallowing.   Eyes: Positive for photophobia, pain and visual disturbance. Negative for discharge and redness.  Respiratory: Negative for cough and shortness of breath.   Cardiovascular: Negative for chest pain.  Gastrointestinal: Negative for abdominal pain, diarrhea, nausea and vomiting.  Genitourinary: Negative for flank pain and frequency.  Musculoskeletal: Negative for back pain, gait problem, myalgias and neck pain.  Skin: Negative for rash and wound.  Neurological: Positive for dizziness, light-headedness and headaches. Negative for speech difficulty, weakness and numbness.  All other systems reviewed and are negative.    Physical Exam Updated Vital Signs BP 114/68   Pulse 82   Temp 98.1 F (36.7 C)   Resp 16   Ht 5\' 6"  (1.676 m)   Wt 123.8 kg   LMP 03/27/2014 Comment: irregular  SpO2 98%   BMI 44.06 kg/m   Physical Exam  Constitutional: She is  oriented to person, place, and time. She appears well-developed and well-nourished. No distress.  HENT:  Head: Normocephalic and atraumatic.  Mouth/Throat: Oropharynx is clear and moist. No oropharyngeal exudate.  Eyes: Pupils are equal, round, and reactive to light. EOM are normal.  Pupils 4 mm and reactive.  Has direct photophobia in both eyes left greater than right.  No conjunctival injection.  No hyphema.  Patient has few beats of horizontal nystagmus which is fatigable. IOP right eye 21 IOP left eye 17  Neck: Normal range of motion. Neck supple. No JVD present.  Cardiovascular: Normal rate and regular rhythm. Exam reveals no gallop and no friction rub.  No murmur heard. Pulmonary/Chest: Effort normal and breath sounds normal. No stridor. No respiratory distress. She has no wheezes. She has no rales. She exhibits no tenderness.  Abdominal: Soft. Bowel sounds are normal. There is no tenderness. There is no rebound and no guarding.  Musculoskeletal: Normal range of motion. She exhibits no edema or tenderness.  Lymphadenopathy:    She has no cervical adenopathy.  Neurological: She is alert and oriented to person, place, and time.  Patient is alert and oriented x3 with clear, goal oriented speech.  Cranial nerves II through XII intact. Patient has 5/5 motor in all extremities. Sensation is intact to light touch. Bilateral finger-to-nose is normal with no signs of dysmetria. Patient has a normal gait and walks without assistance.  Skin: Skin is warm and dry. No rash noted. She is not diaphoretic. No erythema.  Psychiatric: She has a normal mood and affect. Her behavior is normal.  Nursing note and vitals reviewed.    ED Treatments / Results  Labs (all labs ordered are listed, but only abnormal results are displayed) Labs Reviewed  CBC - Abnormal; Notable for the following components:      Result Value   WBC 16.0 (*)    All other components within normal  limits  COMPREHENSIVE METABOLIC  PANEL - Abnormal; Notable for the following components:   Glucose, Bld 112 (*)    All other components within normal limits  CBG MONITORING, ED - Abnormal; Notable for the following components:   Glucose-Capillary 124 (*)    All other components within normal limits  URINALYSIS, ROUTINE W REFLEX MICROSCOPIC  CBG MONITORING, ED    EKG EKG Interpretation  Date/Time:  Wednesday July 11 2018 19:32:40 EDT Ventricular Rate:  86 PR Interval:    QRS Duration: 91 QT Interval:  341 QTC Calculation: 408 R Axis:   45 Text Interpretation:  Sinus rhythm Borderline T wave abnormalities Confirmed by Julianne Rice [phone]) on 07/11/2018 8:15:23 PM   Radiology Ct Head Wo Contrast  Result Date: 07/11/2018 CLINICAL DATA:  Blurred vision.  Light sensitivity. EXAM: CT HEAD WITHOUT CONTRAST TECHNIQUE: Contiguous axial images were obtained from the base of the skull through the vertex without intravenous contrast. COMPARISON:  None. FINDINGS: Brain: Gray-white differentiation is maintained. No CT evidence of acute large territory infarct. No intraparenchymal or extra-axial mass or hemorrhage. Normal size and configuration of the ventricles and the basilar cisterns. No midline shift. Vascular: No hyperdense vessel or unexpected calcification. Skull: No displaced calvarial fracture. Sinuses/Orbits: Limited visualization of the paranasal sinuses and mastoid air cells is normal. No air-fluid levels. Other: Regional soft tissues appear normal. IMPRESSION: Negative noncontrast head CT. Electronically Signed   By: Sandi Mariscal M.D.   On: 07/11/2018 21:11    Procedures Procedures (including critical care time)  Medications Ordered in ED Medications  ondansetron (ZOFRAN-ODT) disintegrating tablet 4 mg (has no administration in time range)  tetracaine (PONTOCAINE) 0.5 % ophthalmic solution 2 drop (2 drops Both Eyes Given 07/11/18 2022)  meclizine (ANTIVERT) tablet 25 mg (25 mg Oral Given 07/11/18 2108)      Initial Impression / Assessment and Plan / ED Course  I have reviewed the triage vital signs and the nursing notes.  Pertinent labs & imaging results that were available during my care of the patient were reviewed by me and considered in my medical decision making (see chart for details).     Patient ambulating without difficulty.  States her dizziness has significantly improved after meclizine.  Has a history of peripheral vertigo.  CT head without acute findings.  Low suspicion for acute CVA.  Intraocular pressure is normal.  Low suspicion for acute angle glaucoma.  Difficult to get a retinal exam.  Have encouraged her to follow-up closely with her ophthalmologist.  Return precautions have been given.  Final Clinical Impressions(s) / ED Diagnoses   Final diagnoses:  Peripheral vertigo, unspecified laterality  Photophobia of both eyes    ED Discharge Orders         Ordered    meclizine (ANTIVERT) 25 MG tablet  3 times daily PRN     07/11/18 2141    ondansetron (ZOFRAN) 4 MG tablet  Every 6 hours     07/11/18 2141           Julianne Rice, MD 07/11/18 2143

## 2018-07-11 NOTE — ED Notes (Signed)
Pt states she was driving x 2 hours ago when she started having sensitivity to light and noticed the signs were blurry; pt states she had some nausea and c/o frontal headache

## 2018-07-11 NOTE — ED Notes (Signed)
Pt assisted to the bathroom and back to bed with minimal assistance

## 2018-07-26 NOTE — Progress Notes (Deleted)
[facility] MD/PA/NP OP Progress Note  07/26/2018 10:09 AM Kristin Bailey  MRN:  [id number]  Chief Complaint:  HPI: *** Visit Diagnosis: No diagnosis found.  Past Psychiatric History: Please see initial evaluation for full details. I have reviewed the history. No updates at this time.     Past Medical History:  Past Medical History:  Diagnosis Date  . Depression   . Diabetes mellitus without complication ([place])   . Diverticulitis   . GERD (gastroesophageal reflux disease)   . Hypertension   . IBS (irritable bowel syndrome)   . Pneumonia   . Thyroid disease     Past Surgical History:  Procedure Laterality Date  . AGILE CAPSULE N/A 11/23/2016   Procedure: AGILE CAPSULE;  Surgeon: Rogene Houston, MD;  Location[facility];  Service: Endoscopy;  Laterality: N/A;  8:30  . BREAST EXCISIONAL BIOPSY Right   . CARPAL TUNNEL RELEASE    . CESAREAN SECTION    . CHOLECYSTECTOMY    . COLONOSCOPY N/A 08/28/2014   Procedure: COLONOSCOPY;  Surgeon: Rogene Houston, MD;  [facility];  Service: Endoscopy;  Laterality: N/A;  1030  . CYST REMOVAL HAND    . ESOPHAGOGASTRODUODENOSCOPY N/A 03/19/2016   Procedure: ESOPHAGOGASTRODUODENOSCOPY (EGD);  Surgeon: Rogene Houston, MD;  [facility];  Service: Endoscopy;  Laterality: N/A;  . FLEXIBLE SIGMOIDOSCOPY N/A 09/28/2017   Procedure: FLEXIBLE SIGMOIDOSCOPY;  Surgeon: Rogene Houston, MD;  [facility];  Service: Endoscopy;  Laterality: N/A;  . FRACTURE SURGERY     rt ankle   . GIVENS CAPSULE STUDY N/A 12/15/2016   Procedure: GIVENS CAPSULE STUDY;  Surgeon: Rogene Houston, MD;  [facility];  Service: Endoscopy;  Laterality: N/A;  . KNEE SURGERY     meniscus repair  . TUBAL LIGATION      Family Psychiatric History: Please see initial evaluation for full details. I have reviewed the history. No updates at this time.     Family History:  Family History  Problem Relation Age of Onset  . Cancer Mother        colon cancer age74  . Hypertension Mother   . Stroke Mother   . Diabetes Mother   . Non-Hodgkin's lymphoma Father   . Hypertension Father   . Diabetes Father   . Stroke Maternal Grandmother   . Kidney disease Paternal Aunt   . Cancer Paternal Aunt   . Kidney disease Paternal Uncle   . Cancer Paternal Uncle     Social History:  Social History   Socioeconomic History  . Marital status: Married    Spouse name: Not on file  . Number of children: Not on file  . Years of education: Not on file  . Highest education level: Not on file  Occupational History  . Not on file  Social Needs  . Financial resource strain: Not on file  . Food insecurity:    Worry: Not on file    Inability: Not on file  . Transportation needs:    Medical: Not on file    Non-medical: Not on file  Tobacco Use  . Smoking status: Never Smoker  . Smokeless tobacco: Never Used  Substance and Sexual Activity  . Alcohol use: No  . Drug use: No  . Sexual activity: Yes    Birth control/protection: Surgical  Lifestyle  . Physical activity:    Days per week: Not on file    Minutes per session: Not on  file  . Stress: Not on file  Relationships  . Social connections:    Talks on phone: Not on file    Gets together: Not on file    Attends religious service: Not on file    Active member of club or organization: Not on file    Attends meetings of clubs or organizations: Not on file    Relationship status: Not on file  Other Topics Concern  . Not on file  Social History Narrative   Works with special needs adults.    Allergies: No Known Allergies  Metabolic Disorder Labs: Lab Results  Component Value Date   HGBA1C 6.5 (H) 03/16/2016   MPG 140 03/16/2016   MPG 140 11/28/2015   No results found for: PROLACTIN No results found for: CHOL, TRIG, HDL, CHOLHDL, VLDL, LDLCALC Lab Results  Component Value Date   TSH 1.49 02/21/2018   TSH 5.142 (H) 09/27/2017    Therapeutic Level Labs: No results  found for: LITHIUM No results found for: VALPROATE No components found for:  CBMZ  Current Medications: Current Outpatient Medications  Medication Sig Dispense Refill  . acetaminophen (TYLENOL 8 HOUR ARTHRITIS PAIN) 650 MG CR tablet Take 1,300 mg by mouth every morning. *May take additional as needed    . albuterol (PROVENTIL HFA;VENTOLIN HFA) 108 (90 BASE) MCG/ACT inhaler Inhale 2 puffs into the lungs every 4 (four) hours as needed for wheezing or shortness of breath.    . ALPRAZolam (XANAX) 0.25 MG tablet Take 0.25 mg by mouth 2 (two) times daily as needed for anxiety.    . Ascorbic Acid (VITAMIN C) 1000 MG tablet Take 1,000 mg 2 (two) times daily by mouth.    Marland Kitchen aspirin EC 81 MG tablet Take 81 mg by mouth daily.    . ciprofloxacin (CIPRO) 500 MG tablet Take 1 tablet (500 mg total) by mouth 2 (two) times daily. 14 tablet 0  . Dulaglutide (TRULICITY) 1.61 WR/6.0AV SOPN Inject 0.5 mLs into the skin every Monday. On  Monday's.     . DULoxetine (CYMBALTA) 60 MG capsule Take 1 capsule (60 mg total) by mouth 2 (two) times daily. 180 capsule 0  . glipiZIDE (GLUCOTROL) 5 MG tablet Take 5 mg by mouth 2 (two) times daily.     Marland Kitchen HYDROcodone-acetaminophen (NORCO/VICODIN) 5-325 MG tablet Take 1 tablet by mouth every 4 (four) hours as needed. 10 tablet 0  . levothyroxine (SYNTHROID, LEVOTHROID) 100 MCG tablet Take 100 mcg daily by mouth.   1  . lisinopril-hydrochlorothiazide (PRINZIDE,ZESTORETIC) 20-12.5 MG tablet Take 1 tablet daily by mouth. Resume in 3 days 30 tablet 11  . meclizine (ANTIVERT) 25 MG tablet Take 1 tablet (25 mg total) by mouth 3 (three) times daily as needed for dizziness. 30 tablet 0  . metoCLOPramide (REGLAN) 10 MG tablet Take 1 tablet (10 mg total) by mouth every 6 (six) hours as needed for nausea (or headache). 30 tablet 0  . metroNIDAZOLE (FLAGYL) 500 MG tablet Take 1 tablet (500 mg total) by mouth 2 (two) times daily. 14 tablet 0  . montelukast (SINGULAIR) 10 MG tablet Take 10 mg by  mouth at bedtime.    Marland Kitchen omeprazole (PRILOSEC OTC) 20 MG tablet Take 1 tablet (20 mg total) by mouth daily. (Patient taking differently: Take 20 mg by mouth every evening. ) 20 tablet 5  . ondansetron (ZOFRAN ODT) 4 MG disintegrating tablet Take 1 tablet (4 mg total) by mouth every 8 (eight) hours as needed for nausea or vomiting. [place]  tablet 0  . ondansetron (ZOFRAN) 4 MG tablet Take 1 tablet (4 mg total) by mouth every 6 (six) hours. 12 tablet 0  . oxycodone (OXY-IR) 5 MG capsule Take 1 capsule (5 mg total) by mouth every 4 (four) hours as needed. 12 capsule 0  . promethazine (PHENERGAN) 25 MG tablet Take 1 tablet (25 mg total) by mouth every 6 (six) hours as needed for nausea or vomiting. 30 tablet 0  . traZODone (DESYREL) 100 MG tablet Take 1 tablet (100 mg total) by mouth at bedtime as needed for sleep. p 90 tablet 0  . valACYclovir (VALTREX) 1000 MG tablet Take 2 g 2 (two) times daily by mouth.  2   No current facility-administered medications for this visit.      Musculoskeletal: Strength & Muscle Tone: within normal limits Gait & Station: normal Patient leans: N/A  Psychiatric Specialty Exam: ROS  Last menstrual period 03/27/2014.There is no height or weight on file to calculate BMI.  General Appearance: Fairly Groomed  Eye Contact:  Good  Speech:  Clear and Coherent  Volume:  Normal  Mood:  {BHH MOOD:22306}  Affect:  {Affect (PAA):22687}  Thought Process:  Coherent  Orientation:  Full (Time, Place, and Person)  Thought Content: Logical   Suicidal Thoughts:  {ST/HT (PAA):22692}  Homicidal Thoughts:  {ST/HT (PAA):22692}  Memory:  Immediate;   Good  Judgement:  {Judgement (PAA):22694}  Insight:  {Insight (PAA):22695}  Psychomotor Activity:  Normal  Concentration:  Concentration: Good and Attention Span: Good  Recall:  Good  Fund of Knowledge: Good  Language: Good  Akathisia:  No  Handed:  Right  AIMS (if indicated): not done  Assets:  Communication Skills Desire for  Improvement  ADL's:  Intact  Cognition: WNL  Sleep:  {BHH GOOD/FAIR/POOR:22877}   Screenings:   Assessment and Plan:  Kristin Bailey is a 57 y.o. year old female with a history of depression,hypothyroidism, hypertension, IBS,diabetes, sciatica  , who presents for follow up appointment for No diagnosis found.  # MDD, moderate, recurrent wit anxious distress There has been significant improvement in anxiety since the last appointment.  She could not tolerate BuSpar due to worsening anxiety.  Will continue duloxetine to target depression and anxiety.  Will continue trazodone as needed for insomnia.  Will continue Xanax as needed for anxiety.  Discussed behavioral activation.   Plan 1. Continueduloxetine 60 mg twice a day 2. Discontinue buspar 3.Continuetrazodone 100-150 mg at night as needed for sleep 4.Return to clinic in three months for 15 mins 5. Continue Xanax0.25 mg up to twice a day as needed for anxiety (she declines refill)  Past trials of medication: fluoxetine, Effexor, duloxetine, Xanax, buspar (worsening anixety)  The patient demonstrates the following risk factors for suicide: Chronic risk factors for suicide include: psychiatric disorder of depressionand chronic pain. Acute risk factorsfor suicide include: family or marital conflict. Protective factorsfor this patient include: responsibility to others (children, family), coping skills and hope for the future. Considering these factors, the overall suicide risk at this point appears to be low. Patient isappropriate for outpatient follow up.   Norman Clay, MD 07/26/2018, 10:09 AM

## 2018-08-01 ENCOUNTER — Other Ambulatory Visit (INDEPENDENT_AMBULATORY_CARE_PROVIDER_SITE_OTHER): Payer: Self-pay | Admitting: Otolaryngology

## 2018-08-01 DIAGNOSIS — E041 Nontoxic single thyroid nodule: Secondary | ICD-10-CM

## 2018-08-02 ENCOUNTER — Ambulatory Visit (HOSPITAL_COMMUNITY): Payer: Self-pay | Admitting: Psychiatry

## 2018-08-03 ENCOUNTER — Ambulatory Visit (HOSPITAL_COMMUNITY): Payer: Self-pay | Admitting: Licensed Clinical Social Worker

## 2018-08-07 NOTE — Progress Notes (Deleted)
[place] MD/PA/NP OP Progress Note  08/07/2018 3:35 PM [name]  MRN:  [id number]  Chief Complaint:  HPI: *** Visit Diagnosis: No diagnosis found.  Past Psychiatric History: Please see initial evaluation for full details. I have reviewed the history. No updates at this time.     Past Medical History:  Past Medical History:  Diagnosis Date  . Depression   . Diabetes mellitus without complication ([place])   . Diverticulitis   . GERD (gastroesophageal reflux disease)   . Hypertension   . IBS (irritable bowel syndrome)   . Pneumonia   . Thyroid disease     Past Surgical History:  Procedure Laterality Date  . AGILE CAPSULE N/A 11/23/2016   Procedure: AGILE CAPSULE;  Surgeon: Rogene Houston, MD;  [facility];  Service: Endoscopy;  Laterality: N/A;  8:30  . BREAST EXCISIONAL BIOPSY Right   . CARPAL TUNNEL RELEASE    . CESAREAN SECTION    . CHOLECYSTECTOMY    . COLONOSCOPY N/A 08/28/2014   Procedure: COLONOSCOPY;  Surgeon: Rogene Houston, MD;  [facility];  Service: Endoscopy;  Laterality: N/A;  1030  . CYST REMOVAL HAND    . ESOPHAGOGASTRODUODENOSCOPY N/A 03/19/2016   Procedure: ESOPHAGOGASTRODUODENOSCOPY (EGD);  Surgeon: Rogene Houston, MD;  [facility];  Service: Endoscopy;  Laterality: N/A;  . FLEXIBLE SIGMOIDOSCOPY N/A 09/28/2017   Procedure: FLEXIBLE SIGMOIDOSCOPY;  Surgeon: Rogene Houston, MD;  [facility];  Service: Endoscopy;  Laterality: N/A;  . FRACTURE SURGERY     rt ankle   . GIVENS CAPSULE STUDY N/A 12/15/2016   Procedure: GIVENS CAPSULE STUDY;  Surgeon: Rogene Houston, MD;  [facility];  Service: Endoscopy;  Laterality: N/A;  . KNEE SURGERY     meniscus repair  . TUBAL LIGATION      Family Psychiatric History: Please see initial evaluation for full details. I have reviewed the history. No updates at this time.     Family History:  Family History  Problem Relation Age of Onset  . Cancer Mother       colon cancer age74  . Hypertension Mother   . Stroke Mother   . Diabetes Mother   . Non-Hodgkin's lymphoma Father   . Hypertension Father   . Diabetes Father   . Stroke Maternal Grandmother   . Kidney disease Paternal Aunt   . Cancer Paternal Aunt   . Kidney disease Paternal Uncle   . Cancer Paternal Uncle     Social History:  Social History   Socioeconomic History  . Marital status: Married    Spouse name: Not on file  . Number of children: Not on file  . Years of education: Not on file  . Highest education level: Not on file  Occupational History  . Not on file  Social Needs  . Financial resource strain: Not on file  . Food insecurity:    Worry: Not on file    Inability: Not on file  . Transportation needs:    Medical: Not on file    Non-medical: Not on file  Tobacco Use  . Smoking status: Never Smoker  . Smokeless tobacco: Never Used  Substance and Sexual Activity  . Alcohol use: No  . Drug use: No  . Sexual activity: Yes    Birth control/protection: Surgical  Lifestyle  . Physical activity:    Days per week: Not on file    Minutes per session: Not on file  .  Stress: Not on file  Relationships  . Social connections:    Talks on phone: Not on file    Gets together: Not on file    Attends religious service: Not on file    Active member of club or organization: Not on file    Attends meetings of clubs or organizations: Not on file    Relationship status: Not on file  Other Topics Concern  . Not on file  Social History Narrative   Works with special needs adults.    Allergies: No Known Allergies  Metabolic Disorder Labs: Lab Results  Component Value Date   HGBA1C 6.5 (H) 03/16/2016   MPG 140 03/16/2016   MPG 140 11/28/2015   No results found for: PROLACTIN No results found for: CHOL, TRIG, HDL, CHOLHDL, VLDL, LDLCALC Lab Results  Component Value Date   TSH 1.49 02/21/2018   TSH 5.142 (H) 09/27/2017    Therapeutic Level Labs: No results  found for: LITHIUM No results found for: VALPROATE No components found for:  CBMZ  Current Medications: Current Outpatient Medications  Medication Sig Dispense Refill  . acetaminophen (TYLENOL 8 HOUR ARTHRITIS PAIN) 650 MG CR tablet Take 1,300 mg by mouth every morning. *May take additional as needed    . albuterol (PROVENTIL HFA;VENTOLIN HFA) 108 (90 BASE) MCG/ACT inhaler Inhale 2 puffs into the lungs every 4 (four) hours as needed for wheezing or shortness of breath.    . ALPRAZolam (XANAX) 0.25 MG tablet Take 0.25 mg by mouth 2 (two) times daily as needed for anxiety.    . Ascorbic Acid (VITAMIN C) 1000 MG tablet Take 1,000 mg 2 (two) times daily by mouth.    Marland Kitchen aspirin EC 81 MG tablet Take 81 mg by mouth daily.    . ciprofloxacin (CIPRO) 500 MG tablet Take 1 tablet (500 mg total) by mouth 2 (two) times daily. 14 tablet 0  . Dulaglutide (TRULICITY) 3.66 YQ/0.3KV SOPN Inject 0.5 mLs into the skin every Monday. On  Monday's.     . DULoxetine (CYMBALTA) 60 MG capsule Take 1 capsule (60 mg total) by mouth 2 (two) times daily. 180 capsule 0  . glipiZIDE (GLUCOTROL) 5 MG tablet Take 5 mg by mouth 2 (two) times daily.     Marland Kitchen HYDROcodone-acetaminophen (NORCO/VICODIN) 5-325 MG tablet Take 1 tablet by mouth every 4 (four) hours as needed. 10 tablet 0  . levothyroxine (SYNTHROID, LEVOTHROID) 100 MCG tablet Take 100 mcg daily by mouth.   1  . lisinopril-hydrochlorothiazide (PRINZIDE,ZESTORETIC) 20-12.5 MG tablet Take 1 tablet daily by mouth. Resume in 3 days 30 tablet 11  . meclizine (ANTIVERT) 25 MG tablet Take 1 tablet (25 mg total) by mouth 3 (three) times daily as needed for dizziness. 30 tablet 0  . metoCLOPramide (REGLAN) 10 MG tablet Take 1 tablet (10 mg total) by mouth every 6 (six) hours as needed for nausea (or headache). 30 tablet 0  . metroNIDAZOLE (FLAGYL) 500 MG tablet Take 1 tablet (500 mg total) by mouth 2 (two) times daily. 14 tablet 0  . montelukast (SINGULAIR) 10 MG tablet Take 10 mg by  mouth at bedtime.    Marland Kitchen omeprazole (PRILOSEC OTC) 20 MG tablet Take 1 tablet (20 mg total) by mouth daily. (Patient taking differently: Take 20 mg by mouth every evening. ) 20 tablet 5  . ondansetron (ZOFRAN ODT) 4 MG disintegrating tablet Take 1 tablet (4 mg total) by mouth every 8 (eight) hours as needed for nausea or vomiting. 20 tablet 0  .  ondansetron (ZOFRAN) 4 MG tablet Take 1 tablet (4 mg total) by mouth every 6 (six) hours. 12 tablet 0  . oxycodone (OXY-IR) 5 MG capsule Take 1 capsule (5 mg total) by mouth every 4 (four) hours as needed. 12 capsule 0  . promethazine (PHENERGAN) 25 MG tablet Take 1 tablet (25 mg total) by mouth every 6 (six) hours as needed for nausea or vomiting. 30 tablet 0  . traZODone (DESYREL) 100 MG tablet Take 1 tablet (100 mg total) by mouth at bedtime as needed for sleep. p 90 tablet 0  . valACYclovir (VALTREX) 1000 MG tablet Take 2 g 2 (two) times daily by mouth.  2   No current facility-administered medications for this visit.      Musculoskeletal: Strength & Muscle Tone: within normal limits Gait & Station: normal Patient leans: N/A  Psychiatric Specialty Exam: ROS  Last menstrual period 03/27/2014.There is no height or weight on file to calculate BMI.  General Appearance: Fairly Groomed  Eye Contact:  Good  Speech:  Clear and Coherent  Volume:  Normal  Mood:  {BHH MOOD:22306}  Affect:  {Affect (PAA):22687}  Thought Process:  Coherent  Orientation:  Full (Time, Place, and Person)  Thought Content: Logical   Suicidal Thoughts:  {ST/HT (PAA):22692}  Homicidal Thoughts:  {ST/HT (PAA):22692}  Memory:  Immediate;   Good  Judgement:  {Judgement (PAA):22694}  Insight:  {Insight (PAA):22695}  Psychomotor Activity:  Normal  Concentration:  Concentration: Good and Attention Span: Good  Recall:  Good  Fund of Knowledge: Good  Language: Good  Akathisia:  No  Handed:  Right  AIMS (if indicated): not done  Assets:  Communication Skills Desire for  Improvement  ADL's:  Intact  Cognition: WNL  Sleep:  {BHH GOOD/FAIR/POOR:22877}   Screenings:   Assessment and Plan:  Kristin Bailey is a 57 y.o. year old female with a history of depression, hypothyroidism, hypertension, IBS,diabetes, sciatica  , who presents for follow up appointment for No diagnosis found.  # MDD, moderate, recurrent with anxious distress  There has been significant improvement in anxiety since the last appointment.  She could not tolerate BuSpar due to worsening anxiety.  Will continue duloxetine to target depression and anxiety.  Will continue trazodone as needed for insomnia.  Will continue Xanax as needed for anxiety.  Discussed behavioral activation.   Plan 1. Continueduloxetine 60 mg twice a day 2. Discontinue buspar 3.Continuetrazodone 100-150 mg at night as needed for sleep 4.Return to clinic in three months for 15 mins 5. Continue Xanax0.25 mg up to twice a day as needed for anxiety (she declines refill)  The patient demonstrates the following risk factors for suicide: Chronic risk factors for suicide include: psychiatric disorder of depressionand chronic pain. Acute risk factorsfor suicide include: family or marital conflict. Protective factorsfor this patient include: responsibility to others (children, family), coping skills and hope for the future. Considering these factors, the overall suicide risk at this point appears to be low. Patient isappropriate for outpatient follow up.  Past trials of medication: fluoxetine, Effexor, duloxetine, Xanax, buspar (worsening anixety)  Norman Clay, MD 08/07/2018, 3:35 PM

## 2018-08-13 ENCOUNTER — Ambulatory Visit (HOSPITAL_COMMUNITY): Payer: BLUE CROSS/BLUE SHIELD | Admitting: Psychiatry

## 2018-08-16 ENCOUNTER — Ambulatory Visit (HOSPITAL_COMMUNITY)
Admission: RE | Admit: 2018-08-16 | Discharge: 2018-08-16 | Disposition: A | Discharge status: Home / Self Care | Payer: BLUE CROSS/BLUE SHIELD | Source: Ambulatory Visit | Attending: Otolaryngology | Admitting: Otolaryngology

## 2018-08-16 DIAGNOSIS — E041 Nontoxic single thyroid nodule: Secondary | ICD-10-CM | POA: Insufficient documentation

## 2018-08-23 ENCOUNTER — Ambulatory Visit (INDEPENDENT_AMBULATORY_CARE_PROVIDER_SITE_OTHER): Payer: BLUE CROSS/BLUE SHIELD | Admitting: Otolaryngology

## 2018-08-23 DIAGNOSIS — D440 Neoplasm of uncertain behavior of thyroid gland: Secondary | ICD-10-CM | POA: Diagnosis not present

## 2018-08-27 ENCOUNTER — Other Ambulatory Visit (INDEPENDENT_AMBULATORY_CARE_PROVIDER_SITE_OTHER): Payer: Self-pay | Admitting: Otolaryngology

## 2018-08-27 DIAGNOSIS — E041 Nontoxic single thyroid nodule: Secondary | ICD-10-CM

## 2018-08-29 ENCOUNTER — Ambulatory Visit (HOSPITAL_COMMUNITY): Payer: Self-pay | Admitting: Licensed Clinical Social Worker

## 2018-09-17 ENCOUNTER — Emergency Department (HOSPITAL_COMMUNITY): Payer: Self-pay

## 2018-09-17 ENCOUNTER — Encounter (HOSPITAL_COMMUNITY): Payer: Self-pay | Admitting: Emergency Medicine

## 2018-09-17 ENCOUNTER — Telehealth (HOSPITAL_COMMUNITY): Payer: Self-pay | Admitting: Psychiatry

## 2018-09-17 ENCOUNTER — Other Ambulatory Visit: Payer: Self-pay

## 2018-09-17 ENCOUNTER — Emergency Department (HOSPITAL_COMMUNITY)
Admission: EM | Admit: 2018-09-17 | Discharge: 2018-09-17 | Disposition: A | Discharge status: Home / Self Care | Payer: Self-pay | Attending: Emergency Medicine | Admitting: Emergency Medicine

## 2018-09-17 DIAGNOSIS — R079 Chest pain, unspecified: Secondary | ICD-10-CM | POA: Insufficient documentation

## 2018-09-17 DIAGNOSIS — Z5321 Procedure and treatment not carried out due to patient leaving prior to being seen by health care provider: Secondary | ICD-10-CM | POA: Insufficient documentation

## 2018-09-17 LAB — CBC
HEMATOCRIT: 47.2 % — AB (ref 36.0–46.0)
HEMOGLOBIN: 15.1 g/dL — AB (ref 12.0–15.0)
MCH: 27.3 pg (ref 26.0–34.0)
MCHC: 32 g/dL (ref 30.0–36.0)
MCV: 85.2 fL (ref 80.0–100.0)
Platelets: 398 10*3/uL (ref 150–400)
RBC: 5.54 MIL/uL — AB (ref 3.87–5.11)
RDW: 13.9 % (ref 11.5–15.5)
WBC: 16.3 10*3/uL — ABNORMAL HIGH (ref 4.0–10.5)
nRBC: 0 % (ref 0.0–0.2)

## 2018-09-17 LAB — BASIC METABOLIC PANEL
Anion gap: 13 (ref 5–15)
BUN: 21 mg/dL — AB (ref 6–20)
CHLORIDE: 100 mmol/L (ref 98–111)
CO2: 22 mmol/L (ref 22–32)
Calcium: 9.1 mg/dL (ref 8.9–10.3)
Creatinine, Ser: 0.81 mg/dL (ref 0.44–1.00)
GFR calc Af Amer: >60 mL/min (ref >60)
GLUCOSE: 304 mg/dL — AB (ref 70–99)
POTASSIUM: 4.1 mmol/L (ref 3.5–5.1)
Sodium: 135 mmol/L (ref 135–145)

## 2018-09-17 MED ORDER — TRAZODONE HCL 100 MG PO TABS: 100.0000 mg | ORAL_TABLET | Freq: Every evening | ORAL | 0 refills | Status: DC | PRN

## 2018-09-17 NOTE — Progress Notes (Signed)
[facility] [employer] MD/PA/NP OP Progress Note  09/19/2018 9:06 AM SAIRA KRAMME  MRN:  [id number]  Chief Complaint:  Chief Complaint    Depression; Follow-up     HPI:  Patient presents late for follow-up appointment for depression.  She states that she feels nauseated over a couple of days.  Otherwise, she feels good.  She has less anxious and less depressed.  She has not been able to take a walk as she wishes due to her knee pain.  She has insomnia due to nocturia.  She has fair energy and motivation.  She denies SI.  She denies panic attacks.  She takes Xanax 2 tablets/week for anxiety.  Discussed no-show policy.    Wt Readings from Last 3 Encounters:  09/19/18 268 lb (121.6 kg)  09/18/18 276 lb (125.2 kg)  09/17/18 276 lb (125.2 kg)   05/02/18 270 lb (122.5 kg)     Visit Diagnosis:    ICD-10-CM   1. MDD (major depressive disorder), recurrent episode, mild ([place]) F33.0     Past Psychiatric History: Please see initial evaluation for full details. I have reviewed the history. No updates at this time.     Past Medical History:  Past Medical History:  Diagnosis Date  . Depression   . Diabetes mellitus without complication ([place])   . Diverticulitis   . GERD (gastroesophageal reflux disease)   . Hypertension   . IBS (irritable bowel syndrome)   . Pneumonia   . Thyroid disease     Past Surgical History:  Procedure Laterality Date  . AGILE CAPSULE N/A 11/23/2016   Procedure: AGILE CAPSULE;  Surgeon: Rogene Houston, MD;  [facility];  Service: Endoscopy;  Laterality: N/A;  8:30  . BREAST EXCISIONAL BIOPSY Right   . CARPAL TUNNEL RELEASE    . CESAREAN SECTION    . CHOLECYSTECTOMY    . COLONOSCOPY N/A 08/28/2014   Procedure: COLONOSCOPY;  Surgeon: Rogene Houston, MD;  [facility];  Service: Endoscopy;  Laterality: N/A;  1030  . CYST REMOVAL HAND    . ESOPHAGOGASTRODUODENOSCOPY N/A 03/19/2016   Procedure: ESOPHAGOGASTRODUODENOSCOPY (EGD);  Surgeon: Rogene Houston,  MD;  [facility];  Service: Endoscopy;  Laterality: N/A;  . FLEXIBLE SIGMOIDOSCOPY N/A 09/28/2017   Procedure: FLEXIBLE SIGMOIDOSCOPY;  Surgeon: Rogene Houston, MD;  [facility];  Service: Endoscopy;  Laterality: N/A;  . FRACTURE SURGERY     rt ankle   . GIVENS CAPSULE STUDY N/A 12/15/2016   Procedure: GIVENS CAPSULE STUDY;  Surgeon: Rogene Houston, MD;  [facility];  Service: Endoscopy;  Laterality: N/A;  . KNEE SURGERY     meniscus repair  . TUBAL LIGATION      Family Psychiatric History: Please see initial evaluation for full details. I have reviewed the history. No updates at this time.     Family History:  Family History  Problem Relation Age of Onset  . Cancer Mother        colon cancer age74  . Hypertension Mother   . Stroke Mother   . Diabetes Mother   . Non-Hodgkin's lymphoma Father   . Hypertension Father   . Diabetes Father   . Stroke Maternal Grandmother   . Kidney disease Paternal Aunt   . Cancer Paternal Aunt   . Kidney disease Paternal Uncle   . Cancer Paternal Uncle     Social History:  Social History   Socioeconomic History  . Marital status:  Married    Spouse name: Not on file  . Number of children: Not on file  . Years of education: Not on file  . Highest education level: Not on file  Occupational History  . Not on file  Social Needs  . Financial resource strain: Not on file  . Food insecurity:    Worry: Not on file    Inability: Not on file  . Transportation needs:    Medical: Not on file    Non-medical: Not on file  Tobacco Use  . Smoking status: Never Smoker  . Smokeless tobacco: Never Used  Substance and Sexual Activity  . Alcohol use: No  . Drug use: No  . Sexual activity: Yes    Birth control/protection: Surgical  Lifestyle  . Physical activity:    Days per week: Not on file    Minutes per session: Not on file  . Stress: Not on file  Relationships  . Social connections:    Talks on phone:  Not on file    Gets together: Not on file    Attends religious service: Not on file    Active member of club or organization: Not on file    Attends meetings of clubs or organizations: Not on file    Relationship status: Not on file  Other Topics Concern  . Not on file  Social History Narrative   Works with special needs adults.    Allergies: No Known Allergies  Metabolic Disorder Labs: Lab Results  Component Value Date   HGBA1C 6.5 (H) 03/16/2016   MPG 140 03/16/2016   MPG 140 11/28/2015   No results found for: PROLACTIN No results found for: CHOL, TRIG, HDL, CHOLHDL, VLDL, LDLCALC Lab Results  Component Value Date   TSH 1.49 02/21/2018   TSH 5.142 (H) 09/27/2017    Therapeutic Level Labs: No results found for: LITHIUM No results found for: VALPROATE No components found for:  CBMZ  Current Medications: Current Outpatient Medications  Medication Sig Dispense Refill  . acetaminophen (TYLENOL 8 HOUR ARTHRITIS PAIN) 650 MG CR tablet Take 1,300 mg by mouth every morning. *May take additional as needed    . albuterol (PROVENTIL HFA;VENTOLIN HFA) 108 (90 BASE) MCG/ACT inhaler Inhale 2 puffs into the lungs every 4 (four) hours as needed for wheezing or shortness of breath.    . ALPRAZolam (XANAX) 0.25 MG tablet Take 1 tablet (0.25 mg total) by mouth 2 (two) times daily as needed for anxiety. 30 tablet 0  . Ascorbic Acid (VITAMIN C) 1000 MG tablet Take 1,000 mg 2 (two) times daily by mouth.    Marland Kitchen aspirin EC 81 MG tablet Take 81 mg by mouth daily.    . ciprofloxacin (CIPRO) 500 MG tablet Take 1 tablet (500 mg total) by mouth 2 (two) times daily. 14 tablet 0  . Dulaglutide (TRULICITY) 9.51 OA/4.1YS SOPN Inject 0.5 mLs into the skin every Monday. On  Monday's.     . DULoxetine (CYMBALTA) 60 MG capsule Take 1 capsule (60 mg total) by mouth 2 (two) times daily. 180 capsule 0  . glipiZIDE (GLUCOTROL) 5 MG tablet Take 5 mg by mouth 2 (two) times daily.     Marland Kitchen HYDROcodone-acetaminophen  (NORCO/VICODIN) 5-325 MG tablet Take 1 tablet by mouth every 4 (four) hours as needed. 10 tablet 0  . levothyroxine (SYNTHROID, LEVOTHROID) 100 MCG tablet Take 100 mcg daily by mouth.   1  . lisinopril-hydrochlorothiazide (PRINZIDE,ZESTORETIC) 20-12.5 MG tablet Take 1 tablet daily by mouth. Resume in  3 days 30 tablet 11  . meclizine (ANTIVERT) 25 MG tablet Take 1 tablet (25 mg total) by mouth 3 (three) times daily as needed for dizziness. 30 tablet 0  . metoCLOPramide (REGLAN) 10 MG tablet Take 1 tablet (10 mg total) by mouth every 6 (six) hours as needed for nausea (or headache). 30 tablet 0  . metroNIDAZOLE (FLAGYL) 500 MG tablet Take 1 tablet (500 mg total) by mouth 2 (two) times daily. 14 tablet 0  . montelukast (SINGULAIR) 10 MG tablet Take 10 mg by mouth at bedtime.    Marland Kitchen omeprazole (PRILOSEC OTC) 20 MG tablet Take 1 tablet (20 mg total) by mouth daily. (Patient taking differently: Take 20 mg by mouth every evening. ) 20 tablet 5  . ondansetron (ZOFRAN ODT) 4 MG disintegrating tablet Take 1 tablet (4 mg total) by mouth every 8 (eight) hours as needed for nausea or vomiting. 20 tablet 0  . ondansetron (ZOFRAN) 4 MG tablet Take 1 tablet (4 mg total) by mouth every 6 (six) hours. 12 tablet 0  . oxycodone (OXY-IR) 5 MG capsule Take 1 capsule (5 mg total) by mouth every 4 (four) hours as needed. 12 capsule 0  . promethazine (PHENERGAN) 25 MG tablet Take 1 tablet (25 mg total) by mouth every 6 (six) hours as needed for nausea or vomiting. 30 tablet 0  . traZODone (DESYREL) 100 MG tablet Take 1 tablet (100 mg total) by mouth at bedtime as needed for sleep. p 90 tablet 0  . valACYclovir (VALTREX) 1000 MG tablet Take 2 g 2 (two) times daily by mouth.  2   No current facility-administered medications for this visit.      Musculoskeletal: Strength & Muscle Tone: within normal limits Gait & Station: normal Patient leans: N/A  Psychiatric Specialty Exam: Review of Systems  Psychiatric/Behavioral:  Positive for depression. Negative for hallucinations, memory loss, substance abuse and suicidal ideas. The patient is nervous/anxious and has insomnia.   All other systems reviewed and are negative.   Blood pressure 140/84, pulse (!) 104, height 5\' 6"  (1.676 m), weight 268 lb (121.6 kg), last menstrual period 03/27/2014, SpO2 98 %.Body mass index is 43.26 kg/m.  General Appearance: Fairly Groomed  Eye Contact:  Good  Speech:  Clear and Coherent  Volume:  Normal  Mood:  "good"  Affect:  Appropriate, Congruent and calm  Thought Process:  Coherent  Orientation:  Full (Time, Place, and Person)  Thought Content: Logical   Suicidal Thoughts:  No  Homicidal Thoughts:  No  Memory:  Immediate;   Good  Judgement:  Good  Insight:  Fair  Psychomotor Activity:  Normal  Concentration:  Concentration: Good and Attention Span: Good  Recall:  Good  Fund of Knowledge: Good  Language: Good  Akathisia:  No  Handed:  Right  AIMS (if indicated): not done  Assets:  Communication Skills Desire for Improvement  ADL's:  Intact  Cognition: WNL  Sleep:  Poor   Screenings:   Assessment and Plan:  CARI BURGO is a 57 y.o. year old female with a history of depression,,hypothyroidism, hypertension, IBS,diabetes, sciatica   , who presents for follow up appointment for MDD (major depressive disorder), recurrent episode, mild ([place])  # MDD, mild, recurrent with anxious distress Patient reports overall improvement in depressive symptoms and anxiety since the last visit.  Will continue duloxetine to target depression.  Will continue trazodone as needed for insomnia.  Will continue Xanax as needed for anxiety.  Discussed behavioral activation.  Plan 1. Continueduloxetine 60 mg twice a day 3.Continuetrazodone 100-150 mg at night as needed for sleep 4.Return to clinic in three months for 15 mins 5. Continue Xanax0.25 mg up to twice a day as needed for anxiety (she takes two tabs per week)  Past  trials of medication: fluoxetine, Effexor, duloxetine, buspar (unable to tolerate), Xanax,   The patient demonstrates the following risk factors for suicide: Chronic risk factors for suicide include: psychiatric disorder of depressionand chronic pain. Acute risk factorsfor suicide include: family or marital conflict. Protective factorsfor this patient include: responsibility to others (children, family), coping skills and hope for the future. Considering these factors, the overall suicide risk at this point appears to be low. Patient isappropriate for outpatient follow up.   Norman Clay, MD 09/19/2018, 9:06 AM

## 2018-09-17 NOTE — ED Triage Notes (Signed)
Patient reports right sided chest pain with SOB and diaphoresis after lunch today. Patient states she has had cough and congestion for over 2 weeks.

## 2018-09-17 NOTE — Telephone Encounter (Signed)
Ordered refill for Trazodone per request. Please contact the patient to make follow up appointment, and notify of no show policy.

## 2018-09-17 NOTE — ED Notes (Signed)
Patient informed registration she was leaving

## 2018-09-17 NOTE — ED Triage Notes (Signed)
Patient states she has had cold feet but "been burning up."

## 2018-09-18 ENCOUNTER — Other Ambulatory Visit: Payer: Self-pay

## 2018-09-18 ENCOUNTER — Encounter (HOSPITAL_COMMUNITY): Payer: Self-pay | Admitting: Emergency Medicine

## 2018-09-18 ENCOUNTER — Emergency Department (HOSPITAL_COMMUNITY): Payer: Self-pay

## 2018-09-18 ENCOUNTER — Emergency Department (HOSPITAL_COMMUNITY)
Admission: EM | Admit: 2018-09-18 | Discharge: 2018-09-18 | Disposition: A | Discharge status: Home / Self Care | Payer: Self-pay | Attending: Emergency Medicine | Admitting: Emergency Medicine

## 2018-09-18 DIAGNOSIS — Z7984 Long term (current) use of oral hypoglycemic drugs: Secondary | ICD-10-CM | POA: Insufficient documentation

## 2018-09-18 DIAGNOSIS — Z7982 Long term (current) use of aspirin: Secondary | ICD-10-CM | POA: Insufficient documentation

## 2018-09-18 DIAGNOSIS — E119 Type 2 diabetes mellitus without complications: Secondary | ICD-10-CM | POA: Insufficient documentation

## 2018-09-18 DIAGNOSIS — Z79899 Other long term (current) drug therapy: Secondary | ICD-10-CM | POA: Insufficient documentation

## 2018-09-18 DIAGNOSIS — R0789 Other chest pain: Secondary | ICD-10-CM | POA: Insufficient documentation

## 2018-09-18 DIAGNOSIS — I10 Essential (primary) hypertension: Secondary | ICD-10-CM | POA: Insufficient documentation

## 2018-09-18 LAB — I-STAT TROPONIN, ED: TROPONIN I, POC: 0.01 ng/mL (ref 0.00–0.08)

## 2018-09-18 MED ORDER — ONDANSETRON 8 MG PO TBDP
8.0000 mg | ORAL_TABLET | Freq: Once | ORAL | Status: AC
  Administered 2018-09-18: 8 mg via ORAL
  Filled 2018-09-18: qty 1

## 2018-09-18 NOTE — ED Triage Notes (Signed)
Pt states she is N/, fatigue, and "the crud". Left without being seen yesterday.

## 2018-09-18 NOTE — Discharge Instructions (Addendum)
Chest pain is likely related to your recent viral illness.  Her work-up did not show any signs or chest pain is related to your heart.  Her symptoms should improve over the next 1-2 weeks.

## 2018-09-18 NOTE — ED Provider Notes (Signed)
[facility] EMERGENCY DEPARTMENT Provider Note   CSN: [id number] Arrival date & time: 09/18/18  1337     History   Chief Complaint Chief Complaint  Patient presents with  . Nausea    HPI Kristin Bailey is a 57 y.o. female.  Patient is a 57 year old female presenting with multiple complaints including 2 weeks of right symptoms, nausea, and intermitent substernal chest pain.  PMH significant for NIDDM, HTN, hypothyroidism, GERD, IBS, MDD, obesity.  Patient reports 2 weeks of "feeling like she has the crud."  She reports a history of scratchy throat and now has nasal congestion which she is taking Mucinex for.  She came to any pain ED yesterday to be evaluated for multiple complaints including chills without fever, nausea without vomiting, and substernal chest pressure with "occasional sharp pain sensation" without associated shortness of breath.  She reports that her husband has been ill with a viral URI earlier in the week.  She tried to call her PCP earlier but was told she cannot get in until Thursday vomiting her to come to the ED which she left after 1 hour due to her "anxiety flaring up."  She did receive blood work with leukocytosis which is at her baseline concerning her chronic granulocytosis.  BMET was unremarkable.  Patient does have diabetes and is taking Trulicity but has discontinued medication over the last 1.5 weeks due to "losing her insurance."  She currently works and lives with her husband at home here in [place].     Past Medical History:  Diagnosis Date  . Depression   . Diabetes mellitus without complication ([place])   . Diverticulitis   . GERD (gastroesophageal reflux disease)   . Hypertension   . IBS (irritable bowel syndrome)   . Pneumonia   . Thyroid disease     Patient Active Problem List   Diagnosis Date Noted  . Abdominal pain, left lower quadrant   . Diverticulitis 09/26/2017  . Hyponatremia 09/26/2017  . Noninfectious gastroenteritis  11/28/2016  . Enteritis 11/04/2016  . Lactic acidosis 11/04/2016  . Enterocolitis 03/17/2016  . Gastroenteritis, infectious 03/17/2016  . Leukocytosis 11/28/2015  . Acute respiratory failure with hypoxia ([place]) 11/28/2015  . Controlled type 2 diabetes mellitus without complication, without long-term current use of insulin ([place]) 11/28/2015  . UTI (lower urinary tract infection) 11/27/2015  . Colitis 11/27/2015  . AKI (acute kidney injury) ([place]) 11/27/2015    Past Surgical History:  Procedure Laterality Date  . AGILE CAPSULE N/A 11/23/2016   Procedure: AGILE CAPSULE;  Surgeon: Rogene Houston, MD;  Location: [facility] ENDO SUITE;  Service: Endoscopy;  Laterality: N/A;  8:30  . BREAST EXCISIONAL BIOPSY Right   . CARPAL TUNNEL RELEASE    . CESAREAN SECTION    . CHOLECYSTECTOMY    . COLONOSCOPY N/A 08/28/2014   Procedure: COLONOSCOPY;  Surgeon: Rogene Houston, MD;  Location: [facility] ENDO SUITE;  Service: Endoscopy;  Laterality: N/A;  1030  . CYST REMOVAL HAND    . ESOPHAGOGASTRODUODENOSCOPY N/A 03/19/2016   Procedure: ESOPHAGOGASTRODUODENOSCOPY (EGD);  Surgeon: Rogene Houston, MD;  Location: [facility] ENDO SUITE;  Service: Endoscopy;  Laterality: N/A;  . FLEXIBLE SIGMOIDOSCOPY N/A 09/28/2017   Procedure: FLEXIBLE SIGMOIDOSCOPY;  Surgeon: Rogene Houston, MD;  Location: [facility] ENDO SUITE;  Service: Endoscopy;  Laterality: N/A;  . FRACTURE SURGERY     rt ankle   . GIVENS CAPSULE STUDY N/A 12/15/2016   Procedure: GIVENS CAPSULE STUDY;  Surgeon: Rogene Houston, MD;  Location: [facility]  ENDO SUITE;  Service: Endoscopy;  Laterality: N/A;  . KNEE SURGERY     meniscus repair  . TUBAL LIGATION       OB History    Gravida  1   Para  1   Term  1   Preterm      AB      Living  1     SAB      TAB      Ectopic      Multiple      Live Births               Home Medications    Prior to Admission medications   Medication Sig Start Date End Date Taking? Authorizing Provider  acetaminophen (TYLENOL 8  HOUR ARTHRITIS PAIN) 650 MG CR tablet Take 1,300 mg by mouth every morning. *May take additional as needed    [provider]  albuterol (PROVENTIL HFA;VENTOLIN HFA) 108 (90 BASE) MCG/ACT inhaler Inhale 2 puffs into the lungs every 4 (four) hours as needed for wheezing or shortness of breath.    [provider]  ALPRAZolam Duanne Moron) 0.25 MG tablet Take 0.25 mg by mouth 2 (two) times daily as needed for anxiety.    [provider]  Ascorbic Acid (VITAMIN C) 1000 MG tablet Take 1,000 mg 2 (two) times daily by mouth.    [provider]  aspirin EC 81 MG tablet Take 81 mg by mouth daily.    [provider]  ciprofloxacin (CIPRO) 500 MG tablet Take 1 tablet (500 mg total) by mouth 2 (two) times daily. 03/22/18   Ashley Murrain, NP  Dulaglutide (TRULICITY) 0.93 GH/8.2XH SOPN Inject 0.5 mLs into the skin every Monday. On  Monday's.     [provider]  DULoxetine (CYMBALTA) 60 MG capsule Take 1 capsule (60 mg total) by mouth 2 (two) times daily. 05/02/18   Norman Clay, MD  glipiZIDE (GLUCOTROL) 5 MG tablet Take 5 mg by mouth 2 (two) times daily.     [provider]  HYDROcodone-acetaminophen (NORCO/VICODIN) 5-325 MG tablet Take 1 tablet by mouth every 4 (four) hours as needed. 09/07/17   Tanna Furry, MD  levothyroxine (SYNTHROID, LEVOTHROID) 100 MCG tablet Take 100 mcg daily by mouth.  08/16/17   [provider]  lisinopril-hydrochlorothiazide (PRINZIDE,ZESTORETIC) 20-12.5 MG tablet Take 1 tablet daily by mouth. Resume in 3 days 09/29/17 09/23/18  Kathie Dike, MD  meclizine (ANTIVERT) 25 MG tablet Take 1 tablet (25 mg total) by mouth 3 (three) times daily as needed for dizziness. 07/11/18   Julianne Rice, MD  metoCLOPramide (REGLAN) 10 MG tablet Take 1 tablet (10 mg total) by mouth every 6 (six) hours as needed for nausea (or headache). 37/16/96   Delora Fuel, MD  metroNIDAZOLE (FLAGYL) 500 MG tablet Take 1 tablet (500 mg total) by  mouth 2 (two) times daily. 03/22/18   Ashley Murrain, NP  montelukast (SINGULAIR) 10 MG tablet Take 10 mg by mouth at bedtime.    [provider]  omeprazole (PRILOSEC OTC) 20 MG tablet Take 1 tablet (20 mg total) by mouth daily. Patient taking differently: Take 20 mg by mouth every evening.  01/03/18   Rehman, Mechele Dawley, MD  ondansetron (ZOFRAN ODT) 4 MG disintegrating tablet Take 1 tablet (4 mg total) by mouth every 8 (eight) hours as needed for nausea or vomiting. 02/22/18   Long, Wonda Olds, MD  ondansetron (ZOFRAN) 4 MG tablet Take 1 tablet (4 mg total) by  mouth every 6 (six) hours. 07/11/18   Julianne Rice, MD  oxycodone (OXY-IR) 5 MG capsule Take 1 capsule (5 mg total) by mouth every 4 (four) hours as needed. 03/47/42   Delora Fuel, MD  promethazine (PHENERGAN) 25 MG tablet Take 1 tablet (25 mg total) by mouth every 6 (six) hours as needed for nausea or vomiting. 03/22/18   Ashley Murrain, NP  traZODone (DESYREL) 100 MG tablet Take 1 tablet (100 mg total) by mouth at bedtime as needed for sleep. p 09/17/18   Norman Clay, MD  valACYclovir (VALTREX) 1000 MG tablet Take 2 g 2 (two) times daily by mouth. 09/23/17   [provider]    Family History Family History  Problem Relation Age of Onset  . Cancer Mother        colon cancer age74  . Hypertension Mother   . Stroke Mother   . Diabetes Mother   . Non-Hodgkin's lymphoma Father   . Hypertension Father   . Diabetes Father   . Stroke Maternal Grandmother   . Kidney disease Paternal Aunt   . Cancer Paternal Aunt   . Kidney disease Paternal Uncle   . Cancer Paternal Uncle     Social History Social History   Tobacco Use  . Smoking status: Never Smoker  . Smokeless tobacco: Never Used  Substance Use Topics  . Alcohol use: No  . Drug use: No     Allergies   Patient has no known allergies.   Review of Systems Review of Systems  Constitutional: Positive for chills, diaphoresis and fatigue. Negative for fever.    HENT: Positive for congestion, postnasal drip and sneezing.   Eyes: Negative for pain and visual disturbance.  Respiratory: Positive for cough and shortness of breath.   Cardiovascular: Positive for chest pain. Negative for leg swelling.  Gastrointestinal: Positive for nausea. Negative for abdominal pain, diarrhea and vomiting.  Genitourinary: Negative for dysuria, frequency and hematuria.  Musculoskeletal: Negative for arthralgias and myalgias.  Skin: Negative for rash and wound.  Neurological: Positive for dizziness. Negative for headaches.  Psychiatric/Behavioral: Negative for confusion. The patient is nervous/anxious.      Physical Exam Updated Vital Signs BP 114/81 (BP Location: Right Arm)   Pulse 89   Temp 98 F (36.7 C) (Tympanic)   Resp 18   Ht 5\' 6"  (1.676 m)   Wt 125.2 kg   LMP 03/27/2014 Comment: irregular  SpO2 94%   BMI 44.55 kg/m   Physical Exam  Constitutional: She appears well-developed and well-nourished. No distress.  HENT:  Head: Normocephalic and atraumatic.  Mouth/Throat: No oropharyngeal exudate.  Eyes: Conjunctivae are normal.  Neck: Neck supple. No tracheal deviation present.  Cardiovascular: Normal rate and regular rhythm.  No murmur heard. Sternum mildly tender with light palpation  Pulmonary/Chest: Effort normal and breath sounds normal. No respiratory distress. She has no wheezes. She has no rales.  Abdominal: Soft. She exhibits no mass. There is no tenderness.  Musculoskeletal: She exhibits no edema.  Lymphadenopathy:    She has no cervical adenopathy.  Neurological: She is alert.  Skin: Skin is warm and dry. No rash noted. She is not diaphoretic.  Psychiatric: She has a normal mood and affect.  Nursing note and vitals reviewed.    ED Treatments / Results  Labs (all labs ordered are listed, but only abnormal results are displayed) Labs Reviewed  I-STAT TROPONIN, ED    EKG None  Radiology Dg Chest [facility] 1 View  Result Date:  09/18/2018 CLINICAL DATA:  Chest pain. EXAM: PORTABLE CHEST 1 VIEW COMPARISON:  Radiographs of February 22, 2018. FINDINGS: The heart size and mediastinal contours are within normal limits. Both lungs are clear. No pneumothorax or pleural effusion is noted. The visualized skeletal structures are unremarkable. IMPRESSION: No active disease. Electronically Signed   By: Marijo Conception, M.D.   On: 09/18/2018 15:03    Procedures Procedures (including critical care time)  Medications Ordered in ED Medications  ondansetron (ZOFRAN-ODT) disintegrating tablet 8 mg (8 mg Oral Given 09/18/18 1439)     Initial Impression / Assessment and Plan / ED Course  I have reviewed the triage vital signs and the nursing notes.  Pertinent labs & imaging results that were available during my care of the patient were reviewed by me and considered in my medical decision making (see chart for details).  Patient is a 57 year old female presenting with multiple complaints including 2 weeks of right symptoms, nausea, and intermitent substernal chest pain.  PMH significant for NIDDM, HTN, hypothyroidism, GERD, IBS, MDD, obesity.  Presenting with atypical chest pain which is substernal area no associated shortness of breath or other symptoms concerning for cardiac etiology.  Will work-up for ACS.  Score 1.5 for tachycardia making PE unlikely.  Heart score 3.  Per chart review, patient does have a Myoview back on April 2018 with EF 76 percent without ischemia or scar, low risk study.  EKG 12-lead sinus tachycardia 107, QTc 426, no ST changes or T wave abnormalities.  Troponin negative.  CXR without acute cardiopulmonary findings.  Repeat vitals with resolved sinus tachycardia at 89, BP 114/81.  Symptoms likely related to underlying viral illness and unlikely cardiac in nature.  Patient does have coronary disease and may benefit from further outpatient work-up.  Reviewed return precautions.  Patient has appointment scheduled on  05/20/2018 with PCP.  Final Clinical Impressions(s) / ED Diagnoses   Final diagnoses:  Atypical chest pain    ED Discharge Orders    None       Stevinson Bing, DO 09/18/18 1531    Elnora Morrison, MD 09/22/18 [id number]

## 2018-09-18 NOTE — ED Notes (Signed)
Pt aware of wbc count, says she saw it on My Chart.  Pt says she has already called her doctor and is waiting for them to call her back with an appt.

## 2018-09-19 ENCOUNTER — Ambulatory Visit (INDEPENDENT_AMBULATORY_CARE_PROVIDER_SITE_OTHER): Payer: Self-pay | Admitting: Psychiatry

## 2018-09-19 ENCOUNTER — Ambulatory Visit (HOSPITAL_COMMUNITY): Payer: BLUE CROSS/BLUE SHIELD | Admitting: Psychiatry

## 2018-09-19 ENCOUNTER — Encounter (HOSPITAL_COMMUNITY): Payer: Self-pay | Admitting: Psychiatry

## 2018-09-19 ENCOUNTER — Ambulatory Visit (HOSPITAL_COMMUNITY): Payer: Self-pay | Admitting: Licensed Clinical Social Worker

## 2018-09-19 VITALS — BP 140/84 | HR 104 | Ht 66.0 in | Wt 268.0 lb

## 2018-09-19 DIAGNOSIS — F330 Major depressive disorder, recurrent, mild: Secondary | ICD-10-CM | POA: Insufficient documentation

## 2018-09-19 MED ORDER — ALPRAZOLAM 0.25 MG PO TABS: 0.2500 mg | ORAL_TABLET | Freq: Two times a day (BID) | ORAL | 0 refills | Status: DC | PRN

## 2018-09-19 MED ORDER — DULOXETINE HCL 60 MG PO CPEP: 60.0000 mg | ORAL_CAPSULE | Freq: Two times a day (BID) | ORAL | 0 refills | Status: DC

## 2018-09-19 NOTE — Patient Instructions (Signed)
1. Continueduloxetine 60 mg twice a day 3.Continuetrazodone 100-150 mg at night as needed for sleep 4.Return to clinic in three months for 15 mins

## 2018-09-20 ENCOUNTER — Encounter (HOSPITAL_COMMUNITY): Payer: Self-pay

## 2018-09-20 ENCOUNTER — Ambulatory Visit (HOSPITAL_COMMUNITY): Payer: BLUE CROSS/BLUE SHIELD

## 2018-10-17 ENCOUNTER — Ambulatory Visit (HOSPITAL_COMMUNITY): Payer: Self-pay | Admitting: Licensed Clinical Social Worker

## 2018-10-26 ENCOUNTER — Telehealth (HOSPITAL_COMMUNITY): Payer: Self-pay | Admitting: Psychiatry

## 2018-10-26 NOTE — Telephone Encounter (Signed)
Received refill request of xanax. Could you contact the patient if she needs the refill until the next appointment? I recall that she takes only a few tablets per week- so I only prescribed 30 tabs at her last visit.

## 2018-10-26 NOTE — Telephone Encounter (Signed)
LVM PER provider to inform & ask : Received refill request of xanax. Contact the patient if she needs the refill until the next appointment? I recall that she takes only a few tablets per week- so I only prescribed 30 tabs at her last visit.

## 2018-11-12 ENCOUNTER — Telehealth (HOSPITAL_COMMUNITY): Payer: Self-pay | Admitting: *Deleted

## 2018-11-12 NOTE — Telephone Encounter (Signed)
[facility] PATIENT ASSISTANCE PROGRAM   PATIENT MEETS PROGRAM ELIGIBILITY REQUIREMENTS & IS ENROLLED IN [facility] FOR A 12 MONTH PERIOD

## 2018-12-07 ENCOUNTER — Telehealth (HOSPITAL_COMMUNITY): Payer: Self-pay | Admitting: *Deleted

## 2018-12-07 ENCOUNTER — Other Ambulatory Visit (HOSPITAL_COMMUNITY): Payer: Self-pay | Admitting: Psychiatry

## 2018-12-07 MED ORDER — ALPRAZOLAM 0.25 MG PO TABS: 0.2500 mg | ORAL_TABLET | Freq: Two times a day (BID) | ORAL | 0 refills | Status: DC | PRN

## 2018-12-07 NOTE — Progress Notes (Signed)
[facility] MD/PA/NP OP Progress Note  12/10/2018 8:58 AM Kristin Bailey  MRN:  [id number]  Chief Complaint:  Chief Complaint    Anxiety; Depression; Follow-up     HPI:  Patient presents for follow-up appointment for depression.  She states that she has been doing very well until this weekend when she had a panic attack.  She attributes that to her eating unhealthy food.  She visit her family on weekends, and there was a surprise branch for the patient.  She ate cakes.  Although she enjoyed being with them, she feels fatigue today.  She denies any other significant mood issues.  She sleeps better with higher dose of trazodone.  She has fair concentration.  She has good motivation.  She denies SI.  She feels anxious and tense at times, and takes Xanax occasionally for anxiety.    Xanax filled on 12/07/2018   Visit Diagnosis:    ICD-10-CM   1. MDD (major depressive disorder), recurrent, in partial remission ([place]) F33.41     Past Psychiatric History: Please see initial evaluation for full details. I have reviewed the history. No updates at this time.     Past Medical History:  Past Medical History:  Diagnosis Date  . Depression   . Diabetes mellitus without complication ([place])   . Diverticulitis   . GERD (gastroesophageal reflux disease)   . Hypertension   . IBS (irritable bowel syndrome)   . Pneumonia   . Thyroid disease     Past Surgical History:  Procedure Laterality Date  . AGILE CAPSULE N/A 11/23/2016   Procedure: AGILE CAPSULE;  Surgeon: Rogene Houston, MD;  Location: [facility] ENDO SU[facility];  Service: Endoscopy;  Laterality: N/A;  8:30  . BREAST EXCISIONAL BIOPSY Right   . CARPAL TUNNEL RELEASE    . CESAREAN SECTION    . CHOLECYSTECTOMY    . COLONOSCOPY N/A 08/28/2014   Procedure: COLONOSCOPY;  Surgeon: Rogene Houston, MD;  Location: [facility];  Service: Endoscopy;  Laterality: N/A;  1030  . CYST REMOVAL HAND    . ESOPHAGOGASTRODUODENOSCOPY N/A 03/19/2016   Procedure:  ESOPHAGOGASTRODUODENOSCOPY (EGD);  Surgeon: Rogene Houston, MD;  Location: [facility] END[facility] SU[facility];  Service: Endoscopy;  Laterality: N/A;  . FLEXIBLE SIGMOIDOSCOPY N/A 09/28/2017   Procedure: FLEXIBLE SIGMOIDOSCOPY;  Surgeon: Rogene Houston, MD;  Location: [facility];  Service: Endoscopy;  Laterality: N/A;  . FRACTURE SURGERY     rt ankle   . GIVENS CAPSULE STUDY N/A 12/15/2016   Procedure: GIVENS CAPSULE STUDY;  Surgeon: Rogene Houston, MD;  Location: [facility];  Service: Endoscopy;  Laterality: N/A;  . KNEE SURGERY     meniscus repair  . TUBAL LIGATION      Family Psychiatric History: Please see initial evaluation for full details. I have reviewed the history. No updates at this time.     Family History:  Family History  Problem Relation Age of Onset  . Cancer Mother        colon cancer age74  . Hypertension Mother   . Stroke Mother   . Diabetes Mother   . Non-Hodgkin's lymphoma Father   . Hypertension Father   . Diabetes Father   . Stroke Maternal Grandmother   . Kidney disease Paternal Aunt   . Cancer Paternal Aunt   . Kidney disease Paternal Uncle   . Cancer Paternal Uncle     Social History:  Social History   Socioeconomic History  . Marital status: Married  Spouse name: Not on file  . Number of children: Not on file  . Years of education: Not on file  . Highest education level: Not on file  Occupational History  . Not on file  Social Needs  . Financial resource strain: Not on file  . Food insecurity:    Worry: Not on file    Inability: Not on file  . Transportation needs:    Medical: Not on file    Non-medical: Not on file  Tobacco Use  . Smoking status: Never Smoker  . Smokeless tobacco: Never Used  Substance and Sexual Activity  . Alcohol use: No  . Drug use: No  . Sexual activity: Yes    Birth control/protection: Surgical  Lifestyle  . Physical activity:    Days per week: Not on file    Minutes per session: Not on file  . Stress: Not on  file  Relationships  . Social connections:    Talks on phone: Not on file    Gets together: Not on file    Attends religious service: Not on file    Active member of club or organization: Not on file    Attends meetings of clubs or organizations: Not on file    Relationship status: Not on file  Other Topics Concern  . Not on file  Social History Narrative   Works with special needs adults.    Allergies: No Known Allergies  Metabolic Disorder Labs: Lab Results  Component Value Date   HGBA1C 6.5 (H) 03/16/2016   MPG 140 03/16/2016   MPG 140 11/28/2015   No results found for: PROLACTIN No results found for: CHOL, TRIG, HDL, CHOLHDL, VLDL, LDLCALC Lab Results  Component Value Date   TSH 1.49 02/21/2018   TSH 5.142 (H) 09/27/2017    Therapeutic Level Labs: No results found for: LITHIUM No results found for: VALPROATE No components found for:  CBMZ  Current Medications: Current Outpatient Medications  Medication Sig Dispense Refill  . acetaminophen (TYLENOL 8 HOUR ARTHRITIS PAIN) 650 MG CR tablet Take 1,300 mg by mouth every morning. *May take additional as needed    . albuterol (PROVENTIL HFA;VENTOLIN HFA) 108 (90 BASE) MCG/ACT inhaler Inhale 2 puffs into the lungs every 4 (four) hours as needed for wheezing or shortness of breath.    Derrill Memo ON 01/07/2019] ALPRAZolam (XANAX) 0.25 MG tablet Take 1 tablet (0.25 mg total) by mouth 2 (two) times daily as needed for anxiety. 30 tablet 0  . Ascorbic Acid (VITAMIN C) 1000 MG tablet Take 1,000 mg 2 (two) times daily by mouth.    Marland Kitchen aspirin EC 81 MG tablet Take 81 mg by mouth daily.    . ciprofloxacin (CIPRO) 500 MG tablet Take 1 tablet (500 mg total) by mouth 2 (two) times daily. 14 tablet 0  . Dulaglutide (TRULICITY) 9.47 ML/4.6TK SOPN Inject 0.5 mLs into the skin every Monday. On  Monday's.     . DULoxetine (CYMBALTA) 60 MG capsule Take 1 capsule (60 mg total) by mouth 2 (two) times daily. 180 capsule 0  . glipiZIDE (GLUCOTROL) 5  MG tablet Take 5 mg by mouth 2 (two) times daily.     Marland Kitchen HYDROcodone-acetaminophen (NORCO/VICODIN) 5-325 MG tablet Take 1 tablet by mouth every 4 (four) hours as needed. 10 tablet 0  . levothyroxine (SYNTHROID, LEVOTHROID) 100 MCG tablet Take 100 mcg daily by mouth.   1  . meclizine (ANTIVERT) 25 MG tablet Take 1 tablet (25 mg total) by mouth 3 (  three) times daily as needed for dizziness. 30 tablet 0  . metoCLOPramide (REGLAN) 10 MG tablet Take 1 tablet (10 mg total) by mouth every 6 (six) hours as needed for nausea (or headache). 30 tablet 0  . metroNIDAZOLE (FLAGYL) 500 MG tablet Take 1 tablet (500 mg total) by mouth 2 (two) times daily. 14 tablet 0  . montelukast (SINGULAIR) 10 MG tablet Take 10 mg by mouth at bedtime.    Marland Kitchen omeprazole (PRILOSEC OTC) 20 MG tablet Take 1 tablet (20 mg total) by mouth daily. (Patient taking differently: Take 20 mg by mouth every evening. ) 20 tablet 5  . ondansetron (ZOFRAN ODT) 4 MG disintegrating tablet Take 1 tablet (4 mg total) by mouth every 8 (eight) hours as needed for nausea or vomiting. 20 tablet 0  . ondansetron (ZOFRAN) 4 MG tablet Take 1 tablet (4 mg total) by mouth every 6 (six) hours. 12 tablet 0  . oxycodone (OXY-IR) 5 MG capsule Take 1 capsule (5 mg total) by mouth every 4 (four) hours as needed. 12 capsule 0  . promethazine (PHENERGAN) 25 MG tablet Take 1 tablet (25 mg total) by mouth every 6 (six) hours as needed for nausea or vomiting. 30 tablet 0  . traZODone (DESYREL) 150 MG tablet Take 1 tablet (150 mg total) by mouth at bedtime as needed for sleep. 90 tablet 0  . valACYclovir (VALTREX) 1000 MG tablet Take 2 g 2 (two) times daily by mouth.  2  . lisinopril-hydrochlorothiazide (PRINZIDE,ZESTORETIC) 20-12.5 MG tablet Take 1 tablet daily by mouth. Resume in 3 days 30 tablet 11   No current facility-administered medications for this visit.      Musculoskeletal: Strength & Muscle Tone: within normal limits Gait & Station: normal Patient leans:  N/A  Psychiatric Specialty Exam: Review of Systems  Psychiatric/Behavioral: Negative for depression, hallucinations, memory loss, substance abuse and suicidal ideas. The patient is nervous/anxious. The patient does not have insomnia.   All other systems reviewed and are negative.   Blood pressure 125/75, pulse 83, height 5\' 6"  (1.676 m), weight 270 lb (122.5 kg), last menstrual period 03/27/2014, SpO2 95 %.Body mass index is 43.58 kg/m.  General Appearance: Fairly Groomed  Eye Contact:  Good  Speech:  Clear and Coherent  Volume:  Normal  Mood:  "better"  Affect:  Appropriate, Congruent and Full Range  Thought Process:  Coherent  Orientation:  Full (Time, Place, and Person)  Thought Content: Logical   Suicidal Thoughts:  No  Homicidal Thoughts:  No  Memory:  Immediate;   Good  Judgement:  Good  Insight:  Good  Psychomotor Activity:  Normal  Concentration:  Concentration: Good and Attention Span: Good  Recall:  Good  Fund of Knowledge: Good  Language: Good  Akathisia:  No  Handed:  Right  AIMS (if indicated): not done  Assets:  Communication Skills Desire for Improvement  ADL's:  Intact  Cognition: WNL  Sleep:  Fair   Screenings:   Assessment and Plan:  ROSCHELLE CALANDRA is a 58 y.o. year old female with a history of depression, hypothyroidism, hypertension, IBS,diabetes, sciatica , who presents for follow up appointment for MDD (major depressive disorder), recurrent, in partial remission ([place])  # MDD, recurrent in partial remission Patient reports steady improvement in depressive symptoms since the last visit.  Will continue duloxetine to target depression.  We have Xanax as needed for anxiety.  Will uptitrate trazodone as needed for insomnia.  Discussed sleep hygiene, behavioral activation.  Plan 1.  Continueduloxetine 60 mg twice a day 3.Continuetrazodone 150 mg at night as needed for sleep 4.Return to clinic in three months for 15 mins 5. Continue Xanax0.25  mg up to twice a day as needed for anxiety (she takes two tabs per week)  Past trials of medication: fluoxetine, Effexor, duloxetine, buspar (unable to tolerate), Xanax,   The patient demonstrates the following risk factors for suicide: Chronic risk factors for suicide include: psychiatric disorder of depressionand chronic pain. Acute risk factorsfor suicide include: family or marital conflict. Protective factorsfor this patient include: responsibility to others (children, family), coping skills and hope for the future. Considering these factors, the overall suicide risk at this point appears to be low. Patient isappropriate for outpatient follow up.   Norman Clay, MD 12/10/2018, 8:58 AM

## 2018-12-07 NOTE — Telephone Encounter (Signed)
ordered

## 2018-12-07 NOTE — Telephone Encounter (Signed)
Dr Modesta Messing Patient called stating that she took her last Xanax this AM. Requesting refill . Next Appointment 1/27-2020

## 2018-12-10 ENCOUNTER — Encounter (HOSPITAL_COMMUNITY): Payer: Self-pay | Admitting: Psychiatry

## 2018-12-10 ENCOUNTER — Ambulatory Visit (INDEPENDENT_AMBULATORY_CARE_PROVIDER_SITE_OTHER): Payer: Self-pay | Admitting: Psychiatry

## 2018-12-10 VITALS — BP 125/75 | HR 83 | Ht 66.0 in | Wt 270.0 lb

## 2018-12-10 DIAGNOSIS — F3341 Major depressive disorder, recurrent, in partial remission: Secondary | ICD-10-CM

## 2018-12-10 MED ORDER — ALPRAZOLAM 0.25 MG PO TABS: 0.2500 mg | ORAL_TABLET | Freq: Two times a day (BID) | ORAL | 0 refills | Status: DC | PRN

## 2018-12-10 MED ORDER — TRAZODONE HCL 100 MG PO TABS: 150.0000 mg | ORAL_TABLET | Freq: Every evening | ORAL | 0 refills | Status: DC | PRN

## 2018-12-10 MED ORDER — TRAZODONE HCL 150 MG PO TABS: 150.0000 mg | ORAL_TABLET | Freq: Every evening | ORAL | 0 refills | Status: DC | PRN

## 2018-12-10 NOTE — Patient Instructions (Addendum)
1. Continueduloxetine 60 mg twice a day 3.Continuetrazodone 100-150 mg at night as needed for sleep 4.Return to clinic in three months for 15 mins 5. Continue Xanax0.25 mg up to twice a day as needed for anxiety

## 2018-12-12 ENCOUNTER — Encounter (HOSPITAL_COMMUNITY): Payer: Self-pay | Admitting: Emergency Medicine

## 2018-12-12 ENCOUNTER — Emergency Department (HOSPITAL_COMMUNITY): Payer: Self-pay

## 2018-12-12 ENCOUNTER — Other Ambulatory Visit: Payer: Self-pay

## 2018-12-12 ENCOUNTER — Emergency Department (HOSPITAL_COMMUNITY)
Admission: EM | Admit: 2018-12-12 | Discharge: 2018-12-13 | Disposition: A | Discharge status: Home / Self Care | Payer: Self-pay | Attending: Emergency Medicine | Admitting: Emergency Medicine

## 2018-12-12 DIAGNOSIS — N200 Calculus of kidney: Secondary | ICD-10-CM | POA: Insufficient documentation

## 2018-12-12 DIAGNOSIS — R110 Nausea: Secondary | ICD-10-CM | POA: Insufficient documentation

## 2018-12-12 DIAGNOSIS — Z79899 Other long term (current) drug therapy: Secondary | ICD-10-CM | POA: Insufficient documentation

## 2018-12-12 DIAGNOSIS — I10 Essential (primary) hypertension: Secondary | ICD-10-CM | POA: Insufficient documentation

## 2018-12-12 DIAGNOSIS — Z7982 Long term (current) use of aspirin: Secondary | ICD-10-CM | POA: Insufficient documentation

## 2018-12-12 DIAGNOSIS — Z7984 Long term (current) use of oral hypoglycemic drugs: Secondary | ICD-10-CM | POA: Insufficient documentation

## 2018-12-12 DIAGNOSIS — E119 Type 2 diabetes mellitus without complications: Secondary | ICD-10-CM | POA: Insufficient documentation

## 2018-12-12 DIAGNOSIS — E079 Disorder of thyroid, unspecified: Secondary | ICD-10-CM | POA: Insufficient documentation

## 2018-12-12 LAB — CBC WITH DIFFERENTIAL/PLATELET
Abs Immature Granulocytes: 0.08 10*3/uL — ABNORMAL HIGH (ref 0.00–0.07)
BASOS ABS: 0.1 10*3/uL (ref 0.0–0.1)
BASOS PCT: 0 %
Eosinophils Absolute: 0.2 10*3/uL (ref 0.0–0.5)
Eosinophils Relative: 1 %
HCT: 43.2 % (ref 36.0–46.0)
Hemoglobin: 13.5 g/dL (ref 12.0–15.0)
IMMATURE GRANULOCYTES: 1 %
Lymphocytes Relative: 20 %
Lymphs Abs: 3.3 10*3/uL (ref 0.7–4.0)
MCH: 26.7 pg (ref 26.0–34.0)
MCHC: 31.3 g/dL (ref 30.0–36.0)
MCV: 85.5 fL (ref 80.0–100.0)
Monocytes Absolute: 1 10*3/uL (ref 0.1–1.0)
Monocytes Relative: 6 %
NEUTROS PCT: 72 %
NRBC: 0 % (ref 0.0–0.2)
Neutro Abs: 12 10*3/uL — ABNORMAL HIGH (ref 1.7–7.7)
PLATELETS: 311 10*3/uL (ref 150–400)
RBC: 5.05 MIL/uL (ref 3.87–5.11)
RDW: 13.7 % (ref 11.5–15.5)
WBC: 16.6 10*3/uL — ABNORMAL HIGH (ref 4.0–10.5)

## 2018-12-12 LAB — POC URINE PREG, ED: Preg Test, Ur: NEGATIVE

## 2018-12-12 LAB — BASIC METABOLIC PANEL
Anion gap: 7 (ref 5–15)
BUN: 13 mg/dL (ref 6–20)
CALCIUM: 9.3 mg/dL (ref 8.9–10.3)
CO2: 29 mmol/L (ref 22–32)
Chloride: 104 mmol/L (ref 98–111)
Creatinine, Ser: 0.62 mg/dL (ref 0.44–1.00)
Glucose, Bld: 103 mg/dL — ABNORMAL HIGH (ref 70–99)
Potassium: 3.8 mmol/L (ref 3.5–5.1)
Sodium: 140 mmol/L (ref 135–145)

## 2018-12-12 MED ORDER — ONDANSETRON 4 MG PO TBDP
4.0000 mg | ORAL_TABLET | Freq: Once | ORAL | Status: AC
  Administered 2018-12-12: 4 mg via ORAL
  Filled 2018-12-12: qty 1

## 2018-12-12 MED ORDER — HYDROMORPHONE HCL 1 MG/ML IJ SOLN
1.0000 mg | Freq: Once | INTRAMUSCULAR | Status: AC
  Administered 2018-12-12: 1 mg via INTRAMUSCULAR
  Filled 2018-12-12: qty 1

## 2018-12-12 NOTE — ED Triage Notes (Signed)
Pt c/o right lower back pain since this am.

## 2018-12-13 LAB — URINALYSIS, ROUTINE W REFLEX MICROSCOPIC
BACTERIA UA: NONE SEEN
Bilirubin Urine: NEGATIVE
GLUCOSE, UA: NEGATIVE mg/dL
Ketones, ur: NEGATIVE mg/dL
LEUKOCYTES UA: NEGATIVE
Nitrite: NEGATIVE
PROTEIN: NEGATIVE mg/dL
Specific Gravity, Urine: 1.013 (ref 1.005–1.030)
pH: 5 (ref 5.0–8.0)

## 2018-12-13 MED ORDER — TAMSULOSIN HCL 0.4 MG PO CAPS
0.4000 mg | ORAL_CAPSULE | Freq: Once | ORAL | Status: AC
  Administered 2018-12-13: 0.4 mg via ORAL
  Filled 2018-12-13: qty 1

## 2018-12-13 MED ORDER — KETOROLAC TROMETHAMINE 30 MG/ML IJ SOLN
15.0000 mg | Freq: Once | INTRAMUSCULAR | Status: AC
  Administered 2018-12-13: 15 mg via INTRAVENOUS
  Filled 2018-12-13: qty 1

## 2018-12-13 MED ORDER — IOPAMIDOL (ISOVUE-300) INJECTION 61%
100.0000 mL | Freq: Once | INTRAVENOUS | Status: AC | PRN
  Administered 2018-12-13: 100 mL via INTRAVENOUS

## 2018-12-13 MED ORDER — OXYCODONE-ACETAMINOPHEN 5-325 MG PO TABS: 1.0000 | ORAL_TABLET | ORAL | 0 refills | Status: DC | PRN

## 2018-12-13 MED ORDER — HYDROMORPHONE HCL 1 MG/ML IJ SOLN
1.0000 mg | Freq: Once | INTRAMUSCULAR | Status: AC
  Administered 2018-12-13: 1 mg via INTRAVENOUS
  Filled 2018-12-13: qty 1

## 2018-12-13 MED ORDER — OXYCODONE-ACETAMINOPHEN 5-325 MG PO TABS: 2.0000 | ORAL_TABLET | ORAL | 0 refills | Status: DC | PRN

## 2018-12-13 MED ORDER — TAMSULOSIN HCL 0.4 MG PO CAPS: 0.4000 mg | ORAL_CAPSULE | Freq: Every day | ORAL | 0 refills | Status: AC

## 2018-12-13 NOTE — Discharge Instructions (Addendum)
You have a kidney stone that has just moved out of your right kidney which is the source of your pain today.  You have been prescribed pain medicine to help you with persistent pain at this stone moves.  Strain all of your urine is that you will know if the stone passes.  Call Dr. Lynne Logan office for an office follow-up with him.  You have also been prescribed Flomax which is a medicine that can help kidney stones move quicker and easier.    Get rechecked immediately for any fevers, uncontrolled vomiting or worsening pain.

## 2018-12-13 NOTE — ED Provider Notes (Signed)
[facility] EMERGENCY DEPARTMENT Provider Note   CSN: [id number] Arrival date & time: 12/12/18  2002     History   Chief Complaint Chief Complaint  Patient presents with  . Back Pain    HPI Kristin Bailey is a 58 y.o. female with a history of diabetes, GERD, diverticulitis, hypertension and IBS presenting with a 1 day history of right mid to lower back pain.  She states she woke this morning with moderate pain in her right lower back which has been waxing and waning, at times becoming very intense, sharp and painful.  Pain is worsened with movement and certain positions.  She denies injuries or overuse.  She reports significant nausea when pain is severe, denies vomiting, also no fevers or chills, denies abdominal pain, dysuria, hematuria and there is no radiation of pain into her lower extremities.   She has taken Tylenol with no relief of symptoms.   The history is provided by the patient.    Past Medical History:  Diagnosis Date  . Depression   . Diabetes mellitus without complication ([place])   . Diverticulitis   . GERD (gastroesophageal reflux disease)   . Hypertension   . IBS (irritable bowel syndrome)   . Pneumonia   . Thyroid disease     Patient Active Problem List   Diagnosis Date Noted  . MDD (major depressive disorder), recurrent episode, mild ([place]) 09/19/2018  . Abdominal pain, left lower quadrant   . Diverticulitis 09/26/2017  . Hyponatremia 09/26/2017  . Noninfectious gastroenteritis 11/28/2016  . Enteritis 11/04/2016  . Lactic acidosis 11/04/2016  . Enterocolitis 03/17/2016  . Gastroenteritis, infectious 03/17/2016  . Leukocytosis 11/28/2015  . Acute respiratory failure with hypoxia ([place]) 11/28/2015  . Controlled type 2 diabetes mellitus without complication, without long-term current use of insulin ([place]) 11/28/2015  . UTI (lower urinary tract infection) 11/27/2015  . Colitis 11/27/2015  . AKI (acute kidney injury) ([place]) 11/27/2015    Past Surgical  History:  Procedure Laterality Date  . AGILE CAPSULE N/A 11/23/2016   Procedure: AGILE CAPSULE;  Surgeon: Rogene Houston, MD;  Location: [facility] SU[facility];  Service: Endoscopy;  Laterality: N/A;  8:30  . BREAST EXCISIONAL BIOPSY Right   . CARPAL TUNNEL RELEASE    . CESAREAN SECTION    . CHOLECYSTECTOMY    . COLONOSCOPY N/A 08/28/2014   Procedure: COLONOSCOPY;  Surgeon: Rogene Houston, MD;  Location: [facility];  Service: Endoscopy;  Laterality: N/A;  1030  . CYST REMOVAL HAND    . ESOPHAGOGASTRODUODENOSCOPY N/A 03/19/2016   Procedure: ESOPHAGOGASTRODUODENOSCOPY (EGD);  Surgeon: Rogene Houston, MD;  Location: [facility];  Service: Endoscopy;  Laterality: N/A;  . FLEXIBLE SIGMOIDOSCOPY N/A 09/28/2017   Procedure: FLEXIBLE SIGMOIDOSCOPY;  Surgeon: Rogene Houston, MD;  Location: [facility];  Service: Endoscopy;  Laterality: N/A;  . FRACTURE SURGERY     rt ankle   . GIVENS CAPSULE STUDY N/A 12/15/2016   Procedure: GIVENS CAPSULE STUDY;  Surgeon: Rogene Houston, MD;  Location: [facility];  Service: Endoscopy;  Laterality: N/A;  . KNEE SURGERY     meniscus repair  . TUBAL LIGATION       OB History    Gravida  1   Para  1   Term  1   Preterm      AB      Living  1     SAB      TAB      Ectopic  Multiple      Live Births               Home Medications    Prior to Admission medications   Medication Sig Start Date End Date Taking? Authorizing Provider  acetaminophen (TYLENOL 8 HOUR ARTHRITIS PAIN) 650 MG CR tablet Take 1,300 mg by mouth every morning. *May take additional as needed    [provider]  albuterol (PROVENTIL HFA;VENTOLIN HFA) 108 (90 BASE) MCG/ACT inhaler Inhale 2 puffs into the lungs every 4 (four) hours as needed for wheezing or shortness of breath.    [provider]  ALPRAZolam Duanne Moron) 0.25 MG tablet Take 1 tablet (0.25 mg total) by mouth 2 (two) times daily as needed for anxiety. 01/07/19   Norman Clay, MD  Ascorbic  Acid (VITAMIN C) 1000 MG tablet Take 1,000 mg 2 (two) times daily by mouth.    [provider]  aspirin EC 81 MG tablet Take 81 mg by mouth daily.    [provider]  ciprofloxacin (CIPRO) 500 MG tablet Take 1 tablet (500 mg total) by mouth 2 (two) times daily. 03/22/18   Ashley Murrain, NP  Dulaglutide (TRULICITY) 0.38 UE/2.8MK SOPN Inject 0.5 mLs into the skin every Monday. On  Monday's.     [provider]  DULoxetine (CYMBALTA) 60 MG capsule Take 1 capsule (60 mg total) by mouth 2 (two) times daily. 09/19/18   Norman Clay, MD  glipiZIDE (GLUCOTROL) 5 MG tablet Take 5 mg by mouth 2 (two) times daily.     [provider]  HYDROcodone-acetaminophen (NORCO/VICODIN) 5-325 MG tablet Take 1 tablet by mouth every 4 (four) hours as needed. 09/07/17   Tanna Furry, MD  levothyroxine (SYNTHROID, LEVOTHROID) 100 MCG tablet Take 100 mcg daily by mouth.  08/16/17   [provider]  lisinopril-hydrochlorothiazide (PRINZIDE,ZESTORETIC) 20-12.5 MG tablet Take 1 tablet daily by mouth. Resume in 3 days 09/29/17 09/23/18  Kathie Dike, MD  meclizine (ANTIVERT) 25 MG tablet Take 1 tablet (25 mg total) by mouth 3 (three) times daily as needed for dizziness. 07/11/18   Julianne Rice, MD  metoCLOPramide (REGLAN) 10 MG tablet Take 1 tablet (10 mg total) by mouth every 6 (six) hours as needed for nausea (or headache). 34/91/79   Delora Fuel, MD  metroNIDAZOLE (FLAGYL) 500 MG tablet Take 1 tablet (500 mg total) by mouth 2 (two) times daily. 03/22/18   Ashley Murrain, NP  montelukast (SINGULAIR) 10 MG tablet Take 10 mg by mouth at bedtime.    [provider]  omeprazole (PRILOSEC OTC) 20 MG tablet Take 1 tablet (20 mg total) by mouth daily. Patient taking differently: Take 20 mg by mouth every evening.  01/03/18   Rehman, Mechele Dawley, MD  ondansetron (ZOFRAN ODT) 4 MG disintegrating tablet Take 1 tablet (4 mg total) by mouth every 8 (eight) hours as needed for nausea or  vomiting. 02/22/18   Long, Wonda Olds, MD  ondansetron (ZOFRAN) 4 MG tablet Take 1 tablet (4 mg total) by mouth every 6 (six) hours. 07/11/18   Julianne Rice, MD  oxycodone (OXY-IR) 5 MG capsule Take 1 capsule (5 mg total) by mouth every 4 (four) hours as needed. 15/05/69   Delora Fuel, MD  oxyCODONE-acetaminophen (PERCOCET/ROXICET) 5-325 MG tablet Take 1 tablet by mouth every 4 (four) hours as needed. 12/13/18   Evalee Jefferson, PA-C  oxyCODONE-acetaminophen (PERCOCET/ROXICET) 5-325 MG tablet Take 2 tablets by mouth every 4 (four) hours as needed for severe pain. 12/13/18  Evalee Jefferson, PA-C  promethazine (PHENERGAN) 25 MG tablet Take 1 tablet (25 mg total) by mouth every 6 (six) hours as needed for nausea or vomiting. 03/22/18   Ashley Murrain, NP  tamsulosin (FLOMAX) 0.4 MG CAPS capsule Take 1 capsule (0.4 mg total) by mouth daily after supper for 7 days. 12/13/18 12/20/18  Evalee Jefferson, PA-C  traZODone (DESYREL) 150 MG tablet Take 1 tablet (150 mg total) by mouth at bedtime as needed for sleep. 12/10/18   Norman Clay, MD  valACYclovir (VALTREX) 1000 MG tablet Take 2 g 2 (two) times daily by mouth. 09/23/17   [provider]    Family History Family History  Problem Relation Age of Onset  . Cancer Mother        colon cancer age74  . Hypertension Mother   . Stroke Mother   . Diabetes Mother   . Non-Hodgkin's lymphoma Father   . Hypertension Father   . Diabetes Father   . Stroke Maternal Grandmother   . Kidney disease Paternal Aunt   . Cancer Paternal Aunt   . Kidney disease Paternal Uncle   . Cancer Paternal Uncle     Social History Social History   Tobacco Use  . Smoking status: Never Smoker  . Smokeless tobacco: Never Used  Substance Use Topics  . Alcohol use: No  . Drug use: No     Allergies   Patient has no known allergies.   Review of Systems Review of Systems  Constitutional: Negative for fever.  Respiratory: Negative for shortness of breath.   Cardiovascular:  Negative for chest pain and leg swelling.  Gastrointestinal: Negative for abdominal distention, abdominal pain and constipation.  Genitourinary: Negative for difficulty urinating, dysuria, flank pain, frequency and urgency.  Musculoskeletal: Positive for back pain. Negative for gait problem and joint swelling.  Skin: Negative for rash.  Neurological: Negative for weakness and numbness.     Physical Exam Updated Vital Signs BP 122/79   Pulse 84   Temp 99 F (37.2 C) (Oral)   Resp 18   Ht 5\' 6"  (1.676 m)   Wt 122.5 kg   LMP 03/27/2014 Comment: irregular  SpO2 96%   BMI 43.58 kg/m   Physical Exam Vitals signs and nursing note reviewed.  Constitutional:      Appearance: She is well-developed.  HENT:     Head: Normocephalic.  Eyes:     Conjunctiva/sclera: Conjunctivae normal.  Neck:     Musculoskeletal: Normal range of motion and neck supple.  Cardiovascular:     Rate and Rhythm: Normal rate.     Comments: Pedal pulses normal. Pulmonary:     Effort: Pulmonary effort is normal.  Abdominal:     General: Bowel sounds are normal. There is no distension or abdominal bruit.     Palpations: Abdomen is soft. There is no mass.     Tenderness: There is abdominal tenderness in the right upper quadrant and right lower quadrant. There is no guarding or rebound.     Hernia: No hernia is present.     Comments: Patient has reproducible back pain with deep palpation along the right upper and lower abdomen.  There is no guarding.  Musculoskeletal: Normal range of motion.     Lumbar back: She exhibits tenderness. She exhibits no swelling, no edema and no spasm.  Skin:    General: Skin is warm and dry.  Neurological:     Mental Status: She is alert.     Sensory: No sensory  deficit.     Motor: No tremor or atrophy.     Gait: Gait normal.     Comments: No strength deficit noted in hip and knee flexor and extensor muscle groups.  Ankle flexion and extension intact.  Negative straight leg  raise      ED Treatments / Results  Labs (all labs ordered are listed, but only abnormal results are displayed) Labs Reviewed  BASIC METABOLIC PANEL - Abnormal; Notable for the following components:      Result Value   Glucose, Bld 103 (*)    All other components within normal limits  CBC WITH DIFFERENTIAL/PLATELET - Abnormal; Notable for the following components:   WBC 16.6 (*)    Neutro Abs 12.0 (*)    Abs Immature Granulocytes 0.08 (*)    All other components within normal limits  URINALYSIS, ROUTINE W REFLEX MICROSCOPIC - Abnormal; Notable for the following components:   Hgb urine dipstick MODERATE (*)    All other components within normal limits  POC URINE PREG, ED    EKG None  Radiology Dg Chest 2 View  Result Date: 12/12/2018 CLINICAL DATA:  Pleuritic back pain EXAM: CHEST - 2 VIEW COMPARISON:  09/18/2018 FINDINGS: Minimal bibasilar atelectasis. Heart is normal size. No effusions. No acute bony abnormality. IMPRESSION: Minimal bibasilar atelectasis.  No active disease. Electronically Signed   By: Rolm Baptise M.D.   On: 12/12/2018 23:11   Dg Lumbar Spine Complete  Result Date: 12/12/2018 CLINICAL DATA:  Pleuritic back pain. Patient reports low back pain, onset this morning. No known injury. EXAM: LUMBAR SPINE - COMPLETE 4+ VIEW COMPARISON:  Reformats from abdominal CT 09/26/2017 FINDINGS: The alignment is maintained. Vertebral body heights are normal. There is no listhesis. The posterior elements are intact. Mild disc space narrowing and endplate spurring at O9-G2 and L3-L4. Facet arthropathy L3-L4 through the lumbosacral junction. No fracture. Sacroiliac joints are symmetric and normal. Bone island in the right iliac bone. IMPRESSION: Mild degenerative disc disease and facet arthropathy in the lumbar spine. No acute bony abnormality. Electronically Signed   By: Keith Rake M.D.   On: 12/12/2018 23:13   Ct Abdomen Pelvis W Contrast  Result Date: 12/13/2018 CLINICAL  DATA:  Right lower back pain EXAM: CT ABDOMEN AND PELVIS WITH CONTRAST TECHNIQUE: Multidetector CT imaging of the abdomen and pelvis was performed using the standard protocol following bolus administration of intravenous contrast. CONTRAST:  17mL ISOVUE-300 IOPAMIDOL (ISOVUE-300) INJECTION 61% COMPARISON:  02/22/2018 FINDINGS: Lower chest: Lung bases demonstrate subpleural nodularity. No acute consolidation or pleural effusion. Heart size within normal limits Hepatobiliary: Hepatic steatosis. No biliary dilatation. Status post cholecystectomy Pancreas: Unremarkable. No pancreatic ductal dilatation or surrounding inflammatory changes. Spleen: Normal in size without focal abnormality. Adrenals/Urinary Tract: Adrenal glands are normal. Prominent right renal pelvis without hydronephrosis. Mild left renal pelvis dilatation. 2 mm stone at the left UPJ. Bladder normal Stomach/Bowel: Stomach is within normal limits. Appendix appears normal. No evidence of bowel wall thickening, distention, or inflammatory changes. Sigmoid colon diverticular disease without acute inflammatory change. Vascular/Lymphatic: No significant vascular findings are present. No enlarged abdominal or pelvic lymph nodes. Reproductive: Uterus and bilateral adnexa are unremarkable. Other: Negative for free air or free fluid. Musculoskeletal: No acute or significant osseous findings. IMPRESSION: 1. Minimal left renal pelvis dilatation, felt secondary to a 2 mm stone at the left UPJ. 2. Hepatic steatosis 3. Sigmoid colon diverticular disease without acute inflammatory change Electronically Signed   By: Donavan Foil M.D.   On:  12/13/2018 01:18    Procedures Procedures (including critical care time)  Medications Ordered in ED Medications  ondansetron (ZOFRAN-ODT) disintegrating tablet 4 mg (4 mg Oral Given 12/12/18 2301)  HYDROmorphone (DILAUDID) injection 1 mg (1 mg Intramuscular Given 12/12/18 2301)  iopamidol (ISOVUE-300) 61 % injection 100 mL  (100 mLs Intravenous Contrast Given 12/13/18 0034)  HYDROmorphone (DILAUDID) injection 1 mg (1 mg Intravenous Given 12/13/18 0016)  ketorolac (TORADOL) 30 MG/ML injection 15 mg (15 mg Intravenous Given 12/13/18 0156)  tamsulosin (FLOMAX) capsule 0.4 mg (0.4 mg Oral Given 12/13/18 0157)     Initial Impression / Assessment and Plan / ED Course  I have reviewed the triage vital signs and the nursing notes.  Pertinent labs & imaging results that were available during my care of the patient were reviewed by me and considered in my medical decision making (see chart for details).     Pt with left UVJ 2 mm stone.  There is no urinary infection, no significant hydronephrosis.  She was prescribed oxycodone, Flomax.  Discussed straining urine, follow-up with urology, referral was given.  Discussed strict return precautions.  Final Clinical Impressions(s) / ED Diagnoses   Final diagnoses:  Kidney stone    ED Discharge Orders         Ordered    oxyCODONE-acetaminophen (PERCOCET/ROXICET) 5-325 MG tablet  Every 4 hours PRN     12/13/18 0130    tamsulosin (FLOMAX) 0.4 MG CAPS capsule  Daily after supper     12/13/18 0130    oxyCODONE-acetaminophen (PERCOCET/ROXICET) 5-325 MG tablet  Every 4 hours PRN     12/13/18 0132           Evalee Jefferson, PA-C 12/14/18 0159    Milton Ferguson, MD 12/15/18 1112

## 2018-12-14 MED FILL — Oxycodone w/ Acetaminophen Tab 5-325 MG: ORAL | Qty: 6 | Status: AC

## 2019-01-29 ENCOUNTER — Encounter (INDEPENDENT_AMBULATORY_CARE_PROVIDER_SITE_OTHER): Payer: Self-pay | Admitting: Internal Medicine

## 2019-01-29 ENCOUNTER — Encounter (INDEPENDENT_AMBULATORY_CARE_PROVIDER_SITE_OTHER): Payer: Self-pay | Admitting: *Deleted

## 2019-01-29 ENCOUNTER — Ambulatory Visit (INDEPENDENT_AMBULATORY_CARE_PROVIDER_SITE_OTHER): Payer: Self-pay | Admitting: Internal Medicine

## 2019-01-29 ENCOUNTER — Other Ambulatory Visit: Payer: Self-pay

## 2019-01-29 ENCOUNTER — Other Ambulatory Visit (INDEPENDENT_AMBULATORY_CARE_PROVIDER_SITE_OTHER): Payer: Self-pay | Admitting: *Deleted

## 2019-01-29 VITALS — BP 116/76 | HR 97 | Temp 99.1°F | Ht 66.0 in | Wt 256.0 lb

## 2019-01-29 DIAGNOSIS — K5732 Diverticulitis of large intestine without perforation or abscess without bleeding: Secondary | ICD-10-CM

## 2019-01-29 DIAGNOSIS — R1030 Lower abdominal pain, unspecified: Secondary | ICD-10-CM

## 2019-01-29 MED ORDER — HYDROCODONE-ACETAMINOPHEN 5-325 MG PO TABS: 1.0000 | ORAL_TABLET | Freq: Four times a day (QID) | ORAL | 0 refills | Status: DC | PRN

## 2019-01-29 MED ORDER — METRONIDAZOLE 500 MG PO TABS: 500.0000 mg | ORAL_TABLET | Freq: Two times a day (BID) | ORAL | 0 refills | Status: AC

## 2019-01-29 MED ORDER — CIPROFLOXACIN HCL 500 MG PO TABS: 500.0000 mg | ORAL_TABLET | Freq: Two times a day (BID) | ORAL | 0 refills | Status: DC

## 2019-01-29 NOTE — Progress Notes (Signed)
   Subjective:    Patient ID: Kristin Bailey, female    DOB: Oct 06, 1961, 58 y.o.   MRN: [id number]  HPI Presents today with c/o abdominal pain. Hx of chronic abdominal pain.   09/28/2017 Flex sigmoid: Dr. Laural Golden: abnormal CT of the GI tract Impression:               - Erythematous granular mucosa in the mid sigmoid                            colon. Biopsied.                           - Diverticulosis in the sigmoid colon.                           - Internal hemorrhoids.                           Comment: Mild segmental colitis at sigmoid colon.                            This finding would not explain patient's symptom                            complex.                           No colonic stricture noted.                           Barium enema to be performedas outpatient. Biopsy: focal erosion with associated inflammation. Negative for dysplasia.  10/18/2017 Barium enema: IMPRESSION: Diverticulosis of the left side of the colon. Otherwise normal exam. Review of Systems     Objective:   Physical Exam Blood pressure 116/76, pulse 97, temperature 99.1 F (37.3 C), height 5\' 6"  (1.676 m), weight 256 lb (116.1 kg), last menstrual period 03/27/2014. Alert and oriented. Skin warm and dry. Oral mucosa is moist.   . Sclera anicteric, conjunctivae is pink. Thyroid not enlarged. No cervical lymphadenopathy. Lungs clear. Heart regular rate and rhythm.  Abdomen is soft. Bowel sounds are positive. No hepatomegaly. No abdominal masses felt. No tenderness.  No edema to lower extremities.           Assessment & Plan:  Lower abdominal pain. R/o diverticulitis. CT abdomen/pelvis with CM. Rx for Cipro and Flagyl to her pharmacy x 14 days. Rx for Hydrocodone. Patient advised if pain worsens, to go to the ED. Note for work x 1 week given to patient.

## 2019-01-29 NOTE — Patient Instructions (Signed)
CT abdomen/pelvis. Advised to go to the ED if pain/symptoms worsens.

## 2019-01-30 ENCOUNTER — Telehealth (INDEPENDENT_AMBULATORY_CARE_PROVIDER_SITE_OTHER): Payer: Self-pay | Admitting: Internal Medicine

## 2019-01-30 NOTE — Progress Notes (Addendum)
   Subjective:    Patient ID: Kristin Bailey, female    DOB: 08/16/1961, 58 y.o.   MRN: [id number]  HPI Subjective   Patient ID: Kristin Bailey, female    DOB: 04-20-1961, 58 y.o.   MRN: [id number]  HPI Presents today with c/o abdominal pain. Hx of chronic abdominal pain.  Hx of diverticulitis. Pain rt lower quadrant and lower abdomen. No fever. Her appetite is okay. No weight loss.  09/28/2017 Flex sigmoid: Dr. Laural Golden: abnormal CT of the GI tract Impression: - Erythematous granular mucosa in the mid sigmoid  colon. Biopsied. - Diverticulosis in the sigmoid colon. - Internal hemorrhoids. Comment: Mild segmental colitis at sigmoid colon.  This finding would not explain patient's symptom  complex. No colonic stricture noted. Barium enema to be performedas outpatient. Biopsy: focal erosion with associated inflammation. Negative for dysplasia.  10/18/2017 Barium enema: IMPRESSION: Diverticulosis of the left side of the colon. Otherwise normal exam. Review of Systems     Objective:   Objective   Physical Exam Blood pressure 116/76, pulse 97, temperature 99.1 F (37.3 C), height 5\' 6"  (1.676 m), weight 256 lb (116.1 kg), last menstrual period 03/27/2014. Alert and oriented. Skin warm and dry. Oral mucosa is moist.   . Sclera anicteric, conjunctivae is pink. Thyroid not enlarged. No cervical lymphadenopathy. Lungs clear. Heart regular rate and rhythm.  Abdomen is soft. Bowel sounds are positive. No hepatomegaly. No abdominal masses felt. No tenderness.  No edema to lower extremities.           Assessment & Plan:  Lower abdominal pain. R/o diverticulitis. CT abdomen/pelvis with CM. Rx for Cipro and Flagyl to her pharmacy x 14 days. Rx for Hydrocodone.  Patient advised if pain worsens, to go to the ED. Note for work x 1 week given to patient.      Review of Systems     Objective:   Physical Exam        Assessment & Plan:

## 2019-01-30 NOTE — Telephone Encounter (Signed)
She was advised to go get a laxative and if pain increases, go to the ED

## 2019-01-30 NOTE — Telephone Encounter (Signed)
Patient called stated she is not feeling any better - states she hasn't had a bowel movement in 4 days - still having gas and cramping - please call [phone]

## 2019-02-01 ENCOUNTER — Other Ambulatory Visit: Payer: Self-pay

## 2019-02-01 ENCOUNTER — Encounter (HOSPITAL_COMMUNITY): Payer: Self-pay

## 2019-02-01 ENCOUNTER — Ambulatory Visit (HOSPITAL_COMMUNITY)
Admission: RE | Admit: 2019-02-01 | Discharge: 2019-02-01 | Disposition: A | Discharge status: Home / Self Care | Payer: Self-pay | Source: Ambulatory Visit | Attending: Internal Medicine | Admitting: Internal Medicine

## 2019-02-01 DIAGNOSIS — K5732 Diverticulitis of large intestine without perforation or abscess without bleeding: Secondary | ICD-10-CM | POA: Insufficient documentation

## 2019-02-01 DIAGNOSIS — R1030 Lower abdominal pain, unspecified: Secondary | ICD-10-CM | POA: Insufficient documentation

## 2019-02-01 LAB — CREATININE, SERUM: Creat: 0.73 mg/dL (ref 0.50–1.05)

## 2019-02-01 MED ORDER — IOHEXOL 300 MG/ML  SOLN
100.0000 mL | Freq: Once | INTRAMUSCULAR | Status: AC | PRN
  Administered 2019-02-01: 100 mL via INTRAVENOUS

## 2019-03-01 ENCOUNTER — Encounter (HOSPITAL_COMMUNITY): Payer: Self-pay | Admitting: Emergency Medicine

## 2019-03-01 ENCOUNTER — Other Ambulatory Visit: Payer: Self-pay

## 2019-03-01 ENCOUNTER — Observation Stay (HOSPITAL_COMMUNITY)
Admission: EM | Admit: 2019-03-01 | Discharge: 2019-03-03 | Disposition: A | Discharge status: Home / Self Care | Payer: 59 | Attending: Internal Medicine | Admitting: Internal Medicine

## 2019-03-01 ENCOUNTER — Emergency Department (HOSPITAL_COMMUNITY): Payer: 59

## 2019-03-01 DIAGNOSIS — R740 Nonspecific elevation of levels of transaminase and lactic acid dehydrogenase [LDH]: Secondary | ICD-10-CM | POA: Insufficient documentation

## 2019-03-01 DIAGNOSIS — D72829 Elevated white blood cell count, unspecified: Secondary | ICD-10-CM | POA: Diagnosis not present

## 2019-03-01 DIAGNOSIS — K219 Gastro-esophageal reflux disease without esophagitis: Secondary | ICD-10-CM | POA: Diagnosis not present

## 2019-03-01 DIAGNOSIS — Z79899 Other long term (current) drug therapy: Secondary | ICD-10-CM | POA: Diagnosis not present

## 2019-03-01 DIAGNOSIS — F418 Other specified anxiety disorders: Secondary | ICD-10-CM

## 2019-03-01 DIAGNOSIS — Z7984 Long term (current) use of oral hypoglycemic drugs: Secondary | ICD-10-CM | POA: Diagnosis not present

## 2019-03-01 DIAGNOSIS — I10 Essential (primary) hypertension: Secondary | ICD-10-CM | POA: Diagnosis not present

## 2019-03-01 DIAGNOSIS — K529 Noninfective gastroenteritis and colitis, unspecified: Principal | ICD-10-CM

## 2019-03-01 DIAGNOSIS — R55 Syncope and collapse: Secondary | ICD-10-CM | POA: Diagnosis present

## 2019-03-01 DIAGNOSIS — E119 Type 2 diabetes mellitus without complications: Secondary | ICD-10-CM

## 2019-03-01 DIAGNOSIS — R7989 Other specified abnormal findings of blood chemistry: Secondary | ICD-10-CM

## 2019-03-01 DIAGNOSIS — Z9049 Acquired absence of other specified parts of digestive tract: Secondary | ICD-10-CM | POA: Diagnosis not present

## 2019-03-01 DIAGNOSIS — A419 Sepsis, unspecified organism: Secondary | ICD-10-CM | POA: Diagnosis not present

## 2019-03-01 DIAGNOSIS — Z7982 Long term (current) use of aspirin: Secondary | ICD-10-CM | POA: Diagnosis not present

## 2019-03-01 DIAGNOSIS — Z6841 Body Mass Index (BMI) 40.0 and over, adult: Secondary | ICD-10-CM | POA: Diagnosis not present

## 2019-03-01 DIAGNOSIS — F329 Major depressive disorder, single episode, unspecified: Secondary | ICD-10-CM | POA: Insufficient documentation

## 2019-03-01 DIAGNOSIS — F419 Anxiety disorder, unspecified: Secondary | ICD-10-CM | POA: Diagnosis not present

## 2019-03-01 HISTORY — DX: Hypothyroidism, unspecified: E03.9

## 2019-03-01 HISTORY — DX: Atherosclerotic heart disease of native coronary artery without angina pectoris: I25.10

## 2019-03-01 HISTORY — DX: Anxiety disorder, unspecified: F41.9

## 2019-03-01 MED ORDER — SODIUM CHLORIDE 0.9 % IV BOLUS
1000.0000 mL | Freq: Once | INTRAVENOUS | Status: AC
  Administered 2019-03-01: 1000 mL via INTRAVENOUS

## 2019-03-01 MED ORDER — MORPHINE SULFATE (PF) 4 MG/ML IV SOLN
4.0000 mg | Freq: Once | INTRAVENOUS | Status: AC
  Administered 2019-03-01: 4 mg via INTRAVENOUS
  Filled 2019-03-01: qty 1

## 2019-03-01 MED ORDER — IOHEXOL 300 MG/ML  SOLN
100.0000 mL | Freq: Once | INTRAMUSCULAR | Status: AC | PRN
  Administered 2019-03-02: 100 mL via INTRAVENOUS

## 2019-03-01 MED ORDER — LOPERAMIDE HCL 2 MG PO CAPS
4.0000 mg | ORAL_CAPSULE | Freq: Once | ORAL | Status: AC
  Administered 2019-03-02: 4 mg via ORAL
  Filled 2019-03-01: qty 2

## 2019-03-01 MED ORDER — ONDANSETRON HCL 4 MG/2ML IJ SOLN
4.0000 mg | Freq: Once | INTRAMUSCULAR | Status: AC
  Administered 2019-03-01: 4 mg via INTRAVENOUS
  Filled 2019-03-01: qty 2

## 2019-03-01 NOTE — ED Triage Notes (Signed)
Pt reports while sitting on the toilet she "passed out." EMS reports pt was hypotensive with systolic in the 37D upon arrival. Pt alert and oriented at this time.

## 2019-03-01 NOTE — ED Provider Notes (Signed)
[facility] EMERGENCY DEPARTMENT Provider Note   CSN: [id number] Arrival date & time: 03/01/19  2319    History   Chief Complaint Chief Complaint  Patient presents with  . Loss of Consciousness    HPI Kristin Bailey is a 58 y.o. female.  The history is provided by the patient.  Loss of Consciousness  She has history of diabetes, hypertension, diverticulitis, irritable bowel syndrome, depression and comes in following a syncopal episode at home.  This evening, she developed crampy abdominal pain, nausea, vomiting, diarrhea following eating dinner.  Pain started about 20 minutes after she ate.  She had vomited several times and had several loose to watery bowel movements.  She then got very sweaty and passed out.  This is happened to her several times before.  She states that this is typical of flareup of her diverticulitis.  She denies fever or chills.  Pain is rated 8/10.  She was not able to do anything at home for treatment.  EMS did note hypotension with blood pressure 70 systolic.  Past Medical History:  Diagnosis Date  . Depression   . Diabetes mellitus without complication ([place])   . Diverticulitis   . GERD (gastroesophageal reflux disease)   . Hypertension   . IBS (irritable bowel syndrome)   . Pneumonia   . Thyroid disease     Patient Active Problem List   Diagnosis Date Noted  . MDD (major depressive disorder), recurrent episode, mild ([place]) 09/19/2018  . Abdominal pain, left lower quadrant   . Diverticulitis 09/26/2017  . Hyponatremia 09/26/2017  . Noninfectious gastroenteritis 11/28/2016  . Enteritis 11/04/2016  . Lactic acidosis 11/04/2016  . Enterocolitis 03/17/2016  . Gastroenteritis, infectious 03/17/2016  . Leukocytosis 11/28/2015  . Acute respiratory failure with hypoxia ([place]) 11/28/2015  . Controlled type 2 diabetes mellitus without complication, without long-term current use of insulin ([place]) 11/28/2015  . UTI (lower urinary tract infection) 11/27/2015   . Colitis 11/27/2015  . AKI (acute kidney injury) ([place]) 11/27/2015    Past Surgical History:  Procedure Laterality Date  . AGILE CAPSULE N/A 11/23/2016   Procedure: AGILE CAPSULE;  Surgeon: Rogene Houston, MD;  Location: [facility] ENDO SUITE;  Service: Endoscopy;  Laterality: N/A;  8:30  . BREAST EXCISIONAL BIOPSY Right   . CARPAL TUNNEL RELEASE    . CESAREAN SECTION    . CHOLECYSTECTOMY    . COLONOSCOPY N/A 08/28/2014   Procedure: COLONOSCOPY;  Surgeon: Rogene Houston, MD;  Location: [facility];  Service: Endoscopy;  Laterality: N/A;  1030  . CYST REMOVAL HAND    . ESOPHAGOGASTRODUODENOSCOPY N/A 03/19/2016   Procedure: ESOPHAGOGASTRODUODENOSCOPY (EGD);  Surgeon: Rogene Houston, MD;  Location: [facility];  Service: Endoscopy;  Laterality: N/A;  . FLEXIBLE SIGMOIDOSCOPY N/A 09/28/2017   Procedure: FLEXIBLE SIGMOIDOSCOPY;  Surgeon: Rogene Houston, MD;  Location: [facility];  Service: Endoscopy;  Laterality: N/A;  . FRACTURE SURGERY     rt ankle   . GIVENS CAPSULE STUDY N/A 12/15/2016   Procedure: GIVENS CAPSULE STUDY;  Surgeon: Rogene Houston, MD;  Location: [facility];  Service: Endoscopy;  Laterality: N/A;  . KNEE SURGERY     meniscus repair  . TUBAL LIGATION       OB History    Gravida  1   Para  1   Term  1   Preterm      AB      Living  1     SAB  TAB      Ectopic      Multiple      Live Births               Home Medications    Prior to Admission medications   Medication Sig Start Date End Date Taking? Authorizing Provider  acetaminophen (TYLENOL 8 HOUR ARTHRITIS PAIN) 650 MG CR tablet Take 1,300 mg by mouth every morning. *May take additional as needed    [provider]  albuterol (PROVENTIL HFA;VENTOLIN HFA) 108 (90 BASE) MCG/ACT inhaler Inhale 2 puffs into the lungs every 4 (four) hours as needed for wheezing or shortness of breath.    [provider]  ALPRAZolam Duanne Moron) 0.25 MG tablet Take 1 tablet (0.25 mg total)  by mouth 2 (two) times daily as needed for anxiety. 01/07/19   Norman Clay, MD  Ascorbic Acid (VITAMIN C) 1000 MG tablet Take 1,000 mg by mouth as needed.     [provider]  aspirin EC 81 MG tablet Take 81 mg by mouth daily.    [provider]  ciprofloxacin (CIPRO) 500 MG tablet Take 1 tablet (500 mg total) by mouth 2 (two) times daily. 01/29/19   Setzer, Rona Ravens, NP  Dulaglutide (TRULICITY) 4.09 WJ/1.9JY SOPN Inject 0.5 mLs into the skin every Monday. On  Monday's.     [provider]  DULoxetine (CYMBALTA) 60 MG capsule Take 1 capsule (60 mg total) by mouth 2 (two) times daily. 09/19/18   Norman Clay, MD  glipiZIDE (GLUCOTROL) 5 MG tablet Take 5 mg by mouth 2 (two) times daily.     [provider]  HYDROcodone-acetaminophen (NORCO/VICODIN) 5-325 MG tablet Take 1 tablet by mouth every 6 (six) hours as needed for moderate pain. 01/29/19   Setzer, Rona Ravens, NP  levothyroxine (SYNTHROID, LEVOTHROID) 100 MCG tablet Take 100 mcg daily by mouth.  08/16/17   [provider]  lisinopril-hydrochlorothiazide (PRINZIDE,ZESTORETIC) 20-12.5 MG tablet Take 1 tablet daily by mouth. Resume in 3 days 09/29/17 09/23/18  Kathie Dike, MD  meclizine (ANTIVERT) 25 MG tablet Take 1 tablet (25 mg total) by mouth 3 (three) times daily as needed for dizziness. 07/11/18   Julianne Rice, MD  metoCLOPramide (REGLAN) 10 MG tablet Take 1 tablet (10 mg total) by mouth every 6 (six) hours as needed for nausea (or headache). 78/29/56   Delora Fuel, MD  omeprazole (PRILOSEC OTC) 20 MG tablet Take 1 tablet (20 mg total) by mouth daily. Patient taking differently: Take 20 mg by mouth every evening.  01/03/18   Rehman, Mechele Dawley, MD  oxyCODONE-acetaminophen (PERCOCET/ROXICET) 5-325 MG tablet Take 1 tablet by mouth every 4 (four) hours as needed. 12/13/18   Evalee Jefferson, PA-C  promethazine (PHENERGAN) 25 MG tablet Take 1 tablet (25 mg total) by mouth every 6 (six) hours as needed for nausea  or vomiting. 03/22/18   Ashley Murrain, NP  traZODone (DESYREL) 150 MG tablet Take 1 tablet (150 mg total) by mouth at bedtime as needed for sleep. 12/10/18   Norman Clay, MD    Family History Family History  Problem Relation Age of Onset  . Cancer Mother        colon cancer age74  . Hypertension Mother   . Stroke Mother   . Diabetes Mother   . Non-Hodgkin's lymphoma Father   . Hypertension Father   . Diabetes Father   . Stroke Maternal Grandmother   . Kidney disease Paternal Aunt   . Cancer Paternal Aunt   .  Kidney disease Paternal Uncle   . Cancer Paternal Uncle     Social History Social History   Tobacco Use  . Smoking status: Never Smoker  . Smokeless tobacco: Never Used  Substance Use Topics  . Alcohol use: No  . Drug use: No     Allergies   Patient has no known allergies.   Review of Systems Review of Systems  Cardiovascular: Positive for syncope.  All other systems reviewed and are negative.    Physical Exam Updated Vital Signs BP (!) 120/94 (BP Location: Left Wrist)   Pulse (!) 105   Temp 98.1 F (36.7 C) (Oral)   Resp 20   LMP 03/27/2014 Comment: irregular  SpO2 100%   Physical Exam Vitals signs and nursing note reviewed.    58 year old female, appears mildly uncomfortable, but is in no acute distress. Vital signs are significant for mildly elevated diastolic blood pressure and mild elevation of heart rate. Oxygen saturation is 100%, which is normal. Head is normocephalic and atraumatic. PERRLA, EOMI. Oropharynx is clear. Neck is nontender and supple without adenopathy or JVD. Back is nontender and there is no CVA tenderness. Lungs are clear without rales, wheezes, or rhonchi. Chest is nontender. Heart has regular rate and rhythm without murmur. Abdomen is soft, flat, with mild to moderate tenderness in the periumbilical area and left lower quadrant.  There is no rebound or guarding.  There are no masses or hepatosplenomegaly and peristalsis is  hypoactive. Extremities have no cyanosis or edema, full range of motion is present. Skin is warm and dry without rash. Neurologic: Mental status is normal, cranial nerves are intact, there are no motor or sensory deficits.  ED Treatments / Results  Labs (all labs ordered are listed, but only abnormal results are displayed) Labs Reviewed  COMPREHENSIVE METABOLIC PANEL - Abnormal; Notable for the following components:      Result Value   Glucose, Bld 226 (*)    Total Protein 8.3 (*)    All other components within normal limits  CBC WITH DIFFERENTIAL/PLATELET - Abnormal; Notable for the following components:   WBC 40.6 (*)    RBC 5.93 (*)    Hemoglobin 16.5 (*)    HCT 50.5 (*)    Platelets 427 (*)    Neutro Abs 34.8 (*)    Monocytes Absolute 1.5 (*)    Basophils Absolute 0.2 (*)    Abs Immature Granulocytes 0.70 (*)    All other components within normal limits  LACTIC ACID, PLASMA - Abnormal; Notable for the following components:   Lactic Acid, Venous 3.7 (*)    All other components within normal limits  LACTIC ACID, PLASMA - Abnormal; Notable for the following components:   Lactic Acid, Venous 2.4 (*)    All other components within normal limits  LIPASE, BLOOD  TROPONIN I    EKG EKG Interpretation  Date/Time:  Friday March 01 2019 23:27:30 EDT Ventricular Rate:  104 PR Interval:    QRS Duration: 103 QT Interval:  373 QTC Calculation: 491 R Axis:   71 Text Interpretation:  Sinus tachycardia Borderline repolarization abnormality Borderline prolonged QT interval Baseline wander in lead(s) V6 When compared with ECG of 09/18/2018, ST depression in Anterolateral leads is now present Confirmed by Delora Fuel ([id number]) on 03/01/2019 11:33:30 PM   Radiology Ct Abdomen Pelvis W Contrast  Result Date: 03/02/2019 CLINICAL DATA:  58 y/o  F; Abd pain, diverticulitis suspected. EXAM: CT ABDOMEN AND PELVIS WITH CONTRAST TECHNIQUE: Multidetector CT imaging  of the abdomen and pelvis was  performed using the standard protocol following bolus administration of intravenous contrast. CONTRAST:  113mL OMNIPAQUE IOHEXOL 300 MG/ML  SOLN COMPARISON:  02/01/2019 CT abdomen and pelvis. FINDINGS: Lower chest: No acute abnormality. Hepatobiliary: Hepatic steatosis. No focal liver abnormality is seen. Status post cholecystectomy. No biliary dilatation. Pancreas: Unremarkable. No pancreatic ductal dilatation or surrounding inflammatory changes. Spleen: Normal in size without focal abnormality. Adrenals/Urinary Tract: Normal adrenal glands. No focal kidney lesion. Stable tiny 2 mm stone within the left ureteropelvic junction. No hydronephrosis or ureter stone. Normal bladder. Stomach/Bowel: Diffuse wall thickening of the left hemicolon compatible with acute colitis, progressed from prior CT. Inflammation is greatest along the sigmoid segment where there is diverticulosis. No findings of perforation or abscess. No obstructive or inflammatory changes of stomach or small bowel. Vascular/Lymphatic: No significant vascular findings are present. No enlarged abdominal or pelvic lymph nodes. Reproductive: Uterus and bilateral adnexa are unremarkable. Other: No abdominal wall hernia or abnormality. No abdominopelvic ascites. Musculoskeletal: No fracture is seen. IMPRESSION: 1. Acute colitis involving the left hemicolon, progressed from prior CT. No evidence for perforation or abscess. Inflammation is greatest at the sigmoid segment where there is diverticulosis. 2. Hepatic steatosis. 3. Stable 2 mm stone within the left ureteropelvic junction. No hydronephrosis or ureter stone. Electronically Signed   By: Kristine Garbe M.D.   On: 03/02/2019 01:57    Procedures Procedures  CRITICAL CARE Performed by: Delora Fuel Total critical care time: 45 minutes Critical care time was exclusive of separately billable procedures and treating other patients. Critical care was necessary to treat or prevent imminent or  life-threatening deterioration. Critical care was time spent personally by me on the following activities: development of treatment plan with patient and/or surrogate as well as nursing, discussions with consultants, evaluation of patient's response to treatment, examination of patient, obtaining history from patient or surrogate, ordering and performing treatments and interventions, ordering and review of laboratory studies, ordering and review of radiographic studies, pulse oximetry and re-evaluation of patient's condition.  Medications Ordered in ED Medications  sodium chloride 0.9 % bolus 1,000 mL (has no administration in time range)  ondansetron (ZOFRAN) injection 4 mg (has no administration in time range)  morphine 4 MG/ML injection 4 mg (has no administration in time range)  iohexol (OMNIPAQUE) 300 MG/ML solution 100 mL (has no administration in time range)     Initial Impression / Assessment and Plan / ED Course  I have reviewed the triage vital signs and the nursing notes.  Pertinent labs & imaging results that were available during my care of the patient were reviewed by me and considered in my medical decision making (see chart for details).  Nausea, vomiting, diarrhea and pattern suggestive food poisoning, possible viral gastroenteritis.  Abdominal pain with history of diverticulitis, possible flareup of same.  Episode of syncope appears to have been a vasovagal episode.  She has normal blood pressure currently.  Old records are reviewed, and she does have several hospital admissions for enterocolitis.  She also has multiple visits for abdominal pain with diverticulitis diagnosed on several occasions.  We will give IV fluids, morphine, ondansetron.  ECG does show some ST depression, will check troponin and may need to check delta troponin.  She will be sent for CT of abdomen and pelvis to rule out flareup of diverticulitis.  Lactic acid level is come back high at 3.7, and patient is  started on fluid replacement.  ECG shows borderline ST depression in  anterolateral leads, minimally changed from previous ECG.  WBC is come back markedly elevated at 40.6 with slight left shift.  Metabolic panel was unremarkable except for mildly elevated glucose.  CT of abdomen and pelvis was obtained because of leukocytosis, and shows evidence of left-sided colitis which has progressed from recent CT scan.  Repeat lactic acid level has come down to 2.4.  She was started on ceftriaxone and metronidazole.  Case is discussed with Dr. Marlowe Sax of [facility], who agrees to admit the patient.  Final Clinical Impressions(s) / ED Diagnoses   Final diagnoses:  Colitis  Vasovagal syncope  Elevated lactic acid level  Leukocytosis, unspecified type    ED Discharge Orders    None       Delora Fuel, MD 07/68/08 [phone]

## 2019-03-02 ENCOUNTER — Encounter (HOSPITAL_COMMUNITY): Payer: Self-pay

## 2019-03-02 ENCOUNTER — Other Ambulatory Visit: Payer: Self-pay

## 2019-03-02 DIAGNOSIS — K529 Noninfective gastroenteritis and colitis, unspecified: Secondary | ICD-10-CM | POA: Diagnosis not present

## 2019-03-02 DIAGNOSIS — I10 Essential (primary) hypertension: Secondary | ICD-10-CM

## 2019-03-02 DIAGNOSIS — A419 Sepsis, unspecified organism: Secondary | ICD-10-CM

## 2019-03-02 DIAGNOSIS — R55 Syncope and collapse: Secondary | ICD-10-CM

## 2019-03-02 LAB — CBC WITH DIFFERENTIAL/PLATELET
Abs Immature Granulocytes: 0.7 10*3/uL — ABNORMAL HIGH (ref 0.00–0.07)
Basophils Absolute: 0.2 10*3/uL — ABNORMAL HIGH (ref 0.0–0.1)
Basophils Relative: 0 %
Eosinophils Absolute: 0 10*3/uL (ref 0.0–0.5)
Eosinophils Relative: 0 %
HCT: 50.5 % — ABNORMAL HIGH (ref 36.0–46.0)
Hemoglobin: 16.5 g/dL — ABNORMAL HIGH (ref 12.0–15.0)
Immature Granulocytes: 2 %
Lymphocytes Relative: 8 %
Lymphs Abs: 3.3 10*3/uL (ref 0.7–4.0)
MCH: 27.8 pg (ref 26.0–34.0)
MCHC: 32.7 g/dL (ref 30.0–36.0)
MCV: 85.2 fL (ref 80.0–100.0)
Monocytes Absolute: 1.5 10*3/uL — ABNORMAL HIGH (ref 0.1–1.0)
Monocytes Relative: 4 %
Neutro Abs: 34.8 10*3/uL — ABNORMAL HIGH (ref 1.7–7.7)
Neutrophils Relative %: 86 %
Platelets: 427 10*3/uL — ABNORMAL HIGH (ref 150–400)
RBC: 5.93 MIL/uL — ABNORMAL HIGH (ref 3.87–5.11)
RDW: 14.3 % (ref 11.5–15.5)
WBC: 40.6 10*3/uL — ABNORMAL HIGH (ref 4.0–10.5)
nRBC: 0 % (ref 0.0–0.2)

## 2019-03-02 LAB — HEMOGLOBIN A1C
Hgb A1c MFr Bld: 6.3 % — ABNORMAL HIGH (ref 4.8–5.6)
Mean Plasma Glucose: 134.11 mg/dL

## 2019-03-02 LAB — TROPONIN I
Troponin I: <0.03 ng/mL (ref <0.03)
Troponin I: <0.03 ng/mL (ref <0.03)
Troponin I: <0.03 ng/mL (ref <0.03)
Troponin I: <0.03 ng/mL (ref <0.03)

## 2019-03-02 LAB — CBC
HCT: 41.9 % (ref 36.0–46.0)
Hemoglobin: 13.4 g/dL (ref 12.0–15.0)
MCH: 27.4 pg (ref 26.0–34.0)
MCHC: 32 g/dL (ref 30.0–36.0)
MCV: 85.7 fL (ref 80.0–100.0)
Platelets: 318 10*3/uL (ref 150–400)
RBC: 4.89 MIL/uL (ref 3.87–5.11)
RDW: 14.4 % (ref 11.5–15.5)
WBC: 21.5 10*3/uL — ABNORMAL HIGH (ref 4.0–10.5)
nRBC: 0 % (ref 0.0–0.2)

## 2019-03-02 LAB — LACTIC ACID, PLASMA
Lactic Acid, Venous: 1.2 mmol/L (ref 0.5–1.9)
Lactic Acid, Venous: 2.4 mmol/L (ref 0.5–1.9)
Lactic Acid, Venous: 3.7 mmol/L (ref 0.5–1.9)

## 2019-03-02 LAB — COMPREHENSIVE METABOLIC PANEL
ALT: 30 U/L (ref 0–44)
AST: 26 U/L (ref 15–41)
Albumin: 4.6 g/dL (ref 3.5–5.0)
Alkaline Phosphatase: 70 U/L (ref 38–126)
Anion gap: 13 (ref 5–15)
BUN: 20 mg/dL (ref 6–20)
CO2: 24 mmol/L (ref 22–32)
Calcium: 9.5 mg/dL (ref 8.9–10.3)
Chloride: 99 mmol/L (ref 98–111)
Creatinine, Ser: 0.96 mg/dL (ref 0.44–1.00)
GFR calc Af Amer: >60 mL/min (ref >60)
GFR calc non Af Amer: >60 mL/min (ref >60)
Glucose, Bld: 226 mg/dL — ABNORMAL HIGH (ref 70–99)
Potassium: 3.5 mmol/L (ref 3.5–5.1)
Sodium: 136 mmol/L (ref 135–145)
Total Bilirubin: 0.6 mg/dL (ref 0.3–1.2)
Total Protein: 8.3 g/dL — ABNORMAL HIGH (ref 6.5–8.1)

## 2019-03-02 LAB — GLUCOSE, CAPILLARY
Glucose-Capillary: 122 mg/dL — ABNORMAL HIGH (ref 70–99)
Glucose-Capillary: 104 mg/dL — ABNORMAL HIGH (ref 70–99)
Glucose-Capillary: 81 mg/dL (ref 70–99)
Glucose-Capillary: 80 mg/dL (ref 70–99)
Glucose-Capillary: 87 mg/dL (ref 70–99)

## 2019-03-02 LAB — LIPASE, BLOOD: Lipase: 48 U/L (ref 11–51)

## 2019-03-02 MED ORDER — SODIUM CHLORIDE 0.9 % IV BOLUS
30.0000 mL/kg | Freq: Once | INTRAVENOUS | Status: AC
  Administered 2019-03-02: 1000 mL via INTRAVENOUS

## 2019-03-02 MED ORDER — SODIUM CHLORIDE 0.9 % IV SOLN
INTRAVENOUS | Status: DC
  Administered 2019-03-02 – 2019-03-03 (×2): via INTRAVENOUS

## 2019-03-02 MED ORDER — SODIUM CHLORIDE 0.9 % IV SOLN: 3.0000 g | Freq: Four times a day (QID) | INTRAVENOUS | Status: DC

## 2019-03-02 MED ORDER — SODIUM CHLORIDE 0.9 % IV SOLN: 2.0000 g | INTRAVENOUS | Status: DC

## 2019-03-02 MED ORDER — METRONIDAZOLE IN NACL 5-0.79 MG/ML-% IV SOLN
500.0000 mg | Freq: Once | INTRAVENOUS | Status: AC
  Administered 2019-03-02: 500 mg via INTRAVENOUS
  Filled 2019-03-02: qty 100

## 2019-03-02 MED ORDER — ACETAMINOPHEN 650 MG RE SUPP: 650.0000 mg | Freq: Four times a day (QID) | RECTAL | Status: DC | PRN

## 2019-03-02 MED ORDER — METRONIDAZOLE IN NACL 5-0.79 MG/ML-% IV SOLN: 500.0000 mg | Freq: Three times a day (TID) | INTRAVENOUS | Status: DC

## 2019-03-02 MED ORDER — TRAZODONE HCL 50 MG PO TABS
150.0000 mg | ORAL_TABLET | Freq: Once | ORAL | Status: AC
  Administered 2019-03-02: 150 mg via ORAL
  Filled 2019-03-02: qty 3

## 2019-03-02 MED ORDER — SODIUM CHLORIDE 0.9 % IV SOLN
INTRAVENOUS | Status: AC
  Administered 2019-03-02: 05:00:00 via INTRAVENOUS

## 2019-03-02 MED ORDER — SODIUM CHLORIDE 0.9 % IV SOLN
2.0000 g | Freq: Once | INTRAVENOUS | Status: AC
  Administered 2019-03-02: 2 g via INTRAVENOUS
  Filled 2019-03-02: qty 20

## 2019-03-02 MED ORDER — MORPHINE SULFATE (PF) 2 MG/ML IV SOLN: 1.0000 mg | INTRAVENOUS | Status: DC | PRN

## 2019-03-02 MED ORDER — ENOXAPARIN SODIUM 40 MG/0.4ML ~~LOC~~ SOLN
40.0000 mg | SUBCUTANEOUS | Status: DC
  Administered 2019-03-02 – 2019-03-03 (×2): 40 mg via SUBCUTANEOUS
  Filled 2019-03-02 (×2): qty 0.4

## 2019-03-02 MED ORDER — INSULIN ASPART 100 UNIT/ML ~~LOC~~ SOLN: 0.0000 [IU] | SUBCUTANEOUS | Status: DC

## 2019-03-02 MED ORDER — INSULIN ASPART 100 UNIT/ML ~~LOC~~ SOLN: 0.0000 [IU] | Freq: Three times a day (TID) | SUBCUTANEOUS | Status: DC

## 2019-03-02 MED ORDER — ACETAMINOPHEN 325 MG PO TABS
650.0000 mg | ORAL_TABLET | Freq: Four times a day (QID) | ORAL | Status: DC | PRN
  Administered 2019-03-02 – 2019-03-03 (×2): 650 mg via ORAL
  Filled 2019-03-02 (×2): qty 2

## 2019-03-02 MED ORDER — LOPERAMIDE HCL 2 MG PO CAPS: 2.0000 mg | ORAL_CAPSULE | ORAL | Status: DC | PRN

## 2019-03-02 MED ORDER — ONDANSETRON HCL 4 MG/2ML IJ SOLN: 4.0000 mg | Freq: Four times a day (QID) | INTRAMUSCULAR | Status: DC | PRN

## 2019-03-02 MED ORDER — SODIUM CHLORIDE 0.9 % IV SOLN
3.0000 g | Freq: Three times a day (TID) | INTRAVENOUS | Status: DC
  Administered 2019-03-02 – 2019-03-03 (×4): 3 g via INTRAVENOUS
  Filled 2019-03-02 (×7): qty 3

## 2019-03-02 NOTE — ED Notes (Signed)
Per Dr. Roxanne Mins pt to receive a total of 356ml NaCl.

## 2019-03-02 NOTE — ED Notes (Signed)
Patient transported to CT 

## 2019-03-02 NOTE — ED Notes (Signed)
MD at bedside. 

## 2019-03-02 NOTE — Progress Notes (Signed)
Patient seen and examined.  Admitted after midnight secondary to sepsis in the setting of diverticulitis.  No further episode of nausea/vomiting.  Still with intermittent episode of loose stools.  Currently afebrile.  Hemodynamically stable after initiated fluid resuscitation.  Please refer to H&P written by Dr. Marlowe Sax for further info/details on admission.  Plan: -Patient will be started on Unasyn -Advance diet to clear liquid -Continue as needed pain medication and antiemetics -Continue fluid resuscitation -Follow basic metabolic panel and CBC in the morning -Continue replacing electrolytes as needed.  Barton Dubois MD [phone]

## 2019-03-02 NOTE — ED Notes (Signed)
CRITICAL VALUE ALERT  Critical Value:  Lac 2.4  Date & Time Notied:  03/02/2019 0216  Provider Notified: Dr. Roxanne Mins  Orders Received/Actions taken: See chart

## 2019-03-02 NOTE — ED Notes (Signed)
Date and time results received: 03/02/19 0032 (use smartphrase ".now" to insert current time)  Test: Lactic Acid Critical Value: 3.7  Name of Provider Notified: Dr Roxanne Mins  Orders Received? Or Actions Taken?:

## 2019-03-02 NOTE — H&P (Addendum)
History and Physical    Kristin Bailey VOH:[id number] DOB: 28-Apr-1961 DOA: 03/01/2019  PCP: Celene Squibb, MD Patient coming from: Home  Chief Complaint: Syncope  HPI: Kristin Bailey is a 58 y.o. female with medical history significant of type 2 diabetes, hypertension, diverticulitis, IBS, GERD, hypothyroidism, depression presenting to the hospital via EMS for evaluation of syncope.  Per EMS, patient was hypotensive with systolic in the 69S. Patient states she had fried fish and baked potato for dinner last night and 20 minutes later started having vomiting and diarrhea.  She has had several episodes of nonbloody, bilious emesis.  In addition, she has had a few episodes of nonbloody, watery diarrhea.  She is having dull and sharp pain in her periumbilical region and left lower quadrant.  She passed out briefly at home while sitting on the toilet seat.  States she did not fall or sustain any injuries. Her husband told her she appeared pale and was sweating at that time.  Denies any chest pain.  Denies any shortness of breath, cough, sick contacts, recent travel, or exposure to any individual with confirmed COVID-19.  States she has had several episodes of colitis in the past and always the left side of the colon is involved.  Her gastroenterologist is Dr. Laural Golden.  Review of Systems: As per HPI otherwise 10 point review of systems negative.  Past Medical History:  Diagnosis Date  . Depression   . Diabetes mellitus without complication ([place])   . Diverticulitis   . GERD (gastroesophageal reflux disease)   . Hypertension   . IBS (irritable bowel syndrome)   . Pneumonia   . Thyroid disease     Past Surgical History:  Procedure Laterality Date  . AGILE CAPSULE N/A 11/23/2016   Procedure: AGILE CAPSULE;  Surgeon: Rogene Houston, MD;  [facility]: [facility];  Service: Endoscopy;  Laterality: N/A;  8:30  . BREAST EXCISIONAL BIOPSY Right   . CARPAL TUNNEL RELEASE    . CESAREAN SECTION    .  CHOLECYSTECTOMY    . COLONOSCOPY N/A 08/28/2014   Procedure: COLONOSCOPY;  Surgeon: Rogene Houston, MD;  [facility]: [facility];  Service: Endoscopy;  Laterality: N/A;  1030  . CYST REMOVAL HAND    . ESOPHAGOGASTRODUODENOSCOPY N/A 03/19/2016   Procedure: ESOPHAGOGASTRODUODENOSCOPY (EGD);  Surgeon: Rogene Houston, MD;  [facility];  Service: Endoscopy;  Laterality: N/A;  . FLEXIBLE SIGMOIDOSCOPY N/A 09/28/2017   Procedure: FLEXIBLE SIGMOIDOSCOPY;  Surgeon: Rogene Houston, MD;  [facility];  Service: Endoscopy;  Laterality: N/A;  . FRACTURE SURGERY     rt ankle   . GIVENS CAPSULE STUDY N/A 12/15/2016   Procedure: GIVENS CAPSULE STUDY;  Surgeon: Rogene Houston, MD;  [facility]: [facility];  Service: Endoscopy;  Laterality: N/A;  . KNEE SURGERY     meniscus repair  . TUBAL LIGATION       reports that she has never smoked. She has never used smokeless tobacco. She reports that she does not drink alcohol or use drugs.  No Known Allergies  Family History  Problem Relation Age of Onset  . Cancer Mother        colon cancer age74  . Hypertension Mother   . Stroke Mother   . Diabetes Mother   . Non-Hodgkin's lymphoma Father   . Hypertension Father   . Diabetes Father   . Stroke Maternal Grandmother   . Kidney disease Paternal Aunt   . Cancer  Paternal Aunt   . Kidney disease Paternal Uncle   . Cancer Paternal Uncle     Prior to Admission medications   Medication Sig Start Date End Date Taking? Authorizing Provider  acetaminophen (TYLENOL 8 HOUR ARTHRITIS PAIN) 650 MG CR tablet Take 1,300 mg by mouth every morning. *May take additional as needed    [provider]  albuterol (PROVENTIL HFA;VENTOLIN HFA) 108 (90 BASE) MCG/ACT inhaler Inhale 2 puffs into the lungs every 4 (four) hours as needed for wheezing or shortness of breath.    [provider]  ALPRAZolam Duanne Moron) 0.25 MG tablet Take 1 tablet (0.25 mg total) by mouth 2 (two) times daily as  needed for anxiety. 01/07/19   Norman Clay, MD  Ascorbic Acid (VITAMIN C) 1000 MG tablet Take 1,000 mg by mouth as needed.     [provider]  aspirin EC 81 MG tablet Take 81 mg by mouth daily.    [provider]  ciprofloxacin (CIPRO) 500 MG tablet Take 1 tablet (500 mg total) by mouth 2 (two) times daily. 01/29/19   Setzer, Rona Ravens, NP  Dulaglutide (TRULICITY) 6.14 ER/1.5QM SOPN Inject 0.5 mLs into the skin every Monday. On  Monday's.     [provider]  DULoxetine (CYMBALTA) 60 MG capsule Take 1 capsule (60 mg total) by mouth 2 (two) times daily. 09/19/18   Norman Clay, MD  glipiZIDE (GLUCOTROL) 5 MG tablet Take 5 mg by mouth 2 (two) times daily.     [provider]  HYDROcodone-acetaminophen (NORCO/VICODIN) 5-325 MG tablet Take 1 tablet by mouth every 6 (six) hours as needed for moderate pain. 01/29/19   Setzer, Rona Ravens, NP  levothyroxine (SYNTHROID, LEVOTHROID) 100 MCG tablet Take 100 mcg daily by mouth.  08/16/17   [provider]  lisinopril-hydrochlorothiazide (PRINZIDE,ZESTORETIC) 20-12.5 MG tablet Take 1 tablet daily by mouth. Resume in 3 days 09/29/17 09/23/18  Kathie Dike, MD  meclizine (ANTIVERT) 25 MG tablet Take 1 tablet (25 mg total) by mouth 3 (three) times daily as needed for dizziness. 07/11/18   Julianne Rice, MD  metoCLOPramide (REGLAN) 10 MG tablet Take 1 tablet (10 mg total) by mouth every 6 (six) hours as needed for nausea (or headache). 08/67/61   Delora Fuel, MD  omeprazole (PRILOSEC OTC) 20 MG tablet Take 1 tablet (20 mg total) by mouth daily. Patient taking differently: Take 20 mg by mouth every evening.  01/03/18   Rehman, Mechele Dawley, MD  oxyCODONE-acetaminophen (PERCOCET/ROXICET) 5-325 MG tablet Take 1 tablet by mouth every 4 (four) hours as needed. 12/13/18   Evalee Jefferson, PA-C  promethazine (PHENERGAN) 25 MG tablet Take 1 tablet (25 mg total) by mouth every 6 (six) hours as needed for nausea or vomiting. 03/22/18   Ashley Murrain, NP  traZODone (DESYREL) 150 MG tablet Take 1 tablet (150 mg total) by mouth at bedtime as needed for sleep. 12/10/18   Norman Clay, MD    Physical Exam: Vitals:   03/02/19 0200 03/02/19 0215 03/02/19 0230 03/02/19 0300  BP: 125/74  126/71 111/83  Pulse: 91 88 92 91  Resp: 17 (!) 21 (!) 23 (!) 21  Temp:      TempSrc:      SpO2: 95% 100% 94% 100%  Weight:        Physical Exam  Constitutional: She is oriented to person, place, and time. She appears well-developed and well-nourished. No distress.  HENT:  Head: Normocephalic.  Eyes: Right eye exhibits no discharge. Left eye  exhibits no discharge.  Neck: Neck supple.  Cardiovascular: Normal rate, regular rhythm and intact distal pulses.  Pulmonary/Chest: Effort normal and breath sounds normal. No respiratory distress. She has no wheezes. She has no rales.  Abdominal: Soft. Bowel sounds are normal. She exhibits no distension. There is abdominal tenderness. There is guarding. There is no rebound.  Generalized tenderness to palpation with guarding  Musculoskeletal:        General: No edema.  Neurological: She is alert and oriented to person, place, and time.  Skin: Skin is warm and dry. She is not diaphoretic.     Labs on Admission: I have personally reviewed following labs and imaging studies  CBC: Recent Labs  Lab 03/01/19 2356  WBC 40.6*  NEUTROABS 34.8*  HGB 16.5*  HCT 50.5*  MCV 85.2  PLT 093*   Basic Metabolic Panel: Recent Labs  Lab 03/01/19 2356  NA 136  K 3.5  CL 99  CO2 24  GLUCOSE 226*  BUN 20  CREATININE 0.96  CALCIUM 9.5   GFR: Estimated Creatinine Clearance: 82.6 mL/min (by C-G formula based on SCr of 0.96 mg/dL). Liver Function Tests: Recent Labs  Lab 03/01/19 2356  AST 26  ALT 30  ALKPHOS 70  BILITOT 0.6  PROT 8.3*  ALBUMIN 4.6   Recent Labs  Lab 03/01/19 2356  LIPASE 48   No results for input(s): AMMONIA in the last 168 hours. Coagulation Profile: No results for input(s):  INR, PROTIME in the last 168 hours. Cardiac Enzymes: Recent Labs  Lab 03/01/19 2356  TROPONINI <0.03   BNP (last 3 results) No results for input(s): PROBNP in the last 8760 hours. HbA1C: No results for input(s): HGBA1C in the last 72 hours. CBG: No results for input(s): GLUCAP in the last 168 hours. Lipid Profile: No results for input(s): CHOL, HDL, LDLCALC, TRIG, CHOLHDL, LDLDIRECT in the last 72 hours. Thyroid Function Tests: No results for input(s): TSH, T4TOTAL, FREET4, T3FREE, THYROIDAB in the last 72 hours. Anemia Panel: No results for input(s): VITAMINB12, FOLATE, FERRITIN, TIBC, IRON, RETICCTPCT in the last 72 hours. Urine analysis:    Component Value Date/Time   COLORURINE YELLOW 12/13/2018 0012   APPEARANCEUR CLEAR 12/13/2018 0012   LABSPEC 1.013 12/13/2018 0012   PHURINE 5.0 12/13/2018 0012   GLUCOSEU NEGATIVE 12/13/2018 0012   HGBUR MODERATE (A) 12/13/2018 0012   BILIRUBINUR NEGATIVE 12/13/2018 0012   KETONESUR NEGATIVE 12/13/2018 0012   PROTEINUR NEGATIVE 12/13/2018 0012   UROBILINOGEN 0.2 08/30/2015 1420   NITRITE NEGATIVE 12/13/2018 0012   LEUKOCYTESUR NEGATIVE 12/13/2018 0012    Radiological Exams on Admission: Ct Abdomen Pelvis W Contrast  Result Date: 03/02/2019 CLINICAL DATA:  58 y/o  F; Abd pain, diverticulitis suspected. EXAM: CT ABDOMEN AND PELVIS WITH CONTRAST TECHNIQUE: Multidetector CT imaging of the abdomen and pelvis was performed using the standard protocol following bolus administration of intravenous contrast. CONTRAST:  156mL OMNIPAQUE IOHEXOL 300 MG/ML  SOLN COMPARISON:  02/01/2019 CT abdomen and pelvis. FINDINGS: Lower chest: No acute abnormality. Hepatobiliary: Hepatic steatosis. No focal liver abnormality is seen. Status post cholecystectomy. No biliary dilatation. Pancreas: Unremarkable. No pancreatic ductal dilatation or surrounding inflammatory changes. Spleen: Normal in size without focal abnormality. Adrenals/Urinary Tract: Normal adrenal  glands. No focal kidney lesion. Stable tiny 2 mm stone within the left ureteropelvic junction. No hydronephrosis or ureter stone. Normal bladder. Stomach/Bowel: Diffuse wall thickening of the left hemicolon compatible with acute colitis, progressed from prior CT. Inflammation is greatest along the sigmoid segment where there  is diverticulosis. No findings of perforation or abscess. No obstructive or inflammatory changes of stomach or small bowel. Vascular/Lymphatic: No significant vascular findings are present. No enlarged abdominal or pelvic lymph nodes. Reproductive: Uterus and bilateral adnexa are unremarkable. Other: No abdominal wall hernia or abnormality. No abdominopelvic ascites. Musculoskeletal: No fracture is seen. IMPRESSION: 1. Acute colitis involving the left hemicolon, progressed from prior CT. No evidence for perforation or abscess. Inflammation is greatest at the sigmoid segment where there is diverticulosis. 2. Hepatic steatosis. 3. Stable 2 mm stone within the left ureteropelvic junction. No hydronephrosis or ureter stone. Electronically Signed   By: Kristine Garbe M.D.   On: 03/02/2019 01:57    EKG: Independently reviewed.  Sinus tachycardia (heart rate 104), baseline wander especially in lateral leads with questionable mild ST depression.  Assessment/Plan Principal Problem:   Colitis Active Problems:   Controlled type 2 diabetes mellitus without complication, without long-term current use of insulin (H[facility])   Severe sepsis ([facility])   Syncope   HTN (hypertension)   Severe sepsis secondary to colitis Presenting with complaints of nausea, vomiting, diarrhea, and abdominal pain.  Hypotensive per EMS.  Slightly tachycardic and tachypneic on arrival, resolved with IV fluid.  Afebrile.  Significant leukocytosis with white count 40.6 with left shift.  Lactic acid 3.7 > 2.4 with IV fluid.  Lipase and LFTs normal.  CT showing acute colitis involving the left hemicolon, progressed from  prior CT.  No evidence of perforation or abscess.  Inflammation is greatest at the sigmoid segment where there is diverticulosis. -Received 30 cc/kg IV fluid in the ED per sepsis protocol.  Blood pressure currently normal.  Continue maintenance fluid. -Received ceftriaxone and metronidazole in the ED.  Will continue. -IV morphine 1 mg every 3 hours as needed for pain -Zofran PRN nausea/vomiting -Loperamide PRN diarrhea -Blood cultures not drawn in the ED. Will order at this time. -Continue to trend lactic acid -Continue monitor CBC -Bowel rest -Consult GI in a.m.  Syncope Likely vasovagal. Systolic in the 16X per EMS.  EKG poor quality with questionable mild ST depression in lateral leads but difficult to read secondary to baseline wander.  Troponin negative.  Patient denies chest pain. -Cardiac monitoring -Repeat EKG -Continue to trend troponin -IV fluid hydration  Type 2 diabetes Blood glucose 226 on arrival.   -Check A1c.  Sliding scale insulin sensitive and CBG checks.  Polycythemia Hemoglobin 16.5, possibly due to hemoconcentration from dehydration. -Repeat CBC in a.m.  Mild thrombocytosis Platelet count 427.  Possibly secondary to hemoconcentration. -Repeat CBC in a.m.  Hypertension -Hold off giving any antihypertensives in the setting of sepsis and hypotension  Unable to safely order home medications at this time as pharmacy medication reconciliation is pending.  DVT prophylaxis: Lovenox Code Status: Full code Family Communication: No family available. Disposition Plan: Anticipate discharge after clinical improvement. Consults called: None Admission status: Observation, telemetry  This chart was dictated using voice recognition software.  Despite best efforts to proofread, errors can occur which can change the documentation meaning.  Shela Leff MD [facility] Pager [phone]  If 7PM-7AM, please contact night-coverage [email or web address] Password  [facility]  03/02/2019, 3:36 AM

## 2019-03-02 NOTE — Plan of Care (Signed)
  Problem: Fluid Volume: Goal: Hemodynamic stability will improve Outcome: Progressing   Problem: Clinical Measurements: Goal: Diagnostic test results will improve Outcome: Progressing   Problem: Clinical Measurements: Goal: Signs and symptoms of infection will decrease Outcome: Progressing   Problem: Respiratory: Goal: Ability to maintain adequate ventilation will improve Outcome: Progressing   

## 2019-03-03 DIAGNOSIS — R55 Syncope and collapse: Secondary | ICD-10-CM | POA: Diagnosis not present

## 2019-03-03 DIAGNOSIS — F418 Other specified anxiety disorders: Secondary | ICD-10-CM

## 2019-03-03 DIAGNOSIS — K219 Gastro-esophageal reflux disease without esophagitis: Secondary | ICD-10-CM

## 2019-03-03 DIAGNOSIS — R7989 Other specified abnormal findings of blood chemistry: Secondary | ICD-10-CM

## 2019-03-03 DIAGNOSIS — E119 Type 2 diabetes mellitus without complications: Secondary | ICD-10-CM

## 2019-03-03 DIAGNOSIS — I10 Essential (primary) hypertension: Secondary | ICD-10-CM

## 2019-03-03 DIAGNOSIS — A419 Sepsis, unspecified organism: Secondary | ICD-10-CM

## 2019-03-03 DIAGNOSIS — K529 Noninfective gastroenteritis and colitis, unspecified: Secondary | ICD-10-CM | POA: Diagnosis not present

## 2019-03-03 LAB — CBC
HCT: 38.1 % (ref 36.0–46.0)
Hemoglobin: 11.8 g/dL — ABNORMAL LOW (ref 12.0–15.0)
MCH: 27.2 pg (ref 26.0–34.0)
MCHC: 31 g/dL (ref 30.0–36.0)
MCV: 87.8 fL (ref 80.0–100.0)
Platelets: 239 10*3/uL (ref 150–400)
RBC: 4.34 MIL/uL (ref 3.87–5.11)
RDW: 14.4 % (ref 11.5–15.5)
WBC: 9.3 10*3/uL (ref 4.0–10.5)
nRBC: 0 % (ref 0.0–0.2)

## 2019-03-03 LAB — GLUCOSE, CAPILLARY
Glucose-Capillary: 83 mg/dL (ref 70–99)
Glucose-Capillary: 92 mg/dL (ref 70–99)
Glucose-Capillary: 97 mg/dL (ref 70–99)
Glucose-Capillary: 99 mg/dL (ref 70–99)

## 2019-03-03 LAB — BASIC METABOLIC PANEL
Anion gap: 7 (ref 5–15)
BUN: 6 mg/dL (ref 6–20)
CO2: 26 mmol/L (ref 22–32)
Calcium: 8.9 mg/dL (ref 8.9–10.3)
Chloride: 112 mmol/L — ABNORMAL HIGH (ref 98–111)
Creatinine, Ser: 0.56 mg/dL (ref 0.44–1.00)
GFR calc Af Amer: >60 mL/min (ref >60)
GFR calc non Af Amer: >60 mL/min (ref >60)
Glucose, Bld: 101 mg/dL — ABNORMAL HIGH (ref 70–99)
Potassium: 3.9 mmol/L (ref 3.5–5.1)
Sodium: 145 mmol/L (ref 135–145)

## 2019-03-03 LAB — HIV ANTIBODY (ROUTINE TESTING W REFLEX): HIV Screen 4th Generation wRfx: NONREACTIVE

## 2019-03-03 MED ORDER — LISINOPRIL-HYDROCHLOROTHIAZIDE 20-12.5 MG PO TABS: 1.0000 | ORAL_TABLET | Freq: Every day | ORAL | Status: DC

## 2019-03-03 MED ORDER — TRAZODONE HCL 50 MG PO TABS: 150.0000 mg | ORAL_TABLET | Freq: Every evening | ORAL | Status: DC | PRN

## 2019-03-03 MED ORDER — ALPRAZOLAM 0.5 MG PO TABS
0.5000 mg | ORAL_TABLET | Freq: Two times a day (BID) | ORAL | Status: DC | PRN
  Administered 2019-03-03: 0.5 mg via ORAL
  Filled 2019-03-03: qty 1

## 2019-03-03 MED ORDER — AMOXICILLIN-POT CLAVULANATE 875-125 MG PO TABS: 1.0000 | ORAL_TABLET | Freq: Two times a day (BID) | ORAL | 0 refills | Status: AC

## 2019-03-03 MED ORDER — PANTOPRAZOLE SODIUM 40 MG PO TBEC: 40.0000 mg | DELAYED_RELEASE_TABLET | Freq: Every day | ORAL | Status: DC

## 2019-03-03 MED ORDER — PSYLLIUM 0.52 G PO CAPS: 1.0000 g | ORAL_CAPSULE | Freq: Every day | ORAL | Status: DC | PRN

## 2019-03-03 MED ORDER — LORATADINE 10 MG PO TABS
10.0000 mg | ORAL_TABLET | Freq: Every day | ORAL | Status: DC
  Administered 2019-03-03: 10 mg via ORAL
  Filled 2019-03-03: qty 1

## 2019-03-03 MED ORDER — DULOXETINE HCL 60 MG PO CPEP
60.0000 mg | ORAL_CAPSULE | Freq: Two times a day (BID) | ORAL | Status: DC
  Administered 2019-03-03: 60 mg via ORAL
  Filled 2019-03-03: qty 1

## 2019-03-03 MED ORDER — ONDANSETRON 8 MG PO TBDP: 8.0000 mg | ORAL_TABLET | Freq: Three times a day (TID) | ORAL | 0 refills | Status: DC | PRN

## 2019-03-03 NOTE — Plan of Care (Signed)
  Problem: Fluid Volume: Goal: Hemodynamic stability will improve 03/03/2019 1519 by Rance Muir, RN Outcome: Adequate for Discharge 03/03/2019 0829 by Rance Muir, RN Outcome: Progressing   Problem: Clinical Measurements: Goal: Diagnostic test results will improve 03/03/2019 1519 by Rance Muir, RN Outcome: Adequate for Discharge 03/03/2019 0829 by Rance Muir, RN Outcome: Progressing Goal: Signs and symptoms of infection will decrease 03/03/2019 1519 by Rance Muir, RN Outcome: Adequate for Discharge 03/03/2019 0829 by Rance Muir, RN Outcome: Progressing   Problem: Respiratory: Goal: Ability to maintain adequate ventilation will improve 03/03/2019 1519 by Rance Muir, RN Outcome: Adequate for Discharge 03/03/2019 0829 by Rance Muir, RN Outcome: Progressing

## 2019-03-03 NOTE — Discharge Summary (Signed)
Physician Discharge Summary  Kristin Bailey ZOX:[id number] DOB: 03-01-1961 DOA: 03/01/2019  PCP: Celene Squibb, MD  Admit date: 03/01/2019 Discharge date: 03/03/2019  Time spent: 30 minutes  Recommendations for Outpatient Follow-up:  1. Repeat basic metabolic panel to follow electrolytes and renal function 2. Reassess blood pressure and adjust antihypertensive regimen as needed 3. Patient will need outpatient follow-up with general surgery to arrange elective partial colectomy.   Discharge Diagnoses:  Principal Problem:   Colitis Active Problems:   Depression with anxiety   Controlled type 2 diabetes mellitus without complication, without long-term current use of insulin (HCC)   Sepsis without acute organ dysfunction (HCC)   Syncope   HTN (hypertension)   Elevated lactic acid level   Obesity, Class III, BMI 40-49.9 (morbid obesity) ([place])   Gastroesophageal reflux disease   Discharge Condition: Stable and improved.  Patient discharged home with instruction to follow-up with PCP and general surgery as an outpatient.  Diet recommendation: Heart healthy/modified carbohydrate; mechanical soft.  Filed Weights   03/02/19 0052 03/02/19 0453  Weight: 113.4 kg 115.5 kg    History of present illness:  As per H&P written by Dr. Marlowe Sax on 03/02/2019 58 y.o. female with medical history significant of type 2 diabetes, hypertension, diverticulitis, IBS, GERD, hypothyroidism, depression presenting to the hospital via EMS for evaluation of syncope.  Per EMS, patient was hypotensive with systolic in the 81X. Patient states she had fried fish and baked potato for dinner last night and 20 minutes later started having vomiting and diarrhea.  She has had several episodes of nonbloody, bilious emesis.  In addition, she has had a few episodes of nonbloody, watery diarrhea.  She is having dull and sharp pain in her periumbilical region and left lower quadrant.  She passed out briefly at home while sitting  on the toilet seat.  States she did not fall or sustain any injuries. Her husband told her she appeared pale and was sweating at that time.  Denies any chest pain.  Denies any shortness of breath, cough, sick contacts, recent travel, or exposure to any individual with confirmed COVID-19.  States she has had several episodes of colitis in the past and always the left side of the colon is involved.  Her gastroenterologist is Dr. Laural Golden.  Hospital Course:  1-sepsis secondary to colitis -Patient met criteria for sepsis on admission -At time of discharge sepsis features were completely resolved -Patient will be discharged on Augmentin to complete 10 days of antibiotics -This is patient eighth episode of diverticulitis in the last 4 years; has been instructed to follow-up with general surgery as an outpatient to review the possibility for partial colectomy. -Continue PRN pain medication and antiemetics -Advised to maintain adequate hydration and to follow soft diet.  2-type 2 diabetes -Continue Trulicity and glipizide -Modified carbohydrate diet has been instructed.  3-essential hypertension -Patient blood pressure has remained stable and well-controlled without the use of antihypertensive agents. -Patient instructed to home medication and to follow-up with PCP to determine the need of antihypertensive agents and if that is the case, will need dose adjustments . -Advised to follow low-sodium diet -Patient will be monitoring her blood pressure at home to assist with future decisions.  4-gastroesophageal reflux disease -Continue PPI  5-anxiety/depression -Mood overall stable -Resume home anxiolytic and antidepressant medications. -No suicidal ideation or hallucinations.  6-morbid obesity -Low calorie diet, portion control physical activity discussed with patient -She is already very motivated and in the process of losing weight. -Body mass  index is 41.09 kg/m.   7-history of  hypothyroidism -Continue Synthroid.  Procedures:  See below for x-ray reports.  Consultations:  None  Discharge Exam: Vitals:   03/03/19 0651 03/03/19 1408  BP: (!) 110/53 116/67  Pulse: 75 69  Resp: 18 17  Temp: 98.2 F (36.8 C)   SpO2: 99% 100%    General: Afebrile, no chest pain, no shortness of breath, no nausea, no vomiting.  Reports still some intermittent episode of loose stools firming up.  Tolerating diet without any problems. Cardiovascular: S1 and S2, no rubs, no gallops, no murmurs. Respiratory: Clear to auscultation bilaterally Abdomen: Obese, soft, no guarding; still with left lower quadrant discomfort on deep palpation, positive bowel sounds. Extremities: No cyanosis, no clubbing.  Discharge Instructions   Discharge Instructions    Diet - low sodium heart healthy   Complete by:  As directed    Discharge instructions   Complete by:  As directed    Follow soft diet Maintain adequate hydration Take medications as prescribed Arrange follow-up with PCP in 1 week Outpatient follow-up with general surgery in 2 weeks.     Allergies as of 03/03/2019   No Known Allergies     Medication List    STOP taking these medications   metoCLOPramide 10 MG tablet Commonly known as:  REGLAN   oxyCODONE-acetaminophen 5-325 MG tablet Commonly known as:  PERCOCET/ROXICET     TAKE these medications   albuterol 108 (90 Base) MCG/ACT inhaler Commonly known as:  VENTOLIN HFA Inhale 2 puffs into the lungs every 4 (four) hours as needed for wheezing or shortness of breath.   ALPRAZolam 0.25 MG tablet Commonly known as:  XANAX Take 1 tablet (0.25 mg total) by mouth 2 (two) times daily as needed for anxiety.   amoxicillin-clavulanate 875-125 MG tablet Commonly known as:  Augmentin Take 1 tablet by mouth 2 (two) times daily for 10 days.   aspirin EC 81 MG tablet Take 81 mg by mouth daily.   DULoxetine 60 MG capsule Commonly known as:  CYMBALTA Take 1 capsule (60  mg total) by mouth 2 (two) times daily.   Fish Oil 1000 MG Caps Take 1,000 mg by mouth daily.   glipiZIDE 5 MG tablet Commonly known as:  GLUCOTROL Take 5 mg by mouth 2 (two) times daily.   HYDROcodone-acetaminophen 5-325 MG tablet Commonly known as:  NORCO/VICODIN Take 1 tablet by mouth every 6 (six) hours as needed for moderate pain.   levothyroxine 100 MCG tablet Commonly known as:  SYNTHROID Take 100 mcg daily by mouth.   lisinopril-hydrochlorothiazide 20-12.5 MG tablet Commonly known as:  ZESTORETIC Take 1 tablet by mouth daily. Hold until follow up with PCP What changed:  additional instructions   meclizine 25 MG tablet Commonly known as:  ANTIVERT Take 1 tablet (25 mg total) by mouth 3 (three) times daily as needed for dizziness.   omeprazole 20 MG tablet Commonly known as:  PRILOSEC OTC Take 1 tablet (20 mg total) by mouth daily. What changed:  when to take this   ondansetron 8 MG disintegrating tablet Commonly known as:  Zofran ODT Take 1 tablet (8 mg total) by mouth every 8 (eight) hours as needed for nausea or vomiting.   Pro-biotic Blend Caps Take by mouth.   promethazine 25 MG tablet Commonly known as:  PHENERGAN Take 1 tablet (25 mg total) by mouth every 6 (six) hours as needed for nausea or vomiting.   psyllium 0.52 g capsule Commonly known as:  REGULOID Take 2 capsules (1.04 g total) by mouth daily as needed (constipation). What changed:    how much to take  when to take this  reasons to take this   traZODone 150 MG tablet Commonly known as:  DESYREL Take 1 tablet (150 mg total) by mouth at bedtime as needed for sleep.   Trulicity 6.26 RS/8.5IO Sopn Generic drug:  Dulaglutide Inject 0.5 mLs into the skin every Sunday. On  Monday's.   Tylenol 8 Hour Arthritis Pain 650 MG CR tablet Generic drug:  acetaminophen Take 1,300 mg by mouth every morning. *May take additional as needed   vitamin C 1000 MG tablet Take 1,000 mg by mouth as  needed.      No Known Allergies Follow-up Information    Celene Squibb, MD. Schedule an appointment as soon as possible for a visit in 1 week(s).   Specialty:  Internal Medicine Contact information: [facility] [id number] [phone]        Virl Cagey, MD. Schedule an appointment as soon as possible for a visit in 2 week(s).   Specialty:  General Surgery Contact information: [address] [place] [facility] [address] [phone]           The results of significant diagnostics from this hospitalization (including imaging, microbiology, ancillary and laboratory) are listed below for reference.    Significant Diagnostic Studies: Ct Abdomen Pelvis W Contrast  Result Date: 03/02/2019 CLINICAL DATA:  58 y/o  F; Abd pain, diverticulitis suspected. EXAM: CT ABDOMEN AND PELVIS WITH CONTRAST TECHNIQUE: Multidetector CT imaging of the abdomen and pelvis was performed using the standard protocol following bolus administration of intravenous contrast. CONTRAST:  116m OMNIPAQUE IOHEXOL 300 MG/ML  SOLN COMPARISON:  02/01/2019 CT abdomen and pelvis. FINDINGS: Lower chest: No acute abnormality. Hepatobiliary: Hepatic steatosis. No focal liver abnormality is seen. Status post cholecystectomy. No biliary dilatation. Pancreas: Unremarkable. No pancreatic ductal dilatation or surrounding inflammatory changes. Spleen: Normal in size without focal abnormality. Adrenals/Urinary Tract: Normal adrenal glands. No focal kidney lesion. Stable tiny 2 mm stone within the left ureteropelvic junction. No hydronephrosis or ureter stone. Normal bladder. Stomach/Bowel: Diffuse wall thickening of the left hemicolon compatible with acute colitis, progressed from prior CT. Inflammation is greatest along the sigmoid segment where there is diverticulosis. No findings of perforation or abscess. No obstructive or inflammatory changes of stomach or small bowel. Vascular/Lymphatic: No significant  vascular findings are present. No enlarged abdominal or pelvic lymph nodes. Reproductive: Uterus and bilateral adnexa are unremarkable. Other: No abdominal wall hernia or abnormality. No abdominopelvic ascites. Musculoskeletal: No fracture is seen. IMPRESSION: 1. Acute colitis involving the left hemicolon, progressed from prior CT. No evidence for perforation or abscess. Inflammation is greatest at the sigmoid segment where there is diverticulosis. 2. Hepatic steatosis. 3. Stable 2 mm stone within the left ureteropelvic junction. No hydronephrosis or ureter stone. Electronically Signed   By: LKristine GarbeM.D.   On: 03/02/2019 01:57    Microbiology: Recent Results (from the past 240 hour(s))  Culture, blood (routine x 2)     Status: None (Preliminary result)   Collection Time: 03/02/19  1:38 AM  Result Value Ref Range Status   Specimen Description BLOOD  Final   Special Requests NONE  Final   Culture   Final    NO GROWTH 1 DAY [facility] [address] [place] [id number]   Report Status PENDING  Incomplete  Culture, blood (routine x  2)     Status: None (Preliminary result)   Collection Time: 03/02/19  5:28 AM  Result Value Ref Range Status   Specimen Description LEFT ANTECUBITAL  Final   Special Requests   Final    BOTTLES DRAWN AEROBIC AND ANAEROBIC Blood Culture adequate volume   Culture   Final    NO GROWTH 1 DAY Performed at [facility], [address], [place], [place] [address]    Report Status PENDING  Incomplete     Labs: Basic Metabolic Panel: Recent Labs  Lab 03/01/19 2356 03/03/19 0625  NA 136 145  K 3.5 3.9  CL 99 112*  CO2 24 26  GLUCOSE 226* 101*  BUN 20 6  CREATININE 0.96 0.56  CALCIUM 9.5 8.9   Liver Function Tests: Recent Labs  Lab 03/01/19 2356  AST 26  ALT 30  ALKPHOS 70  BILITOT 0.6  PROT 8.3*  ALBUMIN 4.6   Recent Labs  Lab 03/01/19 2356  LIPASE 48   CBC: Recent Labs  Lab 03/01/19 2356  03/02/19 0528 03/03/19 0625  WBC 40.6* 21.5* 9.3  NEUTROABS 34.8*  --   --   HGB 16.5* 13.4 11.8*  HCT 50.5* 41.9 38.1  MCV 85.2 85.7 87.8  PLT 427* 318 239   Cardiac Enzymes: Recent Labs  Lab 03/01/19 2356 03/02/19 0527 03/02/19 0925 03/02/19 1520  TROPONINI <0.03 <0.03 <0.03 <0.03   CBG: Recent Labs  Lab 03/02/19 2021 03/03/19 0001 03/03/19 0334 03/03/19 0722 03/03/19 1118  GLUCAP 87 83 97 92 99    Signed:  Barton Dubois MD.  [facility] 03/03/2019, 3:13 PM

## 2019-03-03 NOTE — Plan of Care (Signed)

## 2019-03-06 ENCOUNTER — Other Ambulatory Visit (HOSPITAL_COMMUNITY): Payer: Self-pay | Admitting: Psychiatry

## 2019-03-06 ENCOUNTER — Telehealth (HOSPITAL_COMMUNITY): Payer: Self-pay | Admitting: *Deleted

## 2019-03-06 MED ORDER — TRAZODONE HCL 150 MG PO TABS: 150.0000 mg | ORAL_TABLET | Freq: Every evening | ORAL | 0 refills | Status: DC | PRN

## 2019-03-06 NOTE — Telephone Encounter (Signed)
Dr Modesta Messing Patient called to schedule appointment with front office 03-12-2019. And then transferred to me & she requested refill on Trazodone stated she has 4 pills left

## 2019-03-06 NOTE — Progress Notes (Signed)
Virtual Visit via Telephone Note  I connected with Hoover Browns on 03/13/19 at  8:30 AM EDT by telephone and verified that I am speaking with the correct person using two identifiers.   I discussed the limitations, risks, security and privacy concerns of performing an evaluation and management service by telephone and the availability of in person appointments. I also discussed with the patient that there may be a patient responsible charge related to this service. The patient expressed understanding and agreed to proceed.     I discussed the assessment and treatment plan with the patient. The patient was provided an opportunity to ask questions and all were answered. The patient agreed with the plan and demonstrated an understanding of the instructions.   The patient was advised to call back or seek an in-person evaluation if the symptoms worsen or if the condition fails to improve as anticipated.  I provided 15 minutes of non-face-to-face time during this encounter.   Norman Clay, MD    [facility] MD/PA/NP OP Progress Note  03/13/2019 9:07 AM PARVEEN FREEHLING  MRN:  [id number]  Chief Complaint:  Chief Complaint    Follow-up; Depression     HPI:  - She was brought via EMS for syncope in the setting of hypotension with sepsis secondary to colitis This is a follow-up visit for depression.  She states that she was admitted for colitis.  She was told by the provider that she may need to have colostomy.  Although she was very comfortable with the surgeon she saw, her family wants her to get second opinion.  Although she feels fine to see another provider, she feels ambivalent as it will like take for several months to see a provider.  She also feels scared of having colostomy as she saw her mother had it due to cancer. She agrees that there would be always some wonder/question if she were to choose among options, and also agrees that she wants to make decision for herself.  She believes that she  has been handling things well, although she feels anxious about the situation.  She sleeps well with trazodone.  Denies feeling fatigue.  Denies feeling depressed except that she had crying spells when her medication was discontinued during the recent admission.  She has fair concentration.  She has nausea, which she attributes to her physical condition.  She denies panic attacks. The last time she took Xanax was 2 weeks ago.  She denies SI.    Xanax filled on 02/15/2019  Visit Diagnosis:    ICD-10-CM   1. MDD (major depressive disorder), recurrent, in partial remission ([place]) F33.41     Past Psychiatric History: Please see initial evaluation for full details. I have reviewed the history. No updates at this time.     Past Medical History:  Past Medical History:  Diagnosis Date  . Anxiety   . Arthritis   . Coronary artery disease   . Depression   . Diabetes mellitus without complication ([place])   . Diverticulitis   . GERD (gastroesophageal reflux disease)   . Hypertension   . Hypothyroidism   . IBS (irritable bowel syndrome)   . Pneumonia   . Thyroid disease     Past Surgical History:  Procedure Laterality Date  . AGILE CAPSULE N/A 11/23/2016   Procedure: AGILE CAPSULE;  Surgeon: Rogene Houston, MD;  [facility];  Service: Endoscopy;  Laterality: N/A;  8:30  . BREAST EXCISIONAL BIOPSY Right   . CARPAL  TUNNEL RELEASE    . CESAREAN SECTION    . CHOLECYSTECTOMY    . COLONOSCOPY N/A 08/28/2014   Procedure: COLONOSCOPY;  Surgeon: Rogene Houston, MD;  [facility];  Service: Endoscopy;  Laterality: N/A;  1030  . CYST REMOVAL HAND    . ESOPHAGOGASTRODUODENOSCOPY N/A 03/19/2016   Procedure: ESOPHAGOGASTRODUODENOSCOPY (EGD);  Surgeon: Rogene Houston, MD;  [facility];  Service: Endoscopy;  Laterality: N/A;  . FLEXIBLE SIGMOIDOSCOPY N/A 09/28/2017   Procedure: FLEXIBLE SIGMOIDOSCOPY;  Surgeon: Rogene Houston, MD;  [facility];  Service:  Endoscopy;  Laterality: N/A;  . FRACTURE SURGERY     rt ankle   . GIVENS CAPSULE STUDY N/A 12/15/2016   Procedure: GIVENS CAPSULE STUDY;  Surgeon: Rogene Houston, MD;  [facility];  Service: Endoscopy;  Laterality: N/A;  . KNEE SURGERY     meniscus repair  . TUBAL LIGATION      Family Psychiatric History: Please see initial evaluation for full details. I have reviewed the history. No updates at this time.     Family History:  Family History  Problem Relation Age of Onset  . Cancer Mother        colon cancer age74  . Hypertension Mother   . Stroke Mother   . Diabetes Mother   . Non-Hodgkin's lymphoma Father   . Hypertension Father   . Diabetes Father   . Stroke Maternal Grandmother   . Kidney disease Paternal Aunt   . Cancer Paternal Aunt   . Kidney disease Paternal Uncle   . Cancer Paternal Uncle     Social History:  Social History   Socioeconomic History  . Marital status: Married    Spouse name: Not on file  . Number of children: Not on file  . Years of education: Not on file  . Highest education level: Not on file  Occupational History  . Not on file  Social Needs  . Financial resource strain: Not hard at all  . Food insecurity:    Worry: Never true    Inability: Never true  . Transportation needs:    Medical: No    Non-medical: No  Tobacco Use  . Smoking status: Never Smoker  . Smokeless tobacco: Never Used  Substance and Sexual Activity  . Alcohol use: No  . Drug use: No  . Sexual activity: Yes    Birth control/protection: Surgical  Lifestyle  . Physical activity:    Days per week: 0 days    Minutes per session: 0 min  . Stress: To some extent  Relationships  . Social connections:    Talks on phone: More than three times a week    Gets together: Once a week    Attends religious service: 1 to 4 times per year    Active member of club or organization: No    Attends meetings of clubs or organizations: Never    Relationship status:  Married  Other Topics Concern  . Not on file  Social History Narrative   Works with special needs adults.    Allergies: No Known Allergies  Metabolic Disorder Labs: Lab Results  Component Value Date   HGBA1C 6.3 (H) 03/02/2019   MPG 134.11 03/02/2019   MPG 140 03/16/2016   No results found for: PROLACTIN No results found for: CHOL, TRIG, HDL, CHOLHDL, VLDL, LDLCALC Lab Results  Component Value Date   TSH 1.49 02/21/2018   TSH 5.142 (H) 09/27/2017  Therapeutic Level Labs: No results found for: LITHIUM No results found for: VALPROATE No components found for:  CBMZ  Current Medications: Current Outpatient Medications  Medication Sig Dispense Refill  . acetaminophen (TYLENOL 8 HOUR ARTHRITIS PAIN) 650 MG CR tablet Take 1,300 mg by mouth every morning. *May take additional as needed    . albuterol (PROVENTIL HFA;VENTOLIN HFA) 108 (90 BASE) MCG/ACT inhaler Inhale 2 puffs into the lungs every 4 (four) hours as needed for wheezing or shortness of breath.    . ALPRAZolam (XANAX) 0.25 MG tablet Take 1 tablet (0.25 mg total) by mouth 2 (two) times daily as needed for anxiety. 30 tablet 0  . amoxicillin-clavulanate (AUGMENTIN) 875-125 MG tablet Take 1 tablet by mouth 2 (two) times daily for 10 days. 28 tablet 0  . Ascorbic Acid (VITAMIN C) 1000 MG tablet Take 1,000 mg by mouth as needed.     Marland Kitchen aspirin EC 81 MG tablet Take 81 mg by mouth daily.    . DULoxetine (CYMBALTA) 60 MG capsule Take 1 capsule (60 mg total) by mouth 2 (two) times daily. 180 capsule 0  . glipiZIDE (GLUCOTROL) 5 MG tablet Take 5 mg by mouth 2 (two) times daily.     Marland Kitchen levothyroxine (SYNTHROID, LEVOTHROID) 100 MCG tablet Take 100 mcg daily by mouth.   1  . lisinopril-hydrochlorothiazide (ZESTORETIC) 20-12.5 MG tablet Take 1 tablet by mouth daily. Hold until follow up with PCP (Patient not taking: Reported on 03/12/2019)    . meclizine (ANTIVERT) 25 MG tablet Take 1 tablet (25 mg total) by mouth 3 (three) times daily  as needed for dizziness. (Patient not taking: Reported on 03/12/2019) 30 tablet 0  . Omega-3 Fatty Acids (FISH OIL) 1000 MG CAPS Take 1,000 mg by mouth daily.    Marland Kitchen omeprazole (PRILOSEC OTC) 20 MG tablet Take 1 tablet (20 mg total) by mouth daily. (Patient taking differently: Take 20 mg by mouth every evening. ) 20 tablet 5  . ondansetron (ZOFRAN ODT) 8 MG disintegrating tablet Take 1 tablet (8 mg total) by mouth every 8 (eight) hours as needed for nausea or vomiting. 20 tablet 0  . Probiotic Product (PRO-BIOTIC BLEND) CAPS Take by mouth.    . promethazine (PHENERGAN) 25 MG tablet Take 1 tablet (25 mg total) by mouth every 6 (six) hours as needed for nausea or vomiting. 30 tablet 0  . psyllium (REGULOID) 0.52 g capsule Take 2 capsules (1.04 g total) by mouth daily as needed (constipation).    . traZODone (DESYREL) 150 MG tablet Take 1 tablet (150 mg total) by mouth at bedtime as needed for sleep. 90 tablet 0   No current facility-administered medications for this visit.      Musculoskeletal: Strength & Muscle Tone: N/A Gait & Station: N/A Patient leans: N/A  Psychiatric Specialty Exam: Review of Systems  Psychiatric/Behavioral: Negative for depression, hallucinations, memory loss, substance abuse and suicidal ideas. The patient is nervous/anxious and has insomnia.   All other systems reviewed and are negative.   Last menstrual period 03/27/2014.There is no height or weight on file to calculate BMI.  General Appearance: NA  Eye Contact:  NA  Speech:  Clear and Coherent  Volume:  Normal  Mood:  Anxious  Affect:  NA  Thought Process:  Coherent  Orientation:  Full (Time, Place, and Person)  Thought Content: Logical   Suicidal Thoughts:  No  Homicidal Thoughts:  No  Memory:  Immediate;   Good  Judgement:  Good  Insight:  Good  Psychomotor Activity:  Normal  Concentration:  Concentration: Good and Attention Span: Good  Recall:  Good  Fund of Knowledge: Good  Language: Good   Akathisia:  No  Handed:  Right  AIMS (if indicated): not done  Assets:  Communication Skills Desire for Improvement  ADL's:  Intact  Cognition: WNL  Sleep:  Good on trazodone   Screenings:   Assessment and Plan:  KATHEREN JIMMERSON is a 58 y.o. year old female with a history of depression, hypothyroidism, hypertension, IBS,diabetes, sciatica , who presents for follow up appointment for MDD (major depressive disorder), recurrent, in partial remission ([place])  # MDD, recurrent in partial remission Patient reports overall stable mood despite potential upcoming colostomy.  Will continue duloxetine to target depression.  Will continue trazodone as needed for insomnia.  Will continue Xanax as needed for anxiety.  Provided problem-solving therapy in the context of her questioning whether or not to pursue second opinion.   Plan I have reviewed and updated plans as below 1. Continueduloxetine 60 mg twice a day- [facility], has six months refill according to the patient 2.Continuetrazodone 150- 225mg  at night as needed for sleep 3. Continue Xanax0.25 mg up to twice a day as needed for anxiety (she takes two tabs per week or less) Next appointment 7/20 at 9 AM for 20 mins  Past trials of medication: fluoxetine, Effexor, duloxetine,buspar (unable to tolerate),Xanax,  The patient demonstrates the following risk factors for suicide: Chronic risk factors for suicide include: psychiatric disorder of depressionand chronic pain. Acute risk factorsfor suicide include: family or marital conflict. Protective factorsfor this patient include: responsibility to others (children, family), coping skills and hope for the future. Considering these factors, the overall suicide risk at this point appears to be low. Patient isappropriate for outpatient follow up.  Norman Clay, MD 03/13/2019, 9:07 AM

## 2019-03-06 NOTE — Telephone Encounter (Signed)
ordered

## 2019-03-07 LAB — CULTURE, BLOOD (ROUTINE X 2)
Culture: NO GROWTH
Culture: NO GROWTH
Special Requests: ADEQUATE
Special Requests: ADEQUATE

## 2019-03-11 ENCOUNTER — Ambulatory Visit (HOSPITAL_COMMUNITY): Payer: Self-pay | Admitting: Psychiatry

## 2019-03-12 ENCOUNTER — Other Ambulatory Visit: Payer: Self-pay

## 2019-03-12 ENCOUNTER — Ambulatory Visit (INDEPENDENT_AMBULATORY_CARE_PROVIDER_SITE_OTHER): Payer: 59 | Admitting: General Surgery

## 2019-03-12 ENCOUNTER — Encounter: Payer: Self-pay | Admitting: General Surgery

## 2019-03-12 VITALS — BP 142/83 | HR 76 | Temp 98.4°F | Resp 18 | Wt 255.0 lb

## 2019-03-12 DIAGNOSIS — K5792 Diverticulitis of intestine, part unspecified, without perforation or abscess without bleeding: Secondary | ICD-10-CM | POA: Diagnosis not present

## 2019-03-12 NOTE — Patient Instructions (Signed)
Finish your antibiotics as prescribed. Try over the counter stool softener, colace, twice daily to aid with softening your BMs and take miralax as needed if no BM that day.  Stay hydrated, drinking 64 ounces of water a day will keep your stools softer. Call with any concerns or worsening symptoms.   Diverticulitis  Diverticulitis is when small pockets in your large intestine (colon) get infected or swollen. This causes stomach pain and watery poop (diarrhea). These pouches are called diverticula. They form in people who have a condition called diverticulosis. Follow these instructions at home: Medicines  Take over-the-counter and prescription medicines only as told by your doctor. These include: ? Antibiotics. ? Pain medicines. ? Fiber pills. ? Probiotics. ? Stool softeners.  Do not drive or use heavy machinery while taking prescription pain medicine.  If you were prescribed an antibiotic, take it as told. Do not stop taking it even if you feel better. General instructions   Follow a diet as told by your doctor.  When you feel better, your doctor may tell you to change your diet. You may need to eat a lot of fiber. Fiber makes it easier to poop (have bowel movements). Healthy foods with fiber include: ? Berries. ? Beans. ? Lentils. ? Green vegetables.  Exercise 3 or more times a week. Aim for 30 minutes each time. Exercise enough to sweat and make your heart beat faster.  Keep all follow-up visits as told. This is important. You may need to have an exam of the large intestine. This is called a colonoscopy. Contact a doctor if:  Your pain does not get better.  You have a hard time eating or drinking.  You are not pooping like normal. Get help right away if:  Your pain gets worse.  Your problems do not get better.  Your problems get worse very fast.  You have a fever.  You throw up (vomit) more than one time.  You have poop that is: ? Bloody. ? Black. ? Tarry.  Summary  Diverticulitis is when small pockets in your large intestine (colon) get infected or swollen.  Take medicines only as told by your doctor.  Follow a diet as told by your doctor. This information is not intended to replace advice given to you by your health care provider. Make sure you discuss any questions you have with your health care provider. Document Released: 04/18/2008 Document Revised: 11/17/2016 Document Reviewed: 11/17/2016 Elsevier Interactive Patient Education  2019 [employer].

## 2019-03-12 NOTE — Progress Notes (Signed)
[facility] History and Physical  Reason for Referral: Diverticulitis/ colitis  Referring Physician:  Dr. Dyann Kief (hospitalist), Dr. Laural Golden (GI)   Chief Complaint    Pre-op Exam      Kristin Bailey is a 58 y.o. female.  HPI: Kristin Bailey is a 58 yo who is known to me from 09/2017. She has a history of GERD, HTN, DM, IBS with reported chronic nausea, intermittent diarrhea and constipation and attacks of abdominal pain with nausea/vomiting, diarrhea that have been called colitis versus diverticulitis.  She has had reportedly 6 episodes of this left sided pain with some hospitalizations but says that 2019 was better for her and she had fewer attacks. She says that she has suffered from IBS for over 30 years and has followed with Dr. Laural Golden but in the last 3 years with these "attacks" her symptoms are worse with left sided pain and vomiting. She has undergone extensive testing with Dr. Laural Golden including gastric emptying in 2017 which was normal, barium enema 10/2017 that showed diverticula, and colonoscopy in 2015 with diverticula in the sigmoid colon, EGD In 2017 with some benign gastric polyps and otherwise normal, and sigmoidoscopy 09/2017 that showed some mild colitis and wide mouthed diverticula.  She has been referred to discuss the option of a sigmoid colectomy given the repeat episodes that are most consistent with diverticulitis with regards to her left sided pain and inflammatory changes on CT. Her mother had a colon cancer and had emergency surgery with colostomy per the patient's report.   She says she has been having diarrhea with the antibiotics and took imodium and has since had harder stools daily. She has been taking miralax. She also reports some new urinary incontinence but no gas or sediment in there urine.  She says that she is incontinent at night and when she has held her urine for a long time. She has been told she had a "spastic bladder" in the past. She says she  has had some fecal incontinence with diarrhea in the past.   Past Medical History:  Diagnosis Date  . Anxiety   . Arthritis   . Coronary artery disease   . Depression   . Diabetes mellitus without complication ([place])   . Diverticulitis   . GERD (gastroesophageal reflux disease)   . Hypertension   . Hypothyroidism   . IBS (irritable bowel syndrome)   . Pneumonia   . Thyroid disease     Past Surgical History:  Procedure Laterality Date  . AGILE CAPSULE N/A 11/23/2016   Procedure: AGILE CAPSULE;  Surgeon: Rogene Houston, MD;  Location: [facility];  Service: Endoscopy;  Laterality: N/A;  8:30  . BREAST EXCISIONAL BIOPSY Right   . CARPAL TUNNEL RELEASE    . CESAREAN SECTION    . CHOLECYSTECTOMY    . COLONOSCOPY N/A 08/28/2014   Procedure: COLONOSCOPY;  Surgeon: Rogene Houston, MD;  Location: [facility];  Service: Endoscopy;  Laterality: N/A;  1030  . CYST REMOVAL HAND    . ESOPHAGOGASTRODUODENOSCOPY N/A 03/19/2016   Procedure: ESOPHAGOGASTRODUODENOSCOPY (EGD);  Surgeon: Rogene Houston, MD;  Location: [facility];  Service: Endoscopy;  Laterality: N/A;  . FLEXIBLE SIGMOIDOSCOPY N/A 09/28/2017   Procedure: FLEXIBLE SIGMOIDOSCOPY;  Surgeon: Rogene Houston, MD;  Location: [facility];  Service: Endoscopy;  Laterality: N/A;  . FRACTURE SURGERY     rt ankle   . GIVENS CAPSULE STUDY N/A 12/15/2016   Procedure: GIVENS CAPSULE STUDY;  Surgeon:  Rogene Houston, MD;  Location: [facility];  Service: Endoscopy;  Laterality: N/A;  . KNEE SURGERY     meniscus repair  . TUBAL LIGATION      Family History  Problem Relation Age of Onset  . Cancer Mother        colon cancer age74  . Hypertension Mother   . Stroke Mother   . Diabetes Mother   . Non-Hodgkin's lymphoma Father   . Hypertension Father   . Diabetes Father   . Stroke Maternal Grandmother   . Kidney disease Paternal Aunt   . Cancer Paternal Aunt   . Kidney disease Paternal Uncle   . Cancer Paternal Uncle      Social History   Tobacco Use  . Smoking status: Never Smoker  . Smokeless tobacco: Never Used  Substance Use Topics  . Alcohol use: No  . Drug use: No    Medications: I have reviewed the patient's current medications. Allergies as of 03/12/2019   No Known Allergies     Medication List       Accurate as of March 12, 2019 12:06 PM. Always use your most recent med list.        albuterol 108 (90 Base) MCG/ACT inhaler Commonly known as:  VENTOLIN HFA Inhale 2 puffs into the lungs every 4 (four) hours as needed for wheezing or shortness of breath.   ALPRAZolam 0.25 MG tablet Commonly known as:  XANAX Take 1 tablet (0.25 mg total) by mouth 2 (two) times daily as needed for anxiety.   amoxicillin-clavulanate 875-125 MG tablet Commonly known as:  Augmentin Take 1 tablet by mouth 2 (two) times daily for 10 days.   aspirin EC 81 MG tablet Take 81 mg by mouth daily.   DULoxetine 60 MG capsule Commonly known as:  CYMBALTA Take 1 capsule (60 mg total) by mouth 2 (two) times daily.   Fish Oil 1000 MG Caps Take 1,000 mg by mouth daily.   glipiZIDE 5 MG tablet Commonly known as:  GLUCOTROL Take 5 mg by mouth 2 (two) times daily.   levothyroxine 100 MCG tablet Commonly known as:  SYNTHROID Take 100 mcg daily by mouth.   lisinopril-hydrochlorothiazide 20-12.5 MG tablet Commonly known as:  ZESTORETIC Take 1 tablet by mouth daily. Hold until follow up with PCP   meclizine 25 MG tablet Commonly known as:  ANTIVERT Take 1 tablet (25 mg total) by mouth 3 (three) times daily as needed for dizziness.   omeprazole 20 MG tablet Commonly known as:  PRILOSEC OTC Take 1 tablet (20 mg total) by mouth daily.   ondansetron 8 MG disintegrating tablet Commonly known as:  Zofran ODT Take 1 tablet (8 mg total) by mouth every 8 (eight) hours as needed for nausea or vomiting.   Pro-biotic Blend Caps Take by mouth.   promethazine 25 MG tablet Commonly known as:  PHENERGAN Take 1  tablet (25 mg total) by mouth every 6 (six) hours as needed for nausea or vomiting.   psyllium 0.52 g capsule Commonly known as:  REGULOID Take 2 capsules (1.04 g total) by mouth daily as needed (constipation).   traZODone 150 MG tablet Commonly known as:  DESYREL Take 1 tablet (150 mg total) by mouth at bedtime as needed for sleep.   Tylenol 8 Hour Arthritis Pain 650 MG CR tablet Generic drug:  acetaminophen Take 1,300 mg by mouth every morning. *May take additional as needed   vitamin C 1000 MG tablet Take 1,000 mg  by mouth as needed.        ROS:  A comprehensive review of systems was negative except for: Constitutional: positive for fatigue Gastrointestinal: positive for abdominal pain, constipation, diarrhea, nausea, reflux symptoms, vomiting and reports some stool incontinence with diarrhea Genitourinary: positive for urinary incontinence and nighttime incontinence Musculoskeletal: positive for back pain and stiff joints Neurological: positive for dizziness    Blood pressure (!) 142/83, pulse 76, temperature 98.4 F (36.9 C), temperature source Oral, resp. rate 18, weight 255 lb (115.7 kg), last menstrual period 03/27/2014, SpO2 95 %. Physical Exam Vitals signs reviewed.  Constitutional:      Appearance: Normal appearance.  HENT:     Head: Normocephalic.     Nose: Nose normal.     Mouth/Throat:     Mouth: Mucous membranes are moist.  Eyes:     Pupils: Pupils are equal, round, and reactive to light.  Neck:     Musculoskeletal: Normal range of motion.  Cardiovascular:     Rate and Rhythm: Normal rate and regular rhythm.  Pulmonary:     Effort: Pulmonary effort is normal.     Breath sounds: Normal breath sounds.  Abdominal:     General: There is no distension.     Palpations: Abdomen is soft.     Tenderness: There is no abdominal tenderness.  Musculoskeletal:        General: No swelling.  Skin:    General: Skin is warm and dry.  Neurological:     General:  No focal deficit present.     Mental Status: She is alert and oriented to person, place, and time.  Psychiatric:        Mood and Affect: Mood normal.        Behavior: Behavior normal.        Thought Content: Thought content normal.        Judgment: Judgment normal.     Results:  Sigmoidoscopy 09/2017  - Erythematous granular mucosa in the mid sigmoid colon. Biopsied. - Diverticulosis in the sigmoid colon. - Internal hemorrhoids. Comment: Mild segmental colitis at sigmoid colon. This finding would not explain patient's symptom complex. No colonic stricture noted. Barium enema to be performedas outpatient.  Biopsy with mild colitis without any features for IBD   Rectal Contrast Enema 10/2017 FINDINGS: The KUB demonstrates surgical clips from previous cholecystectomy as well several ingested tablets in the right lower quadrant as well as a benign bone island in the right ilium.  The entire colon was filled in the normal retrograde manner with water soluble contrast. There are multiple diverticula in the mid sigmoid portion of the colon with scattered diverticula in the descending colon. There is slight redundancy of the transverse colon.  The cecum appears normal. Appendix fills and appears normal. No reflux into the terminal ileum.  No mass lesions or polyps.  IMPRESSION: Diverticulosis of the left side of the colon. Otherwise normal exam.  Personally reviewed CT- thickened sigmoid colon with no signs of perforation, some ticks noted  CT 03/02/2019 IMPRESSION: 1. Acute colitis involving the left hemicolon, progressed from prior CT. No evidence for perforation or abscess. Inflammation is greatest at the sigmoid segment where there is diverticulosis. 2. Hepatic steatosis. 3. Stable 2 mm stone within the left ureteropelvic junction. No hydronephrosis or ureter stone.  Assessment & Plan:  DEBE ANFINSON is a 58 y.o. female with findings most consistent with  diverticulitis of the sigmoid colon in the setting of a complex multiple symptom abdominal  history with chronic nausea/vomiting, diarrhea/ constipation thought to be secondary to IBS given the lack of findings on workup.  She has the episodes of left sided pain and the last few have had CT scan findings concerning for a thickened sigmoid colon and she has known diverticula in this region.  She says her symptoms have been improving since discharge and she is still on antibiotics for a few more days. She is tolerating her diet but did have some harder stools everyday after using imodium.   -Finish up antibiotics, call if symptoms start to return and will extend the course  -Drink water and take colace for BMs twice daily and miralax as needed and use probiotics/ yogurt for diarrhea associated with the antibiotic, stop imodium  -We discussed the option of sigmoid colectomy and the plan for a laparoscopic, hand-assisted versus open approach. We discussed reasons behind needing a colostomy and plans for an anastomosis as long as everything looks healthy.  We discussed the risk of surgery including but not limited to bleeding, infection, leak, ureter injury, colostomy. We discussed that there are colorectal surgeons that do more colon surgery as patient asked, and have told her about Robotic surgery and the option for Colorectal surgery at [facility]. At this time she wants to continue to see me. We have discussed her pelvic floor issue with the incontinence of urine and history of some incontinence of stool with diarrhea and that this could be worse after surgery.  We discussed that not all of her chronic symptoms will be improved with surgery, and we discussed the possibility of finding something else like a cancer.  She had a sigmoidoscopy in 2018 and this was fine, and her last colonoscopy was in 2015.    -We discussed that this would be a surgery we would want to do 6+ weeks out from now and that this is not  considered urgent at this time as her symptoms are improving. We discussed seeing her back in a few weeks to reevaluated and determine appropriate timing.    All questions were answered to the satisfaction of the patient and family.  I spent over 60 minutes with this patient discussing diverticulitis and her symptoms and the options of surgery.    Virl Cagey 03/12/2019, 12:06 PM

## 2019-03-13 ENCOUNTER — Ambulatory Visit (INDEPENDENT_AMBULATORY_CARE_PROVIDER_SITE_OTHER): Payer: 59 | Admitting: Psychiatry

## 2019-03-13 ENCOUNTER — Encounter (HOSPITAL_COMMUNITY): Payer: Self-pay | Admitting: Psychiatry

## 2019-03-13 DIAGNOSIS — F3341 Major depressive disorder, recurrent, in partial remission: Secondary | ICD-10-CM

## 2019-03-13 MED ORDER — DULOXETINE HCL 60 MG PO CPEP: 60.0000 mg | ORAL_CAPSULE | Freq: Two times a day (BID) | ORAL | 0 refills | Status: AC

## 2019-03-13 NOTE — Patient Instructions (Signed)
1. Continueduloxetine 60 mg twice a day- [facility] 2.Continuetrazodone 150- 225mg  at night as needed for sleep 3. Continue Xanax0.25 mg up to twice a day as needed for anxiety  Next appointment 7/20 at 9 AM

## 2019-04-02 ENCOUNTER — Ambulatory Visit: Payer: 59 | Admitting: General Surgery

## 2019-04-04 ENCOUNTER — Ambulatory Visit (INDEPENDENT_AMBULATORY_CARE_PROVIDER_SITE_OTHER): Payer: 59 | Admitting: General Surgery

## 2019-04-04 ENCOUNTER — Encounter: Payer: Self-pay | Admitting: General Surgery

## 2019-04-04 VITALS — BP 169/81 | HR 92 | Temp 97.8°F | Resp 18 | Ht 66.0 in | Wt 248.0 lb

## 2019-04-04 DIAGNOSIS — K5792 Diverticulitis of intestine, part unspecified, without perforation or abscess without bleeding: Secondary | ICD-10-CM | POA: Diagnosis not present

## 2019-04-04 MED ORDER — NEOMYCIN SULFATE 500 MG PO TABS: 1000.0000 mg | ORAL_TABLET | ORAL | 0 refills | Status: DC

## 2019-04-04 MED ORDER — METRONIDAZOLE 500 MG PO TABS: 1000.0000 mg | ORAL_TABLET | ORAL | 0 refills | Status: DC

## 2019-04-04 NOTE — Progress Notes (Signed)
[facility] History and Physical  Reason for Referral: Diverticulitis  Referring Physician: Dr. Laural Golden GI  Chief Complaint    Diverticulitis      Kristin Bailey is a 58 y.o. female.  HPI:  Kristin Bailey is a 58 yo well known to me who has a history of GERD, HTN, DM, IBS and chronic nausea, intermittent diarrhea/ constipation and attacks of abdominal pain that has been occurring for years.  These attacks were thought to originally be possibly colitis but after a work up with Dr. Laural Golden and recent CT findings we are leaning more towards diverticulitis of the sigmoid colon.  She has had 6-7 episodes and has been hospitalized with some of these attacks.  She said that this most recent episode was the most painful and severe.  She has completed her antibiotics and last time we discussed surgical options and discussed her history. She returns today to finalize her decision about surgery.     Her testing with Dr. Laural Golden has included gastric emptying in 2017 that was normal, barium enema in 2018 that showed diverticula, colonoscopy in 2015 that showed diverticula and sigmoidoscopy in 2018 that showed colitis and diverticula.    Past Medical History:  Diagnosis Date  . Anxiety   . Arthritis   . Coronary artery disease   . Depression   . Diabetes mellitus without complication ([place])   . Diverticulitis   . GERD (gastroesophageal reflux disease)   . Hypertension   . Hypothyroidism   . IBS (irritable bowel syndrome)   . Pneumonia   . Thyroid disease     Past Surgical History:  Procedure Laterality Date  . AGILE CAPSULE N/A 11/23/2016   Procedure: AGILE CAPSULE;  Surgeon: Rogene Houston, MD;  Location: [facility];  Service: Endoscopy;  Laterality: N/A;  8:30  . BREAST EXCISIONAL BIOPSY Right   . CARPAL TUNNEL RELEASE    . CESAREAN SECTION    . CHOLECYSTECTOMY    . COLONOSCOPY N/A 08/28/2014   Procedure: COLONOSCOPY;  Surgeon: Rogene Houston, MD;  Location: [facility];   Service: Endoscopy;  Laterality: N/A;  1030  . CYST REMOVAL HAND    . ESOPHAGOGASTRODUODENOSCOPY N/A 03/19/2016   Procedure: ESOPHAGOGASTRODUODENOSCOPY (EGD);  Surgeon: Rogene Houston, MD;  Location: [facility];  Service: Endoscopy;  Laterality: N/A;  . FLEXIBLE SIGMOIDOSCOPY N/A 09/28/2017   Procedure: FLEXIBLE SIGMOIDOSCOPY;  Surgeon: Rogene Houston, MD;  Location: [facility];  Service: Endoscopy;  Laterality: N/A;  . FRACTURE SURGERY     rt ankle   . GIVENS CAPSULE STUDY N/A 12/15/2016   Procedure: GIVENS CAPSULE STUDY;  Surgeon: Rogene Houston, MD;  Location: [facility];  Service: Endoscopy;  Laterality: N/A;  . KNEE SURGERY     meniscus repair  . TUBAL LIGATION      Family History  Problem Relation Age of Onset  . Cancer Mother        colon cancer age74  . Hypertension Mother   . Stroke Mother   . Diabetes Mother   . Non-Hodgkin's lymphoma Father   . Hypertension Father   . Diabetes Father   . Stroke Maternal Grandmother   . Kidney disease Paternal Aunt   . Cancer Paternal Aunt   . Kidney disease Paternal Uncle   . Cancer Paternal Uncle     Social History   Tobacco Use  . Smoking status: Never Smoker  . Smokeless tobacco: Never Used  Substance Use Topics  .  Alcohol use: No  . Drug use: No    Medications: I have reviewed the patient's current medications. Allergies as of 04/04/2019   No Known Allergies     Medication List       Accurate as of Apr 04, 2019  4:21 PM. If you have any questions, ask your nurse or doctor.        albuterol 108 (90 Base) MCG/ACT inhaler Commonly known as:  VENTOLIN HFA Inhale 2 puffs into the lungs every 4 (four) hours as needed for wheezing or shortness of breath.   ALPRAZolam 0.25 MG tablet Commonly known as:  XANAX Take 1 tablet (0.25 mg total) by mouth 2 (two) times daily as needed for anxiety.   aspirin EC 81 MG tablet Take 81 mg by mouth daily.   DULoxetine 60 MG capsule Commonly known as:  CYMBALTA Take  1 capsule (60 mg total) by mouth 2 (two) times daily.   Fish Oil 1000 MG Caps Take 1,000 mg by mouth daily.   glipiZIDE 5 MG tablet Commonly known as:  GLUCOTROL Take 5 mg by mouth 2 (two) times daily.   levothyroxine 100 MCG tablet Commonly known as:  SYNTHROID Take 100 mcg daily by mouth.   lisinopril-hydrochlorothiazide 20-12.5 MG tablet Commonly known as:  ZESTORETIC Take 1 tablet by mouth daily. Hold until follow up with PCP   meclizine 25 MG tablet Commonly known as:  ANTIVERT Take 1 tablet (25 mg total) by mouth 3 (three) times daily as needed for dizziness.   omeprazole 20 MG tablet Commonly known as:  PRILOSEC OTC Take 1 tablet (20 mg total) by mouth daily. What changed:  when to take this   ondansetron 8 MG disintegrating tablet Commonly known as:  Zofran ODT Take 1 tablet (8 mg total) by mouth every 8 (eight) hours as needed for nausea or vomiting.   Pro-biotic Blend Caps Take by mouth.   promethazine 25 MG tablet Commonly known as:  PHENERGAN Take 1 tablet (25 mg total) by mouth every 6 (six) hours as needed for nausea or vomiting.   psyllium 0.52 g capsule Commonly known as:  REGULOID Take 2 capsules (1.04 g total) by mouth daily as needed (constipation).   traZODone 150 MG tablet Commonly known as:  DESYREL Take 1 tablet (150 mg total) by mouth at bedtime as needed for sleep.   Tylenol 8 Hour Arthritis Pain 650 MG CR tablet Generic drug:  acetaminophen Take 1,300 mg by mouth every morning. *May take additional as needed   vitamin C 1000 MG tablet Take 1,000 mg by mouth as needed.        ROS:  A comprehensive review of systems was negative except for: Gastrointestinal: positive for abdominal pain and constipation  Blood pressure (!) 169/81, pulse 92, temperature 97.8 F (36.6 C), resp. rate 18, height 5\' 6"  (1.676 m), weight 248 lb (112.5 kg), last menstrual period 03/27/2014, SpO2 95 %. Physical Exam Vitals signs reviewed.  Constitutional:       Appearance: Normal appearance.  HENT:     Head: Normocephalic.     Nose: Nose normal.     Mouth/Throat:     Mouth: Mucous membranes are moist.  Eyes:     Pupils: Pupils are equal, round, and reactive to light.  Neck:     Musculoskeletal: Normal range of motion.  Cardiovascular:     Rate and Rhythm: Normal rate and regular rhythm.  Pulmonary:     Effort: Pulmonary effort is normal.  Breath sounds: Normal breath sounds.  Abdominal:     General: There is no distension.     Palpations: Abdomen is soft.     Tenderness: There is no abdominal tenderness.     Hernia: No hernia is present.  Musculoskeletal: Normal range of motion.        General: No swelling.  Skin:    General: Skin is warm and dry.  Neurological:     General: No focal deficit present.     Mental Status: She is alert and oriented to person, place, and time.  Psychiatric:        Mood and Affect: Mood normal.        Behavior: Behavior normal.        Thought Content: Thought content normal.        Judgment: Judgment normal.     Results: Sigmoidoscopy 09/2017  - Erythematous granular mucosa in the mid sigmoid colon. Biopsied. - Diverticulosis in the sigmoid colon. - Internal hemorrhoids. Comment: Mild segmental colitis at sigmoid colon. This finding would not explain patient's symptom complex. No colonic stricture noted. Barium enema to be performedas outpatient.  Biopsy with mild colitis without any features for IBD   Rectal Contrast Enema 10/2017 FINDINGS: The KUB demonstrates surgical clips from previous cholecystectomy as well several ingested tablets in the right lower quadrant as well as a benign bone island in the right ilium.  The entire colon was filled in the normal retrograde manner with water soluble contrast. There are multiple diverticula in the mid sigmoid portion of the colon with scattered diverticula in the descending colon. There is slight redundancy of the transverse colon.  The  cecum appears normal. Appendix fills and appears normal. No reflux into the terminal ileum.  No mass lesions or polyps.  IMPRESSION: Diverticulosis of the left side of the colon. Otherwise normal exam.  Personally reviewed CT- thickened sigmoid colon with no signs of perforation, some ticks noted  CT 03/02/2019 IMPRESSION: 1. Acute colitis involving the left hemicolon, progressed from prior CT. No evidence for perforation or abscess. Inflammation is greatest at the sigmoid segment where there is diverticulosis. 2. Hepatic steatosis. 3. Stable 2 mm stone within the left ureteropelvic junction. No hydronephrosis or ureter stone.  Assessment & Plan:  DESARIE FEILD is a 58 y.o. female with findings most consistent with diverticulitis of the sigmoid colon.  She has had multiple similar episodes. She has complex set of symptoms and we have discussed that not all symptoms are secondary to the diverticulitis and that her chronic nausea vomiting and intermittent diarrhea/ constipation may continue.  We have discussed that she has chronic pelvic floor issues with some urinary incontinence but no real issue with fecal incontinence but that these things could get worse with surgery. We discussed that she could need Physical therapy versus UroGyn in the future. We discussed surgery and the plan to remove the diseased colon. We discussed the possibility of cancer. We discussed that the risk of surgery include but are not limited to bleeding, infection, risk of colostomy, anastomotic leak, risk of injury to a ureter or other organ, and the post operative course. We discussed the prep prior to surgery.   We have discussed the COVID 19 pandemic. We have discussed [facility]'s limited elective surgery schedule and COVID 19 testing to keep all patient's safe during this pandemic. We have discussed the need for isolation after their testing and requirements of staying out of work, not going to stores or visiting with  people.   Prep:  Buy from the Store: Miralax bottle (288g).  Gatorade 64 oz (not red). Dulcolax tablets.   The Day Prior to Surgery: Take 4 ducolax tablets at 7am with water. Drink plenty of clear liquids all day to avoid dehydration, no solid food.    Mix the bottle of Miralax and 64 oz of Gatorade and drink this mixture starting at 10am. Drink it gradually over the next few hours, 8 ounces every 15-30 minutes until it is gone. Finish this by 2pm.  Take 2 neomycin 500mg  tablets and 2 metronidazole 500mg  tablets at 2 pm. Take 2 neomycin 500mg  tablets and 2 metronidazole 500mg  tablets at 3pm. Take 2 neomycin 500mg  tablets and 2 metronidazole 500mg  tablets at 10pm.    Do not eat or drink anything after midnight the night before your surgery.  Do not eat or drink anything that morning, and take medications as instructed by the hospital staff on your preoperative visit.    Due to our Internet being down. The patient is going to come back to clinic to get her instructions for her colon preparation.  I spent over 25 minutes with the patient explaining the surgery and discussing the risk and options, etc.   Virl Cagey 04/04/2019, 4:21 PM

## 2019-04-04 NOTE — Patient Instructions (Addendum)
Buy from the Store: Miralax bottle [phone]).  Gatorade 64 oz (not red). Dulcolax tablets.   The Day Prior to Surgery: Take 4 ducolax tablets at 7am with water. Drink plenty of clear liquids all day to avoid dehydration, no solid food.    Mix the bottle of Miralax and 64 oz of Gatorade and drink this mixture starting at 10am. Drink it gradually over the next few hours, 8 ounces every 15-30 minutes until it is gone. Finish this by 2pm.  Take 2 neomycin 500mg  tablets and 2 metronidazole 500mg  tablets at 2 pm. Take 2 neomycin 500mg  tablets and 2 metronidazole 500mg  tablets at 3pm. Take 2 neomycin 500mg  tablets and 2 metronidazole 500mg  tablets at 10pm.    Do not eat or drink anything after midnight the night before your surgery.  Do not eat or drink anything that morning, and take medications as instructed by the hospital staff on your preoperative visit.    Laparoscopic Colectomy Laparoscopic colectomy is surgery to remove part or all of the large intestine (colon). This procedure may be used to treat several conditions, including:  Inflammation and infection of the colon (diverticulitis).  Tumors or masses in the colon.  Inflammatory bowel disease, such as Crohn disease or ulcerative colitis. Colectomy is an option when symptoms cannot be controlled with medicines.  Bleeding from the colon that cannot be controlled by another method.  Blockage or obstruction of the colon. Tell a health care provider about:  Any allergies you have.  All medicines you are taking, including vitamins, herbs, eye drops, creams, and over-the-counter medicines.  Any problems you or family members have had with anesthetic medicines.  Any blood disorders you have.  Any surgeries you have had.  Any medical conditions you have. What are the risks? Generally, this is a safe procedure. However, problems may occur, including:  Infection.  Bleeding.  Allergic reactions to medicines or dyes.  Damage  to other structures or organs.  Leaking from where the colon was sewn together.  Future blockage of the small intestines from scar tissue. Another surgery may be needed to repair this.  Needing to convert to an open procedure. Complications such as damage to other organs or excessive bleeding may require the surgeon to convert from a laparoscopic procedure to an open procedure. This involves making a larger incision in the abdomen. Medicines  Ask your health care provider about: ? Changing or stopping your regular medicines. This is especially important if you are taking diabetes medicines or blood thinners. ? Taking medicines such as aspirin and ibuprofen. These medicines can thin your blood. Do not take these medicines before your procedure if your health care provider instructs you not to.  You may be given antibiotic medicine to clean out bacteria from your colon. Follow the directions carefully and take the medicine at the correct time. General instructions  You may be prescribed an oral bowel prep to clean out your colon in preparation for the surgery: ? Follow instructions from your health care provider about how to do this. ? Do not eat or drink anything else after you have started the bowel prep, unless your health care provider tells you it is safe to do so.  Do not use any products that contain nicotine or tobacco, such as cigarettes and e-cigarettes. If you need help quitting, ask your health care provider. What happens during the procedure?  To reduce your risk of infection: ? Your health care team will wash or sanitize their hands. ?  Your skin will be washed with soap.  An IV tube will be inserted into one of your veins to deliver fluid and medication.  You will be given one of the following: ? A medicine to help you relax (sedative). ? A medicine to make you fall asleep (general anesthetic).  Small monitors will be connected to your body. They will be used to check  your heart, blood pressure, and oxygen level.  A breathing tube may be placed into your lungs during the procedure.  A thin, flexible tube (catheter) will be placed into your bladder to drain urine.  A tube may be placed through your nose and into your stomach to drain stomach fluids (nasogastric tube, or NG tube).  Your abdomen will be filled with air so it expands. This gives the surgeon more room to operate and makes your organs easier to see.  Several small cuts (incisions) will be made in your abdomen.  A thin, lighted tube with a tiny camera on the end (laparoscope) will be put through one of the small incisions. The camera on the laparoscope will send a picture to a computer screen in the operating room. This will give the surgeon a good view inside your abdomen.  Hollow tubes will be put through the other small incisions in your abdomen. The tools that are needed for the procedure will be put through these tubes.  Clamps or staples will be put on both ends of the diseased part of the colon.  The part of the intestine between the clamps or staples will be removed.  If possible, the ends of the healthy colon that remain will be stitched (sutured) or stapled together to allow your body to pass waste (stool).  Sometimes, the remaining colon cannot be stitched back together. If this is the case, a colostomy will be needed. If you need a colostomy: ? An opening to the outside of your body (stoma) will be made through your abdomen. ? The end of your colon will be brought to the opening. It will be stitched to the skin. ? A bag will be attached to the opening. Stool will drain into this removable bag. ? The colostomy may be temporary or permanent.  The incisions from the colectomy will be closed with sutures or staples. The procedure may vary among health care providers and hospitals. What happens after the procedure?  Your blood pressure, heart rate, breathing rate, and blood oxygen  level will be monitored until the medicines you were given have worn off.  You will receive fluids through an IV tube until your bowels start to work properly.  Once your bowels are working again, you will be given clear liquids first and then solid food as tolerated.  You will be given medicines to control your pain and nausea, if needed.  Do not drive for 24 hours if you were given a sedative. This information is not intended to replace advice given to you by your health care provider. Make sure you discuss any questions you have with your health care provider. Document Released: 01/21/2003 Document Revised: 08/01/2016 Document Reviewed: 08/01/2016 Elsevier Interactive Patient Education  2019 [employer].

## 2019-04-04 NOTE — H&P (Addendum)
[facility] History and Physical  Reason for Referral: Diverticulitis  Referring Physician: Dr. Laural Golden GI     Chief Complaint    Diverticulitis      Kristin Bailey is a 58 y.o. female.  HPI:  Kristin Bailey is a 58 yo well known to me who has a history of GERD, HTN, DM, IBS and chronic nausea, intermittent diarrhea/ constipation and attacks of abdominal pain that has been occurring for years.  These attacks were thought to originally be possibly colitis but after a work up with Dr. Laural Golden and recent CT findings we are leaning more towards diverticulitis of the sigmoid colon.  She has had 6-7 episodes and has been hospitalized with some of these attacks.  She said that this most recent episode was the most painful and severe.  She has completed her antibiotics and last time we discussed surgical options and discussed her history. She returns today to finalize her decision about surgery.     Her testing with Dr. Laural Golden has included gastric emptying in 2017 that was normal, barium enema in 2018 that showed diverticula, colonoscopy in 2015 that showed diverticula and sigmoidoscopy in 2018 that showed colitis and diverticula.        Past Medical History:  Diagnosis Date  . Anxiety   . Arthritis   . Coronary artery disease   . Depression   . Diabetes mellitus without complication ([place])   . Diverticulitis   . GERD (gastroesophageal reflux disease)   . Hypertension   . Hypothyroidism   . IBS (irritable bowel syndrome)   . Pneumonia   . Thyroid disease          Past Surgical History:  Procedure Laterality Date  . AGILE CAPSULE N/A 11/23/2016   Procedure: AGILE CAPSULE;  Surgeon: Rogene Houston, MD;  Location: [facility];  Service: Endoscopy;  Laterality: N/A;  8:30  . BREAST EXCISIONAL BIOPSY Right   . CARPAL TUNNEL RELEASE    . CESAREAN SECTION    . CHOLECYSTECTOMY    . COLONOSCOPY N/A 08/28/2014   Procedure: COLONOSCOPY;  Surgeon:  Rogene Houston, MD;  Location: [facility];  Service: Endoscopy;  Laterality: N/A;  1030  . CYST REMOVAL HAND    . ESOPHAGOGASTRODUODENOSCOPY N/A 03/19/2016   Procedure: ESOPHAGOGASTRODUODENOSCOPY (EGD);  Surgeon: Rogene Houston, MD;  Location: [facility];  Service: Endoscopy;  Laterality: N/A;  . FLEXIBLE SIGMOIDOSCOPY N/A 09/28/2017   Procedure: FLEXIBLE SIGMOIDOSCOPY;  Surgeon: Rogene Houston, MD;  Location: [facility];  Service: Endoscopy;  Laterality: N/A;  . FRACTURE SURGERY     rt ankle   . GIVENS CAPSULE STUDY N/A 12/15/2016   Procedure: GIVENS CAPSULE STUDY;  Surgeon: Rogene Houston, MD;  Location: [facility];  Service: Endoscopy;  Laterality: N/A;  . KNEE SURGERY     meniscus repair  . TUBAL LIGATION           Family History  Problem Relation Age of Onset  . Cancer Mother        colon cancer age74  . Hypertension Mother   . Stroke Mother   . Diabetes Mother   . Non-Hodgkin's lymphoma Father   . Hypertension Father   . Diabetes Father   . Stroke Maternal Grandmother   . Kidney disease Paternal Aunt   . Cancer Paternal Aunt   . Kidney disease Paternal Uncle   . Cancer Paternal Uncle     Social History  Tobacco Use  . Smoking status: Never Smoker  . Smokeless tobacco: Never Used  Substance Use Topics  . Alcohol use: No  . Drug use: No    Medications: I have reviewed the patient's current medications. Allergies as of 04/04/2019   No Known Allergies        Medication List       Accurate as of Apr 04, 2019  4:21 PM. If you have any questions, ask your nurse or doctor.        albuterol 108 (90 Base) MCG/ACT inhaler Commonly known as:  VENTOLIN HFA Inhale 2 puffs into the lungs every 4 (four) hours as needed for wheezing or shortness of breath.   ALPRAZolam 0.25 MG tablet Commonly known as:  XANAX Take 1 tablet (0.25 mg total) by mouth 2 (two) times daily as needed for anxiety.   aspirin EC 81  MG tablet Take 81 mg by mouth daily.   DULoxetine 60 MG capsule Commonly known as:  CYMBALTA Take 1 capsule (60 mg total) by mouth 2 (two) times daily.   Fish Oil 1000 MG Caps Take 1,000 mg by mouth daily.   glipiZIDE 5 MG tablet Commonly known as:  GLUCOTROL Take 5 mg by mouth 2 (two) times daily.   levothyroxine 100 MCG tablet Commonly known as:  SYNTHROID Take 100 mcg daily by mouth.   lisinopril-hydrochlorothiazide 20-12.5 MG tablet Commonly known as:  ZESTORETIC Take 1 tablet by mouth daily. Hold until follow up with PCP   meclizine 25 MG tablet Commonly known as:  ANTIVERT Take 1 tablet (25 mg total) by mouth 3 (three) times daily as needed for dizziness.   omeprazole 20 MG tablet Commonly known as:  PRILOSEC OTC Take 1 tablet (20 mg total) by mouth daily. What changed:  when to take this   ondansetron 8 MG disintegrating tablet Commonly known as:  Zofran ODT Take 1 tablet (8 mg total) by mouth every 8 (eight) hours as needed for nausea or vomiting.   Pro-biotic Blend Caps Take by mouth.   promethazine 25 MG tablet Commonly known as:  PHENERGAN Take 1 tablet (25 mg total) by mouth every 6 (six) hours as needed for nausea or vomiting.   psyllium 0.52 g capsule Commonly known as:  REGULOID Take 2 capsules (1.04 g total) by mouth daily as needed (constipation).   traZODone 150 MG tablet Commonly known as:  DESYREL Take 1 tablet (150 mg total) by mouth at bedtime as needed for sleep.   Tylenol 8 Hour Arthritis Pain 650 MG CR tablet Generic drug:  acetaminophen Take 1,300 mg by mouth every morning. *May take additional as needed   vitamin C 1000 MG tablet Take 1,000 mg by mouth as needed.        ROS:  A comprehensive review of systems was negative except for: Gastrointestinal: positive for abdominal pain and constipation  Blood pressure (!) 169/81, pulse 92, temperature 97.8 F (36.6 C), resp. rate 18, height 5\' 6"  (1.676 m),  weight 248 lb (112.5 kg), last menstrual period 03/27/2014, SpO2 95 %. Physical Exam Vitals signs reviewed.  Constitutional:      Appearance: Normal appearance.  HENT:     Head: Normocephalic.     Nose: Nose normal.     Mouth/Throat:     Mouth: Mucous membranes are moist.  Eyes:     Pupils: Pupils are equal, round, and reactive to light.  Neck:     Musculoskeletal: Normal range of motion.  Cardiovascular:  Rate and Rhythm: Normal rate and regular rhythm.  Pulmonary:     Effort: Pulmonary effort is normal.     Breath sounds: Normal breath sounds.  Abdominal:     General: There is no distension.     Palpations: Abdomen is soft.     Tenderness: There is no abdominal tenderness.     Hernia: No hernia is present.  Musculoskeletal: Normal range of motion.        General: No swelling.  Skin:    General: Skin is warm and dry.  Neurological:     General: No focal deficit present.     Mental Status: She is alert and oriented to person, place, and time.  Psychiatric:        Mood and Affect: Mood normal.        Behavior: Behavior normal.        Thought Content: Thought content normal.        Judgment: Judgment normal.     Results: Sigmoidoscopy 09/2017 -Erythematous granular mucosa in the mid sigmoid colon. Biopsied. - Diverticulosis in the sigmoid colon. - Internal hemorrhoids. Comment: Mild segmental colitis at sigmoid colon. This finding would not explain patient'ssymptom complex. No colonic stricture noted. Barium enema to be performedas outpatient.  Biopsy with mild colitis without any features for IBD   Rectal Contrast Enema 10/2017 FINDINGS: The KUB demonstrates surgical clips from previous cholecystectomy as well several ingested tablets in the right lower quadrant as well as a benign bone island in the right ilium.  The entire colon was filled in the normal retrograde manner with water soluble contrast. There are multiple diverticula in the mid sigmoid  portion of the colon with scattered diverticula in the descending colon. There is slight redundancy of the transverse colon.  The cecum appears normal. Appendix fills and appears normal. No reflux into the terminal ileum.  No mass lesions or polyps.  IMPRESSION: Diverticulosis of the left side of the colon. Otherwise normal exam.  Personally reviewed CT- thickened sigmoid colon with no signs of perforation, some ticks noted CT 03/02/2019 IMPRESSION: 1. Acute colitis involving the left hemicolon, progressed from prior CT. No evidence for perforation or abscess. Inflammation is greatest at the sigmoid segment where there is diverticulosis. 2. Hepatic steatosis. 3. Stable 2 mm stone within the left ureteropelvic junction. No hydronephrosis or ureter stone.  Assessment & Plan:  SHAJUAN MUSSO is a 58 y.o. female with findings most consistent with diverticulitis of the sigmoid colon.  She has had multiple similar episodes. She has complex set of symptoms and we have discussed that not all symptoms are secondary to the diverticulitis and that her chronic nausea vomiting and intermittent diarrhea/ constipation may continue.  We have discussed that she has chronic pelvic floor issues with some urinary incontinence but no real issue with fecal incontinence but that these things could get worse with surgery. We discussed that she could need Physical therapy versus UroGyn in the future. We discussed surgery and the plan to remove the diseased colon. We discussed the possibility of cancer. We discussed that the risk of surgery include but are not limited to bleeding, infection, risk of colostomy, anastomotic leak, risk of injury to a ureter or other organ, and the post operative course. We discussed the prep prior to surgery.   We have discussed the COVID 19 pandemic. We have discussed [facility]'s limited elective surgery schedule and COVID 19 testing to keep all patient's safe during this pandemic. We  have discussed the  need for isolation after their testing and requirements of staying out of work, not going to stores or visiting with people.   Prep:  Buy from the Store: Miralax bottle (288g).  Gatorade 64 oz (not red). Dulcolax tablets.   The Day Prior to Surgery: Take 4 ducolax tablets at 7am with water. Drink plenty of clear liquids all day to avoid dehydration, no solid food.   Mix thebottle of Miralax and 64 oz of Gatorade and drink this mixture starting at 10am. Drinkit gradually over the next few hours,8 ounces every 15-30 minutes until it is gone. Finish this by 2pm.  Take 2 neomycin 500mg  tablets and 2 metronidazole 500mg  tablets at 2 pm. Take 2 neomycin 500mg  tablets and 2 metronidazole 500mg  tablets at 3pm. Take 2 neomycin 500mg  tablets and 2 metronidazole 500mg  tablets at 10pm.   Do not eat or drink anything after midnight the night before your surgery.  Do not eat or drink anything that morning, and take medications as instructed by the hospital staff on your preoperative visit.   Due to our Internet being down. The patient is going to come back to clinic to get her instructions for her colon preparation.  I spent over 25 minutes with the patient explaining the surgery and discussing the risk and options, etc.   We have discussed the COVID 19 pandemic. We have discussed [facility] limited elective surgery schedule and COVID 19 testing to keep all patient's safe during this pandemic. We have discussed the need for isolation after their testing and requirements of staying out of work, not going to stores or visiting with people.    Virl Cagey 04/04/2019, 4:21 PM

## 2019-04-09 ENCOUNTER — Other Ambulatory Visit: Payer: Self-pay

## 2019-04-15 NOTE — Patient Instructions (Signed)
Kristin Bailey  04/15/2019     @PREFPERIOPPHARMACY @   Your procedure is scheduled on  04/22/2019 .  Report to [facility] at  615   A.M.  Call this number if you have problems the morning of surgery:  [phone]   Remember:  Follow any diet and prep instructions given to yoou by Dr Constance Haw.                    Take these medicines the morning of surgery with A SIP OF WATER  Xanax(if needed), cymbalta, levothyroxine, zofran or phenergan(if needed). Use your inhaler before you come.    Do not wear jewelry, make-up or nail polish.  Do not wear lotions, powders, or perfumes, or deodorant.  Do not shave 48 hours prior to surgery.  Men may shave face and neck.  Do not bring valuables to the hospital.  [facility] is not responsible for any belongings or valuables.  Contacts, dentures or bridgework may not be worn into surgery.  Leave your suitcase in the car.  After surgery it may be brought to your room.  For patients admitted to the hospital, discharge time will be determined by your treatment team.  Patients discharged the day of surgery will not be allowed to drive home.   Name and phone number of your driver:   family Special instructions:  None  Please read over the following fact sheets that you were given. Pain Booklet, Coughing and Deep Breathing, Surgical Site Infection Prevention, Anesthesia Post-op Instructions and Care and Recovery After Surgery       Laparoscopic Colectomy, Care After This sheet gives you information about how to care for yourself after your procedure. Your health care provider may also give you more specific instructions. If you have problems or questions, contact your health care provider. What can I expect after the procedure? After your procedure, it is common to have the following:  Pain in your abdomen, especially in the incision areas. You will be given medicine to control the pain.  Tiredness. This is a normal part  of the recovery process. Your energy level will return to normal over the next several weeks.  Changes in your bowel movements, such as constipation or needing to go more often. Talk with your health care provider about how to manage this. Follow these instructions at home: Medicines  Take over-the-counter and prescription medicines only as told by your health care provider.  Do not drive or use heavy machinery while taking prescription pain medicine.  Do not drink alcohol while taking prescription pain medicine.  If you were prescribed an antibiotic medicine, use it as told by your health care provider. Do not stop using the antibiotic even if you start to feel better. Incision care   Follow instructions from your health care provider about how to take care of your incision areas. Make sure you: ? Keep your incisions clean and dry. ? Wash your hands with soap and water before and after applying medicine to the areas, and before and after changing your bandage (dressing). If soap and water are not available, use hand sanitizer. ? Change your dressing as told by your health care provider. ? Leave stitches (sutures), skin glue, or adhesive strips in place. These skin closures may need to stay in place for 2 weeks or longer. If adhesive strip edges start to loosen and curl up, you may trim  the loose edges. Do not remove adhesive strips completely unless your health care provider tells you to do that.  Do not wear tight clothing over the incisions. Tight clothing may rub and irritate the incision areas, which may cause the incisions to open.  Do not take baths, swim, or use a hot tub until your health care provider approves. Ask your health care provider if you can take showers. You may only be allowed to take sponge baths for bathing.  Check your incision area every day for signs of infection. Check for: ? More redness, swelling, or pain. ? More fluid or blood. ? Warmth. ? Pus or a bad  smell. Activity  Avoid lifting anything that is heavier than 10 lb (4.5 kg) for 2 weeks or until your health care provider says it is okay.  You may resume normal activities as told by your health care provider. Ask your health care provider what activities are safe for you.  Take rest breaks during the day as needed. Eating and drinking  Follow instructions from your health care provider about what you can eat after surgery.  To prevent or treat constipation while you are taking prescription pain medicine, your health care provider may recommend that you: ? Drink enough fluid to keep your urine clear or pale yellow. ? Take over-the-counter or prescription medicines. ? Eat foods that are high in fiber, such as fresh fruits and vegetables, whole grains, and beans. ? Limit foods that are high in fat and processed sugars, such as fried and sweet foods. General instructions  Ask your health care provider when you will need an appointment to get your sutures or staples removed.  Keep all follow-up visits as told by your health care provider. This is important. Contact a health care provider if:  You have more redness, swelling, or pain around your incisions.  You have more fluid or blood coming from the incisions.  Your incisions feel warm to the touch.  You have pus or a bad smell coming from your incisions or your dressing.  You have a fever.  You have an incision that breaks open (edges not staying together) after sutures or staples have been removed. Get help right away if:  You develop a rash.  You have chest pain or difficulty breathing.  You have pain or swelling in your legs.  You feel light-headed or you faint.  Your abdomen swells (becomes distended).  You have nausea or vomiting.  You have blood in your stool (feces). This information is not intended to replace advice given to you by your health care provider. Make sure you discuss any questions you have with  your health care provider. Document Released: 05/20/2005 Document Revised: 07/20/2018 Document Reviewed: 08/01/2016 Elsevier Interactive Patient Education  2019 [place].  Open Colectomy, Care After This sheet gives you information about how to care for yourself after your procedure. Your health care provider may also give you more specific instructions. If you have problems or questions, contact your health care provider. What can I expect after the procedure? After the procedure, it is common to have:  Pain in your abdomen, especially along your incision.  Tiredness. Your energy level will return to normal over the next several weeks.  Constipation.  Nausea.  Difficulty urinating. Follow these instructions at home: Activity  You may be able to return to most of your normal activities within 1-2 weeks, such as working, walking up stairs, and sexual activity.  Avoid activities that require  a lot of energy for 4-6 weeks after surgery, such as running, climbing, and lifting heavy objects. Ask your health care provider what activities are safe for you.  Take rest breaks during the day as needed.  Do not drive for 1-2 weeks or until your health care provider says that it is safe.  Do not drive or use heavy machinery while taking prescription pain medicines.  Do not lift anything that is heavier than 10 lb (4.3 kg) until your health care provider says that it is safe. Incision care   Follow instructions from your health care provider about how to take care of your incision. Make sure you: ? Wash your hands with soap and water before you change your bandage (dressing). If soap and water are not available, use hand sanitizer. ? Change your dressing as told by your health care provider. ? Leave stitches (sutures) or staples in place. These skin closures may need to stay in place for 2 weeks or longer.  Avoid wearing tight clothing around your incision.  Protect your incision  area from the sun.  Check your incision area every day for signs of infection. Check for: ? More redness, swelling, or pain. ? More fluid or blood. ? Warmth. ? Pus or a bad smell. General instructions  Do not take baths, swim, or use a hot tub until your health care provider approves. Ask your health care provider when you may shower.  Take over-the-counter and prescription medicines, including stool softeners, only as told by your health care provider.  Eat a low-fat and low-fiber diet for the first 4 weeks after surgery.  Keep all follow-up visits as told by your health care provider. This is important. Contact a health care provider if:  You have more redness, swelling, or pain around your incision.  You have more fluid or blood coming from your incision.  Your incision feels warm to the touch.  You have pus or a bad smell coming from your incision.  You have a fever or chills.  You do not have a bowel movement 2-3 days after surgery.  You cannot eat or drink for 24 hours or more.  You have persistent nausea and vomiting.  You have abdominal pain that gets worse and does not get better with medicine. Get help right away if:  You have chest pain.  You have shortness of breath.  You have pain or swelling in your legs.  Your incision breaks open after your sutures or staples have been removed.  You have bleeding from the rectum. This information is not intended to replace advice given to you by your health care provider. Make sure you discuss any questions you have with your health care provider. Document Released: 05/24/2011 Document Revised: 08/01/2016 Document Reviewed: 08/01/2016 Elsevier Interactive Patient Education  2019 [place] Anesthesia, Adult, Care After This sheet gives you information about how to care for yourself after your procedure. Your health care provider may also give you more specific instructions. If you have problems or  questions, contact your health care provider. What can I expect after the procedure? After the procedure, the following side effects are common:  Pain or discomfort at the IV site.  Nausea.  Vomiting.  Sore throat.  Trouble concentrating.  Feeling cold or chills.  Weak or tired.  Sleepiness and fatigue.  Soreness and body aches. These side effects can affect parts of the body that were not involved in surgery. Follow these instructions at home:  For at least 24 hours after the procedure:  Have a responsible adult stay with you. It is important to have someone help care for you until you are awake and alert.  Rest as needed.  Do not: ? Participate in activities in which you could fall or become injured. ? Drive. ? Use heavy machinery. ? Drink alcohol. ? Take sleeping pills or medicines that cause drowsiness. ? Make important decisions or sign legal documents. ? Take care of children on your own. Eating and drinking  Follow any instructions from your health care provider about eating or drinking restrictions.  When you feel hungry, start by eating small amounts of foods that are soft and easy to digest (bland), such as toast. Gradually return to your regular diet.  Drink enough fluid to keep your urine pale yellow.  If you vomit, rehydrate by drinking water, juice, or clear broth. General instructions  If you have sleep apnea, surgery and certain medicines can increase your risk for breathing problems. Follow instructions from your health care provider about wearing your sleep device: ? Anytime you are sleeping, including during daytime naps. ? While taking prescription pain medicines, sleeping medicines, or medicines that make you drowsy.  Return to your normal activities as told by your health care provider. Ask your health care provider what activities are safe for you.  Take over-the-counter and prescription medicines only as told by your health care provider.   If you smoke, do not smoke without supervision.  Keep all follow-up visits as told by your health care provider. This is important. Contact a health care provider if:  You have nausea or vomiting that does not get better with medicine.  You cannot eat or drink without vomiting.  You have pain that does not get better with medicine.  You are unable to pass urine.  You develop a skin rash.  You have a fever.  You have redness around your IV site that gets worse. Get help right away if:  You have difficulty breathing.  You have chest pain.  You have blood in your urine or stool, or you vomit blood. Summary  After the procedure, it is common to have a sore throat or nausea. It is also common to feel tired.  Have a responsible adult stay with you for the first 24 hours after general anesthesia. It is important to have someone help care for you until you are awake and alert.  When you feel hungry, start by eating small amounts of foods that are soft and easy to digest (bland), such as toast. Gradually return to your regular diet.  Drink enough fluid to keep your urine pale yellow.  Return to your normal activities as told by your health care provider. Ask your health care provider what activities are safe for you. This information is not intended to replace advice given to you by your health care provider. Make sure you discuss any questions you have with your health care provider. Document Released: 02/06/2001 Document Revised: 06/16/2017 Document Reviewed: 06/16/2017 Elsevier Interactive Patient Education  2019 [place].  How to Use Chlorhexidine Before Surgery Chlorhexidine gluconate (CHG) is a germ-killing (antiseptic) solution that is used to clean the skin. It gets rid of the bacteria that normally live on the skin. Cleaning your skin with CHG before surgery helps lower the risk for infection after surgery. To clean your skin before surgery, you may be given:  A CHG  solution to use in the shower.  A prepackaged  cloth that contains CHG. What are the risks? Risks of using CHG include:  A skin reaction.  Hearing loss, if CHG gets in your ears.  Eye injury, if CHG gets in your eyes and is not rinsed out.  The CHG product catching fire. Make sure that you avoid smoking and flames after applying CHG to your skin. Do not use CHG:  If you have a chlorhexidine allergy or have previously reacted to chlorhexidine.  On babies younger than 90 months of age. How to use CHG solution   Use CHG only as told by your health care provider, and follow the instructions on the label.  Use CHG solution while taking a shower. Follow these steps when using CHG solution (unless your health care provider gives you different instructions): 1. Start the shower. 2. Use your normal soap and shampoo to wash your face and hair. 3. Turn off the shower or move out of the shower stream. 4. Pour the CHG onto a clean washcloth. Do not use any type of brush or rough-edged sponge. 5. Starting at your neck, lather your body down to your toes. Make sure you:  Pay special attention to the part of your body where you will be having surgery. Scrub this area for at least 1 minute.  Use the full amount of CHG as directed. Usually, this is one bottle.  Do not use CHG on your head or face. If the solution gets into your ears or eyes, rinse them well with water.  Avoid your genital area.  Avoid any areas of skin that have broken skin, cuts, or scrapes.  Scrub your back and under your arms. Make sure to wash skin folds. 6. Let the lather sit on your skin for 1-2 minutes or as long as told by your health care provider. 7. Thoroughly rinse your entire body in the shower. Make sure that all body creases and crevices are rinsed well. 8. Dry off with a clean towel. Do not put any substances on your body afterward, such as powder, lotion, or perfume. 9. Put on clean clothes or pajamas. 10.  If it is the night before your surgery, sleep in clean sheets. How to use CHG prepackaged cloths   Only use CHG cloths as told by your health care provider, and follow the instructions on the label.  Use the CHG cloth on clean, dry skin. Follow these steps when using a CHG cloth (unless your health care provider gives you different instructions): 1. Using the CHG cloth, vigorously scrub the part of your body where you will be having surgery. Scrub using a back-and-forth motion for 3 minutes. The area on your body should be completely wet with CHG when you are done scrubbing. 2. Do not rinse. Discard the cloth and let the area air-dry for 1 minute. Do not put any substances on your body afterward, such as powder, lotion, or perfume. 3. Put on clean clothes or pajamas. 4. If it is the night before your surgery, sleep in clean sheets. Contact a health care provider if:  Your skin gets irritated after scrubbing.  You have questions about using your solution or cloth. Get help right away if:  Your eyes become very red or swollen.  Your eyes itch badly.  Your skin itches badly and is red or swollen.  Your hearing changes.  You have trouble seeing.  You have swelling or tingling in your mouth or throat.  You have trouble breathing.  You swallow any chlorhexidine. Summary  Chlorhexidine gluconate (CHG) is a germ-killing (antiseptic) solution that is used to clean the skin. Cleaning your skin with CHG before surgery helps lower the risk for infection after surgery.  You may be given CHG to use at home. It may be in a bottle or in a prepackaged cloth to use on your skin. Carefully follow your health care provider's instructions and the instructions on the product label.  Do not use CHG if you have a chlorhexidine allergy.  Contact your health care provider if your skin gets irritated after scrubbing. This information is not intended to replace advice given to you by your health care  provider. Make sure you discuss any questions you have with your health care provider. Document Released: 07/25/2012 Document Revised: 09/28/2017 Document Reviewed: 09/28/2017 Elsevier Interactive Patient Education  2019 [employer].

## 2019-04-18 ENCOUNTER — Other Ambulatory Visit (HOSPITAL_COMMUNITY)
Admission: RE | Admit: 2019-04-18 | Discharge: 2019-04-18 | Disposition: A | Discharge status: Home / Self Care | Payer: 59 | Source: Ambulatory Visit | Attending: General Surgery | Admitting: General Surgery

## 2019-04-18 ENCOUNTER — Other Ambulatory Visit: Payer: Self-pay

## 2019-04-18 ENCOUNTER — Encounter (HOSPITAL_COMMUNITY)
Admission: RE | Admit: 2019-04-18 | Discharge: 2019-04-18 | Disposition: A | Discharge status: Home / Self Care | Payer: 59 | Source: Ambulatory Visit | Attending: General Surgery | Admitting: General Surgery

## 2019-04-18 ENCOUNTER — Encounter (HOSPITAL_COMMUNITY): Payer: Self-pay

## 2019-04-18 DIAGNOSIS — Z1159 Encounter for screening for other viral diseases: Secondary | ICD-10-CM | POA: Insufficient documentation

## 2019-04-18 DIAGNOSIS — Z01812 Encounter for preprocedural laboratory examination: Secondary | ICD-10-CM | POA: Insufficient documentation

## 2019-04-18 LAB — CBC WITH DIFFERENTIAL/PLATELET
Abs Immature Granulocytes: 0.05 10*3/uL (ref 0.00–0.07)
Basophils Absolute: 0.1 10*3/uL (ref 0.0–0.1)
Basophils Relative: 0 %
Eosinophils Absolute: 0.1 10*3/uL (ref 0.0–0.5)
Eosinophils Relative: 1 %
HCT: 44.9 % (ref 36.0–46.0)
Hemoglobin: 14.3 g/dL (ref 12.0–15.0)
Immature Granulocytes: 0 %
Lymphocytes Relative: 20 %
Lymphs Abs: 2.3 10*3/uL (ref 0.7–4.0)
MCH: 27.3 pg (ref 26.0–34.0)
MCHC: 31.8 g/dL (ref 30.0–36.0)
MCV: 85.7 fL (ref 80.0–100.0)
Monocytes Absolute: 0.8 10*3/uL (ref 0.1–1.0)
Monocytes Relative: 7 %
Neutro Abs: 8.3 10*3/uL — ABNORMAL HIGH (ref 1.7–7.7)
Neutrophils Relative %: 72 %
Platelets: 288 10*3/uL (ref 150–400)
RBC: 5.24 MIL/uL — ABNORMAL HIGH (ref 3.87–5.11)
RDW: 13.9 % (ref 11.5–15.5)
WBC: 11.5 10*3/uL — ABNORMAL HIGH (ref 4.0–10.5)
nRBC: 0 % (ref 0.0–0.2)

## 2019-04-18 LAB — COMPREHENSIVE METABOLIC PANEL
ALT: 22 U/L (ref 0–44)
AST: 21 U/L (ref 15–41)
Albumin: 4.3 g/dL (ref 3.5–5.0)
Alkaline Phosphatase: 75 U/L (ref 38–126)
Anion gap: 10 (ref 5–15)
BUN: 12 mg/dL (ref 6–20)
CO2: 23 mmol/L (ref 22–32)
Calcium: 9.4 mg/dL (ref 8.9–10.3)
Chloride: 104 mmol/L (ref 98–111)
Creatinine, Ser: 0.6 mg/dL (ref 0.44–1.00)
GFR calc Af Amer: >60 mL/min (ref >60)
GFR calc non Af Amer: >60 mL/min (ref >60)
Glucose, Bld: 163 mg/dL — ABNORMAL HIGH (ref 70–99)
Potassium: 3.7 mmol/L (ref 3.5–5.1)
Sodium: 137 mmol/L (ref 135–145)
Total Bilirubin: 0.1 mg/dL — ABNORMAL LOW (ref 0.3–1.2)
Total Protein: 8.1 g/dL (ref 6.5–8.1)

## 2019-04-18 LAB — TYPE AND SCREEN
ABO/RH(D): O POS
Antibody Screen: NEGATIVE

## 2019-04-18 LAB — HEMOGLOBIN A1C
Hgb A1c MFr Bld: 6.2 % — ABNORMAL HIGH (ref 4.8–5.6)
Mean Plasma Glucose: 131.24 mg/dL

## 2019-04-18 LAB — GLUCOSE, CAPILLARY: Glucose-Capillary: 163 mg/dL — ABNORMAL HIGH (ref 70–99)

## 2019-04-19 LAB — NOVEL CORONAVIRUS, NAA (HOSP ORDER, SEND-OUT TO REF LAB; TAT 18-24 HRS): SARS-CoV-2, NAA: NOT DETECTED

## 2019-04-22 ENCOUNTER — Inpatient Hospital Stay (HOSPITAL_COMMUNITY): Payer: 59 | Admitting: Anesthesiology

## 2019-04-22 ENCOUNTER — Inpatient Hospital Stay (HOSPITAL_COMMUNITY)
Admission: RE | Admit: 2019-04-22 | Discharge: 2019-04-26 | DRG: 331 | Disposition: A | Discharge status: Home / Self Care | Payer: 59 | Attending: General Surgery | Admitting: General Surgery

## 2019-04-22 ENCOUNTER — Encounter (HOSPITAL_COMMUNITY): Payer: Self-pay | Admitting: *Deleted

## 2019-04-22 ENCOUNTER — Encounter (HOSPITAL_COMMUNITY)
Admission: RE | Disposition: A | Discharge status: Home / Self Care | Payer: Self-pay | Source: Home / Self Care | Attending: General Surgery

## 2019-04-22 ENCOUNTER — Other Ambulatory Visit (HOSPITAL_COMMUNITY): Payer: Self-pay | Admitting: Psychiatry

## 2019-04-22 ENCOUNTER — Other Ambulatory Visit: Payer: Self-pay

## 2019-04-22 DIAGNOSIS — K5900 Constipation, unspecified: Secondary | ICD-10-CM | POA: Diagnosis present

## 2019-04-22 DIAGNOSIS — I10 Essential (primary) hypertension: Secondary | ICD-10-CM | POA: Diagnosis present

## 2019-04-22 DIAGNOSIS — F329 Major depressive disorder, single episode, unspecified: Secondary | ICD-10-CM | POA: Diagnosis present

## 2019-04-22 DIAGNOSIS — Z833 Family history of diabetes mellitus: Secondary | ICD-10-CM

## 2019-04-22 DIAGNOSIS — Z1159 Encounter for screening for other viral diseases: Secondary | ICD-10-CM

## 2019-04-22 DIAGNOSIS — Z8249 Family history of ischemic heart disease and other diseases of the circulatory system: Secondary | ICD-10-CM | POA: Diagnosis not present

## 2019-04-22 DIAGNOSIS — Z9049 Acquired absence of other specified parts of digestive tract: Secondary | ICD-10-CM

## 2019-04-22 DIAGNOSIS — K589 Irritable bowel syndrome without diarrhea: Secondary | ICD-10-CM | POA: Diagnosis present

## 2019-04-22 DIAGNOSIS — K5792 Diverticulitis of intestine, part unspecified, without perforation or abscess without bleeding: Secondary | ICD-10-CM

## 2019-04-22 DIAGNOSIS — E039 Hypothyroidism, unspecified: Secondary | ICD-10-CM | POA: Diagnosis present

## 2019-04-22 DIAGNOSIS — K219 Gastro-esophageal reflux disease without esophagitis: Secondary | ICD-10-CM | POA: Diagnosis present

## 2019-04-22 DIAGNOSIS — F419 Anxiety disorder, unspecified: Secondary | ICD-10-CM | POA: Diagnosis present

## 2019-04-22 DIAGNOSIS — I2510 Atherosclerotic heart disease of native coronary artery without angina pectoris: Secondary | ICD-10-CM | POA: Diagnosis present

## 2019-04-22 DIAGNOSIS — E119 Type 2 diabetes mellitus without complications: Secondary | ICD-10-CM | POA: Diagnosis present

## 2019-04-22 DIAGNOSIS — K5732 Diverticulitis of large intestine without perforation or abscess without bleeding: Secondary | ICD-10-CM | POA: Diagnosis present

## 2019-04-22 DIAGNOSIS — M1990 Unspecified osteoarthritis, unspecified site: Secondary | ICD-10-CM | POA: Diagnosis present

## 2019-04-22 HISTORY — PX: LAPAROSCOPIC PARTIAL COLECTOMY: SHX5907

## 2019-04-22 LAB — GLUCOSE, CAPILLARY
Glucose-Capillary: 153 mg/dL — ABNORMAL HIGH (ref 70–99)
Glucose-Capillary: 215 mg/dL — ABNORMAL HIGH (ref 70–99)
Glucose-Capillary: 172 mg/dL — ABNORMAL HIGH (ref 70–99)
Glucose-Capillary: 133 mg/dL — ABNORMAL HIGH (ref 70–99)

## 2019-04-22 SURGERY — LAPAROSCOPIC PARTIAL COLECTOMY
Anesthesia: General | Site: Abdomen

## 2019-04-22 MED ORDER — ZOLPIDEM TARTRATE 5 MG PO TABS: 5.0000 mg | ORAL_TABLET | Freq: Every evening | ORAL | Status: DC | PRN

## 2019-04-22 MED ORDER — LEVOTHYROXINE SODIUM 100 MCG PO TABS
100.0000 ug | ORAL_TABLET | Freq: Every day | ORAL | Status: DC
  Administered 2019-04-23 – 2019-04-26 (×4): 100 ug via ORAL
  Filled 2019-04-22 (×4): qty 1

## 2019-04-22 MED ORDER — ONDANSETRON HCL 4 MG/2ML IJ SOLN
4.0000 mg | Freq: Four times a day (QID) | INTRAMUSCULAR | Status: DC | PRN
  Administered 2019-04-22 – 2019-04-23 (×2): 4 mg via INTRAVENOUS
  Filled 2019-04-22 (×2): qty 2

## 2019-04-22 MED ORDER — MIDAZOLAM HCL 5 MG/5ML IJ SOLN
INTRAMUSCULAR | Status: DC | PRN
  Administered 2019-04-22: 2 mg via INTRAVENOUS

## 2019-04-22 MED ORDER — DIPHENHYDRAMINE HCL 12.5 MG/5ML PO ELIX: 12.5000 mg | ORAL_SOLUTION | Freq: Four times a day (QID) | ORAL | Status: DC | PRN

## 2019-04-22 MED ORDER — HYDROMORPHONE HCL 1 MG/ML IJ SOLN
0.2500 mg | INTRAMUSCULAR | Status: DC | PRN
  Administered 2019-04-22: 0.5 mg via INTRAVENOUS
  Filled 2019-04-22: qty 0.5

## 2019-04-22 MED ORDER — DULOXETINE HCL 60 MG PO CPEP
60.0000 mg | ORAL_CAPSULE | Freq: Two times a day (BID) | ORAL | Status: DC
  Administered 2019-04-22 – 2019-04-26 (×8): 60 mg via ORAL
  Filled 2019-04-22 (×8): qty 1

## 2019-04-22 MED ORDER — FENTANYL CITRATE (PF) 250 MCG/5ML IJ SOLN
INTRAMUSCULAR | Status: AC
  Filled 2019-04-22: qty 5

## 2019-04-22 MED ORDER — KETOROLAC TROMETHAMINE 30 MG/ML IJ SOLN
INTRAMUSCULAR | Status: AC
  Filled 2019-04-22: qty 1

## 2019-04-22 MED ORDER — ACETAMINOPHEN 500 MG PO TABS
1000.0000 mg | ORAL_TABLET | ORAL | Status: AC
  Administered 2019-04-22: 1000 mg via ORAL

## 2019-04-22 MED ORDER — CHLORHEXIDINE GLUCONATE CLOTH 2 % EX PADS: 6.0000 | MEDICATED_PAD | Freq: Once | CUTANEOUS | Status: DC

## 2019-04-22 MED ORDER — PROPOFOL 10 MG/ML IV BOLUS
INTRAVENOUS | Status: AC
  Filled 2019-04-22: qty 40

## 2019-04-22 MED ORDER — ALBUTEROL SULFATE (2.5 MG/3ML) 0.083% IN NEBU: 3.0000 mL | INHALATION_SOLUTION | RESPIRATORY_TRACT | Status: DC | PRN

## 2019-04-22 MED ORDER — ACETAMINOPHEN 500 MG PO TABS
ORAL_TABLET | ORAL | Status: AC
  Filled 2019-04-22: qty 2

## 2019-04-22 MED ORDER — OXYBUTYNIN CHLORIDE 5 MG PO TABS
5.0000 mg | ORAL_TABLET | Freq: Three times a day (TID) | ORAL | Status: AC
  Administered 2019-04-22 – 2019-04-24 (×6): 5 mg via ORAL
  Filled 2019-04-22 (×6): qty 1

## 2019-04-22 MED ORDER — SODIUM CHLORIDE 0.9 % IR SOLN
Status: DC | PRN
  Administered 2019-04-22: 3000 mL

## 2019-04-22 MED ORDER — BUPIVACAINE LIPOSOME 1.3 % IJ SUSP
INTRAMUSCULAR | Status: DC | PRN
  Administered 2019-04-22: 20 mL

## 2019-04-22 MED ORDER — PANTOPRAZOLE SODIUM 40 MG IV SOLR
40.0000 mg | Freq: Every day | INTRAVENOUS | Status: DC
  Administered 2019-04-22 – 2019-04-25 (×4): 40 mg via INTRAVENOUS
  Filled 2019-04-22 (×4): qty 40

## 2019-04-22 MED ORDER — SODIUM CHLORIDE 0.9 % IV SOLN
INTRAVENOUS | Status: DC | PRN
  Administered 2019-04-22: 18:00:00 500 mL via INTRAVENOUS

## 2019-04-22 MED ORDER — INSULIN ASPART 100 UNIT/ML ~~LOC~~ SOLN
0.0000 [IU] | Freq: Three times a day (TID) | SUBCUTANEOUS | Status: DC
  Administered 2019-04-22: 3 [IU] via SUBCUTANEOUS
  Administered 2019-04-23 (×2): 2 [IU] via SUBCUTANEOUS
  Administered 2019-04-24 (×2): 3 [IU] via SUBCUTANEOUS
  Administered 2019-04-25: 2 [IU] via SUBCUTANEOUS

## 2019-04-22 MED ORDER — LACTATED RINGERS IV SOLN
INTRAVENOUS | Status: DC
  Administered 2019-04-22 – 2019-04-25 (×3): via INTRAVENOUS

## 2019-04-22 MED ORDER — ALVIMOPAN 12 MG PO CAPS
ORAL_CAPSULE | ORAL | Status: AC
  Filled 2019-04-22: qty 1

## 2019-04-22 MED ORDER — GABAPENTIN 300 MG PO CAPS
300.0000 mg | ORAL_CAPSULE | Freq: Two times a day (BID) | ORAL | Status: DC
  Administered 2019-04-22 – 2019-04-26 (×8): 300 mg via ORAL
  Filled 2019-04-22 (×8): qty 1

## 2019-04-22 MED ORDER — DEXAMETHASONE SODIUM PHOSPHATE 10 MG/ML IJ SOLN
INTRAMUSCULAR | Status: DC | PRN
  Administered 2019-04-22: 5 mg via INTRAVENOUS

## 2019-04-22 MED ORDER — SODIUM CHLORIDE 0.9 % IV SOLN
2.0000 g | INTRAVENOUS | Status: AC
  Administered 2019-04-22: 2 g via INTRAVENOUS

## 2019-04-22 MED ORDER — ROCURONIUM BROMIDE 10 MG/ML (PF) SYRINGE
PREFILLED_SYRINGE | INTRAVENOUS | Status: AC
  Filled 2019-04-22: qty 20

## 2019-04-22 MED ORDER — 0.9 % SODIUM CHLORIDE (POUR BTL) OPTIME
TOPICAL | Status: DC | PRN
  Administered 2019-04-22 (×2): 1000 mL

## 2019-04-22 MED ORDER — SUGAMMADEX SODIUM 200 MG/2ML IV SOLN
INTRAVENOUS | Status: DC | PRN
  Administered 2019-04-22 (×2): 100 mg via INTRAVENOUS

## 2019-04-22 MED ORDER — HEMOSTATIC AGENTS (NO CHARGE) OPTIME
TOPICAL | Status: DC | PRN
  Administered 2019-04-22: 1 via TOPICAL

## 2019-04-22 MED ORDER — LACTATED RINGERS IV SOLN
INTRAVENOUS | Status: DC
  Administered 2019-04-22 (×2): via INTRAVENOUS
  Administered 2019-04-22: 1000 mL via INTRAVENOUS

## 2019-04-22 MED ORDER — KETOROLAC TROMETHAMINE 30 MG/ML IJ SOLN
30.0000 mg | Freq: Four times a day (QID) | INTRAMUSCULAR | Status: DC | PRN
  Administered 2019-04-23 – 2019-04-24 (×2): 30 mg via INTRAVENOUS
  Filled 2019-04-22 (×2): qty 1

## 2019-04-22 MED ORDER — MORPHINE SULFATE (PF) 2 MG/ML IV SOLN
2.0000 mg | INTRAVENOUS | Status: DC | PRN
  Administered 2019-04-23: 2 mg via INTRAVENOUS
  Filled 2019-04-22: qty 1

## 2019-04-22 MED ORDER — ALPRAZOLAM 0.25 MG PO TABS: 0.2500 mg | ORAL_TABLET | Freq: Two times a day (BID) | ORAL | 0 refills | Status: DC | PRN

## 2019-04-22 MED ORDER — ROCURONIUM BROMIDE 10 MG/ML (PF) SYRINGE
PREFILLED_SYRINGE | INTRAVENOUS | Status: AC
  Filled 2019-04-22: qty 10

## 2019-04-22 MED ORDER — KETOROLAC TROMETHAMINE 30 MG/ML IJ SOLN
30.0000 mg | Freq: Four times a day (QID) | INTRAMUSCULAR | Status: AC
  Administered 2019-04-22: 12:00:00 30 mg via INTRAVENOUS

## 2019-04-22 MED ORDER — METHOCARBAMOL 500 MG PO TABS
500.0000 mg | ORAL_TABLET | Freq: Four times a day (QID) | ORAL | Status: DC | PRN
  Administered 2019-04-22: 500 mg via ORAL
  Filled 2019-04-22: qty 1

## 2019-04-22 MED ORDER — PROMETHAZINE HCL 25 MG/ML IJ SOLN: 6.2500 mg | INTRAMUSCULAR | Status: DC | PRN

## 2019-04-22 MED ORDER — TRAZODONE HCL 50 MG PO TABS
150.0000 mg | ORAL_TABLET | Freq: Every evening | ORAL | Status: DC | PRN
  Administered 2019-04-22 – 2019-04-26 (×4): 150 mg via ORAL
  Filled 2019-04-22 (×4): qty 3

## 2019-04-22 MED ORDER — SODIUM CHLORIDE 0.9 % IV SOLN
2.0000 g | Freq: Two times a day (BID) | INTRAVENOUS | Status: AC
  Administered 2019-04-22 – 2019-04-24 (×5): 2 g via INTRAVENOUS
  Filled 2019-04-22 (×6): qty 2

## 2019-04-22 MED ORDER — HEPARIN SODIUM (PORCINE) 5000 UNIT/ML IJ SOLN
5000.0000 [IU] | Freq: Three times a day (TID) | INTRAMUSCULAR | Status: DC
  Administered 2019-04-23 – 2019-04-26 (×10): 5000 [IU] via SUBCUTANEOUS
  Filled 2019-04-22 (×10): qty 1

## 2019-04-22 MED ORDER — DIPHENHYDRAMINE HCL 50 MG/ML IJ SOLN: 12.5000 mg | Freq: Four times a day (QID) | INTRAMUSCULAR | Status: DC | PRN

## 2019-04-22 MED ORDER — ACETAMINOPHEN 500 MG PO TABS
1000.0000 mg | ORAL_TABLET | Freq: Four times a day (QID) | ORAL | Status: DC
  Administered 2019-04-22 – 2019-04-26 (×11): 1000 mg via ORAL
  Filled 2019-04-22 (×13): qty 2

## 2019-04-22 MED ORDER — DEXAMETHASONE SODIUM PHOSPHATE 10 MG/ML IJ SOLN
INTRAMUSCULAR | Status: AC
  Filled 2019-04-22: qty 1

## 2019-04-22 MED ORDER — ALPRAZOLAM 0.25 MG PO TABS
0.2500 mg | ORAL_TABLET | Freq: Two times a day (BID) | ORAL | Status: DC | PRN
  Administered 2019-04-22: 0.25 mg via ORAL
  Filled 2019-04-22: qty 1

## 2019-04-22 MED ORDER — MECLIZINE HCL 12.5 MG PO TABS: 25.0000 mg | ORAL_TABLET | Freq: Three times a day (TID) | ORAL | Status: DC | PRN

## 2019-04-22 MED ORDER — SODIUM CHLORIDE 0.9 % IV SOLN
INTRAVENOUS | Status: AC
  Filled 2019-04-22: qty 2

## 2019-04-22 MED ORDER — MIDAZOLAM HCL 2 MG/2ML IJ SOLN: 0.5000 mg | Freq: Once | INTRAMUSCULAR | Status: DC | PRN

## 2019-04-22 MED ORDER — HYDROCODONE-ACETAMINOPHEN 7.5-325 MG PO TABS: 1.0000 | ORAL_TABLET | Freq: Once | ORAL | Status: DC | PRN

## 2019-04-22 MED ORDER — ROCURONIUM BROMIDE 100 MG/10ML IV SOLN
INTRAVENOUS | Status: DC | PRN
  Administered 2019-04-22: 20 mg via INTRAVENOUS
  Administered 2019-04-22: 10 mg via INTRAVENOUS
  Administered 2019-04-22: 50 mg via INTRAVENOUS

## 2019-04-22 MED ORDER — SUCCINYLCHOLINE CHLORIDE 20 MG/ML IJ SOLN
INTRAMUSCULAR | Status: DC | PRN
  Administered 2019-04-22: 160 mg via INTRAVENOUS

## 2019-04-22 MED ORDER — SUCCINYLCHOLINE CHLORIDE 200 MG/10ML IV SOSY
PREFILLED_SYRINGE | INTRAVENOUS | Status: AC
  Filled 2019-04-22: qty 10

## 2019-04-22 MED ORDER — ONDANSETRON 4 MG PO TBDP
4.0000 mg | ORAL_TABLET | Freq: Four times a day (QID) | ORAL | Status: DC | PRN
  Administered 2019-04-24: 15:00:00 4 mg via ORAL
  Filled 2019-04-22: qty 1

## 2019-04-22 MED ORDER — DOCUSATE SODIUM 100 MG PO CAPS
100.0000 mg | ORAL_CAPSULE | Freq: Two times a day (BID) | ORAL | Status: DC
  Administered 2019-04-22 – 2019-04-26 (×8): 100 mg via ORAL
  Filled 2019-04-22 (×8): qty 1

## 2019-04-22 MED ORDER — PROPOFOL 10 MG/ML IV BOLUS
INTRAVENOUS | Status: DC | PRN
  Administered 2019-04-22: 170 mg via INTRAVENOUS
  Administered 2019-04-22: 30 mg via INTRAVENOUS

## 2019-04-22 MED ORDER — METOPROLOL TARTRATE 5 MG/5ML IV SOLN: 5.0000 mg | Freq: Four times a day (QID) | INTRAVENOUS | Status: DC | PRN

## 2019-04-22 MED ORDER — OXYCODONE HCL 5 MG PO TABS
5.0000 mg | ORAL_TABLET | ORAL | Status: DC | PRN
  Administered 2019-04-22: 5 mg via ORAL
  Administered 2019-04-23: 15:00:00 10 mg via ORAL
  Administered 2019-04-23: 22:00:00 5 mg via ORAL
  Administered 2019-04-23: 10:00:00 10 mg via ORAL
  Filled 2019-04-22: qty 1
  Filled 2019-04-22: qty 2
  Filled 2019-04-22: qty 1
  Filled 2019-04-22: qty 2
  Filled 2019-04-22: qty 1

## 2019-04-22 MED ORDER — FENTANYL CITRATE (PF) 100 MCG/2ML IJ SOLN
INTRAMUSCULAR | Status: AC
  Filled 2019-04-22: qty 2

## 2019-04-22 MED ORDER — MIDAZOLAM HCL 2 MG/2ML IJ SOLN
INTRAMUSCULAR | Status: AC
  Filled 2019-04-22: qty 2

## 2019-04-22 MED ORDER — SIMETHICONE 80 MG PO CHEW
40.0000 mg | CHEWABLE_TABLET | Freq: Four times a day (QID) | ORAL | Status: DC | PRN
  Administered 2019-04-22 – 2019-04-23 (×3): 40 mg via ORAL
  Filled 2019-04-22 (×3): qty 1

## 2019-04-22 MED ORDER — FENTANYL CITRATE (PF) 100 MCG/2ML IJ SOLN
INTRAMUSCULAR | Status: DC | PRN
  Administered 2019-04-22 (×2): 50 ug via INTRAVENOUS
  Administered 2019-04-22: 100 ug via INTRAVENOUS
  Administered 2019-04-22 (×2): 50 ug via INTRAVENOUS

## 2019-04-22 MED ORDER — SUCCINYLCHOLINE CHLORIDE 200 MG/10ML IV SOSY
PREFILLED_SYRINGE | INTRAVENOUS | Status: AC
  Filled 2019-04-22: qty 20

## 2019-04-22 MED ORDER — BUPIVACAINE LIPOSOME 1.3 % IJ SUSP
INTRAMUSCULAR | Status: AC
  Filled 2019-04-22: qty 20

## 2019-04-22 MED ORDER — ALVIMOPAN 12 MG PO CAPS
12.0000 mg | ORAL_CAPSULE | Freq: Two times a day (BID) | ORAL | Status: DC
  Administered 2019-04-23 – 2019-04-24 (×3): 12 mg via ORAL
  Filled 2019-04-22 (×3): qty 1

## 2019-04-22 MED ORDER — ALVIMOPAN 12 MG PO CAPS
12.0000 mg | ORAL_CAPSULE | ORAL | Status: AC
  Administered 2019-04-22: 12 mg via ORAL

## 2019-04-22 SURGICAL SUPPLY — 73 items
APPLIER CLIP 5 13 M/L LIGAMAX5 (MISCELLANEOUS) ×2
APR CLP MED LRG 5 ANG JAW (MISCELLANEOUS) ×1
CELLS DAT CNTRL 66122 CELL SVR (MISCELLANEOUS) ×1 IMPLANT
CLIP APPLIE 5 13 M/L LIGAMAX5 (MISCELLANEOUS) IMPLANT
COVER LIGHT HANDLE STERIS (MISCELLANEOUS) ×6 IMPLANT
COVER WAND RF STERILE (DRAPES) ×1 IMPLANT
DRSG OPSITE POSTOP 4X8 (GAUZE/BANDAGES/DRESSINGS) ×2 IMPLANT
DRSG TEGADERM 2-3/8X2-3/4 SM (GAUZE/BANDAGES/DRESSINGS) ×1 IMPLANT
ELECT REM PT RETURN 9FT ADLT (ELECTROSURGICAL) ×2
ELECTRODE REM PT RTRN 9FT ADLT (ELECTROSURGICAL) ×1 IMPLANT
FILTER SMOKE EVAC LAPAROSHD (FILTER) ×2 IMPLANT
GLOVE BIO SURGEON STRL SZ 6.5 (GLOVE) ×4 IMPLANT
GLOVE BIO SURGEON STRL SZ7 (GLOVE) ×3 IMPLANT
GLOVE BIOGEL PI IND STRL 6.5 (GLOVE) ×2 IMPLANT
GLOVE BIOGEL PI IND STRL 7.0 (GLOVE) ×4 IMPLANT
GLOVE BIOGEL PI INDICATOR 6.5 (GLOVE) ×2
GLOVE BIOGEL PI INDICATOR 7.0 (GLOVE) ×4
GLOVE SURG SS PI 7.5 STRL IVOR (GLOVE) ×4 IMPLANT
GOWN STRL REUS W/ TWL XL LVL3 (GOWN DISPOSABLE) ×2 IMPLANT
GOWN STRL REUS W/TWL LRG LVL3 (GOWN DISPOSABLE) ×8 IMPLANT
GOWN STRL REUS W/TWL XL LVL3 (GOWN DISPOSABLE) ×4
HEMOSTAT SNOW SURGICEL 2X4 (HEMOSTASIS) ×1 IMPLANT
INST SET LAPROSCOPIC AP (KITS) ×2 IMPLANT
INST SET MAJOR GENERAL (KITS) ×2 IMPLANT
IV NS IRRIG 3000ML ARTHROMATIC (IV SOLUTION) ×1 IMPLANT
KIT BLADEGUARD II DBL (SET/KITS/TRAYS/PACK) ×1 IMPLANT
KIT TURNOVER CYSTO (KITS) ×2 IMPLANT
LIGASURE IMPACT 36 18CM CVD LR (INSTRUMENTS) ×2 IMPLANT
LIGASURE LAP ATLAS 10MM 37CM (INSTRUMENTS) ×2 IMPLANT
MANIFOLD NEPTUNE II (INSTRUMENTS) ×2 IMPLANT
NDL HYPO 18GX1.5 BLUNT FILL (NEEDLE) ×1 IMPLANT
NDL HYPO 21X1.5 SAFETY (NEEDLE) ×1 IMPLANT
NEEDLE HYPO 18GX1.5 BLUNT FILL (NEEDLE) ×2 IMPLANT
NEEDLE HYPO 21X1.5 SAFETY (NEEDLE) ×2 IMPLANT
NS IRRIG 1000ML POUR BTL (IV SOLUTION) ×3 IMPLANT
PACK COLON (CUSTOM PROCEDURE TRAY) ×2 IMPLANT
PAD ARMBOARD 7.5X6 YLW CONV (MISCELLANEOUS) ×2 IMPLANT
PENCIL HANDSWITCHING (ELECTRODE) ×2 IMPLANT
RELOAD LINEAR CUT PROX 55 BLUE (ENDOMECHANICALS) IMPLANT
RELOAD PROXIMATE 75MM BLUE (ENDOMECHANICALS) ×2 IMPLANT
RELOAD STAPLE 55 3.8 BLU REG (ENDOMECHANICALS) IMPLANT
RELOAD STAPLE 75 3.8 BLU REG (ENDOMECHANICALS) IMPLANT
RETRACTOR WND ALEXIS 18 MED (MISCELLANEOUS) IMPLANT
RTRCTR WOUND ALEXIS 18CM MED (MISCELLANEOUS) ×2
SET TUBE IRRIG SUCTION NO TIP (IRRIGATION / IRRIGATOR) ×1 IMPLANT
SHEET LAVH (DRAPES) ×1 IMPLANT
SLEEVE ENDOPATH XCEL 5M (ENDOMECHANICALS) ×1 IMPLANT
SPONGE GAUZE 2X2 8PLY STRL LF (GAUZE/BANDAGES/DRESSINGS) ×3 IMPLANT
SPONGE LAP 18X18 RF (DISPOSABLE) ×6 IMPLANT
STAPLER CIRC 28 3.5 SULU (STAPLE) ×1 IMPLANT
STAPLER CUT CVD 40MM GREEN (STAPLE) ×1 IMPLANT
STAPLER GUN LINEAR PROX 60 (STAPLE) ×2 IMPLANT
STAPLER PROXIMATE 75MM BLUE (STAPLE) ×1 IMPLANT
STAPLER RELOADABLE 65 2-0 SUT (MISCELLANEOUS) IMPLANT
STAPLER SYS INTERNAL RELOAD SS (MISCELLANEOUS) ×2 IMPLANT
STAPLER VISISTAT (STAPLE) ×2 IMPLANT
SURGILUBE 3G PEEL PACK STRL (MISCELLANEOUS) ×1 IMPLANT
SUT PDS AB CT VIOLET #0 27IN (SUTURE) ×4 IMPLANT
SUT PROLENE 2 0 SH 30 (SUTURE) ×2 IMPLANT
SUT SILK 2 0 (SUTURE)
SUT SILK 2-0 18XBRD TIE 12 (SUTURE) IMPLANT
SUT SILK 3 0 SH CR/8 (SUTURE) ×3 IMPLANT
SUT VIC AB 3-0 SH 27 (SUTURE) ×2
SUT VIC AB 3-0 SH 27X BRD (SUTURE) ×1 IMPLANT
SYR 10ML LL (SYRINGE) ×2 IMPLANT
TAPE PAPER 2X10 WHT MICROPORE (GAUZE/BANDAGES/DRESSINGS) ×1 IMPLANT
TRAY FOLEY MTR SLVR 16FR STAT (SET/KITS/TRAYS/PACK) ×2 IMPLANT
TROCAR ENDO BLADELESS 11MM (ENDOMECHANICALS) ×2 IMPLANT
TROCAR XCEL NON-BLD 5MMX100MML (ENDOMECHANICALS) ×2 IMPLANT
TROCAR XCEL UNIV SLVE 11M 100M (ENDOMECHANICALS) ×2 IMPLANT
TUBING INSUFFLATION (TUBING) ×2 IMPLANT
WARMER LAPAROSCOPE (MISCELLANEOUS) ×2 IMPLANT
YANKAUER SUCT BULB TIP 10FT TU (MISCELLANEOUS) ×4 IMPLANT

## 2019-04-22 NOTE — Anesthesia Procedure Notes (Signed)
Procedure Name: Intubation Date/Time: 04/22/2019 7:44 AM Performed by: Charmaine Downs, CRNA Pre-anesthesia Checklist: Patient identified, Patient being monitored, Timeout performed, Emergency Drugs available and Suction available Patient Re-evaluated:Patient Re-evaluated prior to induction Oxygen Delivery Method: Circle System Utilized Preoxygenation: Pre-oxygenation with 100% oxygen Induction Type: IV induction Ventilation: Mask ventilation without difficulty Laryngoscope Size: Mac and 3 Grade View: Grade II Tube type: Oral Tube size: 7.0 mm Number of attempts: 1 Airway Equipment and Method: stylet Placement Confirmation: ETT inserted through vocal cords under direct vision,  positive ETCO2 and breath sounds checked- equal and bilateral Secured at: 22 cm Tube secured with: Tape Dental Injury: Teeth and Oropharynx as per pre-operative assessment

## 2019-04-22 NOTE — Progress Notes (Signed)
[facility]  Doing well post op. No complaints. Throat sore.   BP 117/66 (BP Location: Left Arm)   Pulse 96   Temp 98.5 F (36.9 C) (Oral)   Resp 16   Ht 5\' 6"  (1.676 m)   Wt 108 kg   LMP 03/27/2014 Comment: irregular  SpO2 95%   BMI 38.41 kg/m   Ordered IS Ordered Home meds Clears but go slow  Curlene Labrum, MD [facility] [place], [place] [address] [phone] (office)

## 2019-04-22 NOTE — Transfer of Care (Signed)
Immediate Anesthesia Transfer of Care Note  Patient: Kristin Bailey  Procedure(s) Performed: LAPAROSCOPIC PARTIAL SIGMOID COLECTOMY (N/A Abdomen)  Patient Location: PACU  Anesthesia Type:General  Level of Consciousness: drowsy  Airway & Oxygen Therapy: Patient Spontanous Breathing and Patient connected to face mask oxygen  Post-op Assessment: Report given to RN and Post -op Vital signs reviewed and stable  Post vital signs: Reviewed and stable  Last Vitals:  Vitals Value Taken Time  BP    Temp    Pulse 106 04/22/2019 11:33 AM  Resp    SpO2 99 % 04/22/2019 11:33 AM  Vitals shown include unvalidated device data.  Last Pain:  Vitals:   04/22/19 0655  TempSrc: Oral  PainSc: 0-No pain      Patients Stated Pain Goal: 8 (85/63/14 9702)  Complications: No apparent anesthesia complications

## 2019-04-22 NOTE — Progress Notes (Addendum)
Arrived to room from pacu after 1300.  Somewhat drowsy but oriented and talking to husband on cell phone.  Vitals stable and saturations in low 90's on room air.  Three lap sites plus midline honeycomb dressings dry and intact and says pain about a three and is mostly a discomfort.  Abd binder placed.  Foley draining clear yellow urine.  Asked for gingerale to drink.

## 2019-04-22 NOTE — Progress Notes (Signed)
Drank about 690 mls since surgery  C/O nausea and headache. Received zofran and was assisted to bedside commode and had brown liquid bm with red streaks and a few small solid pieces.  Also, asked for xanax.  Surgical sites dry and intact.

## 2019-04-22 NOTE — Progress Notes (Signed)
[facility]  Notified patient's husband of surgery completion. Clears today. Monitor. Expect likely a bloody BM.   Curlene Labrum, MD [facility] [address] [place], [place] [address] [phone] (office)

## 2019-04-22 NOTE — Progress Notes (Signed)
[facility]  Patient's history and physical reviewed. No changes or questions noted.  Bowel prep completed.  Curlene Labrum, MD [facility] [address] [place], [place] [address] [phone] (office)

## 2019-04-22 NOTE — Op Note (Signed)
[facility] Operative Note  04/22/19  Preoperative Diagnosis: Sigmoid diverticulitis    Postoperative Diagnosis: Same   Procedure(s) Performed:  Laparoscopic sigmoid colectomy (EEA 28 stapler primary anastomosis)    Surgeon: Lanell Matar. Constance Haw, MD   Assistants: Aviva Signs, MD    Anesthesia: General endotracheal   Anesthesiologist: Lenice Llamas, MD    Specimens: Sigmoid colon, suture marks proximal    Estimated Blood Loss: 125cc    Blood Replacement: None    Complications: None   Wound Class: Clean contaminated    Operative Indications:  Kristin Bailey is a 58 yo who has had multiple of episodes of colitis/ diverticulitis in the area of her sigmoid colon and has been managed with antibiotics and improved. She had her last episode a few months back and given the multiple episodes has decided to proceed with colectomy.  She underwent a work up with Dr. Laural Golden back in December of 2018 including sigmoidoscopy and barium enema that demonstrated no stricture and findings of diverticula in the area of concern.  We have discussed the risk of partial colectomy including but not limited to bleeding, infection, leak from the anastomosis, injury to other organs or ureter, and need for further surgery or procedures.  She has opted to proceed.    Findings: Thickened area of sigmoid colon with adhesion to the left pelvic side wall    Procedure: The patient was taken to the operating room and placed supine. General endotracheal anesthesia was induced. Intravenous antibiotics were administered per protocol.  An orogastric tube positioned to decompress the stomach.  She was then position in lithotomy with all pressure points padded and the right arm tucked. The abdomen and perineum were prepared and draped in the usual sterile fashion.  A JACHO approved timeout was performed.    A supraumbilical incision was made in the midline, and a towel clip was used to elevate the abdominal  wall. The Veress needle was and the saline drop test ensured position into the peritoneum.  Low insufflation pressures were noted and the abdomen insufflated evenly.  Once 15 mm of pneumoperitoneum was achieved a 11 mm Optiview port was used to enter the abdominal cavity.  There was no evidence of any injury from the Veress needle of trocar insertion.  Additional ports were placed under direct visualization in the right lower quadrant 11 mm and the right mid abdomen 17mm.  An assistant 5 mm port was placed in the left mid abdomen.  The patient was placed in Trendelenburg position and left side up to aid in retraction of the small bowel to the right upper quadrant.  The cecum was very redundant and in the pelvis and was also tucked back into the right side.    Dr. Arnoldo Morale grasped the sigmoid colon and there were obvious adhesions to the pelvic anterior pelvic wall adjacent to the ovary. Care was taken to dissect the colon off the side wall with a combination of Ligasure and scissor dissection. Once this was performed we could elevate the distal sigmoid colon out of the pelvis and there were some minor adhesions of the sigmoid to the ureters which were taken down with the Ligasure and scissor dissection.  The patient was oozing in general from all surfaces that were touched but hemostasis was achieved with cautery.  Once these adhesions were down in the pelvis the white line of Toldt was taken down laterally and carried up toward the splenic flexure to aid in mobilization of the colon.  I remained in the avascular plane.  The left ureter was identified and protected. There was some minor bleeding from the anterior abdominal wall approximately 3 cm above the ureter that was clipped with a 5 mm clip and the bleeding was controlled.  This area remained oozy even after clipping.  Again the ureter was below and protected.  There was adequate mobilization to proceed with open resection of the sigmoid colon.   The  supraumbilical incision was elongated inferiorly to allow for the small wound protector to be placed.  The remaining trocars were removed and pneumoperitoneum was released.  Laparotomy pads were placed to further help reduce the small bowel and cecum into the right upper quadrant outside of the operative field.  I was able to grasp the thickened sigmoid and exteriorize the proximal soft portion.  This was divided with a linear cutting 75 mm stapler.  The distal margin was palpated ensuring soft non diseased colon.  Using a contoured TA stapler, I was able to get around the distal margin, and the colon was divided.  The mesentery was taken down with a Ligasure and the left ureter was identified and protected the entire time.    My partner, Dr. Arnoldo Morale went below and EEA sizers were placed. The 29 sizer went smoothly.  We opted to use the 28 mm EEA stapler.  The field was draped with additional blue towels. The proximal colon staple line was cleared of mesentery and fatty tissue. The automatic pursestringer was and the anvil was placed in a small open in the proximal colon. Dr. Arnoldo Morale placed the EEA stapler into the anus and I assisted in guiding him to the distal staple line.  The spike was deployed and the initial point of deployment was lateral on the staple line, which we worried would prevent adequate donut formation with the anastomosis. The spike was retracted, and I repaired the colotomy with a full thickeness 3-0 Silk suture and oversewed the area with multiple 3-0 Lemberted sutures. The spike was then redeployed in central just anterior to the staple line.  I attached the anvil to the spike, and Dr. Arnoldo Morale started to close the stapler as I watched.  I heard a pop and noted the right side wall of the proximal colon to be floppy and the pursestring to be disrupted.  It appeared that the auto-pursestring nylon had popped with the attempted closure of the stapler.  Given this, we quickly retracted the spike,  and I grabbed the proximal end with Alis clamps and removed the anvil.  There was minimal spillage.  I then proceeded to perform a pursestring with a 2-0 prolene suture and performed a second pursestring to ensure that the anvil would remain in place.  The spike and anvil were then reattached and the stapler was closed under direct visualization.  There was nothing in the staple line, and the stapler was fired and carefully retracted.    I compressed the proximal colon, and Dr. Arnoldo Morale insufflated the rectum with air. I could feel the area coming past the anastomosis to my fingers, and no bubbles were noted. No leak was identified.  The entire anterior staple line was oversewed with 3-0 Silk suture.  The anastomosis was under no tension and the bowel was healthy and pink and not twisted. The pelvis was irrigated with warm saline. The left pelvic side wall just above the ureter was still slightly oozing, so surgical Emogene Morgan was placed.  The omentum was pulled down into the  pelvis to cover the anastomosis.    The entire team changed gowns and gloves and closing instruments were used.  The fascia was closed with 0 PDS suture. Final inspection revealed hemostasis. Xparel was injected. The subcutaneous tissue was irrigated. The skin was closed with staples and dressings were applied.    Dr. Arnoldo Morale was assisting throughout the procedure. All counts were correct at the end of the case. The patient was awakened from anesthesia and extubated without complication.  The patient went to the PACU in stable condition.   Curlene Labrum, MD [facility] [address] [place], [place] [address] [phone] (office)

## 2019-04-22 NOTE — Anesthesia Preprocedure Evaluation (Signed)
Anesthesia Evaluation  Patient identified by MRN, date of birth, ID band Patient awake    Reviewed: Allergy & Precautions, NPO status , Patient's Chart, lab work & pertinent test results  Airway Mallampati: I  TM Distance: >3 FB Neck ROM: Full    Dental no notable dental hx. (+) Teeth Intact   Pulmonary pneumonia, resolved,    Pulmonary exam normal breath sounds clear to auscultation       Cardiovascular hypertension, Pt. on medications + CAD  Normal cardiovascular examI Rhythm:Regular Rate:Normal  States ET limited by Bilat knee pain  Denies CP/DOE   Neuro/Psych Anxiety Depression negative neurological ROS  negative psych ROS   GI/Hepatic Neg liver ROS, GERD  Medicated and Controlled,  Endo/Other  diabetes, Type 2, Oral Hypoglycemic AgentsHypothyroidism   Renal/GU negative Renal ROS  negative genitourinary   Musculoskeletal  (+) Arthritis , Osteoarthritis,    Abdominal   Peds negative pediatric ROS (+)  Hematology negative hematology ROS (+)   Anesthesia Other Findings   Reproductive/Obstetrics negative OB ROS                             Anesthesia Physical Anesthesia Plan  ASA: III  Anesthesia Plan: General   Post-op Pain Management:    Induction: Intravenous  PONV Risk Score and Plan:   Airway Management Planned: Oral ETT  Additional Equipment:   Intra-op Plan:   Post-operative Plan: Extubation in OR  Informed Consent: I have reviewed the patients History and Physical, chart, labs and discussed the procedure including the risks, benefits and alternatives for the proposed anesthesia with the patient or authorized representative who has indicated his/her understanding and acceptance.     Dental advisory given  Plan Discussed with: CRNA  Anesthesia Plan Comments: (Plan Full PPE use  Plan GETa -D/W pt -WTP with same after Q&A)        Anesthesia Quick  Evaluation

## 2019-04-22 NOTE — Progress Notes (Signed)
Eyeglasses, cellphone and suitcase given to pt. Transferred to room with pt.

## 2019-04-22 NOTE — Anesthesia Postprocedure Evaluation (Signed)
Anesthesia Post Note  Patient: Kristin Bailey  Procedure(s) Performed: LAPAROSCOPIC PARTIAL SIGMOID COLECTOMY (N/A Abdomen)  Patient location during evaluation: PACU Anesthesia Type: General Level of consciousness: patient cooperative and awake Pain management: pain level controlled Vital Signs Assessment: post-procedure vital signs reviewed and stable Respiratory status: spontaneous breathing, nonlabored ventilation, respiratory function stable and patient connected to nasal cannula oxygen Cardiovascular status: blood pressure returned to baseline Postop Assessment: no apparent nausea or vomiting Anesthetic complications: no     Last Vitals:  Vitals:   04/22/19 0722 04/22/19 0730  BP: 110/72 110/72  Resp: (!) 22 (!) 27  Temp:    SpO2: 96% 96%    Last Pain:  Vitals:   04/22/19 0655  TempSrc: Oral  PainSc: 0-No pain                 Dalya Maselli J

## 2019-04-23 ENCOUNTER — Encounter (HOSPITAL_COMMUNITY): Payer: Self-pay | Admitting: General Surgery

## 2019-04-23 LAB — CBC WITH DIFFERENTIAL/PLATELET
Abs Immature Granulocytes: 0.14 10*3/uL — ABNORMAL HIGH (ref 0.00–0.07)
Basophils Absolute: 0 10*3/uL (ref 0.0–0.1)
Basophils Relative: 0 %
Eosinophils Absolute: 0 10*3/uL (ref 0.0–0.5)
Eosinophils Relative: 0 %
HCT: 36.7 % (ref 36.0–46.0)
Hemoglobin: 11.9 g/dL — ABNORMAL LOW (ref 12.0–15.0)
Immature Granulocytes: 1 %
Lymphocytes Relative: 16 %
Lymphs Abs: 2.9 10*3/uL (ref 0.7–4.0)
MCH: 27.5 pg (ref 26.0–34.0)
MCHC: 32.4 g/dL (ref 30.0–36.0)
MCV: 85 fL (ref 80.0–100.0)
Monocytes Absolute: 1.2 10*3/uL — ABNORMAL HIGH (ref 0.1–1.0)
Monocytes Relative: 7 %
Neutro Abs: 13.6 10*3/uL — ABNORMAL HIGH (ref 1.7–7.7)
Neutrophils Relative %: 76 %
Platelets: 232 10*3/uL (ref 150–400)
RBC: 4.32 MIL/uL (ref 3.87–5.11)
RDW: 14.3 % (ref 11.5–15.5)
WBC: 17.9 10*3/uL — ABNORMAL HIGH (ref 4.0–10.5)
nRBC: 0 % (ref 0.0–0.2)

## 2019-04-23 LAB — BASIC METABOLIC PANEL
Anion gap: 8 (ref 5–15)
BUN: 9 mg/dL (ref 6–20)
CO2: 27 mmol/L (ref 22–32)
Calcium: 8.5 mg/dL — ABNORMAL LOW (ref 8.9–10.3)
Chloride: 103 mmol/L (ref 98–111)
Creatinine, Ser: 0.57 mg/dL (ref 0.44–1.00)
GFR calc Af Amer: >60 mL/min (ref >60)
GFR calc non Af Amer: >60 mL/min (ref >60)
Glucose, Bld: 137 mg/dL — ABNORMAL HIGH (ref 70–99)
Potassium: 3.6 mmol/L (ref 3.5–5.1)
Sodium: 138 mmol/L (ref 135–145)

## 2019-04-23 LAB — GLUCOSE, CAPILLARY
Glucose-Capillary: 118 mg/dL — ABNORMAL HIGH (ref 70–99)
Glucose-Capillary: 144 mg/dL — ABNORMAL HIGH (ref 70–99)
Glucose-Capillary: 140 mg/dL — ABNORMAL HIGH (ref 70–99)
Glucose-Capillary: 94 mg/dL (ref 70–99)

## 2019-04-23 LAB — MAGNESIUM: Magnesium: 1.6 mg/dL — ABNORMAL LOW (ref 1.7–2.4)

## 2019-04-23 LAB — PHOSPHORUS: Phosphorus: 3.4 mg/dL (ref 2.5–4.6)

## 2019-04-23 MED ORDER — MAGNESIUM SULFATE 4 GM/100ML IV SOLN
4.0000 g | Freq: Once | INTRAVENOUS | Status: AC
  Administered 2019-04-23: 4 g via INTRAVENOUS
  Filled 2019-04-23: qty 100

## 2019-04-23 MED ORDER — POTASSIUM CHLORIDE CRYS ER 20 MEQ PO TBCR
40.0000 meq | EXTENDED_RELEASE_TABLET | Freq: Once | ORAL | Status: AC
  Administered 2019-04-23: 09:00:00 40 meq via ORAL
  Filled 2019-04-23: qty 2

## 2019-04-23 NOTE — Progress Notes (Signed)
Foley catheter removed per order. Pt able to ambulate to bathroom and urinate. Will continue to monitor.

## 2019-04-23 NOTE — Progress Notes (Signed)
[facility] Progress Note  1 Day Post-Op  Subjective: No complaints this AM. Started oxybutynin for bladder pain/ foley pain last night. Otherwise doing well. Tolerated some clears and had 2 Bms that were slightly bloody.   Objective: Vital signs in last 24 hours: Temp:  [98.2 F (36.8 C)-99.7 F (37.6 C)] 98.4 F (36.9 C) (06/09 0650) Pulse Rate:  [83-106] 83 (06/09 0650) Resp:  [0-26] 18 (06/09 0650) BP: (92-143)/(49-79) 101/49 (06/09 0653) SpO2:  [92 %-100 %] 92 % (06/09 0650) Last BM Date: 04/22/19  Intake/Output from previous day: 06/08 0701 - 06/09 0700 In: 4498.8 [P.O.:690; I.V.:3708.8; IV Piggyback:100] Out: 2025 [Urine:1900; Blood:125] Intake/Output this shift: Total I/O In: 480 [P.O.:480] Out: 150 [Urine:150]  General appearance: alert, cooperative and no distress Resp: normal work of breathing GI: soft, mildly distended, appropriately tender, incisions c/d/i with honeycomb dressing and 2X2 dressings, Abd binder in place  Extremities: extremities normal, atraumatic, no cyanosis or edema  Lab Results:  Recent Labs    04/23/19 0631  WBC 17.9*  HGB 11.9*  HCT 36.7  PLT 232   BMET Recent Labs    04/23/19 0631  NA 138  K 3.6  CL 103  CO2 27  GLUCOSE 137*  BUN 9  CREATININE 0.57  CALCIUM 8.5*     Assessment/Plan: Kristin Bailey is a 58 yo who diverticulitis POD 1 s/p Laparoscopic assisted sigmoid colectomy. She is doing fair overall.  -Scheduled toradol and tylenol/ gabapentin scheduled, and PRN roxicodone/ morphine -IS, OOB, pulling > 2000 on IS -Ambulate today  -BP slightly low, holding BP meds, PRN metoprolol, continue monitor for today  -Clear diet for now, will continue the entereg as she had some residual stool in rectum that is likely what she passed yesterday -Mg and K replaced, LR @ 75 -Leukocytosis but post op, no shift, Cefotetan post op for 5 doses for minor spillage of stool with anastomosis  -H&H drifting, no major  signs of bleeding, monitor -Insulin for SSI  -SCDs, heparin sq  -Home Cymbalta, levothroxaine, xanax, trazodone for sleep   LOS: 1 day    Virl Cagey 04/23/2019

## 2019-04-24 LAB — CBC WITH DIFFERENTIAL/PLATELET
Abs Immature Granulocytes: 0.14 10*3/uL — ABNORMAL HIGH (ref 0.00–0.07)
Basophils Absolute: 0.1 10*3/uL (ref 0.0–0.1)
Basophils Relative: 0 %
Eosinophils Absolute: 0.3 10*3/uL (ref 0.0–0.5)
Eosinophils Relative: 2 %
HCT: 34.5 % — ABNORMAL LOW (ref 36.0–46.0)
Hemoglobin: 10.8 g/dL — ABNORMAL LOW (ref 12.0–15.0)
Immature Granulocytes: 1 %
Lymphocytes Relative: 12 %
Lymphs Abs: 2.1 10*3/uL (ref 0.7–4.0)
MCH: 27.2 pg (ref 26.0–34.0)
MCHC: 31.3 g/dL (ref 30.0–36.0)
MCV: 86.9 fL (ref 80.0–100.0)
Monocytes Absolute: 1.2 10*3/uL — ABNORMAL HIGH (ref 0.1–1.0)
Monocytes Relative: 6 %
Neutro Abs: 14.4 10*3/uL — ABNORMAL HIGH (ref 1.7–7.7)
Neutrophils Relative %: 79 %
Platelets: 230 10*3/uL (ref 150–400)
RBC: 3.97 MIL/uL (ref 3.87–5.11)
RDW: 14.3 % (ref 11.5–15.5)
WBC: 18.1 10*3/uL — ABNORMAL HIGH (ref 4.0–10.5)
nRBC: 0 % (ref 0.0–0.2)

## 2019-04-24 LAB — MAGNESIUM: Magnesium: 1.9 mg/dL (ref 1.7–2.4)

## 2019-04-24 LAB — GLUCOSE, CAPILLARY
Glucose-Capillary: 108 mg/dL — ABNORMAL HIGH (ref 70–99)
Glucose-Capillary: 162 mg/dL — ABNORMAL HIGH (ref 70–99)
Glucose-Capillary: 153 mg/dL — ABNORMAL HIGH (ref 70–99)
Glucose-Capillary: 105 mg/dL — ABNORMAL HIGH (ref 70–99)

## 2019-04-24 LAB — PHOSPHORUS: Phosphorus: 2.6 mg/dL (ref 2.5–4.6)

## 2019-04-24 LAB — BASIC METABOLIC PANEL
Anion gap: 8 (ref 5–15)
BUN: 5 mg/dL — ABNORMAL LOW (ref 6–20)
CO2: 26 mmol/L (ref 22–32)
Calcium: 8.3 mg/dL — ABNORMAL LOW (ref 8.9–10.3)
Chloride: 104 mmol/L (ref 98–111)
Creatinine, Ser: 0.51 mg/dL (ref 0.44–1.00)
GFR calc Af Amer: >60 mL/min (ref >60)
GFR calc non Af Amer: >60 mL/min (ref >60)
Glucose, Bld: 118 mg/dL — ABNORMAL HIGH (ref 70–99)
Potassium: 3.6 mmol/L (ref 3.5–5.1)
Sodium: 138 mmol/L (ref 135–145)

## 2019-04-24 MED ORDER — POTASSIUM CHLORIDE CRYS ER 20 MEQ PO TBCR
40.0000 meq | EXTENDED_RELEASE_TABLET | Freq: Once | ORAL | Status: AC
  Administered 2019-04-24: 11:00:00 40 meq via ORAL
  Filled 2019-04-24: qty 2

## 2019-04-24 MED ORDER — IBUPROFEN 600 MG PO TABS
600.0000 mg | ORAL_TABLET | Freq: Four times a day (QID) | ORAL | Status: DC
  Administered 2019-04-24 – 2019-04-26 (×7): 600 mg via ORAL
  Filled 2019-04-24 (×7): qty 1

## 2019-04-24 NOTE — Progress Notes (Signed)
[facility] Progress Note  2 Days Post-Op  Subjective: Doing fair. Having flatus and BMs. Says she is sore. Has been ambulating. Reports low grade temp to 100.9 overnight. Leukocytosis persist post op but not increasing.   Objective: Vital signs in last 24 hours: Temp:  [98.5 F (36.9 C)-100.9 F (38.3 C)] 99.3 F (37.4 C) (06/10 1400) Pulse Rate:  [84-90] 90 (06/10 1400) Resp:  [17-20] 18 (06/10 1400) BP: (92-129)/(54-74) 92/57 (06/10 1400) SpO2:  [91 %-96 %] 91 % (06/10 1400) Last BM Date: 04/22/19  Intake/Output from previous day: 06/09 0701 - 06/10 0700 In: 2667.6 [P.O.:1270; I.V.:1297.6; IV Piggyback:100] Out: 1400 [Urine:1400] Intake/Output this shift: Total I/O In: 840 [P.O.:840] Out: 1050 [Urine:1050]  General appearance: alert, cooperative and no distress Resp: normal work of breathing GI: soft, nondistended, appropriately tender, honeycomb in place no erythema or drainage, port site dressing clean  Lab Results:  Recent Labs    04/23/19 0631 04/24/19 0612  WBC 17.9* 18.1*  HGB 11.9* 10.8*  HCT 36.7 34.5*  PLT 232 230   BMET Recent Labs    04/23/19 0631 04/24/19 0612  NA 138 138  K 3.6 3.6  CL 103 104  CO2 27 26  GLUCOSE 137* 118*  BUN 9 5*  CREATININE 0.57 0.51  CALCIUM 8.5* 8.3*     Assessment/Plan: Kristin Bailey is a 58 yo who diverticulitis POD 2 s/p Laparoscopic assisted sigmoid colectomy.  -Scheduled toradol and tylenol, stopping the toradol and making it ibuprofen, gabapentin scheduled, and PRN roxicodone/ morphine -IS, OOB -Ambulate today  -BP slightly low, monitor  -BMs, Soft diet for now as she is having flatus and had a liquid BM today, will stop the entereg  -K replaced, LR @ 75 -Leukocytosis post op, no shift, Cefotetan post op for 5 doses for minor spillage of stool with anastomosis  -H&H drifting, no signs of bleeding, monitor -Insulin for SSI  -SCDs, heparin sq  -Home Cymbalta, levothroxaine, xanax, trazodone  for sleep   LOS: 2 days    Virl Cagey 04/24/2019

## 2019-04-24 NOTE — Progress Notes (Signed)
Patient ambulated in hallway with standby assist; pt ambulated from room to nurse's station and back to room; pt denies any pain just states she is "sore"; pt back to bed and soft diet ordered; call bell within reach and pt encouraged to call for assistance; will continue to monitor.  [name], RN

## 2019-04-25 LAB — BASIC METABOLIC PANEL
Anion gap: 9 (ref 5–15)
BUN: 6 mg/dL (ref 6–20)
CO2: 26 mmol/L (ref 22–32)
Calcium: 8.4 mg/dL — ABNORMAL LOW (ref 8.9–10.3)
Chloride: 107 mmol/L (ref 98–111)
Creatinine, Ser: 0.51 mg/dL (ref 0.44–1.00)
GFR calc Af Amer: >60 mL/min (ref >60)
GFR calc non Af Amer: >60 mL/min (ref >60)
Glucose, Bld: 113 mg/dL — ABNORMAL HIGH (ref 70–99)
Potassium: 4 mmol/L (ref 3.5–5.1)
Sodium: 142 mmol/L (ref 135–145)

## 2019-04-25 LAB — GLUCOSE, CAPILLARY
Glucose-Capillary: 103 mg/dL — ABNORMAL HIGH (ref 70–99)
Glucose-Capillary: 143 mg/dL — ABNORMAL HIGH (ref 70–99)
Glucose-Capillary: 108 mg/dL — ABNORMAL HIGH (ref 70–99)
Glucose-Capillary: 117 mg/dL — ABNORMAL HIGH (ref 70–99)

## 2019-04-25 LAB — CBC WITH DIFFERENTIAL/PLATELET
Abs Immature Granulocytes: 0.09 10*3/uL — ABNORMAL HIGH (ref 0.00–0.07)
Basophils Absolute: 0.1 10*3/uL (ref 0.0–0.1)
Basophils Relative: 0 %
Eosinophils Absolute: 0.6 10*3/uL — ABNORMAL HIGH (ref 0.0–0.5)
Eosinophils Relative: 4 %
HCT: 32.7 % — ABNORMAL LOW (ref 36.0–46.0)
Hemoglobin: 10.5 g/dL — ABNORMAL LOW (ref 12.0–15.0)
Immature Granulocytes: 1 %
Lymphocytes Relative: 18 %
Lymphs Abs: 2.7 10*3/uL (ref 0.7–4.0)
MCH: 27.5 pg (ref 26.0–34.0)
MCHC: 32.1 g/dL (ref 30.0–36.0)
MCV: 85.6 fL (ref 80.0–100.0)
Monocytes Absolute: 0.9 10*3/uL (ref 0.1–1.0)
Monocytes Relative: 6 %
Neutro Abs: 10.3 10*3/uL — ABNORMAL HIGH (ref 1.7–7.7)
Neutrophils Relative %: 71 %
Platelets: 240 10*3/uL (ref 150–400)
RBC: 3.82 MIL/uL — ABNORMAL LOW (ref 3.87–5.11)
RDW: 14.6 % (ref 11.5–15.5)
WBC: 14.6 10*3/uL — ABNORMAL HIGH (ref 4.0–10.5)
nRBC: 0 % (ref 0.0–0.2)

## 2019-04-25 LAB — PHOSPHORUS: Phosphorus: 3 mg/dL (ref 2.5–4.6)

## 2019-04-25 LAB — MAGNESIUM: Magnesium: 1.9 mg/dL (ref 1.7–2.4)

## 2019-04-25 NOTE — Progress Notes (Signed)
[facility] Progress Note  3 Days Post-Op  Subjective: Doing fair. Walking. Having Bms. Leukocytosis down. No fevers. Sore sometimes. Having Bms.   Objective: Vital signs in last 24 hours: Temp:  [97.7 F (36.5 C)-99.3 F (37.4 C)] 97.7 F (36.5 C) (06/11 0637) Pulse Rate:  [74-90] 74 (06/11 0637) Resp:  [16-18] 17 (06/11 0637) BP: (92-132)/(57-78) 132/78 (06/11 0637) SpO2:  [91 %-100 %] 96 % (06/11 0754) Last BM Date: 04/24/19  Intake/Output from previous day: 06/10 0701 - 06/11 0700 In: 1080 [P.O.:1080] Out: 1450 [Urine:1450] Intake/Output this shift: No intake/output data recorded.  General appearance: alert, cooperative and no distress Resp: normal work of breathing GI: soft, nondistended, appropriately tender, staples intact with no erythema or drainage  Lab Results:  Recent Labs    04/24/19 0612 04/25/19 0533  WBC 18.1* 14.6*  HGB 10.8* 10.5*  HCT 34.5* 32.7*  PLT 230 240   BMET Recent Labs    04/24/19 0612 04/25/19 0533  NA 138 142  K 3.6 4.0  CL 104 107  CO2 26 26  GLUCOSE 118* 113*  BUN 5* 6  CREATININE 0.51 0.51  CALCIUM 8.3* 8.4*     Assessment/Plan: Ms. Redmond School is a 58 yo who diverticulitis POD 3 s/p Laparoscopic assisted sigmoid colectomy.  -Scheduled tylenol and ibuprofen, PRN roxicodone/ morphine -BP 270B systolic   -Soft diet  -Stop LR, lytes good  -Leukocytosis post op going down, no shift, H&H stable  -Insulin for SSI  -SCDs, heparin sq  -Home Cymbalta, levothroxaine, xanax, trazodone for sleep  LOS: 3 days    Virl Cagey 04/25/2019

## 2019-04-25 NOTE — Progress Notes (Signed)
04/24/19 at 2030 patient walked around unit. Patient wore mask during walk and remained steady during her walk.  Patient tolerated procedure.

## 2019-04-26 LAB — GLUCOSE, CAPILLARY
Glucose-Capillary: 102 mg/dL — ABNORMAL HIGH (ref 70–99)
Glucose-Capillary: 122 mg/dL — ABNORMAL HIGH (ref 70–99)

## 2019-04-26 MED ORDER — DOCUSATE SODIUM 100 MG PO CAPS: 100.0000 mg | ORAL_CAPSULE | Freq: Two times a day (BID) | ORAL | 1 refills | Status: AC

## 2019-04-26 MED ORDER — OXYCODONE HCL 5 MG PO TABS: 5.0000 mg | ORAL_TABLET | ORAL | 0 refills | Status: DC | PRN

## 2019-04-26 MED ORDER — ONDANSETRON 4 MG PO TBDP: 4.0000 mg | ORAL_TABLET | Freq: Four times a day (QID) | ORAL | 0 refills | Status: DC | PRN

## 2019-04-26 NOTE — Discharge Summary (Signed)
Physician Discharge Summary  Patient ID: Kristin Bailey MRN: [id number] DOB/AGE: [id number] 57 y.o.  Admit date: 04/22/2019 Discharge date: 04/26/2019  Admission Diagnoses: Diverticulitis   Discharge Diagnoses:  Principal Problem:   Diverticulitis Active Problems:   S/P partial colectomy   Sigmoid diverticulitis   Discharged Condition: good  Hospital Course: Kristin Bailey is a 58 yo with diverticulitis that was admitted after a laparoscopic sigmoid colectomy. She did well post operatively. She has been tolerating her diet from clears to soft. She has had adequate pain control and is on oral pain medications. She has been ambulating well in the halls. She has had multiple Bms. She had a leukocytosis post op that has trended down, and feels good overall. She is ready for discharge home.   Consults: None  Significant Diagnostic Studies: None   Treatments: Laparoscopic sigmoid colectomy 04/22/2019  Discharge Exam: Blood pressure (!) 118/54, pulse 71, temperature 98 F (36.7 C), temperature source Oral, resp. rate 18, height 5\' 6"  (1.676 m), weight 108 kg, last menstrual period 03/27/2014, SpO2 97 %. General appearance: alert, cooperative and no distress Resp: normal work of breathing GI: soft, nondistended, appropriately tender, staples c/d/i with no erythema or drainage Extremities: extremities normal, atraumatic, no cyanosis or edema  Disposition: Discharge disposition: 01-Home or Self Care       Discharge Instructions    Call MD for:  difficulty breathing, headache or visual disturbances   Complete by: As directed    Call MD for:  extreme fatigue   Complete by: As directed    Call MD for:  persistant dizziness or light-headedness   Complete by: As directed    Call MD for:  persistant nausea and vomiting   Complete by: As directed    Call MD for:  redness, tenderness, or signs of infection (pain, swelling, redness, odor or green/yellow discharge around incision site)    Complete by: As directed    Call MD for:  severe uncontrolled pain   Complete by: As directed    Call MD for:  temperature >100.4   Complete by: As directed    Diet - low sodium heart healthy   Complete by: As directed    Increase activity slowly   Complete by: As directed      Allergies as of 04/26/2019   No Known Allergies     Medication List    TAKE these medications   albuterol 108 (90 Base) MCG/ACT inhaler Commonly known as: VENTOLIN HFA Inhale 2 puffs into the lungs every 4 (four) hours as needed for wheezing or shortness of breath.   ALPRAZolam 0.25 MG tablet Commonly known as: XANAX Take 1 tablet (0.25 mg total) by mouth 2 (two) times daily as needed for anxiety. What changed: how much to take   aspirin EC 81 MG tablet Take 81 mg by mouth daily.   Biotin 10000 MCG Tabs Take 10,000 mcg by mouth daily.   cholecalciferol 25 MCG (1000 UT) tablet Commonly known as: VITAMIN D3 Take 1,000 Units by mouth daily.   docusate sodium 100 MG capsule Commonly known as: COLACE Take 1 capsule (100 mg total) by mouth 2 (two) times daily. What changed:   when to take this  reasons to take this   DULoxetine 60 MG capsule Commonly known as: CYMBALTA Take 1 capsule (60 mg total) by mouth 2 (two) times daily.   Ester-C Tabs Take 1 tablet by mouth daily.   glipiZIDE 5 MG tablet Commonly known as: GLUCOTROL Take 5 mg  by mouth 2 (two) times daily before a meal.   glucosamine-chondroitin 500-400 MG tablet Take 1 tablet by mouth 2 (two) times a day.   levothyroxine 100 MCG tablet Commonly known as: SYNTHROID Take 100 mcg by mouth daily before breakfast.   lisinopril-hydrochlorothiazide 20-12.5 MG tablet Commonly known as: ZESTORETIC Take 1 tablet by mouth daily. Hold until follow up with PCP What changed: additional instructions   loratadine 10 MG tablet Commonly known as: CLARITIN Take 10 mg by mouth daily.   meclizine 25 MG tablet Commonly known as:  ANTIVERT Take 1 tablet (25 mg total) by mouth 3 (three) times daily as needed for dizziness.   multivitamin with minerals Tabs tablet Take 1 tablet by mouth daily.   OMEGA 3 500 PO Take 500 mg by mouth at bedtime.   omeprazole 20 MG tablet Commonly known as: PRILOSEC OTC Take 1 tablet (20 mg total) by mouth daily. What changed: when to take this   ondansetron 4 MG disintegrating tablet Commonly known as: ZOFRAN-ODT Take 1 tablet (4 mg total) by mouth every 6 (six) hours as needed for nausea. What changed:   medication strength  how much to take  when to take this  reasons to take this   oxyCODONE 5 MG immediate release tablet Commonly known as: Oxy IR/ROXICODONE Take 1-2 tablets (5-10 mg total) by mouth every 4 (four) hours as needed for moderate pain.   promethazine 25 MG tablet Commonly known as: PHENERGAN Take 1 tablet (25 mg total) by mouth every 6 (six) hours as needed for nausea or vomiting.   psyllium 0.52 g capsule Commonly known as: REGULOID Take 2 capsules (1.04 g total) by mouth daily as needed (constipation).   traZODone 150 MG tablet Commonly known as: DESYREL Take 1 tablet (150 mg total) by mouth at bedtime as needed for sleep. What changed: when to take this   Tylenol 8 Hour Arthritis Pain 650 MG CR tablet Generic drug: acetaminophen Take 1,300 mg by mouth See admin instructions. Take 2 tablets daily. Take 2 tablets once a day as needed for mild pain.   valACYclovir 1000 MG tablet Commonly known as: VALTREX Take 2,000 mg by mouth See admin instructions. As needed for fever blisters, take 2 tablets twice a day for two doses.      Follow-up Information    Virl Cagey, MD Follow up on 05/07/2019.   Specialty: General Surgery Why: staple removal; call to make the appointment. The office does not work on Friday.  Contact information: [address] [place] [address] [phone]           Signed: Virl Cagey 04/26/2019, 11:36 AM

## 2019-04-26 NOTE — Discharge Instructions (Signed)
Discharge Open Abdominal Surgery Instructions:  Common Complaints: Pain at the incision site is common. This will improve with time. Take your pain medications as described below. Some nausea is common and poor appetite. The main goal is to stay hydrated the first few days after surgery.   Diet/ Activity: Diet as tolerated. You have started and tolerated a diet in the hospital, and should continue to increase what you are able to eat.   You may not have a large appetite, but it is important to stay hydrated. Drink 64 ounces of water a day. Your appetite will return with time.  Keep a dry dressing in place over your staples daily or as needed. Some minor pink/ blood tinged drainage is expected. This will stop in a few days after surgery.  Shower per your regular routine daily.  Do not take hot showers. Take warm showers that are less than 10 minutes. Path the incision dry. Wear an abdominal binder daily with activity. You do not have to wear this while sleeping or sitting.  Rest and listen to your body, but do not remain in bed all day.  Walk everyday for at least 15-20 minutes. Deep cough and move around every 1-2 hours in the first few days after surgery.  Do not lift > 10 lbs, perform excessive bending, pushing, pulling, squatting for 6-8 weeks after surgery.  The activity restrictions and the abdominal binder are to prevent hernia formation at your incision while you are healing.  Do not place lotions or balms on your incision unless instructed to specifically by Dr. Constance Haw.   Medication: Take tylenol and ibuprofen as needed for pain control, alternating every 4-6 hours.  Example:  Tylenol 1000mg  @ 6am, 12noon, 6pm, 57midnight (Do not exceed 4000mg  of tylenol a day). Ibuprofen 800mg  @ 9am, 3pm, 9pm, 3am (Do not exceed 3600mg  of ibuprofen a day).  Take Roxicodone for breakthrough pain every 4 hours.  Take Colace for constipation related to narcotic pain medication. If you do not have a  bowel movement in 2 days, take Miralax over the counter.  Drink plenty of water to also prevent constipation.   Contact Information: If you have questions or concerns, please call our office, [phone], Monday- Thursday 8AM-5PM and Friday 8AM-12Noon.  If it is after hours or on the weekend, please call [facility], [phone], and ask to speak to the surgeon on call for Dr. Constance Haw at [facility].    Laparoscopic Colectomy, Care After This sheet gives you information about how to care for yourself after your procedure. Your health care provider may also give you more specific instructions. If you have problems or questions, contact your health care provider. What can I expect after the procedure? After your procedure, it is common to have the following:  Pain in your abdomen, especially in the incision areas. You will be given medicine to control the pain.  Tiredness. This is a normal part of the recovery process. Your energy level will return to normal over the next several weeks.  Changes in your bowel movements, such as constipation or needing to go more often. Talk with your health care provider about how to manage this. Follow these instructions at home: Medicines  Take over-the-counter and prescription medicines only as told by your health care provider.  Do not drive or use heavy machinery while taking prescription pain medicine.  Do not drink alcohol while taking prescription pain medicine.  If you were prescribed an antibiotic medicine, use it as  told by your health care provider. Do not stop using the antibiotic even if you start to feel better. Incision care   Follow instructions from your health care provider about how to take care of your incision areas. Make sure you: ? Keep your incisions clean and dry. ? Wash your hands with soap and water before and after applying medicine to the areas, and before and after changing your bandage (dressing). If soap and  water are not available, use hand sanitizer. ? Change your dressing as told by your health care provider. ? Leave stitches (sutures), skin glue, or adhesive strips in place. These skin closures may need to stay in place for 2 weeks or longer. If adhesive strip edges start to loosen and curl up, you may trim the loose edges. Do not remove adhesive strips completely unless your health care provider tells you to do that.  Do not wear tight clothing over the incisions. Tight clothing may rub and irritate the incision areas, which may cause the incisions to open.  Do not take baths, swim, or use a hot tub until your health care provider approves.   You may shower.  Check your incision area every day for signs of infection. Check for: ? More redness, swelling, or pain. ? More fluid or blood. ? Warmth. ? Pus or a bad smell. Activity  Avoid lifting anything that is heavier than 10 lb (4.5 kg) for 2 weeks or until your health care provider says it is okay.  You may resume normal activities as told by your health care provider. Ask your health care provider what activities are safe for you.  Take rest breaks during the day as needed. Eating and drinking  Follow instructions from your health care provider about what you can eat after surgery.  To prevent or treat constipation while you are taking prescription pain medicine, your health care provider may recommend that you: ? Drink enough fluid to keep your urine clear or pale yellow. ? Take over-the-counter or prescription medicines. ? Eat foods that are high in fiber, such as fresh fruits and vegetables, whole grains, and beans. ? Limit foods that are high in fat and processed sugars, such as fried and sweet foods. General instructions  Ask your health care provider when you will need an appointment to get your sutures or staples removed.  Keep all follow-up visits as told by your health care provider. This is important. Contact a health  care provider if:  You have more redness, swelling, or pain around your incisions.  You have more fluid or blood coming from the incisions.  Your incisions feel warm to the touch.  You have pus or a bad smell coming from your incisions or your dressing.  You have a fever.  You have an incision that breaks open (edges not staying together) after sutures or staples have been removed. Get help right away if:  You develop a rash.  You have chest pain or difficulty breathing.  You have pain or swelling in your legs.  You feel light-headed or you faint.  Your abdomen swells (becomes distended).  You have nausea or vomiting.  You have blood in your stool (feces). This information is not intended to replace advice given to you by your health care provider. Make sure you discuss any questions you have with your health care provider. Document Released: 05/20/2005 Document Revised: 07/20/2018 Document Reviewed: 08/01/2016 Elsevier Interactive Patient Education  2019 [employer].

## 2019-04-26 NOTE — Progress Notes (Signed)
Discharge instructions reviewed with patient. Patient verbalized understanding of instructions. Patient discharged home with family in stable condition   

## 2019-04-26 NOTE — Progress Notes (Signed)
Patient walked from room to nurses station and back. Patient tolerated well. Pt wore mask during walk.

## 2019-04-28 ENCOUNTER — Encounter: Payer: Self-pay | Admitting: General Surgery

## 2019-04-28 NOTE — Progress Notes (Signed)
Patient's daughter called earlier. Worried about incision. She sent photo to my come email. Minor redness at staples but not extending. Looks more like staple reaction. Minor drainage at belly button. Does not appear Infected. Clean with soap and water. Notify if wants to see me Tuesday in clinic.  Curlene Labrum, MD

## 2019-04-30 ENCOUNTER — Encounter: Payer: Self-pay | Admitting: General Surgery

## 2019-04-30 NOTE — Progress Notes (Signed)
Called patient to check on her. Doing well. No changes to the wound. No increased redness and drainage stopped. Having Bms and hemorrhoid flare. Sitz bath ok but don't get incisions wet. Try fiber and stop colace if bms too loose. Eating and no fevers. Wants to try salad. Ok to try told her might have a cramping or loose stools.   See next Tuesday or sooner if something changes.  Curlene Labrum, MD

## 2019-05-07 ENCOUNTER — Encounter: Payer: Self-pay | Admitting: General Surgery

## 2019-05-07 ENCOUNTER — Ambulatory Visit (INDEPENDENT_AMBULATORY_CARE_PROVIDER_SITE_OTHER): Payer: Self-pay | Admitting: General Surgery

## 2019-05-07 ENCOUNTER — Other Ambulatory Visit: Payer: Self-pay

## 2019-05-07 VITALS — BP 137/82 | HR 69 | Temp 97.7°F | Resp 18 | Ht 66.0 in | Wt 244.0 lb

## 2019-05-07 DIAGNOSIS — K5792 Diverticulitis of intestine, part unspecified, without perforation or abscess without bleeding: Secondary | ICD-10-CM

## 2019-05-07 NOTE — Patient Instructions (Addendum)
Can go back to work on 05/13/19 for 4 hours a day. Can go back to 5.5 hours a day on 05/20/19 and can resume full 7 hours a day on 05/27/19.  No heavy lifting > 10 lbs, or excessive pushing, pulling, bending, squatting until 06/17/2019.   Take fiber to keep your stools regular and soft daily. Can place neosporin or hydrocortisone on the staple line irritation as desired.  Start to do Kegel Exercises at home to help with urinary leakage.  You can [employer] for correct Kegel exercises.  Google  Kegels Exercises for Women - Complete BEGINNERS Guide Kristin Bailey has some good videos. Do not do any advanced exercises that involve squatting at this time.

## 2019-05-07 NOTE — Progress Notes (Signed)
[facility] Note   HPI:  58 y.o. Female presents to clinic for post-op follow-up evaluation of after a laparoscopic sigmoid colectomy. Patient reports she is doing well. Her staples have still had some redness just around them from skin irritation. This redness has not increased or spread. She has had some minor drainage at the umbilicus too she reports. She is eating but does not have a great appetite. She is staying hydrated. She is taking fiber now and is off colace.   Review of Systems:  No fever or chills Some soreness Urinary leakage All other review of systems: otherwise negative   Vital Signs:  BP 137/82 (BP Location: Left Arm, Patient Position: Sitting, Cuff Size: Normal)   Pulse 69   Temp 97.7 F (36.5 C) (Temporal)   Resp 18   Ht 5\' 6"  (1.676 m)   Wt 244 lb (110.7 kg)   LMP 03/27/2014 Comment: irregular  SpO2 97%   BMI 39.38 kg/m    Physical Exam:  Physical Exam Vitals signs reviewed.  Cardiovascular:     Rate and Rhythm: Normal rate.  Pulmonary:     Effort: Pulmonary effort is normal.  Abdominal:     General: There is no distension.     Palpations: Abdomen is soft.     Tenderness: There is abdominal tenderness.     Comments: Staples removed, redness at staples but not extending, no signs of infection, umbilicus with some minor fibrinous area, probed and does not track, no signs of seroma or collection noted   Musculoskeletal: Normal range of motion.  Skin:    General: Skin is warm.  Neurological:     General: No focal deficit present.     Mental Status: She is alert and oriented to person, place, and time.     Assessment:  58 y.o. yo Female s/p laparoscopic sigmoid colectomy. Doing well. She is ready to start back at work part time. She has not done much in the last few weeks and today is the first day out of the house.   She is having some urinary leakage and we had discussed this as a possibility preop.   Plan:  Can go back to work on  05/13/19 for 4 hours a day. Can go back to 5.5 hours a day on 05/20/19 and can resume full 7 hours a day on 05/27/19.  No heavy lifting > 10 lbs, or excessive pushing, pulling, bending, squatting until 06/17/2019.   Take fiber to keep your stools regular and soft daily. Can place neosporin or hydrocortisone on the staple line irritation as desired.  Start to do Kegel Exercises at home to help with urinary leakage.  You can [employer] for correct Kegel exercises.  Google  Kegels Exercises for Women - Complete BEGINNERS Guide Mauri Brooklyn has some good videos. Do not do any advanced exercises that involve squatting at this time.    All of the above recommendations were discussed with the patient, and all of patient's questions were answered to her expressed satisfaction.  Curlene Labrum, MD [facility] [address] [place], [place] [address] [phone] (office)   Future Appointments  Date Time Provider [place]  06/03/2019  9:00 AM Norman Clay, MD [facility] None  06/04/2019 10:30 AM Virl Cagey, MD [facility] None

## 2019-05-23 ENCOUNTER — Telehealth: Payer: Self-pay

## 2019-05-23 NOTE — Telephone Encounter (Signed)
Pt called stating that she was having pain around her belly button, states possible bladder infection. Patient was advised to go see her PCP.

## 2019-05-27 ENCOUNTER — Telehealth: Payer: Self-pay | Admitting: General Surgery

## 2019-05-27 NOTE — Telephone Encounter (Signed)
[facility]  Patient having some abdominal pain and nauseate and dizzy. Went to see PCP and no UTI. Will see on Thursday 7/16 @ 230pm.   Did not get to speak with patient, spoke with her husband who will notify patient of appt.  Curlene Labrum, MD [facility] [address] [place], [place] [address] [phone] (office)

## 2019-05-28 NOTE — Progress Notes (Signed)
Virtual Visit via Video Note  I connected with Kristin Bailey on 06/03/19 at  9:00 AM EDT by a video enabled telemedicine application and verified that I am speaking with the correct person using two identifiers.   I discussed the limitations of evaluation and management by telemedicine and the availability of in person appointments. The patient expressed understanding and agreed to proceed.     I discussed the assessment and treatment plan with the patient. The patient was provided an opportunity to ask questions and all were answered. The patient agreed with the plan and demonstrated an understanding of the instructions.   The patient was advised to call back or seek an in-person evaluation if the symptoms worsen or if the condition fails to improve as anticipated.  I provided 15 minutes of non-face-to-face time during this encounter.   Norman Clay, MD    [facility] MD/PA/NP OP Progress Note  06/03/2019 9:12 AM Kristin Bailey  MRN:  [id number]  Chief Complaint:  Chief Complaint    Depression; Follow-up     HPI:  - She underwent laparoscopic sigmoid colectomy, s/p diverticulitis This is a follow-up appointment for depression.  She states that she underwent colectomy, and she has been doing very well since then.  She decided to pursue her own choice, and she feels, that she could do it. She was anxious last night and took xanax. She reflected that she may have been anxious due to upcoming visit of her friends. She was thinking that she needs to clean the house. She enjoys meeting with her friends.  She has middle insomnia.  She denies feeling depressed.  She has good motivation and energy.  She has good concentration.  She denies SI. She feels anxious, tense at times. She has good appetite; she is on weight watcher, and lost 33 lbs. She feels good about this loss, although she thinks that she needs to lose more for health. She denies panic attacks. She feels dizzy at times; she makes sure  for hydration.    Visit Diagnosis:    ICD-10-CM   1. MDD (major depressive disorder), recurrent, in partial remission ([place])  F33.41     Past Psychiatric History: Please see initial evaluation for full details. I have reviewed the history. No updates at this time.     Past Medical History:  Past Medical History:  Diagnosis Date  . Anxiety   . Arthritis   . Coronary artery disease   . Depression   . Diabetes mellitus without complication ([place])   . Diverticulitis   . GERD (gastroesophageal reflux disease)   . Hypertension   . Hypothyroidism   . IBS (irritable bowel syndrome)   . Pneumonia   . Thyroid disease     Past Surgical History:  Procedure Laterality Date  . AGILE CAPSULE N/A 11/23/2016   Procedure: AGILE CAPSULE;  Surgeon: Rogene Houston, MD;  Location: [facility];  Service: Endoscopy;  Laterality: N/A;  8:30  . BREAST EXCISIONAL BIOPSY Right   . CARPAL TUNNEL RELEASE    . CESAREAN SECTION    . CHOLECYSTECTOMY    . COLONOSCOPY N/A 08/28/2014   Procedure: COLONOSCOPY;  Surgeon: Rogene Houston, MD;  Location: [facility];  Service: Endoscopy;  Laterality: N/A;  1030  . CYST REMOVAL HAND    . ESOPHAGOGASTRODUODENOSCOPY N/A 03/19/2016   Procedure: ESOPHAGOGASTRODUODENOSCOPY (EGD);  Surgeon: Rogene Houston, MD;  Location: [facility];  Service: Endoscopy;  Laterality: N/A;  . FLEXIBLE SIGMOIDOSCOPY  N/A 09/28/2017   Procedure: FLEXIBLE SIGMOIDOSCOPY;  Surgeon: Rogene Houston, MD;  Location: [facility];  Service: Endoscopy;  Laterality: N/A;  . FRACTURE SURGERY     rt ankle   . GIVENS CAPSULE STUDY N/A 12/15/2016   Procedure: GIVENS CAPSULE STUDY;  Surgeon: Rogene Houston, MD;  Location: [facility];  Service: Endoscopy;  Laterality: N/A;  . KNEE SURGERY     meniscus repair  . LAPAROSCOPIC PARTIAL COLECTOMY N/A 04/22/2019   Procedure: LAPAROSCOPIC PARTIAL SIGMOID COLECTOMY;  Surgeon: Virl Cagey, MD;  Location: [facility];  Service: General;  Laterality:  N/A;  . TUBAL LIGATION      Family Psychiatric History: Please see initial evaluation for full details. I have reviewed the history. No updates at this time.     Family History:  Family History  Problem Relation Age of Onset  . Cancer Mother        colon cancer age74  . Hypertension Mother   . Stroke Mother   . Diabetes Mother   . Non-Hodgkin's lymphoma Father   . Hypertension Father   . Diabetes Father   . Stroke Maternal Grandmother   . Kidney disease Paternal Aunt   . Cancer Paternal Aunt   . Kidney disease Paternal Uncle   . Cancer Paternal Uncle     Social History:  Social History   Socioeconomic History  . Marital status: Married    Spouse name: Not on file  . Number of children: Not on file  . Years of education: Not on file  . Highest education level: Not on file  Occupational History  . Not on file  Social Needs  . Financial resource strain: Not hard at all  . Food insecurity    Worry: Never true    Inability: Never true  . Transportation needs    Medical: No    Non-medical: No  Tobacco Use  . Smoking status: Never Smoker  . Smokeless tobacco: Never Used  Substance and Sexual Activity  . Alcohol use: No  . Drug use: No  . Sexual activity: Yes    Birth control/protection: Surgical  Lifestyle  . Physical activity    Days per week: 0 days    Minutes per session: 0 min  . Stress: To some extent  Relationships  . Social connections    Talks on phone: More than three times a week    Gets together: Once a week    Attends religious service: 1 to 4 times per year    Active member of club or organization: No    Attends meetings of clubs or organizations: Never    Relationship status: Married  Other Topics Concern  . Not on file  Social History Narrative   Works with special needs adults.    Allergies: No Known Allergies  Metabolic Disorder Labs: Lab Results  Component Value Date   HGBA1C 6.2 (H) 04/18/2019   MPG 131.24 04/18/2019   MPG  134.11 03/02/2019   No results found for: PROLACTIN No results found for: CHOL, TRIG, HDL, CHOLHDL, VLDL, LDLCALC Lab Results  Component Value Date   TSH 1.49 02/21/2018   TSH 5.142 (H) 09/27/2017    Therapeutic Level Labs: No results found for: LITHIUM No results found for: VALPROATE No components found for:  CBMZ  Current Medications: Current Outpatient Medications  Medication Sig Dispense Refill  . acetaminophen (TYLENOL 8 HOUR ARTHRITIS PAIN) 650 MG CR tablet Take 1,300 mg by mouth See admin instructions.  Take 2 tablets daily. Take 2 tablets once a day as needed for mild pain.    Marland Kitchen albuterol (PROVENTIL HFA;VENTOLIN HFA) 108 (90 BASE) MCG/ACT inhaler Inhale 2 puffs into the lungs every 4 (four) hours as needed for wheezing or shortness of breath.    . ALPRAZolam (XANAX) 0.25 MG tablet Take 1 tablet (0.25 mg total) by mouth 2 (two) times daily as needed for anxiety. 60 tablet 2  . aspirin EC 81 MG tablet Take 81 mg by mouth daily.    Marland Kitchen Bioflavonoid Products (ESTER-C) TABS Take 1 tablet by mouth daily.    . Biotin 10000 MCG TABS Take 10,000 mcg by mouth daily.    . cholecalciferol (VITAMIN D3) 25 MCG (1000 UT) tablet Take 1,000 Units by mouth daily.    Marland Kitchen docusate sodium (COLACE) 100 MG capsule Take 1 capsule (100 mg total) by mouth 2 (two) times daily. 60 capsule 1  . DULoxetine (CYMBALTA) 60 MG capsule Take 1 capsule (60 mg total) by mouth 2 (two) times daily. 180 capsule 0  . glipiZIDE (GLUCOTROL) 5 MG tablet Take 5 mg by mouth 2 (two) times daily before a meal.     . glucosamine-chondroitin 500-400 MG tablet Take 1 tablet by mouth 2 (two) times a day.    . levothyroxine (SYNTHROID, LEVOTHROID) 100 MCG tablet Take 100 mcg by mouth daily before breakfast.   1  . lisinopril-hydrochlorothiazide (ZESTORETIC) 20-12.5 MG tablet Take 1 tablet by mouth daily. Hold until follow up with PCP (Patient taking differently: Take 0.5 tablets by mouth daily. )    . loratadine (CLARITIN) 10 MG tablet  Take 10 mg by mouth daily.    . meclizine (ANTIVERT) 25 MG tablet Take 1 tablet (25 mg total) by mouth 3 (three) times daily as needed for dizziness. 30 tablet 0  . Multiple Vitamin (MULTIVITAMIN WITH MINERALS) TABS tablet Take 1 tablet by mouth daily.    . Omega-3 Fatty Acids (OMEGA 3 500 PO) Take 500 mg by mouth at bedtime.    Marland Kitchen omeprazole (PRILOSEC OTC) 20 MG tablet Take 1 tablet (20 mg total) by mouth daily. (Patient taking differently: Take 20 mg by mouth at bedtime. ) 20 tablet 5  . ondansetron (ZOFRAN-ODT) 4 MG disintegrating tablet Take 1 tablet (4 mg total) by mouth every 6 (six) hours as needed for nausea. 20 tablet 0  . oxyCODONE (OXY IR/ROXICODONE) 5 MG immediate release tablet Take 1-2 tablets (5-10 mg total) by mouth every 4 (four) hours as needed for moderate pain. 30 tablet 0  . promethazine (PHENERGAN) 25 MG tablet Take 1 tablet (25 mg total) by mouth every 6 (six) hours as needed for nausea or vomiting. 30 tablet 0  . psyllium (REGULOID) 0.52 g capsule Take 2 capsules (1.04 g total) by mouth daily as needed (constipation).    Derrill Memo ON 06/22/2019] traZODone (DESYREL) 150 MG tablet 150-225 mg at night 135 tablet 0  . valACYclovir (VALTREX) 1000 MG tablet Take 2,000 mg by mouth See admin instructions. As needed for fever blisters, take 2 tablets twice a day for two doses.     No current facility-administered medications for this visit.      Musculoskeletal: Strength & Muscle Tone: N/A Gait & Station: N/A Patient leans: N/A  Psychiatric Specialty Exam: Review of Systems  Psychiatric/Behavioral: Negative for depression, hallucinations, memory loss, substance abuse and suicidal ideas. The patient is nervous/anxious and has insomnia.   All other systems reviewed and are negative.   Last menstrual period  03/27/2014.There is no height or weight on file to calculate BMI.  General Appearance: Fairly Groomed  Eye Contact:  Good  Speech:  Clear and Coherent  Volume:  Normal   Mood:  "good"  Affect:  Appropriate, Congruent and Full Range  Thought Process:  Coherent  Orientation:  Full (Time, Place, and Person)  Thought Content: Logical   Suicidal Thoughts:  No  Homicidal Thoughts:  No  Memory:  Immediate;   Good  Judgement:  Good  Insight:  Fair  Psychomotor Activity:  Normal  Concentration:  Concentration: Good and Attention Span: Good  Recall:  Good  Fund of Knowledge: Good  Language: Good  Akathisia:  No  Handed:  Right  AIMS (if indicated): not done  Assets:  Communication Skills Desire for Improvement  ADL's:  Intact  Cognition: WNL  Sleep:  Poor   Screenings:   Assessment and Plan:  Kristin Bailey is a 58 y.o. year old female with a history of depression, hypothyroidism, hypertension, IBS,diabetes, sciatica , who presents for follow up appointment for depression.   # MDD, recurrent in partial remission There has been steady improvement in her mood symptoms except occasional anxiety since her last visit.  Will continue duloxetine to target depression. Will consider tapering down this medication at the next visit if she continues to have minimal mood symptoms. Will continue trazodone as needed for insomnia.  Discussed risk of dizziness and over sedation. Will continue Xanax as needed for anxiety.  Discussed behavioral activation.    Plan I have reviewed and updated plans as below 1. Continueduloxetine 60 mg twice a day- [facility], has six months refill according to the patient 2.Continuetrazodone 150- 225mg  at night as needed for sleep 3. Continue Xanax0.25 mg up to twice a day as needed for anxiety (she takes two tabs per week or less)  Next appointment 10/27 at 9 AM for 20 mins, video  Past trials of medication: fluoxetine, Effexor, duloxetine,buspar (unable to tolerate),Xanax,  The patient demonstrates the following risk factors for suicide: Chronic risk factors for suicide include: psychiatric disorder of depressionand  chronic pain. Acute risk factorsfor suicide include: family or marital conflict. Protective factorsfor this patient include: responsibility to others (children, family), coping skills and hope for the future. Considering these factors, the overall suicide risk at this point appears to be low. Patient isappropriate for outpatient follow up.  Norman Clay, MD 06/03/2019, 9:12 AM

## 2019-05-30 ENCOUNTER — Encounter: Payer: Self-pay | Admitting: General Surgery

## 2019-05-30 ENCOUNTER — Ambulatory Visit (INDEPENDENT_AMBULATORY_CARE_PROVIDER_SITE_OTHER): Payer: Self-pay | Admitting: General Surgery

## 2019-05-30 ENCOUNTER — Other Ambulatory Visit: Payer: Self-pay

## 2019-05-30 VITALS — BP 127/69 | HR 78 | Temp 96.9°F | Resp 16 | Ht 66.0 in | Wt 242.0 lb

## 2019-05-30 DIAGNOSIS — K5792 Diverticulitis of intestine, part unspecified, without perforation or abscess without bleeding: Secondary | ICD-10-CM

## 2019-05-30 NOTE — Patient Instructions (Signed)
No lifting > 10 lbs or excessive bending, pushing, pulling until the first week of August. Diet as tolerated. Drink 8 - 8 ounces of water a day. Take colace twice a day. Miralax if no BM or hard BM.  I will contact Dr. Laural Golden regarding colonoscopy schedule and constipation.  Self referral to Uro-gyncology for incontinence. If they need a referral, please let us know.

## 2019-05-30 NOTE — Progress Notes (Signed)
[facility] Note   HPI:  58 y.o. Female presents to clinic for follow-up evaluation of after her laparoscopic sigmoid colectomy for diverticulitis. She has been having more urinary incontinence and frequency and was worried about a UTI. This was checked and was negative by Dr. Juel Burrow office. We had discussed preop that her incontinence could get worse due to the surgery and that she may ultimately need to be seen by a urogynocologist. She has says she has been constipated and taking colace once a day and drinking maybe 6 glasses of water a day. She says that she has large hard stools and sometimes has some bleeding. She had her last colonoscopy fall 2018 and our pathology was negative.   Review of Systems:  No fever or chills Random lower abdominal pain Improving nausea Urinary incontinence even with scheduled voids  All other review of systems: otherwise negative   Vital Signs:  BP 127/69 (BP Location: Left Arm, Patient Position: Sitting, Cuff Size: Normal)   Pulse 78   Temp (!) 96.9 F (36.1 C) (Tympanic)   Resp 16   Ht 5\' 6"  (1.676 m)   Wt 242 lb (109.8 kg)   LMP 03/27/2014 Comment: irregular  SpO2 97%   BMI 39.06 kg/m    Physical Exam:  Physical Exam HENT:     Head: Normocephalic.  Pulmonary:     Effort: Pulmonary effort is normal.  Abdominal:     General: There is no distension.     Palpations: Abdomen is soft.     Tenderness: There is no abdominal tenderness.     Hernia: No hernia is present.     Comments: Healing midline wound and port sites, no erythema or drainage  Musculoskeletal:        General: No swelling.  Neurological:     Mental Status: She is alert.      Assessment:  59 y.o. yo Female s/p laparoscopic sigmoid colectomy for diverticulitis. She is doing well overall and is feeling better but still having incontinence especially at night. She is back to work full time but is following her precautions.   Plan:  No lifting > 10 lbs or  excessive bending, pushing, pulling until the first week of August. Diet as tolerated. Drink 8 - 8 ounces of water a day. Take colace twice a day. Miralax if no BM or hard BM.  I will contact Dr. Laural Golden regarding colonoscopy schedule and constipation.  Self referral to Uro-gyncology for incontinence. If they need a referral, please let us know.  Follow up PRN   All of the above recommendations were discussed with the patient, and all of patient's questions were answered to her expressed satisfaction.  Curlene Labrum, MD [facility] [address] [place],  [id number] [phone] (office)

## 2019-06-03 ENCOUNTER — Encounter (HOSPITAL_COMMUNITY): Payer: Self-pay | Admitting: Psychiatry

## 2019-06-03 ENCOUNTER — Ambulatory Visit (INDEPENDENT_AMBULATORY_CARE_PROVIDER_SITE_OTHER): Payer: 59 | Admitting: Psychiatry

## 2019-06-03 ENCOUNTER — Other Ambulatory Visit: Payer: Self-pay

## 2019-06-03 DIAGNOSIS — F3341 Major depressive disorder, recurrent, in partial remission: Secondary | ICD-10-CM

## 2019-06-03 MED ORDER — ALPRAZOLAM 0.25 MG PO TABS: 0.2500 mg | ORAL_TABLET | Freq: Two times a day (BID) | ORAL | 2 refills | Status: DC | PRN

## 2019-06-03 MED ORDER — TRAZODONE HCL 150 MG PO TABS: ORAL_TABLET | ORAL | 0 refills | Status: DC

## 2019-06-04 ENCOUNTER — Other Ambulatory Visit (HOSPITAL_COMMUNITY): Payer: Self-pay | Admitting: Internal Medicine

## 2019-06-04 ENCOUNTER — Ambulatory Visit: Payer: 59 | Admitting: General Surgery

## 2019-06-04 DIAGNOSIS — Z1231 Encounter for screening mammogram for malignant neoplasm of breast: Secondary | ICD-10-CM

## 2019-06-05 ENCOUNTER — Ambulatory Visit (HOSPITAL_COMMUNITY)
Admission: RE | Admit: 2019-06-05 | Discharge: 2019-06-05 | Disposition: A | Discharge status: Home / Self Care | Payer: 59 | Source: Ambulatory Visit | Attending: Internal Medicine | Admitting: Internal Medicine

## 2019-06-05 ENCOUNTER — Other Ambulatory Visit: Payer: Self-pay

## 2019-06-05 DIAGNOSIS — Z1231 Encounter for screening mammogram for malignant neoplasm of breast: Secondary | ICD-10-CM | POA: Insufficient documentation

## 2019-06-10 ENCOUNTER — Other Ambulatory Visit (HOSPITAL_COMMUNITY): Payer: Self-pay | Admitting: Adult Health Nurse Practitioner

## 2019-06-10 DIAGNOSIS — N644 Mastodynia: Secondary | ICD-10-CM

## 2019-06-18 ENCOUNTER — Ambulatory Visit (HOSPITAL_COMMUNITY): Payer: 59

## 2019-06-18 ENCOUNTER — Ambulatory Visit (HOSPITAL_COMMUNITY)
Admission: RE | Admit: 2019-06-18 | Discharge: 2019-06-18 | Disposition: A | Discharge status: Home / Self Care | Payer: 59 | Source: Ambulatory Visit | Attending: Adult Health Nurse Practitioner | Admitting: Adult Health Nurse Practitioner

## 2019-06-18 ENCOUNTER — Other Ambulatory Visit: Payer: Self-pay

## 2019-06-18 DIAGNOSIS — N644 Mastodynia: Secondary | ICD-10-CM | POA: Diagnosis present

## 2019-09-03 NOTE — Progress Notes (Signed)
Virtual Visit via Video Note  I connected with Kristin Bailey on 09/10/19 at  9:00 AM EDT by a video enabled telemedicine application and verified that I am speaking with the correct person using two identifiers.   I discussed the limitations of evaluation and management by telemedicine and the availability of in person appointments. The patient expressed understanding and agreed to proceed.     I discussed the assessment and treatment plan with the patient. The patient was provided an opportunity to ask questions and all were answered. The patient agreed with the plan and demonstrated an understanding of the instructions.   The patient was advised to call back or seek an in-person evaluation if the symptoms worsen or if the condition fails to improve as anticipated.  I provided 15 minutes of non-face-to-face time during this encounter.   Norman Clay, MD    [facility] MD/PA/NP OP Progress Note  09/10/2019 9:22 AM [name]  MRN:  [id number]  Chief Complaint:  Chief Complaint    Depression; Follow-up     HPI:  This is a follow-up appointment for depression.  She states that she has been doing very well.  She states that she was laid off from work in August.  She used to work for home health, meeting with adults with special needs.  The client wanted somebody else, and she lost her clients.  She has mixed feeling about this. Although she enjoys being at home, she is concerned of financial strain as her husband does not make lot of money. However, she does not feel depressed, stating that she used to feel depressed if she were to be in a similar situation in the past.  She has been doing well since colectomy, except that she had a mild abdominal pain last night.  She has occasional initial insomnia.  She denies feeling depressed.  She has good motivation and energy.  She enjoys cooking, mowing yards and trimming glasses. She has good concentration.  She has good appetite.  She denies SI.   She feels anxious when she has low blood sugar. She eats food and takes xanax as it helps her when it happens. She denies panic attacks. She denies irritability. She finds current medication regimen to be very helpful, and feels a little scared of any adjustment at this time.   Visit Diagnosis:    ICD-10-CM   1. MDD (major depressive disorder), recurrent, in full remission ([place])  F33.42     Past Psychiatric History: Please see initial evaluation for full details. I have reviewed the history. No updates at this time.     Past Medical History:  Past Medical History:  Diagnosis Date  . Anxiety   . Arthritis   . Coronary artery disease   . Depression   . Diabetes mellitus without complication ([place])   . Diverticulitis   . GERD (gastroesophageal reflux disease)   . Hypertension   . Hypothyroidism   . IBS (irritable bowel syndrome)   . Pneumonia   . Thyroid disease     Past Surgical History:  Procedure Laterality Date  . AGILE CAPSULE N/A 11/23/2016   Procedure: AGILE CAPSULE;  Surgeon: Rogene Houston, MD;  Location: [facility];  Service: Endoscopy;  Laterality: N/A;  8:30  . BREAST EXCISIONAL BIOPSY Right   . CARPAL TUNNEL RELEASE    . CESAREAN SECTION    . CHOLECYSTECTOMY    . COLONOSCOPY N/A 08/28/2014   Procedure: COLONOSCOPY;  Surgeon: Rogene Houston,  MD;  Location: [facility];  Service: Endoscopy;  Laterality: N/A;  1030  . CYST REMOVAL HAND    . ESOPHAGOGASTRODUODENOSCOPY N/A 03/19/2016   Procedure: ESOPHAGOGASTRODUODENOSCOPY (EGD);  Surgeon: Rogene Houston, MD;  Location: [facility];  Service: Endoscopy;  Laterality: N/A;  . FLEXIBLE SIGMOIDOSCOPY N/A 09/28/2017   Procedure: FLEXIBLE SIGMOIDOSCOPY;  Surgeon: Rogene Houston, MD;  Location: [facility];  Service: Endoscopy;  Laterality: N/A;  . FRACTURE SURGERY     rt ankle   . GIVENS CAPSULE STUDY N/A 12/15/2016   Procedure: GIVENS CAPSULE STUDY;  Surgeon: Rogene Houston, MD;  Location: [facility];   Service: Endoscopy;  Laterality: N/A;  . KNEE SURGERY     meniscus repair  . LAPAROSCOPIC PARTIAL COLECTOMY N/A 04/22/2019   Procedure: LAPAROSCOPIC PARTIAL SIGMOID COLECTOMY;  Surgeon: Virl Cagey, MD;  Location: [facility];  Service: General;  Laterality: N/A;  . TUBAL LIGATION      Family Psychiatric History: Please see initial evaluation for full details. I have reviewed the history. No updates at this time.     Family History:  Family History  Problem Relation Age of Onset  . Cancer Mother        colon cancer age74  . Hypertension Mother   . Stroke Mother   . Diabetes Mother   . Non-Hodgkin's lymphoma Father   . Hypertension Father   . Diabetes Father   . Stroke Maternal Grandmother   . Kidney disease Paternal Aunt   . Cancer Paternal Aunt   . Kidney disease Paternal Uncle   . Cancer Paternal Uncle     Social History:  Social History   Socioeconomic History  . Marital status: Married    Spouse name: Not on file  . Number of children: Not on file  . Years of education: Not on file  . Highest education level: Not on file  Occupational History  . Not on file  Social Needs  . Financial resource strain: Not hard at all  . Food insecurity    Worry: Never true    Inability: Never true  . Transportation needs    Medical: No    Non-medical: No  Tobacco Use  . Smoking status: Never Smoker  . Smokeless tobacco: Never Used  Substance and Sexual Activity  . Alcohol use: No  . Drug use: No  . Sexual activity: Yes    Birth control/protection: Surgical  Lifestyle  . Physical activity    Days per week: 0 days    Minutes per session: 0 min  . Stress: To some extent  Relationships  . Social connections    Talks on phone: More than three times a week    Gets together: Once a week    Attends religious service: 1 to 4 times per year    Active member of club or organization: No    Attends meetings of clubs or organizations: Never    Relationship status: Married   Other Topics Concern  . Not on file  Social History Narrative   Works with special needs adults.    Allergies: No Known Allergies  Metabolic Disorder Labs: Lab Results  Component Value Date   HGBA1C 6.2 (H) 04/18/2019   MPG 131.24 04/18/2019   MPG 134.11 03/02/2019   No results found for: PROLACTIN No results found for: CHOL, TRIG, HDL, CHOLHDL, VLDL, LDLCALC Lab Results  Component Value Date   TSH 1.49 02/21/2018   TSH 5.142 (H) 09/27/2017  Therapeutic Level Labs: No results found for: LITHIUM No results found for: VALPROATE No components found for:  CBMZ  Current Medications: Current Outpatient Medications  Medication Sig Dispense Refill  . acetaminophen (TYLENOL 8 HOUR ARTHRITIS PAIN) 650 MG CR tablet Take 1,300 mg by mouth See admin instructions. Take 2 tablets daily. Take 2 tablets once a day as needed for mild pain.    Marland Kitchen albuterol (PROVENTIL HFA;VENTOLIN HFA) 108 (90 BASE) MCG/ACT inhaler Inhale 2 puffs into the lungs every 4 (four) hours as needed for wheezing or shortness of breath.    . ALPRAZolam (XANAX) 0.25 MG tablet Take 1 tablet (0.25 mg total) by mouth 2 (two) times daily as needed for anxiety. 60 tablet 2  . aspirin EC 81 MG tablet Take 81 mg by mouth daily.    Marland Kitchen Bioflavonoid Products (ESTER-C) TABS Take 1 tablet by mouth daily.    . Biotin 10000 MCG TABS Take 10,000 mcg by mouth daily.    . cholecalciferol (VITAMIN D3) 25 MCG (1000 UT) tablet Take 1,000 Units by mouth daily.    Marland Kitchen docusate sodium (COLACE) 100 MG capsule Take 1 capsule (100 mg total) by mouth 2 (two) times daily. 60 capsule 1  . DULoxetine (CYMBALTA) 60 MG capsule Take 1 capsule (60 mg total) by mouth 2 (two) times daily. 180 capsule 0  . glipiZIDE (GLUCOTROL) 5 MG tablet Take 5 mg by mouth 2 (two) times daily before a meal.     . glucosamine-chondroitin 500-400 MG tablet Take 1 tablet by mouth 2 (two) times a day.    . levothyroxine (SYNTHROID, LEVOTHROID) 100 MCG tablet Take 100 mcg by  mouth daily before breakfast.   1  . lisinopril-hydrochlorothiazide (ZESTORETIC) 20-12.5 MG tablet Take 1 tablet by mouth daily. Hold until follow up with PCP (Patient taking differently: Take 0.5 tablets by mouth daily. )    . loratadine (CLARITIN) 10 MG tablet Take 10 mg by mouth daily.    . meclizine (ANTIVERT) 25 MG tablet Take 1 tablet (25 mg total) by mouth 3 (three) times daily as needed for dizziness. 30 tablet 0  . Multiple Vitamin (MULTIVITAMIN WITH MINERALS) TABS tablet Take 1 tablet by mouth daily.    . Omega-3 Fatty Acids (OMEGA 3 500 PO) Take 500 mg by mouth at bedtime.    Marland Kitchen omeprazole (PRILOSEC OTC) 20 MG tablet Take 1 tablet (20 mg total) by mouth daily. (Patient taking differently: Take 20 mg by mouth at bedtime. ) 20 tablet 5  . ondansetron (ZOFRAN-ODT) 4 MG disintegrating tablet Take 1 tablet (4 mg total) by mouth every 6 (six) hours as needed for nausea. 20 tablet 0  . oxyCODONE (OXY IR/ROXICODONE) 5 MG immediate release tablet Take 1-2 tablets (5-10 mg total) by mouth every 4 (four) hours as needed for moderate pain. 30 tablet 0  . promethazine (PHENERGAN) 25 MG tablet Take 1 tablet (25 mg total) by mouth every 6 (six) hours as needed for nausea or vomiting. 30 tablet 0  . psyllium (REGULOID) 0.52 g capsule Take 2 capsules (1.04 g total) by mouth daily as needed (constipation).    Derrill Memo ON 09/22/2019] traZODone (DESYREL) 150 MG tablet 150-225 mg at night 135 tablet 1  . valACYclovir (VALTREX) 1000 MG tablet Take 2,000 mg by mouth See admin instructions. As needed for fever blisters, take 2 tablets twice a day for two doses.     No current facility-administered medications for this visit.      Musculoskeletal: Strength & Muscle  Tone: N/A Gait & Station: N/A Patient leans: N/A  Psychiatric Specialty Exam: Review of Systems  Psychiatric/Behavioral: Negative for depression, hallucinations, memory loss, substance abuse and suicidal ideas. The patient is nervous/anxious and  has insomnia.   All other systems reviewed and are negative.   Last menstrual period 03/27/2014.There is no height or weight on file to calculate BMI.  General Appearance: Fairly Groomed  Eye Contact:  Good  Speech:  Clear and Coherent  Volume:  Normal  Mood:  "good"  Affect:  Appropriate, Congruent and euthymic  Thought Process:  Coherent  Orientation:  Full (Time, Place, and Person)  Thought Content: Logical   Suicidal Thoughts:  No  Homicidal Thoughts:  No  Memory:  Immediate;   Good  Judgement:  Good  Insight:  Good  Psychomotor Activity:  Normal  Concentration:  Concentration: Good and Attention Span: Good  Recall:  Good  Fund of Knowledge: Good  Language: Good  Akathisia:  No  Handed:  Right  AIMS (if indicated): not done  Assets:  Communication Skills Desire for Improvement  ADL's:  Intact  Cognition: WNL  Sleep:  Fair   Screenings:   Assessment and Plan:  Kristin Bailey is a 58 y.o. year old female with a history of depression,hypothyroidism, hypertension, IBS,diabetes, sciatica , who presents for follow up appointment for MDD (major depressive disorder), recurrent, in full remission ([place])  # MDD, recurrent in full remission She denies significant mood symptoms except occasional anxiety in related to hypoglycemia, despite recent stress of being laid off from work.  Discussed with the patient regarding the option of tapering down duloxetine given she has been treated for more than several months with good benefit from this medication.  She prefers to stay on the current dose; will continue duloxetine to target depression.  We will continue trazodone as needed for insomnia.  We will continue Xanax as needed for anxiety.  Discussed risk of dependence and oversedation.  Discussed behavioral activation.   Plan I have reviewed and updated plans as below 1. Continueduloxetine 60 mg twice a day- [facility]. Patient will call if she nees any  refill 2.Continuetrazodone150- 225mg  at night as needed for sleep 3.Continue Xanax0.25 mg up to twice a day as needed for anxiety (she takes two tabs per weekor less)   -  Two refills left since July 4. Next appointment in February  Past trials of medication: fluoxetine, Effexor, duloxetine,buspar (unable to tolerate),Xanax,  The patient demonstrates the following risk factors for suicide: Chronic risk factors for suicide include: psychiatric disorder of depressionand chronic pain. Acute risk factorsfor suicide include: family or marital conflict. Protective factorsfor this patient include: responsibility to others (children, family), coping skills and hope for the future. Considering these factors, the overall suicide risk at this point appears to be low. Patient isappropriate for outpatient follow up.  Norman Clay, MD 09/10/2019, 9:22 AM

## 2019-09-10 ENCOUNTER — Other Ambulatory Visit: Payer: Self-pay

## 2019-09-10 ENCOUNTER — Encounter (HOSPITAL_COMMUNITY): Payer: Self-pay | Admitting: Psychiatry

## 2019-09-10 ENCOUNTER — Ambulatory Visit (INDEPENDENT_AMBULATORY_CARE_PROVIDER_SITE_OTHER): Payer: Self-pay | Admitting: Psychiatry

## 2019-09-10 DIAGNOSIS — F3342 Major depressive disorder, recurrent, in full remission: Secondary | ICD-10-CM

## 2019-09-10 MED ORDER — TRAZODONE HCL 150 MG PO TABS: ORAL_TABLET | ORAL | 1 refills | Status: DC

## 2019-09-10 NOTE — Patient Instructions (Signed)
1. Continueduloxetine 60 mg twice a day 2.Continuetrazodone150- 225mg  at night as needed for sleep 3.Continue Xanax0.25 mg up to twice a day as needed for anxiety  4. Next appointment in February

## 2019-09-27 ENCOUNTER — Other Ambulatory Visit: Payer: Self-pay | Admitting: General Surgery

## 2019-12-17 ENCOUNTER — Ambulatory Visit: Payer: Self-pay | Admitting: Psychiatry

## 2019-12-17 ENCOUNTER — Other Ambulatory Visit: Payer: Self-pay

## 2019-12-18 IMAGING — CT CT ABD-PELV W/ CM
2 of 5 series · 16 of 46 positions shown, 18 images · IV contrast (Isovue)
Comparison: CT abdomen pelvis dated September 26, 2017.

CLINICAL DATA: Body aches and weakness.  Nausea.

EXAM:
CT ABDOMEN AND PELVIS WITH CONTRAST
TECHNIQUE: Multidetector CT imaging of the abdomen and pelvis was performed
using the standard protocol following bolus administration of
intravenous contrast.
CONTRAST:  100mL 9FY5D1-GSS IOPAMIDOL (9FY5D1-GSS) INJECTION 61%

[Series 2: axial st · axial · 0.80mm/px · z∈[+891,+1291]mm · 13 of 91 slices shown, 15 images]
[im 6/91  soft-tissue]
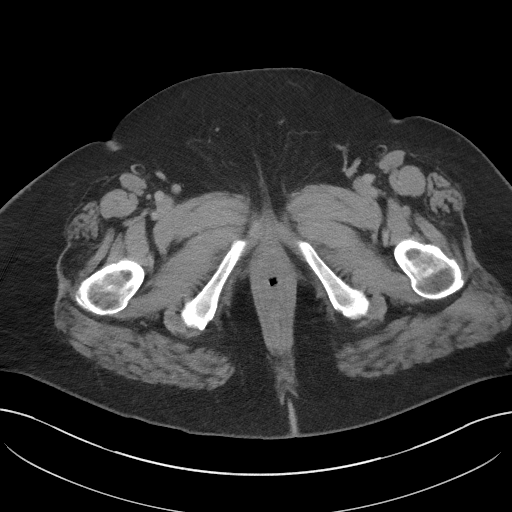
[im 6/91  bone]
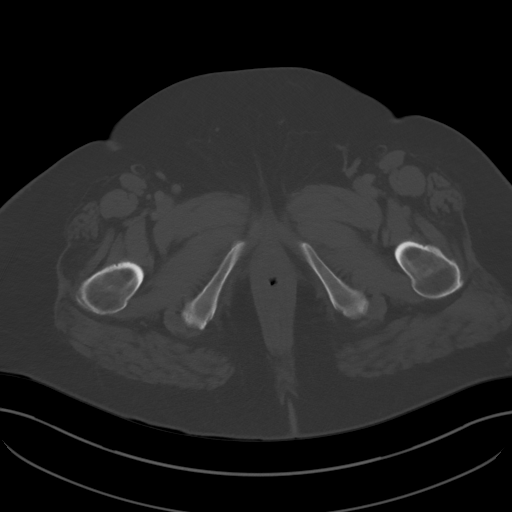
[im 11/91  soft-tissue]
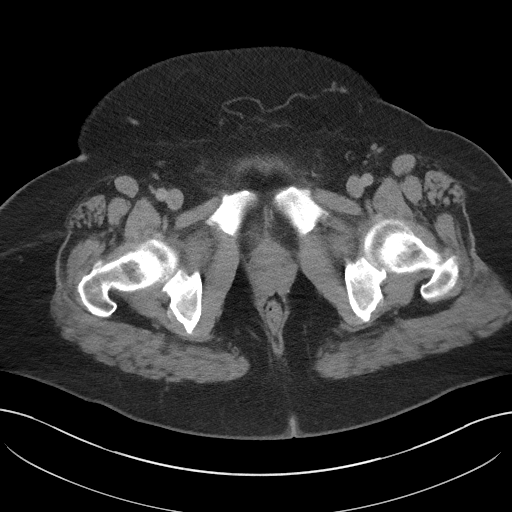
[im 21/91  soft-tissue]
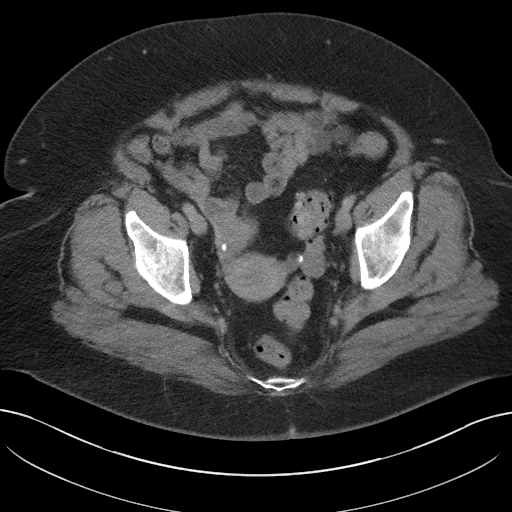
[im 26/91  soft-tissue]
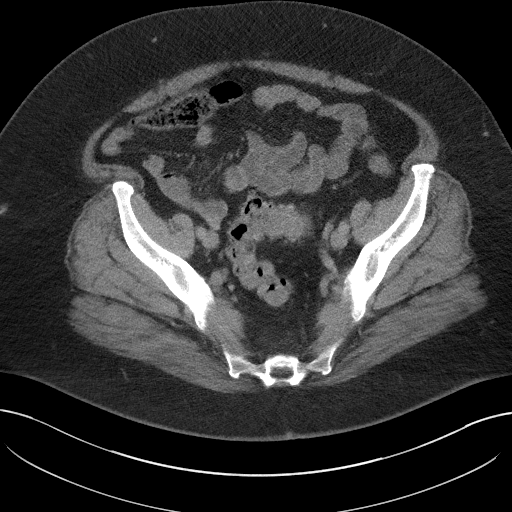
[im 31/91  soft-tissue]
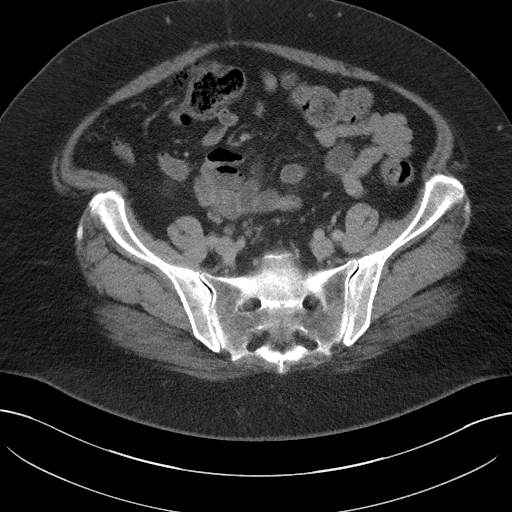
[im 41/91  soft-tissue]
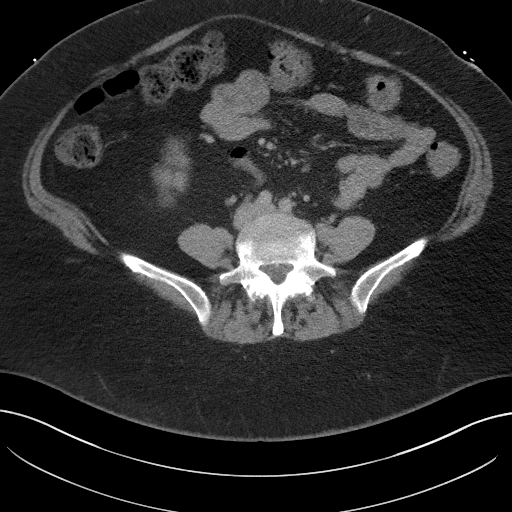
[im 46/91  soft-tissue]
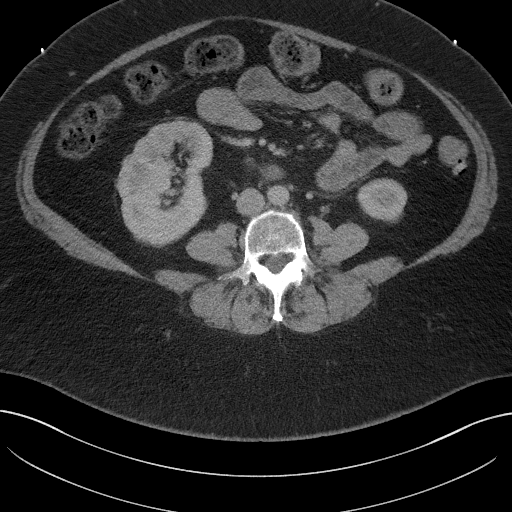
[im 51/91  soft-tissue]
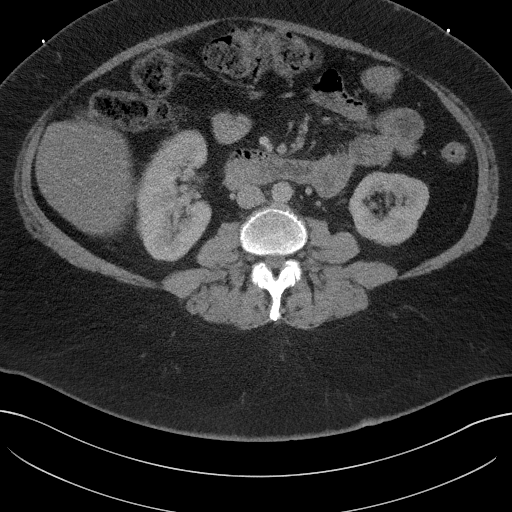
[im 61/91  soft-tissue]
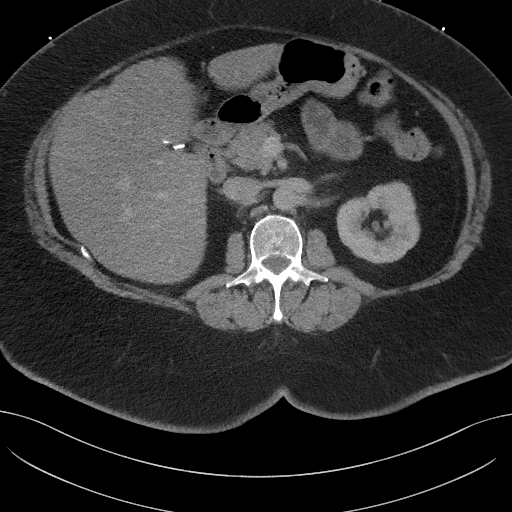
[im 61/91  bone]
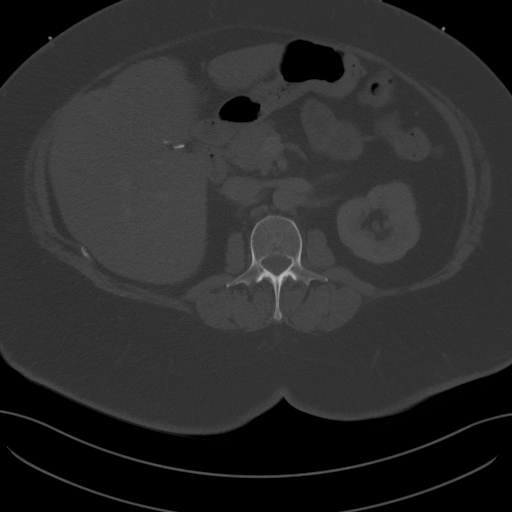
[im 66/91  soft-tissue]
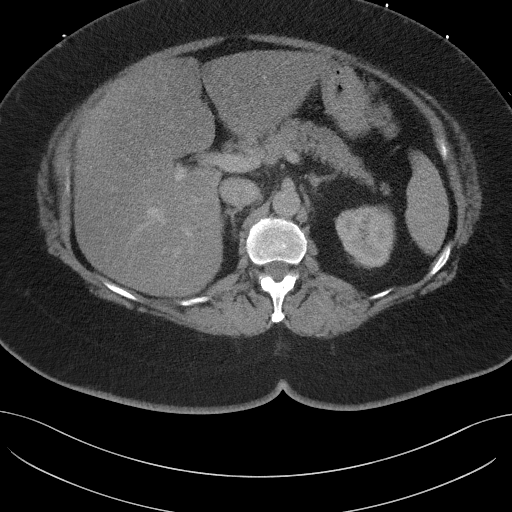
[im 71/91  soft-tissue]
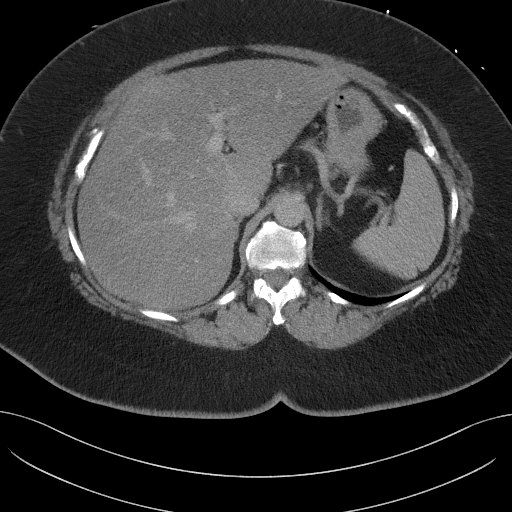
[im 81/91  soft-tissue]
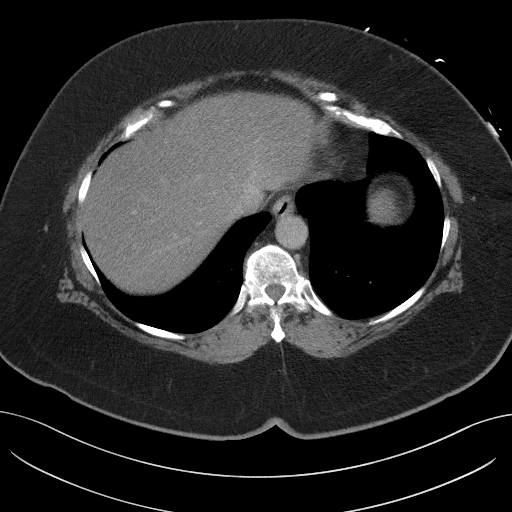
[im 86/91  soft-tissue]
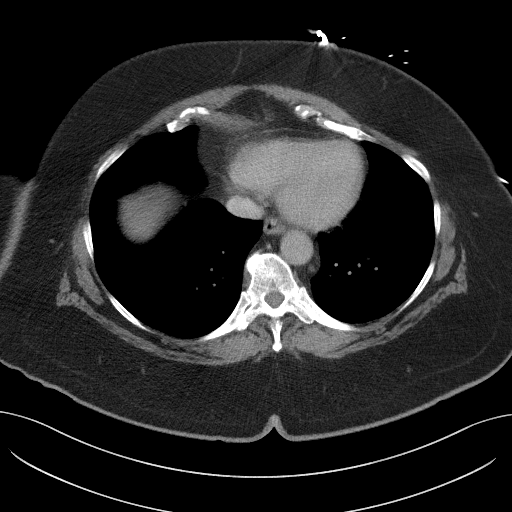

[Series 6: coronal st · coronal · 0.79mm/px · 3 of 117 slices shown]
[im 39/117  soft-tissue]
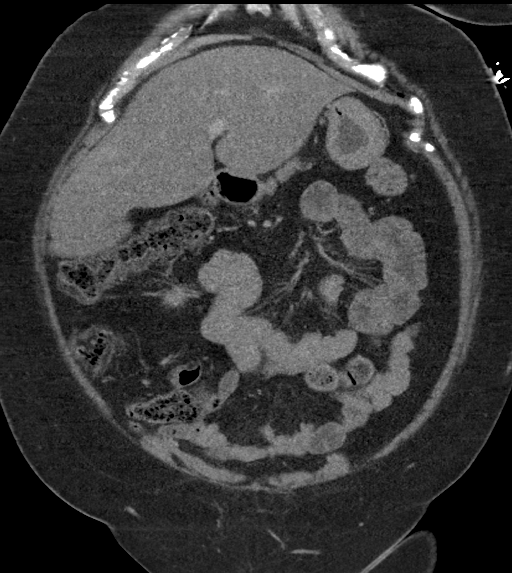
[im 52/117  soft-tissue]
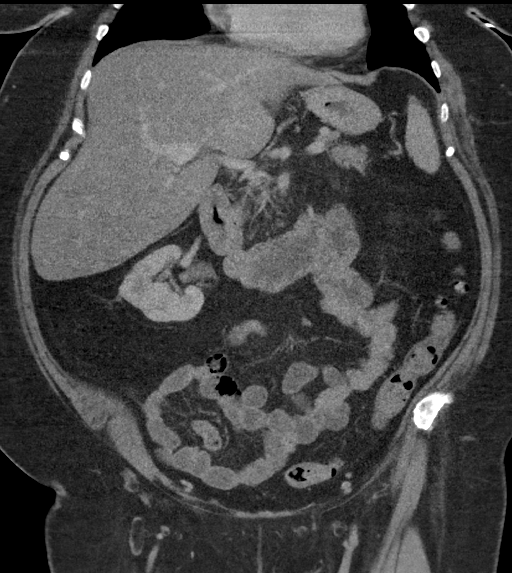
[im 65/117  soft-tissue]
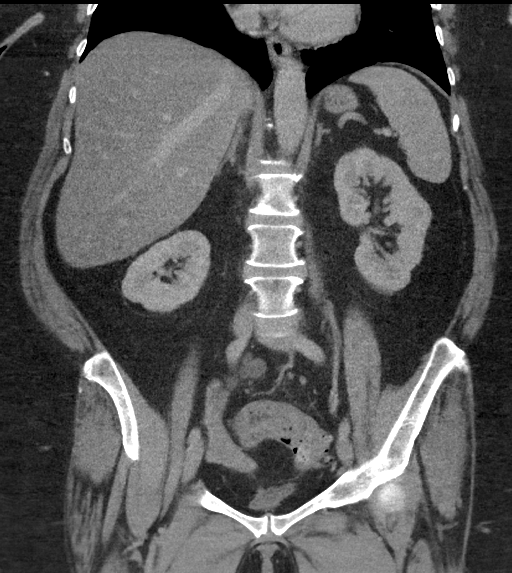

[16 of 46 positions shown; findings below may reference images not displayed]

FINDINGS: Lower chest: No acute abnormality.

Hepatobiliary: Diffuse hepatic steatosis. No focal liver
abnormality. Prior cholecystectomy. No biliary dilatation.

Pancreas: Unremarkable. No pancreatic ductal dilatation or
surrounding inflammatory changes.

Spleen: Normal in size without focal abnormality.

Adrenals/Urinary Tract: Adrenal glands are unremarkable. Kidneys are
normal, without renal calculi, focal lesion, or hydronephrosis.
Bladder is decompressed.

Stomach/Bowel: Stomach is within normal limits. Appendix appears
normal. No evidence of bowel wall thickening, distention, or
inflammatory changes. Left-sided colonic diverticulosis.

Vascular/Lymphatic: No significant vascular findings are present. No
enlarged abdominal or pelvic lymph nodes.

Reproductive: Uterus and bilateral adnexa are unremarkable.

Other: No abdominal wall hernia or abnormality. No abdominopelvic
ascites. No pneumoperitoneum.

Musculoskeletal: No acute or significant osseous findings. Unchanged
bone island in the right iliac crest. Advanced facet arthropathy
bilaterally at L4-L5 and L5-S1.
IMPRESSION: 1.  No acute intra-abdominal process.
2. Left-sided diverticulosis without evidence of acute
diverticulitis.
3. Diffuse hepatic steatosis.

## 2019-12-18 IMAGING — DX DG CHEST 2V
2 series · 2 of 2 positions shown · non-contrast
Comparison: September 26, 2017

CLINICAL DATA: Weakness and dizziness.  Chest pain

EXAM:
CHEST - 2 VIEW

[chest pa]
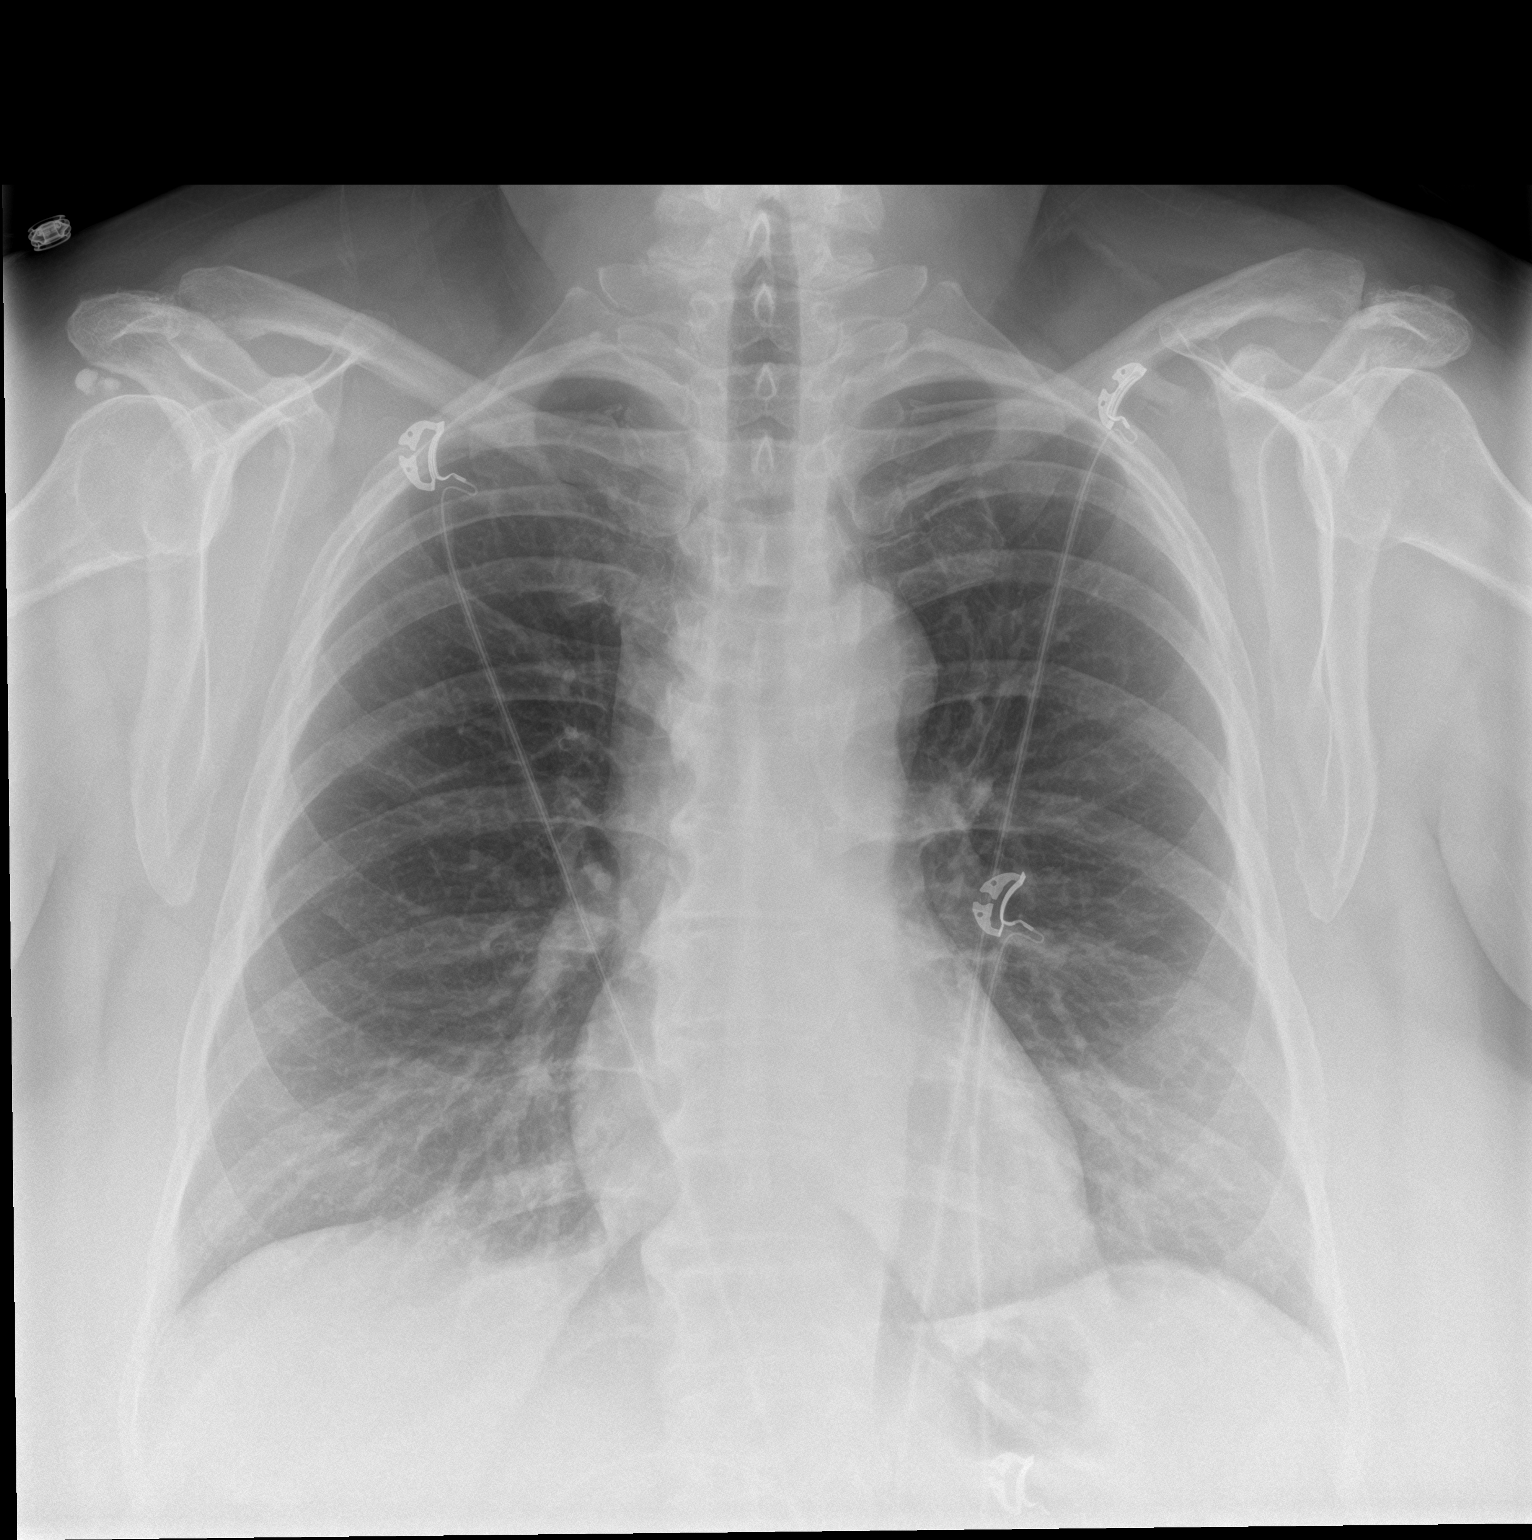

[chest lat]
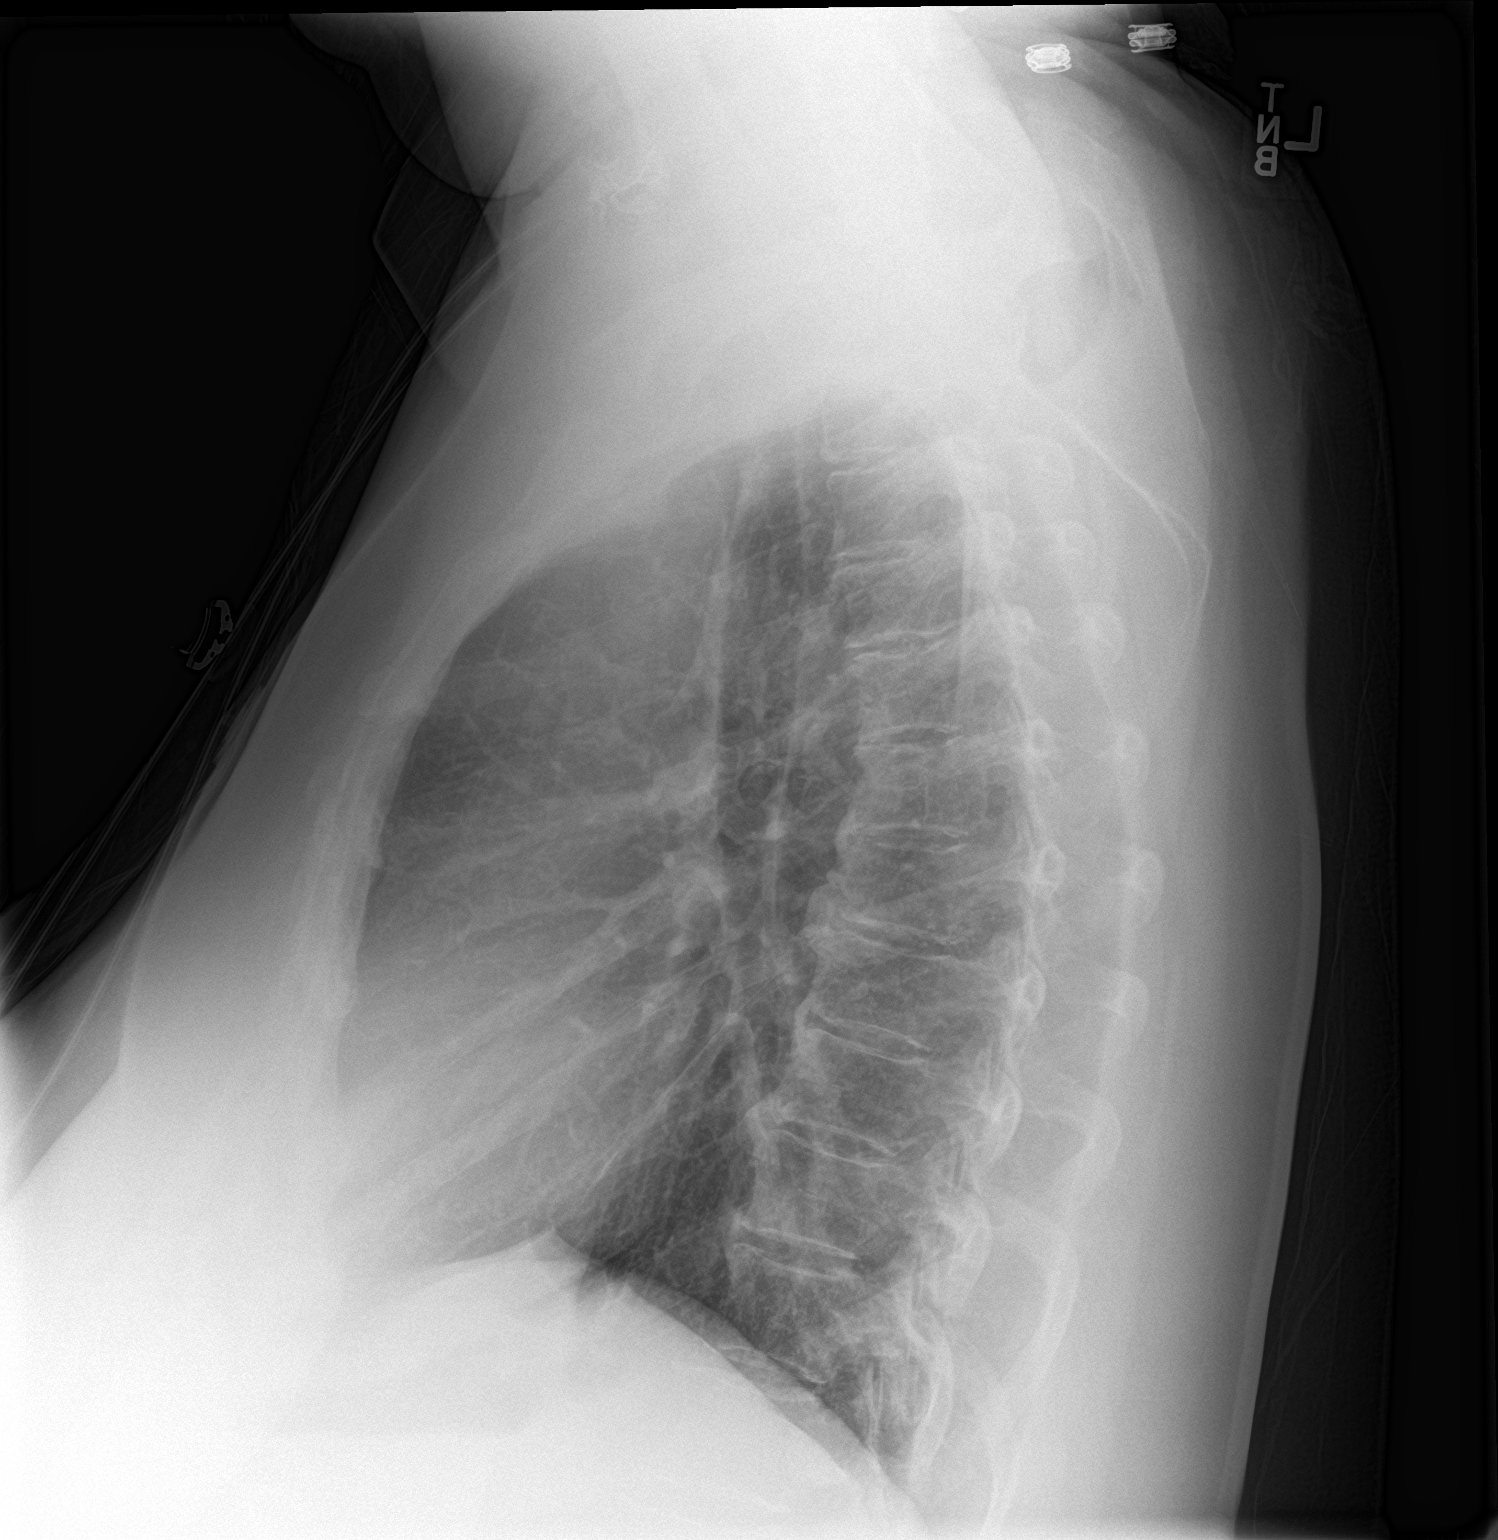

[2 of 2 positions shown; findings below may reference images not displayed]

FINDINGS: No edema or consolidation. The heart size and pulmonary vascularity
are normal. No adenopathy. No pneumothorax. There is degenerative
change in the thoracic spine.
IMPRESSION: No edema or consolidation.

## 2020-06-09 ENCOUNTER — Emergency Department (HOSPITAL_COMMUNITY)
Admission: EM | Admit: 2020-06-09 | Discharge: 2020-06-09 | Disposition: A | Discharge status: Home / Self Care | Payer: Self-pay | Attending: Emergency Medicine | Admitting: Emergency Medicine

## 2020-06-09 ENCOUNTER — Other Ambulatory Visit: Payer: Self-pay

## 2020-06-09 ENCOUNTER — Emergency Department (HOSPITAL_COMMUNITY): Payer: Self-pay

## 2020-06-09 DIAGNOSIS — E119 Type 2 diabetes mellitus without complications: Secondary | ICD-10-CM | POA: Insufficient documentation

## 2020-06-09 DIAGNOSIS — R1031 Right lower quadrant pain: Secondary | ICD-10-CM | POA: Insufficient documentation

## 2020-06-09 DIAGNOSIS — I2510 Atherosclerotic heart disease of native coronary artery without angina pectoris: Secondary | ICD-10-CM | POA: Insufficient documentation

## 2020-06-09 DIAGNOSIS — R6883 Chills (without fever): Secondary | ICD-10-CM | POA: Insufficient documentation

## 2020-06-09 DIAGNOSIS — E039 Hypothyroidism, unspecified: Secondary | ICD-10-CM | POA: Insufficient documentation

## 2020-06-09 DIAGNOSIS — K219 Gastro-esophageal reflux disease without esophagitis: Secondary | ICD-10-CM | POA: Insufficient documentation

## 2020-06-09 DIAGNOSIS — I10 Essential (primary) hypertension: Secondary | ICD-10-CM | POA: Insufficient documentation

## 2020-06-09 DIAGNOSIS — R519 Headache, unspecified: Secondary | ICD-10-CM | POA: Insufficient documentation

## 2020-06-09 DIAGNOSIS — R197 Diarrhea, unspecified: Secondary | ICD-10-CM | POA: Insufficient documentation

## 2020-06-09 DIAGNOSIS — R112 Nausea with vomiting, unspecified: Secondary | ICD-10-CM | POA: Insufficient documentation

## 2020-06-09 LAB — URINALYSIS, ROUTINE W REFLEX MICROSCOPIC
Bacteria, UA: NONE SEEN
Bilirubin Urine: NEGATIVE
Glucose, UA: NEGATIVE mg/dL
Hgb urine dipstick: NEGATIVE
Ketones, ur: NEGATIVE mg/dL
Leukocytes,Ua: NEGATIVE
Nitrite: NEGATIVE
Protein, ur: NEGATIVE mg/dL
Specific Gravity, Urine: 1.008 (ref 1.005–1.030)
pH: 6 (ref 5.0–8.0)

## 2020-06-09 LAB — CBC
HCT: 44.9 % (ref 36.0–46.0)
Hemoglobin: 14.3 g/dL (ref 12.0–15.0)
MCH: 27.9 pg (ref 26.0–34.0)
MCHC: 31.8 g/dL (ref 30.0–36.0)
MCV: 87.5 fL (ref 80.0–100.0)
Platelets: 356 10*3/uL (ref 150–400)
RBC: 5.13 MIL/uL — ABNORMAL HIGH (ref 3.87–5.11)
RDW: 13.4 % (ref 11.5–15.5)
WBC: 15.8 10*3/uL — ABNORMAL HIGH (ref 4.0–10.5)
nRBC: 0 % (ref 0.0–0.2)

## 2020-06-09 LAB — COMPREHENSIVE METABOLIC PANEL
ALT: 40 U/L (ref 0–44)
AST: 25 U/L (ref 15–41)
Albumin: 4.6 g/dL (ref 3.5–5.0)
Alkaline Phosphatase: 85 U/L (ref 38–126)
Anion gap: 12 (ref 5–15)
BUN: 10 mg/dL (ref 6–20)
CO2: 27 mmol/L (ref 22–32)
Calcium: 10.1 mg/dL (ref 8.9–10.3)
Chloride: 100 mmol/L (ref 98–111)
Creatinine, Ser: 0.55 mg/dL (ref 0.44–1.00)
GFR calc Af Amer: >60 mL/min (ref >60)
GFR calc non Af Amer: >60 mL/min (ref >60)
Glucose, Bld: 115 mg/dL — ABNORMAL HIGH (ref 70–99)
Potassium: 4 mmol/L (ref 3.5–5.1)
Sodium: 139 mmol/L (ref 135–145)
Total Bilirubin: 0.5 mg/dL (ref 0.3–1.2)
Total Protein: 8.4 g/dL — ABNORMAL HIGH (ref 6.5–8.1)

## 2020-06-09 LAB — CBG MONITORING, ED: Glucose-Capillary: 118 mg/dL — ABNORMAL HIGH (ref 70–99)

## 2020-06-09 LAB — LIPASE, BLOOD: Lipase: 29 U/L (ref 11–51)

## 2020-06-09 MED ORDER — ONDANSETRON HCL 4 MG/2ML IJ SOLN
4.0000 mg | Freq: Once | INTRAMUSCULAR | Status: AC
  Administered 2020-06-09: 4 mg via INTRAVENOUS
  Filled 2020-06-09: qty 2

## 2020-06-09 MED ORDER — LOPERAMIDE HCL 2 MG PO CAPS
2.0000 mg | ORAL_CAPSULE | Freq: Four times a day (QID) | ORAL | 0 refills | Status: DC | PRN
Start: 2020-06-09 — End: 2023-06-21

## 2020-06-09 MED ORDER — MORPHINE SULFATE (PF) 4 MG/ML IV SOLN
4.0000 mg | Freq: Once | INTRAVENOUS | Status: AC
  Administered 2020-06-09: 4 mg via INTRAVENOUS
  Filled 2020-06-09: qty 1

## 2020-06-09 MED ORDER — IOHEXOL 300 MG/ML  SOLN
100.0000 mL | Freq: Once | INTRAMUSCULAR | Status: AC | PRN
  Administered 2020-06-09: 100 mL via INTRAVENOUS

## 2020-06-09 MED ORDER — SODIUM CHLORIDE 0.9 % IV BOLUS
1000.0000 mL | Freq: Once | INTRAVENOUS | Status: AC
  Administered 2020-06-09: 1000 mL via INTRAVENOUS

## 2020-06-09 MED ORDER — ONDANSETRON HCL 4 MG PO TABS
4.0000 mg | ORAL_TABLET | Freq: Three times a day (TID) | ORAL | 0 refills | Status: DC | PRN
Start: 2020-06-09 — End: 2023-01-31

## 2020-06-09 NOTE — ED Triage Notes (Signed)
Pt arrived to ED during Epic downtime. The following pt information entered from downtime form as soon as Epic came back online. Triage initiated per downtime form at 1836.  Pt reports headache, diarrhea, dizziness for last several days. Pt reports Right lower quadrant pain since this morning and reports history of diverticulitis.

## 2020-06-09 NOTE — Discharge Instructions (Addendum)
You were seen in the emergency department for abdominal pain along with nausea vomiting and diarrhea.  Your blood work showed your infection cell count was elevated but your CAT scan did not show any significant findings.  We are starting you on some medication for nausea and for diarrhea.  Try to stay well-hydrated.  Follow-up with your regular doctor.  Return to the emergency department if any worsening or concerning symptoms

## 2020-06-09 NOTE — ED Provider Notes (Signed)
[facility] EMERGENCY DEPARTMENT Provider Note   CSN: [id number] Arrival date & time: 06/09/20  1719     History Chief Complaint  Patient presents with  . Abdominal Pain    Kristin Bailey is a 59 y.o. female.  She has history of diabetes hypertension coronary artery disease.  She is complaining of 3 days of headache diarrhea lightheadedness.  Started having abdominal pain since yesterday more right lower quadrant.  Rates it as 8 out of 10.  Mostly dull but does have some sharp stabbing episodes.  No blood in the diarrhea.  Similar pain to when she had diverticulitis and she ended up getting some bowel resection of her sigmoid.  Still has her appendix.  History of cholecystectomy tubal ligation C-section  The history is provided by the patient.  Abdominal Pain Pain location:  RLQ Pain quality: aching   Pain radiates to:  Does not radiate Pain severity now: 8/10. Onset quality:  Gradual Timing:  Constant Progression:  Worsening Chronicity:  New Context: not sick contacts and not trauma   Relieved by:  Nothing Worsened by:  Movement Ineffective treatments:  None tried Associated symptoms: chills, diarrhea and nausea   Associated symptoms: no chest pain, no cough, no dysuria, no fever, no hematemesis, no hematochezia, no hematuria, no melena, no shortness of breath, no sore throat and no vomiting        Past Medical History:  Diagnosis Date  . Anxiety   . Arthritis   . Coronary artery disease   . Depression   . Diabetes mellitus without complication ([place])   . Diverticulitis   . GERD (gastroesophageal reflux disease)   . Hypertension   . Hypothyroidism   . IBS (irritable bowel syndrome)   . Pneumonia   . Thyroid disease     Patient Active Problem List   Diagnosis Date Noted  . S/P partial colectomy 04/22/2019  . Sigmoid diverticulitis 04/22/2019  . Elevated lactic acid level   . Obesity, Class III, BMI 40-49.9 (morbid obesity) ([place])   . Gastroesophageal reflux  disease   . Sepsis without acute organ dysfunction ([place]) 03/02/2019  . Syncope 03/02/2019  . HTN (hypertension) 03/02/2019  . MDD (major depressive disorder), recurrent episode, mild ([place]) 09/19/2018  . Abdominal pain, left lower quadrant   . Diverticulitis 09/26/2017  . Hyponatremia 09/26/2017  . Noninfectious gastroenteritis 11/28/2016  . Enteritis 11/04/2016  . Lactic acidosis 11/04/2016  . Enterocolitis 03/17/2016  . Gastroenteritis, infectious 03/17/2016  . Leukocytosis 11/28/2015  . Acute respiratory failure with hypoxia ([place]) 11/28/2015  . Controlled type 2 diabetes mellitus without complication, without long-term current use of insulin ([place]) 11/28/2015  . UTI (lower urinary tract infection) 11/27/2015  . Colitis 11/27/2015  . AKI (acute kidney injury) ([place]) 11/27/2015  . Depression with anxiety     Past Surgical History:  Procedure Laterality Date  . AGILE CAPSULE N/A 11/23/2016   Procedure: AGILE CAPSULE;  Surgeon: Rogene Houston, MD;  Location: [facility] ENDO SUITE;  Service: Endoscopy;  Laterality: N/A;  8:30  . BREAST EXCISIONAL BIOPSY Right   . CARPAL TUNNEL RELEASE    . CESAREAN SECTION    . CHOLECYSTECTOMY    . COLONOSCOPY N/A 08/28/2014   Procedure: COLONOSCOPY;  Surgeon: Rogene Houston, MD;  Location: [facility] SUITE;  Service: Endoscopy;  Laterality: N/A;  1030  . CYST REMOVAL HAND    . ESOPHAGOGASTRODUODENOSCOPY N/A 03/19/2016   Procedure: ESOPHAGOGASTRODUODENOSCOPY (EGD);  Surgeon: Rogene Houston, MD;  Location: [facility];  Service: Endoscopy;  Laterality: N/A;  . FLEXIBLE SIGMOIDOSCOPY N/A 09/28/2017   Procedure: FLEXIBLE SIGMOIDOSCOPY;  Surgeon: Rogene Houston, MD;  Location: [facility] SU[facility];  Service: Endoscopy;  Laterality: N/A;  . FRACTURE SURGERY     rt ankle   . GIVENS CAPSULE STUDY N/A 12/15/2016   Procedure: GIVENS CAPSULE STUDY;  Surgeon: Rogene Houston, MD;  Location: [facility] SU[facility];  Service: Endoscopy;  Laterality: N/A;  . KNEE SURGERY     meniscus  repair  . LAPAROSCOPIC PARTIAL COLECTOMY N/A 04/22/2019   Procedure: LAPAROSCOPIC PARTIAL SIGMOID COLECTOMY;  Surgeon: Virl Cagey, MD;  Location: [facility] ORS;  Service: General;  Laterality: N/A;  . TUBAL LIGATION       OB History    Gravida  1   Para  1   Term  1   Preterm      AB      Living  1     SAB      TAB      Ectopic      Multiple      Live Births              Family History  Problem Relation Age of Onset  . Cancer Mother        colon cancer age74  . Hypertension Mother   . Stroke Mother   . Diabetes Mother   . Non-Hodgkin's lymphoma Father   . Hypertension Father   . Diabetes Father   . Stroke Maternal Grandmother   . Kidney disease Paternal Aunt   . Cancer Paternal Aunt   . Kidney disease Paternal Uncle   . Cancer Paternal Uncle     Social History   Tobacco Use  . Smoking status: Never Smoker  . Smokeless tobacco: Never Used  Vaping Use  . Vaping Use: Never used  Substance Use Topics  . Alcohol use: No  . Drug use: No    Home Medications Prior to Admission medications   Medication Sig Start Date End Date Taking? Authorizing Provider  acetaminophen (TYLENOL 8 HOUR ARTHRITIS PAIN) 650 MG CR tablet Take 1,300 mg by mouth See admin instructions. Take 2 tablets daily. Take 2 tablets once a day as needed for mild pain.    [provider]  albuterol (PROVENTIL HFA;VENTOLIN HFA) 108 (90 BASE) MCG/ACT inhaler Inhale 2 puffs into the lungs every 4 (four) hours as needed for wheezing or shortness of breath.    [provider]  ALPRAZolam Duanne Moron) 0.25 MG tablet Take 1 tablet (0.25 mg total) by mouth 2 (two) times daily as needed for anxiety. 06/03/19   Norman Clay, MD  aspirin EC 81 MG tablet Take 81 mg by mouth daily.    [provider]  Bioflavonoid Products (ESTER-C) TABS Take 1 tablet by mouth daily.    [provider]  Biotin 10000 MCG TABS Take 10,000 mcg by mouth daily.    [provider]   cholecalciferol (VITAMIN D3) 25 MCG (1000 UT) tablet Take 1,000 Units by mouth daily.    [provider]  docusate sodium (COLACE) 100 MG capsule Take 1 capsule (100 mg total) by mouth 2 (two) times daily. 04/26/19   Virl Cagey, MD  DULoxetine (CYMBALTA) 60 MG capsule Take 1 capsule (60 mg total) by mouth 2 (two) times daily. 03/13/19   Norman Clay, MD  glipiZIDE (GLUCOTROL) 5 MG tablet Take 5 mg by mouth 2 (two) times daily before a meal.  [provider]  glucosamine-chondroitin 500-400 MG tablet Take 1 tablet by mouth 2 (two) times a day.    [provider]  levothyroxine (SYNTHROID, LEVOTHROID) 100 MCG tablet Take 100 mcg by mouth daily before breakfast.  08/16/17   [provider]  lisinopril-hydrochlorothiazide (ZESTORETIC) 20-12.5 MG tablet Take 1 tablet by mouth daily. Hold until follow up with PCP Patient taking differently: Take 0.5 tablets by mouth daily.  03/03/19 02/25/20  Barton Dubois, MD  loratadine (CLARITIN) 10 MG tablet Take 10 mg by mouth daily.    [provider]  meclizine (ANTIVERT) 25 MG tablet Take 1 tablet (25 mg total) by mouth 3 (three) times daily as needed for dizziness. 07/11/18   Julianne Rice, MD  Multiple Vitamin (MULTIVITAMIN WITH MINERALS) TABS tablet Take 1 tablet by mouth daily.    [provider]  Omega-3 Fatty Acids (OMEGA 3 500 PO) Take 500 mg by mouth at bedtime.    [provider]  omeprazole (PRILOSEC OTC) 20 MG tablet Take 1 tablet (20 mg total) by mouth daily. Patient taking differently: Take 20 mg by mouth at bedtime.  01/03/18   Rehman, Mechele Dawley, MD  ondansetron (ZOFRAN-ODT) 4 MG disintegrating tablet Take 1 tablet (4 mg total) by mouth every 6 (six) hours as needed for nausea. 04/26/19   Virl Cagey, MD  oxyCODONE (OXY IR/ROXICODONE) 5 MG immediate release tablet Take 1-2 tablets (5-10 mg total) by mouth every 4 (four) hours as needed for moderate pain. 04/26/19   Virl Cagey, MD  promethazine (PHENERGAN) 25 MG tablet Take 1 tablet (25 mg total) by mouth every 6 (six) hours as needed for nausea or vomiting. 03/22/18   Ashley Murrain, NP  psyllium (REGULOID) 0.52 g capsule Take 2 capsules (1.04 g total) by mouth daily as needed (constipation). 03/03/19   Barton Dubois, MD  traZODone (DESYREL) 150 MG tablet 150-225 mg at night 09/22/19   Hisada, Elie Goody, MD  valACYclovir (VALTREX) 1000 MG tablet Take 2,000 mg by mouth See admin instructions. As needed for fever blisters, take 2 tablets twice a day for two doses.    [provider]    Allergies    Patient has no known allergies.  Review of Systems   Review of Systems  Constitutional: Positive for chills. Negative for fever.  HENT: Negative for sore throat.   Eyes: Negative for visual disturbance.  Respiratory: Negative for cough and shortness of breath.   Cardiovascular: Negative for chest pain.  Gastrointestinal: Positive for abdominal pain, diarrhea and nausea. Negative for hematemesis, hematochezia, melena and vomiting.  Genitourinary: Negative for dysuria and hematuria.  Musculoskeletal: Negative for neck pain.  Skin: Negative for rash.  Neurological: Positive for headaches.    Physical Exam Updated Vital Signs BP (!) 145/94   Pulse 84   Temp 98.9 F (37.2 C) (Oral)   Resp 18   Ht 5\' 6"  (1.676 m)   Wt (!) 124.3 kg   LMP 03/27/2014 Comment: irregular  SpO2 98%   BMI 44.22 kg/m   Physical Exam Vitals and nursing note reviewed.  Constitutional:      General: She is not in acute distress.    Appearance: She is well-developed. She is obese.  HENT:     Head: Normocephalic and atraumatic.  Eyes:     Conjunctiva/sclera: Conjunctivae normal.  Cardiovascular:     Rate and Rhythm: Normal rate and regular rhythm.     Heart sounds: No murmur heard.   Pulmonary:  Effort: Pulmonary effort is normal. No respiratory distress.     Breath sounds: Normal breath sounds.  Abdominal:      Palpations: Abdomen is soft.     Tenderness: There is abdominal tenderness in the right lower quadrant and periumbilical area. There is no guarding or rebound.  Musculoskeletal:        General: No deformity or signs of injury. Normal range of motion.     Cervical back: Neck supple.  Skin:    General: Skin is warm and dry.  Neurological:     General: No focal deficit present.     Mental Status: She is alert.     ED Results / Procedures / Treatments   Labs (all labs ordered are listed, but only abnormal results are displayed) Labs Reviewed  COMPREHENSIVE METABOLIC PANEL - Abnormal; Notable for the following components:      Result Value   Glucose, Bld 115 (*)    Total Protein 8.4 (*)    All other components within normal limits  CBC - Abnormal; Notable for the following components:   WBC 15.8 (*)    RBC 5.13 (*)    All other components within normal limits  URINALYSIS, ROUTINE W REFLEX MICROSCOPIC - Abnormal; Notable for the following components:   Color, Urine STRAW (*)    All other components within normal limits  CBG MONITORING, ED - Abnormal; Notable for the following components:   Glucose-Capillary 118 (*)    All other components within normal limits  LIPASE, BLOOD    EKG EKG Interpretation  Date/Time:  Tuesday June 09 2020 18:38:43 EDT Ventricular Rate:  79 PR Interval:  166 QRS Duration: 80 QT Interval:  362 QTC Calculation: 415 R Axis:   37 Text Interpretation: Normal sinus rhythm Nonspecific ST and T wave abnormality Abnormal ECG rate and ischemic changes better than prior 4/20 Confirmed by Aletta Edouard [id number]) on 06/09/2020 9:09:00 PM   Radiology CT Abdomen Pelvis W Contrast  Result Date: 06/09/2020 CLINICAL DATA:  Right lower quadrant pain since this morning. EXAM: CT ABDOMEN AND PELVIS WITH CONTRAST TECHNIQUE: Multidetector CT imaging of the abdomen and pelvis was performed using the standard protocol following bolus administration of intravenous  contrast. CONTRAST:  164mL OMNIPAQUE IOHEXOL 300 MG/ML  SOLN COMPARISON:  Most recent CT 03/02/2019 FINDINGS: Lower chest: Lung bases are clear. Hepatobiliary: The liver is enlarged spanning 21.6 cm cranial caudal. There is diffuse hepatic steatosis. No focal liver abnormality. Clips in the gallbladder fossa postcholecystectomy. No biliary dilatation. Pancreas: No ductal dilatation or inflammation. Spleen: Normal in size without focal abnormality. Adrenals/Urinary Tract: Normal adrenal glands. No hydronephrosis or perinephric edema. Bilateral lobulated renal contours. Homogeneous renal enhancement with symmetric excretion on delayed phase imaging. Subcentimeter low-density lesion in the lower right kidney is too small to characterize but likely small cysts. Urinary bladder is partially distended without wall thickening. Stomach/Bowel: Small dependent hyperdensities in the stomach are typically ingested material. There is no gastric wall thickening. No small bowel obstruction or inflammation. Normal appendix, series 5, image 47. Colonic redundancy. Portions of colon are nondistended which limits assessment for wall thickening. Diverticulosis involving the descending colon. Enteric chain sutures in the sigmoid. No colonic wall thickening or pericolonic edema. Vascular/Lymphatic: Normal caliber abdominal aorta. Patent portal vein. No acute vascular findings. No abdominopelvic adenopathy. Reproductive: Uterus and bilateral adnexa are unremarkable. Other: No free air, free fluid, or intra-abdominal fluid collection. Postsurgical change of the anterior abdominal wall. Musculoskeletal: There are no acute or suspicious osseous abnormalities.  Degenerative change in the lower lumbar spine with facet hypertrophy. IMPRESSION: 1. No acute abnormality or explanation for right lower quadrant pain. Normal appendix. 2. Hepatomegaly and hepatic steatosis. 3. Colonic diverticulosis without diverticulitis. Electronically Signed   By:  Keith Rake M.D.   On: 06/09/2020 22:26    Procedures Procedures (including critical care time)  Medications Ordered in ED Medications  morphine 4 MG/ML injection 4 mg (4 mg Intravenous Given 06/09/20 2157)  ondansetron (ZOFRAN) injection 4 mg (4 mg Intravenous Given 06/09/20 2157)  sodium chloride 0.9 % bolus 1,000 mL (0 mLs Intravenous Stopped 06/09/20 2244)  iohexol (OMNIPAQUE) 300 MG/ML solution 100 mL (100 mLs Intravenous Contrast Given 06/09/20 2205)    ED Course  I have reviewed the triage vital signs and the nursing notes.  Pertinent labs & imaging results that were available during my care of the patient were reviewed by me and considered in my medical decision making (see chart for details).  Clinical Course as of Jun 10 1129  Tue Jun 09, 2020  2231 CT did not show any acute findings to explain patient's symptoms.  Likely due to some gastroenteritis with her diarrhea.  Will review with patient.   [MB]  2237 Patient states she did have some rectal bleeding once yesterday but has not had any since then.  Does have hemorrhoids.   [MB]    Clinical Course User Index [MB] Hayden Rasmussen, MD   MDM Rules/Calculators/A&P                         This patient complains of right lower abdominal pain; this involves an extensive number of treatment Options and is a complaint that carries with it a high risk of complications and Morbidity. The differential includes appendicitis, colitis, obstruction, renal colic, gastroenteritis  I ordered, reviewed and interpreted labs, which included CBC with elevated white count, normal hemoglobin, chemistries and LFTs normal other than mildly elevated glucose low protein, urinalysis without signs of infection I ordered medication IV pain medicine fluids and nausea medication with improvement in her symptoms I ordered imaging studies which included CT abdomen and pelvis and I independently    visualized and interpreted imaging which showed no  acute findings, no appendicitis Additional history obtained from patient's husband Previous records obtained and reviewed in epic, no recent visits  After the interventions stated above, I reevaluated the patient and found patient symptoms to be improved.  We discussed symptomatic management with Imodium and Zofran.  Return instructions discussed.   Final Clinical Impression(s) / ED Diagnoses Final diagnoses:  Right lower quadrant abdominal pain  Nausea vomiting and diarrhea    Rx / DC Orders ED Discharge Orders         Ordered    loperamide (IMODIUM) 2 MG capsule  4 times daily PRN     Discontinue  Reprint     06/09/20 2236    ondansetron (ZOFRAN) 4 MG tablet  Every 8 hours PRN     Discontinue  Reprint     06/09/20 2236           Hayden Rasmussen, MD 06/10/20 1132

## 2020-07-09 ENCOUNTER — Ambulatory Visit: Payer: Self-pay | Attending: Internal Medicine

## 2020-07-09 DIAGNOSIS — Z23 Encounter for immunization: Secondary | ICD-10-CM

## 2020-07-09 NOTE — Progress Notes (Signed)
   Covid-19 Vaccination Clinic  Name:  Kristin Bailey    MRN: [id number] DOB: [birth year]  07/09/2020  [name] was observed post Covid-19 immunization for 15 minutes without incident. She was provided with Vaccine Information Sheet and instruction to access the V-Safe system.   [name] was instructed to call 911 with any severe reactions post vaccine: Marland Kitchen Difficulty breathing  . Swelling of face and throat  . A fast heartbeat  . A bad rash all over body  . Dizziness and weakness   Immunizations Administered    Name Date Dose VIS Date Route   Pfizer COVID-19 Vaccine 07/09/2020 10:31 AM 0.3 mL 01/08/2019 Intramuscular   Manufacturer: [place]   Lot: [id number]   [place]: [id number]

## 2020-07-30 ENCOUNTER — Ambulatory Visit: Payer: Self-pay | Attending: Internal Medicine

## 2020-07-30 DIAGNOSIS — Z23 Encounter for immunization: Secondary | ICD-10-CM

## 2020-07-30 NOTE — Progress Notes (Signed)
   Covid-19 Vaccination Clinic  Name:  Kristin Bailey    MRN: [id number] DOB: [birth year]  07/30/2020  Kristin Bailey was observed post Covid-19 immunization for 15 minutes without incident. She was provided with Vaccine Information Sheet and instruction to access the V-Safe system.   Kristin Bailey was instructed to call 911 with any severe reactions post vaccine: Marland Kitchen Difficulty breathing  . Swelling of face and throat  . A fast heartbeat  . A bad rash all over body  . Dizziness and weakness   Immunizations Administered    Name Date Dose VIS Date Route   Pfizer COVID-19 Vaccine 07/30/2020 11:02 AM 0.3 mL 01/08/2019 Intramuscular   Manufacturer: [place]   Lot: [id number]   [place]: [id number]

## 2020-09-16 ENCOUNTER — Other Ambulatory Visit: Payer: Self-pay | Admitting: Internal Medicine

## 2020-09-16 ENCOUNTER — Other Ambulatory Visit (HOSPITAL_COMMUNITY): Payer: Self-pay | Admitting: Internal Medicine

## 2020-09-16 DIAGNOSIS — E041 Nontoxic single thyroid nodule: Secondary | ICD-10-CM

## 2020-09-22 ENCOUNTER — Ambulatory Visit (HOSPITAL_COMMUNITY): Admission: RE | Admit: 2020-09-22 | Payer: BLUE CROSS/BLUE SHIELD | Source: Ambulatory Visit

## 2020-09-22 ENCOUNTER — Encounter (HOSPITAL_COMMUNITY): Payer: Self-pay

## 2020-09-23 ENCOUNTER — Other Ambulatory Visit (HOSPITAL_COMMUNITY): Payer: Self-pay

## 2020-09-23 ENCOUNTER — Ambulatory Visit (HOSPITAL_COMMUNITY): Payer: BLUE CROSS/BLUE SHIELD

## 2021-01-18 ENCOUNTER — Other Ambulatory Visit (HOSPITAL_COMMUNITY): Payer: Self-pay | Admitting: Internal Medicine

## 2021-01-18 DIAGNOSIS — E039 Hypothyroidism, unspecified: Secondary | ICD-10-CM

## 2021-01-27 ENCOUNTER — Ambulatory Visit (HOSPITAL_COMMUNITY)
Admission: RE | Admit: 2021-01-27 | Discharge: 2021-01-27 | Disposition: A | Discharge status: Home / Self Care | Payer: 59 | Source: Ambulatory Visit | Attending: Internal Medicine | Admitting: Internal Medicine

## 2021-01-27 ENCOUNTER — Other Ambulatory Visit: Payer: Self-pay

## 2021-01-27 DIAGNOSIS — E039 Hypothyroidism, unspecified: Secondary | ICD-10-CM | POA: Diagnosis not present

## 2021-02-05 ENCOUNTER — Other Ambulatory Visit (HOSPITAL_COMMUNITY): Payer: Self-pay | Admitting: Family Medicine

## 2021-02-05 DIAGNOSIS — M545 Low back pain, unspecified: Secondary | ICD-10-CM

## 2021-02-11 ENCOUNTER — Other Ambulatory Visit: Payer: Self-pay

## 2021-02-11 ENCOUNTER — Ambulatory Visit (HOSPITAL_COMMUNITY)
Admission: RE | Admit: 2021-02-11 | Discharge: 2021-02-11 | Disposition: A | Discharge status: Home / Self Care | Payer: 59 | Source: Ambulatory Visit | Attending: Family Medicine | Admitting: Family Medicine

## 2021-02-11 DIAGNOSIS — M545 Low back pain, unspecified: Secondary | ICD-10-CM | POA: Insufficient documentation

## 2021-02-19 ENCOUNTER — Encounter: Payer: Self-pay | Admitting: Dietician

## 2021-02-19 ENCOUNTER — Encounter: Payer: 59 | Attending: Internal Medicine | Admitting: Dietician

## 2021-02-19 VITALS — Ht 66.0 in | Wt 258.8 lb

## 2021-02-19 DIAGNOSIS — E119 Type 2 diabetes mellitus without complications: Secondary | ICD-10-CM

## 2021-02-19 NOTE — Patient Instructions (Addendum)
Begin to recognize carbohydrates in your food choices!  Work towards eating three meals a day, about 5-6 hours apart!  Have 3 carb choices at each meal (45 g).   Begin to build your meals using the proportions of the Balanced Plate. . First, select your carb choice(s) for the meal, and determine how much you should have to equal 3 carb choices (45 g). . Next, select your source of protein to pair with your carb choice(s). . Finally, complete the remaining half of your meal with a variety of non-starchy vegetables.  Avoid eating within 2 hours of bed time.

## 2021-02-19 NOTE — Progress Notes (Signed)
Medical Nutrition Therapy  Appointment Start time:  72  Appointment End time:  1120  Primary concerns today: Diabetic nutrition  Referral diagnosis: E11.69 Type 2 diabetes mellitus Preferred learning style: No preference indicated Learning readiness: Contemplating   NUTRITION ASSESSMENT   Anthropometrics  Ht: 5'6" Wt: 258.8 lbs Body mass index is 41.77 kg/m.   Clinical Medical Hx: Depression/anxiety, arthritis, HTN Medications: Ozempic, glipizide Labs: A1c - 7.3 Notable Signs/Symptoms: N/A  Lifestyle & Dietary Hx Pt reports consistent leg and lower back pain. Pt is on an anti-inflammatory medication that helps. Pt states they get frustrated that they are unable to do day to day things due to this pain. Pt reports filing for disability recently due to physical and mental health concerns.  Pt states they have to force themselves to do things because they don't have the motivation to leave the house. Pt reports they were seeing a psychiatrist before the pandemic but stopped. Pt is in the process of finding a new mental health provider. Pt reports they are they type of person that needs to be busy. 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 Pt reports trying to lose weight, has lost 13 pounds in the past 2 months. Pt has been watching sugar and carb intake. Pt reports using Weight Watchers in the past and lost 40 pounds, but gained it back. Pt reports FBG numbers in the low 200's a month ago, states Ozempic has helped.  FBG is consistently in the low 100s now, unless they eat late in the evening. Pt reports spells of nausea, lightheadedness, sweatiness occasionally. States they checked their sugar and it was at a normal level when these symptoms occur. Pt states PCP informed them that these symptoms may occur at higher numbers if they have a history of hyperglycemia.   Estimated daily fluid intake: ~48 oz Supplements: Vit D,  Sleep: Insomnia, sleeps ~6-8 hours, pain  interrupts sleep Stress / self-care: GAD due to physical pain Current average weekly physical activity: ADLs   24-Hr Dietary Recall First Meal: Equate protein shake, coffee w/sugar free creamer, water Snack: none Second Meal: pretzel chips and hummus Snack: none Third Meal: Surveyor, quantity Frozen pizza, salad w/ ranch, Tea with Stevia Snack: 2 hot dogs dipped in ketchup, 1 sugar free Reese's cup Beverages: water, coffee   NUTRITION DIAGNOSIS  NB-1.1 Food and nutrition-related knowledge deficit As related to diabetes.  As evidenced by A1c of 7.3, long history of DM, and no prior nutrition education.   NUTRITION INTERVENTION  Nutrition education (E-1) on the following topics:  Educated patient on the pathophysiology of diabetes. This includes why our bodies need circulating blood sugar, the relationship between insulin and blood sugar, and the results of insulin resistance and/or pancreatic insufficiency on the development of diabetes. Educated patient on factors that contribute to elevation of blood sugars, such as stress, illness, injury,and food choices. Discussed the role that physical activity plays in lowering blood sugar. Educate patient on the three main macronutrients. Protein, fats, and carbohydrates. Discussed how each of these macronutrients affect blood sugar levels, especially carbohydrate, and the importance of eating a consistent amount of carbohydrate throughout the day. Educated patient on carbohydrate counting, 15g of carbohydrate equals one carb choice. Advised patient on the importance of consistently checking their blood sugar, and recognizing how lifestyle and food choices affect those numbers. Educated patient on the "Rule of 15". Treat low blood sugar, below 70 mg/dL, by eating 15g of fast acting carbohydrate and  rechecking blood sugar after 15 minutes. If blood sugar is still low, repeat. Discussed the role of diabetes medications in getting blood sugar  under control, and considerations that may need to be taken to avoid complications.   Handouts Provided Include   Yellow Meal Card  Balanced Plate  Snack Sheet  Carb Counting   Learning Style & Readiness for Change Teaching method utilized: Visual & Auditory  Demonstrated degree of understanding via: Teach Back  Barriers to learning/adherence to lifestyle change: none  Goals Established by Pt  Begin to recognize carbohydrates in your food choices!  Work towards eating three meals a day, about 5-6 hours apart!  Have 3 carb choices at each meal (45 g).   Begin to build your meals using the proportions of the Balanced Plate.  First, select your carb choice(s) for the meal, and determine how much you should have to equal 3 carb choices (45 g).  Next, select your source of protein to pair with your carb choice(s).  Finally, complete the remaining half of your meal with a variety of non-starchy vegetables.  Avoid eating within 2 hours of bed time.   MONITORING & EVALUATION Dietary intake, weekly physical activity, and fasting blood sugar in 6 weeks.  Next Steps  Patient is to follow up with RDN.

## 2021-02-22 ENCOUNTER — Encounter (HOSPITAL_COMMUNITY): Payer: Self-pay | Admitting: *Deleted

## 2021-02-22 ENCOUNTER — Other Ambulatory Visit: Payer: Self-pay

## 2021-02-22 ENCOUNTER — Emergency Department (HOSPITAL_COMMUNITY)
Admission: EM | Admit: 2021-02-22 | Discharge: 2021-02-22 | Disposition: A | Discharge status: Home / Self Care | Payer: 59 | Attending: Emergency Medicine | Admitting: Emergency Medicine

## 2021-02-22 ENCOUNTER — Emergency Department (HOSPITAL_COMMUNITY): Payer: 59

## 2021-02-22 DIAGNOSIS — R6883 Chills (without fever): Secondary | ICD-10-CM | POA: Insufficient documentation

## 2021-02-22 DIAGNOSIS — R1031 Right lower quadrant pain: Secondary | ICD-10-CM | POA: Insufficient documentation

## 2021-02-22 DIAGNOSIS — I10 Essential (primary) hypertension: Secondary | ICD-10-CM | POA: Insufficient documentation

## 2021-02-22 DIAGNOSIS — R112 Nausea with vomiting, unspecified: Secondary | ICD-10-CM | POA: Insufficient documentation

## 2021-02-22 DIAGNOSIS — Z79899 Other long term (current) drug therapy: Secondary | ICD-10-CM | POA: Diagnosis not present

## 2021-02-22 DIAGNOSIS — D72829 Elevated white blood cell count, unspecified: Secondary | ICD-10-CM | POA: Insufficient documentation

## 2021-02-22 DIAGNOSIS — R000 Tachycardia, unspecified: Secondary | ICD-10-CM | POA: Diagnosis not present

## 2021-02-22 DIAGNOSIS — K219 Gastro-esophageal reflux disease without esophagitis: Secondary | ICD-10-CM | POA: Insufficient documentation

## 2021-02-22 DIAGNOSIS — R197 Diarrhea, unspecified: Secondary | ICD-10-CM | POA: Insufficient documentation

## 2021-02-22 DIAGNOSIS — I2510 Atherosclerotic heart disease of native coronary artery without angina pectoris: Secondary | ICD-10-CM | POA: Insufficient documentation

## 2021-02-22 DIAGNOSIS — E039 Hypothyroidism, unspecified: Secondary | ICD-10-CM | POA: Diagnosis not present

## 2021-02-22 DIAGNOSIS — Z7982 Long term (current) use of aspirin: Secondary | ICD-10-CM | POA: Insufficient documentation

## 2021-02-22 DIAGNOSIS — E119 Type 2 diabetes mellitus without complications: Secondary | ICD-10-CM | POA: Insufficient documentation

## 2021-02-22 DIAGNOSIS — Z7984 Long term (current) use of oral hypoglycemic drugs: Secondary | ICD-10-CM | POA: Diagnosis not present

## 2021-02-22 LAB — CBC WITH DIFFERENTIAL/PLATELET
Abs Immature Granulocytes: 0.1 10*3/uL — ABNORMAL HIGH (ref 0.00–0.07)
Basophils Absolute: 0.1 10*3/uL (ref 0.0–0.1)
Basophils Relative: 0 %
Eosinophils Absolute: 0 10*3/uL (ref 0.0–0.5)
Eosinophils Relative: 0 %
HCT: 48.3 % — ABNORMAL HIGH (ref 36.0–46.0)
Hemoglobin: 15.7 g/dL — ABNORMAL HIGH (ref 12.0–15.0)
Immature Granulocytes: 1 %
Lymphocytes Relative: 4 %
Lymphs Abs: 0.6 10*3/uL — ABNORMAL LOW (ref 0.7–4.0)
MCH: 28.1 pg (ref 26.0–34.0)
MCHC: 32.5 g/dL (ref 30.0–36.0)
MCV: 86.4 fL (ref 80.0–100.0)
Monocytes Absolute: 0.4 10*3/uL (ref 0.1–1.0)
Monocytes Relative: 2 %
Neutro Abs: 16.3 10*3/uL — ABNORMAL HIGH (ref 1.7–7.7)
Neutrophils Relative %: 93 %
Platelets: 309 10*3/uL (ref 150–400)
RBC: 5.59 MIL/uL — ABNORMAL HIGH (ref 3.87–5.11)
RDW: 13.6 % (ref 11.5–15.5)
WBC: 17.6 10*3/uL — ABNORMAL HIGH (ref 4.0–10.5)
nRBC: 0 % (ref 0.0–0.2)

## 2021-02-22 LAB — URINALYSIS, ROUTINE W REFLEX MICROSCOPIC
Bilirubin Urine: NEGATIVE
Glucose, UA: NEGATIVE mg/dL
Hgb urine dipstick: NEGATIVE
Ketones, ur: 5 mg/dL — AB
Leukocytes,Ua: NEGATIVE
Nitrite: NEGATIVE
Protein, ur: NEGATIVE mg/dL
Specific Gravity, Urine: 1.027 (ref 1.005–1.030)
pH: 5 (ref 5.0–8.0)

## 2021-02-22 LAB — COMPREHENSIVE METABOLIC PANEL
ALT: 42 U/L (ref 0–44)
AST: 26 U/L (ref 15–41)
Albumin: 4.5 g/dL (ref 3.5–5.0)
Alkaline Phosphatase: 71 U/L (ref 38–126)
Anion gap: 13 (ref 5–15)
BUN: 20 mg/dL (ref 6–20)
CO2: 22 mmol/L (ref 22–32)
Calcium: 9.1 mg/dL (ref 8.9–10.3)
Chloride: 104 mmol/L (ref 98–111)
Creatinine, Ser: 0.63 mg/dL (ref 0.44–1.00)
GFR, Estimated: >60 mL/min (ref >60)
Glucose, Bld: 177 mg/dL — ABNORMAL HIGH (ref 70–99)
Potassium: 4.4 mmol/L (ref 3.5–5.1)
Sodium: 139 mmol/L (ref 135–145)
Total Bilirubin: 0.8 mg/dL (ref 0.3–1.2)
Total Protein: 8.1 g/dL (ref 6.5–8.1)

## 2021-02-22 LAB — LACTIC ACID, PLASMA
Lactic Acid, Venous: 2.1 mmol/L (ref 0.5–1.9)
Lactic Acid, Venous: 2.1 mmol/L (ref 0.5–1.9)

## 2021-02-22 MED ORDER — SODIUM CHLORIDE 0.9 % IV BOLUS
1000.0000 mL | Freq: Once | INTRAVENOUS | Status: AC
  Administered 2021-02-22: 1000 mL via INTRAVENOUS

## 2021-02-22 MED ORDER — ONDANSETRON 4 MG PO TBDP: 4.0000 mg | ORAL_TABLET | Freq: Three times a day (TID) | ORAL | 0 refills | Status: DC | PRN

## 2021-02-22 MED ORDER — HYDROCODONE-ACETAMINOPHEN 5-325 MG PO TABS: 1.0000 | ORAL_TABLET | Freq: Once | ORAL | Status: DC

## 2021-02-22 MED ORDER — MORPHINE SULFATE (PF) 4 MG/ML IV SOLN
4.0000 mg | Freq: Once | INTRAVENOUS | Status: AC
  Administered 2021-02-22: 4 mg via INTRAVENOUS
  Filled 2021-02-22: qty 1

## 2021-02-22 MED ORDER — IOHEXOL 300 MG/ML  SOLN
100.0000 mL | Freq: Once | INTRAMUSCULAR | Status: AC | PRN
  Administered 2021-02-22: 100 mL via INTRAVENOUS

## 2021-02-22 MED ORDER — ONDANSETRON HCL 4 MG/2ML IJ SOLN
4.0000 mg | Freq: Once | INTRAMUSCULAR | Status: AC
  Administered 2021-02-22: 4 mg via INTRAVENOUS
  Filled 2021-02-22: qty 2

## 2021-02-22 NOTE — ED Provider Notes (Signed)
[facility] EMERGENCY DEPARTMENT Provider Note   CSN: [id number] Arrival date & time: 02/22/21  1425     History Chief Complaint  Patient presents with  . Abdominal Pain    Kristin Bailey is a 60 y.o. female with past medical history of type II DM, anxiety, chronic back pain, diverticulitis, and IBS who presents the ED with complaints of right-sided abdominal pain, nausea, frequent episodes of loose stool, and chills.  On my examination, patient is uncomfortable and ill-appearing.  She is concerned for appendicitis.  She has had multiple abdominal surgeries including cholecystectomy.  Patient reports that she felt peripherally fine last evening, but this morning developed mild nausea symptoms.  This progressed to an episode of nonbloody emesis as well as multiple episodes of loose nonbloody stools.  She also describes worsening abdominal pain with defecation.  Her pain has been getting progressively worse and she is particularly uncomfortable on my initial examination.  Patient expresses concern for appendicitis, particularly given that she is already s/p cholecystectomy.  She points just above McBurney's point when describing her area of pain and discomfort.  She denies any chest pain, shortness of breath, cough, vaginal complaints, or other symptoms.  HPI     Past Medical History:  Diagnosis Date  . Anxiety   . Arthritis   . Coronary artery disease   . Depression   . Diabetes mellitus without complication ([place])   . Diverticulitis   . GERD (gastroesophageal reflux disease)   . Hypertension   . Hypothyroidism   . IBS (irritable bowel syndrome)   . Pneumonia   . Thyroid disease     Patient Active Problem List   Diagnosis Date Noted  . S/P partial colectomy 04/22/2019  . Sigmoid diverticulitis 04/22/2019  . Elevated lactic acid level   . Obesity, Class III, BMI 40-49.9 (morbid obesity) ([place])   . Gastroesophageal reflux disease   . Sepsis without acute organ dysfunction  ([place]) 03/02/2019  . Syncope 03/02/2019  . HTN (hypertension) 03/02/2019  . MDD (major depressive disorder), recurrent episode, mild ([place]) 09/19/2018  . Abdominal pain, left lower quadrant   . Diverticulitis 09/26/2017  . Hyponatremia 09/26/2017  . Noninfectious gastroenteritis 11/28/2016  . Enteritis 11/04/2016  . Lactic acidosis 11/04/2016  . Enterocolitis 03/17/2016  . Gastroenteritis, infectious 03/17/2016  . Leukocytosis 11/28/2015  . Acute respiratory failure with hypoxia ([place]) 11/28/2015  . Controlled type 2 diabetes mellitus without complication, without long-term current use of insulin ([place]) 11/28/2015  . UTI (lower urinary tract infection) 11/27/2015  . Colitis 11/27/2015  . AKI (acute kidney injury) ([place]) 11/27/2015  . Depression with anxiety     Past Surgical History:  Procedure Laterality Date  . AGILE CAPSULE N/A 11/23/2016   Procedure: AGILE CAPSULE;  Surgeon: Rogene Houston, MD;  Location: [facility] ENDO SUITE;  Service: Endoscopy;  Laterality: N/A;  8:30  . BREAST EXCISIONAL BIOPSY Right   . CARPAL TUNNEL RELEASE    . CESAREAN SECTION    . CHOLECYSTECTOMY    . COLONOSCOPY N/A 08/28/2014   Procedure: COLONOSCOPY;  Surgeon: Rogene Houston, MD;  Location: [facility] END[facility] SUITE;  Service: Endoscopy;  Laterality: N/A;  1030  . CYST REMOVAL HAND    . ESOPHAGOGASTRODUODENOSCOPY N/A 03/19/2016   Procedure: ESOPHAGOGASTRODUODENOSCOPY (EGD);  Surgeon: Rogene Houston, MD;  Location: [facility];  Service: Endoscopy;  Laterality: N/A;  . FLEXIBLE SIGMOIDOSCOPY N/A 09/28/2017   Procedure: FLEXIBLE SIGMOIDOSCOPY;  Surgeon: Rogene Houston, MD;  Location: [facility];  Service: Endoscopy;  Laterality: N/A;  . FRACTURE SURGERY     rt ankle   . GIVENS CAPSULE STUDY N/A 12/15/2016   Procedure: GIVENS CAPSULE STUDY;  Surgeon: Rogene Houston, MD;  Location: [facility];  Service: Endoscopy;  Laterality: N/A;  . KNEE SURGERY     meniscus repair  . LAPAROSCOPIC PARTIAL COLECTOMY N/A  04/22/2019   Procedure: LAPAROSCOPIC PARTIAL SIGMOID COLECTOMY;  Surgeon: Virl Cagey, MD;  Location: [facility] ORS;  Service: General;  Laterality: N/A;  . TUBAL LIGATION       OB History    Gravida  1   Para  1   Term  1   Preterm      AB      Living  1     SAB      IAB      Ectopic      Multiple      Live Births              Family History  Problem Relation Age of Onset  . Cancer Mother        colon cancer age74  . Hypertension Mother   . Stroke Mother   . Diabetes Mother   . Non-Hodgkin's lymphoma Father   . Hypertension Father   . Diabetes Father   . Stroke Maternal Grandmother   . Kidney disease Paternal Aunt   . Cancer Paternal Aunt   . Kidney disease Paternal Uncle   . Cancer Paternal Uncle     Social History   Tobacco Use  . Smoking status: Never Smoker  . Smokeless tobacco: Never Used  Vaping Use  . Vaping Use: Never used  Substance Use Topics  . Alcohol use: No  . Drug use: No    Home Medications Prior to Admission medications   Medication Sig Start Date End Date Taking? Authorizing Provider  acetaminophen (TYLENOL) 650 MG CR tablet Take 1,300 mg by mouth See admin instructions. Take 2 tablets daily. Take 2 tablets once a day as needed for mild pain.   Yes [provider]  ALPRAZolam (XANAX) 0.25 MG tablet Take 1 tablet (0.25 mg total) by mouth 2 (two) times daily as needed for anxiety. 06/03/19  Yes Hisada, Elie Goody, MD  ALPRAZolam Duanne Moron) 0.5 MG tablet Take 0.5 mg by mouth at bedtime. Take 2 tablets by mouth every evening 02/03/21  Yes [provider]  ascorbic acid (VITAMIN C) 500 MG tablet Take 500 mg by mouth daily.   Yes [provider]  aspirin EC 81 MG tablet Take 81 mg by mouth daily.   Yes [provider]  cholecalciferol (VITAMIN D3) 25 MCG (1000 UT) tablet Take 1,000 Units by mouth daily.   Yes [provider]  diclofenac (VOLTAREN) 75 MG EC tablet Take 75 mg by mouth 2 (two) times  daily. 02/01/21  Yes [provider]  docusate sodium (COLACE) 100 MG capsule Take 1 capsule (100 mg total) by mouth 2 (two) times daily. Patient taking differently: Take 300 mg by mouth every evening. 04/26/19  Yes Virl Cagey, MD  DULoxetine (CYMBALTA) 60 MG capsule Take 1 capsule (60 mg total) by mouth 2 (two) times daily. 03/13/19  Yes Hisada, Elie Goody, MD  gabapentin (NEURONTIN) 300 MG capsule Take 1 capsule by mouth at bedtime. 01/17/21  Yes [provider]  glipiZIDE (GLUCOTROL XL) 10 MG 24 hr tablet Take 10 mg by mouth daily. 12/09/20  Yes [provider]  glucosamine-chondroitin 500-400 MG  tablet Take 1 tablet by mouth 2 (two) times a day.   Yes [provider]  levothyroxine (SYNTHROID, LEVOTHROID) 100 MCG tablet Take 100 mcg by mouth daily before breakfast.  08/16/17  Yes [provider]  lisinopril-hydrochlorothiazide (ZESTORETIC) 20-12.5 MG tablet Take 1 tablet by mouth daily. Hold until follow up with PCP Patient taking differently: Take 0.5 tablets by mouth daily. 03/03/19 02/25/20 Yes Barton Dubois, MD  loperamide (IMODIUM) 2 MG capsule Take 1 capsule (2 mg total) by mouth 4 (four) times daily as needed for diarrhea or loose stools. 06/09/20  Yes Hayden Rasmussen, MD  loratadine (CLARITIN) 10 MG tablet Take 10 mg by mouth daily.   Yes [provider]  meclizine (ANTIVERT) 25 MG tablet Take 1 tablet (25 mg total) by mouth 3 (three) times daily as needed for dizziness. 07/11/18  Yes Julianne Rice, MD  Multiple Vitamin (MULTIVITAMIN WITH MINERALS) TABS tablet Take 1 tablet by mouth daily.   Yes [provider]  Omega-3 Fatty Acids (OMEGA 3 500 PO) Take 500 mg by mouth in the morning and at bedtime.   Yes [provider]  omeprazole (PRILOSEC OTC) 20 MG tablet Take 1 tablet (20 mg total) by mouth daily. Patient taking differently: Take 20 mg by mouth at bedtime. 01/03/18  Yes Rehman, Mechele Dawley, MD  ondansetron (ZOFRAN  ODT) 4 MG disintegrating tablet Take 1 tablet (4 mg total) by mouth every 8 (eight) hours as needed for nausea or vomiting. 02/22/21  Yes Corena Herter, PA-C  ondansetron (ZOFRAN) 4 MG tablet Take 1 tablet (4 mg total) by mouth every 8 (eight) hours as needed for nausea or vomiting. 06/09/20  Yes Hayden Rasmussen, MD  promethazine (PHENERGAN) 25 MG tablet Take 1 tablet (25 mg total) by mouth every 6 (six) hours as needed for nausea or vomiting. 03/22/18  Yes Neese, Hope M, NP  Semaglutide (OZEMPIC, 0.25 OR 0.5 MG/DOSE, [place]) Inject 0.5 mg into the skin once a week.   Yes [provider]  valACYclovir (VALTREX) 1000 MG tablet Take 2,000 mg by mouth See admin instructions. As needed for fever blisters, take 2 tablets twice a day for two doses.   Yes [provider]  albuterol (PROVENTIL HFA;VENTOLIN HFA) 108 (90 BASE) MCG/ACT inhaler Inhale 2 puffs into the lungs every 4 (four) hours as needed for wheezing or shortness of breath. Patient not taking: Reported on 02/22/2021    [provider]  Bioflavonoid Products (ESTER-C) TABS Take 1 tablet by mouth daily. Patient not taking: Reported on 02/22/2021    [provider]  Biotin 10000 MCG TABS Take 10,000 mcg by mouth daily. Patient not taking: Reported on 02/22/2021    [provider]  glipiZIDE (GLUCOTROL) 5 MG tablet Take 5 mg by mouth 2 (two) times daily before a meal.  Patient not taking: Reported on 02/22/2021    [provider]  oxyCODONE (OXY IR/ROXICODONE) 5 MG immediate release tablet Take 1-2 tablets (5-10 mg total) by mouth every 4 (four) hours as needed for moderate pain. Patient not taking: No sig reported 04/26/19   Virl Cagey, MD  psyllium (REGULOID) 0.52 g capsule Take 2 capsules (1.04 g total) by mouth daily as needed (constipation). Patient not taking: Reported on 02/22/2021 03/03/19   Barton Dubois, MD  traZODone (DESYREL) 150 MG tablet 150-225 mg at night Patient not taking: No  sig reported 09/22/19   Norman Clay, MD    Allergies    Patient has no  known allergies.  Review of Systems   Review of Systems  All other systems reviewed and are negative.   Physical Exam Updated Vital Signs BP 120/61   Pulse (!) 103   Temp 99.7 F (37.6 C)   Resp 17   LMP 03/27/2014 Comment: irregular  SpO2 97%   Physical Exam Vitals and nursing note reviewed. Exam conducted with a chaperone present.  Constitutional:      General: She is not in acute distress.    Appearance: She is ill-appearing. She is not toxic-appearing.  HENT:     Head: Normocephalic and atraumatic.  Eyes:     General: No scleral icterus.    Conjunctiva/sclera: Conjunctivae normal.  Cardiovascular:     Rate and Rhythm: Regular rhythm. Tachycardia present.     Pulses: Normal pulses.  Pulmonary:     Effort: Pulmonary effort is normal. No respiratory distress.  Abdominal:     General: Abdomen is flat. There is no distension.     Palpations: Abdomen is soft.     Tenderness: There is abdominal tenderness. There is right CVA tenderness. There is no guarding.     Comments: Right-sided abdominal tenderness to palpation, just superior to McBurney's point.  No significant lower abdominal tenderness to palpation.  Soft, nondistended.  Mild right-sided CVAT.  No overlying skin changes.  Musculoskeletal:     Right lower leg: No edema.     Left lower leg: No edema.  Skin:    General: Skin is dry.  Neurological:     Mental Status: She is alert and oriented to person, place, and time.     GCS: GCS eye subscore is 4. GCS verbal subscore is 5. GCS motor subscore is 6.  Psychiatric:        Mood and Affect: Mood normal.        Behavior: Behavior normal.        Thought Content: Thought content normal.     ED Results / Procedures / Treatments   Labs (all labs ordered are listed, but only abnormal results are displayed) Labs Reviewed  LACTIC ACID, PLASMA - Abnormal; Notable for the following components:       Result Value   Lactic Acid, Venous 2.1 (*)    All other components within normal limits  LACTIC ACID, PLASMA - Abnormal; Notable for the following components:   Lactic Acid, Venous 2.1 (*)    All other components within normal limits  URINALYSIS, ROUTINE W REFLEX MICROSCOPIC - Abnormal; Notable for the following components:   Ketones, ur 5 (*)    All other components within normal limits  CBC WITH DIFFERENTIAL/PLATELET - Abnormal; Notable for the following components:   WBC 17.6 (*)    RBC 5.59 (*)    Hemoglobin 15.7 (*)    HCT 48.3 (*)    Neutro Abs 16.3 (*)    Lymphs Abs 0.6 (*)    Abs Immature Granulocytes 0.10 (*)    All other components within normal limits  COMPREHENSIVE METABOLIC PANEL - Abnormal; Notable for the following components:   Glucose, Bld 177 (*)    All other components within normal limits  CULTURE, BLOOD (ROUTINE X 2)  CULTURE, BLOOD (ROUTINE X 2)    EKG None  Radiology CT ABDOMEN PELVIS W CONTRAST  Result Date: 02/22/2021 CLINICAL DATA:  Acute right-sided abdominal pain with nausea, vomiting, and diarrhea. History of prior partial sigmoid colectomy for diverticulitis in 2019. EXAM: CT ABDOMEN AND PELVIS WITH CONTRAST TECHNIQUE: Multidetector CT imaging of  the abdomen and pelvis was performed using the standard protocol following bolus administration of intravenous contrast. CONTRAST:  161mL OMNIPAQUE IOHEXOL 300 MG/ML  SOLN COMPARISON:  CT abdomen pelvis dated June 09, 2020. FINDINGS: Lower chest: No acute abnormality. Hepatobiliary: Unchanged diffuse hepatic steatosis. No focal liver abnormality. Prior cholecystectomy. No biliary dilatation. Pancreas: Unremarkable. No pancreatic ductal dilatation or surrounding inflammatory changes. Spleen: Normal in size without focal abnormality. Adrenals/Urinary Tract: Adrenal glands are unremarkable. Kidneys are normal, without renal calculi, focal lesion, or hydronephrosis. Bladder is largely decompressed. Stomach/Bowel:  Stomach is within normal limits. Appendix appears normal. No evidence of bowel wall thickening, distention, or inflammatory changes. Prior partial colectomy with anastomosis in the sigmoid colon. Vascular/Lymphatic: No significant vascular findings are present. No enlarged abdominal or pelvic lymph nodes. Reproductive: Uterus and bilateral adnexa are unremarkable. Other: No abdominal wall hernia or abnormality. No abdominopelvic ascites. No pneumoperitoneum. Musculoskeletal: No acute or significant osseous findings. Unchanged bone island in the right iliac crest. IMPRESSION: 1. No acute intra-abdominal process.  Normal appendix. 2. Unchanged hepatic steatosis. Electronically Signed   By: Titus Dubin M.D.   On: 02/22/2021 18:25    Procedures Procedures   Medications Ordered in ED Medications  ondansetron (ZOFRAN) injection 4 mg (4 mg Intravenous Given 02/22/21 1556)  morphine 4 MG/ML injection 4 mg (4 mg Intravenous Given 02/22/21 1557)  sodium chloride 0.9 % bolus 1,000 mL (0 mLs Intravenous Stopped 02/22/21 2015)  iohexol (OMNIPAQUE) 300 MG/ML solution 100 mL (100 mLs Intravenous Contrast Given 02/22/21 1749)    ED Course  I have reviewed the triage vital signs and the nursing notes.  Pertinent labs & imaging results that were available during my care of the patient were reviewed by me and considered in my medical decision making (see chart for details).    MDM Rules/Calculators/A&P                          TRIS HOWELL was evaluated in Emergency Department on 02/22/2021 for the symptoms described in the history of present illness. She was evaluated in the context of the global COVID-19 pandemic, which necessitated consideration that the patient might be at risk for infection with the SARS-CoV-2 virus that causes COVID-19. Institutional protocols and algorithms that pertain to the evaluation of patients at risk for COVID-19 are in a state of rapid change based on information released by  regulatory bodies including the CDC and federal and state organizations. These policies and algorithms were followed during the patient's care in the ED.  I personally reviewed patient's medical chart and all notes from triage and staff during today's encounter. I have also ordered and reviewed all labs and imaging that I felt to be medically necessary in the evaluation of this patient's complaints and with consideration of their physical exam. If needed, translation services were available and utilized.   Patient's history and physical exam concerning for obstructing ureterolithiasis versus appendicitis versus IBS versus obstruction.  We will proceed with laboratory work-up and temporize patient's symptoms with morphine and antiemetics.  We will keep her n.p.o.  Plan for CT abdomen and pelvis for further delineation.  Patient with leukocytosis to 17.6, but relatively consistent with her baseline.  No electrolyte derangement, renal impairment, or liver impairment on CMP.  Lactic acid very mildly elevated at 2.1, but likely in setting of dehydration due to her nausea, vomiting, and diarrhea.  Blood cultures are obtained and pending.  UA without evidence of infection.  CT demonstrates no acute intra-abdominal pathology.  Appendix is visualized and normal.  On subsequent evaluation, patient is accompanied by her husband who is at bedside.  He states that there has been a foodborne illness going around in the family.  He had a last week and their grandkids had it as well.  He states that he also was endorsing similar abdominal cramping pain, nausea, vomiting, and diarrhea.  After discussion with the patient and husband at bedside, feel as though outpatient observation is reasonable.  Close ED return precautions discussed.  Patient and husband voiced understanding and are agreeable to the plan.  Final Clinical Impression(s) / ED Diagnoses Final diagnoses:  Right lower quadrant abdominal pain  Nausea  vomiting and diarrhea    Rx / DC Orders ED Discharge Orders         Ordered    ondansetron (ZOFRAN ODT) 4 MG disintegrating tablet  Every 8 hours PRN        02/22/21 2200           Corena Herter, PA-C 02/22/21 2200    Varney Biles, MD 02/26/21 1754

## 2021-02-22 NOTE — Discharge Instructions (Addendum)
Your work-up today was reassuring.  However, the cause of your abdominal pain remains unclear.  Given that you have had recent sick contacts with similar symptoms, suspect that this is viral in etiology.  That explains why you have a slight fever.  However, I cannot emphasize enough the importance of repeat abdominal exams and close outpatient follow-up.  Many cases of abdominal pain can be observed and treated at home after initial evaluation in the emergency department. Most patient's with abdominal pain do not need to be admitted to the hospital or have surgery, but serious problems like appendicitis and gallbladder attacks can start out as nonspecific pain. Many abdominal conditions cannot be diagnosed in one visit, so follow-up evaluations are very important.  Please seek immediate care if you have any develop any of the following symptoms: The pain does not improve or gets worse.  You develop a fever or chills.  You keep throwing up (vomiting).  The pain is felt only in portions of the abdomen.  You pass bloody or black tarry stools.   You do not have a bowel movement for more than 4 days.  You have profuse (greater than 4/day), loose stools for more than 4 days.  I suspect that you are likely dehydrated.  Please take the Zofran ODT at home as needed for nausea symptoms.  I cannot emphasize enough the importance of increased oral hydration.  Continue with your at home pain regimen and you may take Tylenol as well for pain symptoms.  I also encourage Gas-X and other over-the-counter therapy such as Pepto-Bismol.  Please continue to check your temperature regularly and take Tylenol as needed for fever control.

## 2021-02-22 NOTE — ED Triage Notes (Signed)
Right lower quadrant pain with nausea

## 2021-02-22 NOTE — ED Notes (Signed)
Date and time results received: 02/22/21 1700 (use smartphrase ".now" to insert current time)  Test: lactic acid Critical Value: 2.1  Name of Provider Notified: Joaquin Courts, PA  Orders Received? Or Actions Taken?: na

## 2021-02-22 NOTE — ED Provider Notes (Signed)
MSE was initiated and I personally evaluated the patient and placed orders (if any) at  3:05 PM on February 22, 2021.  Patient is a 60 year old female s/p cholecystectomy and multiple other abdominal surgeries who presents the ED with complaints of acute onset right-sided abdominal pain.  She is also endorsing fevers and chills.  She is tachycardic here in the ED and borderline febrile.  We will plan to obtain sepsis work-up and ultimately CT abdomen pelvis with contrast.  She is concerned for appendicitis.  She has significant tenderness on my exam.  Denies any chest pain or shortness of breath.  Will provide Vicodin to temporize pain.    The patient appears stable so that the remainder of the MSE may be completed by another provider.   Corena Herter, PA-C 02/22/21 1512    Varney Biles, MD 02/26/21 1752

## 2021-02-22 NOTE — ED Notes (Signed)
Patient transported to CT 

## 2021-02-27 LAB — CULTURE, BLOOD (ROUTINE X 2)
Culture: NO GROWTH
Culture: NO GROWTH
Special Requests: ADEQUATE
Special Requests: ADEQUATE

## 2021-03-12 ENCOUNTER — Other Ambulatory Visit (HOSPITAL_COMMUNITY): Payer: Self-pay | Admitting: Internal Medicine

## 2021-03-12 ENCOUNTER — Other Ambulatory Visit: Payer: Self-pay | Admitting: Internal Medicine

## 2021-03-12 DIAGNOSIS — R1031 Right lower quadrant pain: Secondary | ICD-10-CM

## 2021-03-18 ENCOUNTER — Ambulatory Visit (HOSPITAL_COMMUNITY)
Admission: RE | Admit: 2021-03-18 | Discharge: 2021-03-18 | Disposition: A | Discharge status: Home / Self Care | Payer: 59 | Source: Ambulatory Visit | Attending: Internal Medicine | Admitting: Internal Medicine

## 2021-03-18 ENCOUNTER — Other Ambulatory Visit: Payer: Self-pay

## 2021-03-18 DIAGNOSIS — R1031 Right lower quadrant pain: Secondary | ICD-10-CM | POA: Diagnosis not present

## 2021-04-09 ENCOUNTER — Encounter: Payer: Self-pay | Admitting: Dietician

## 2021-04-09 ENCOUNTER — Encounter: Payer: 59 | Attending: Internal Medicine | Admitting: Dietician

## 2021-04-09 ENCOUNTER — Other Ambulatory Visit: Payer: Self-pay

## 2021-04-09 VITALS — Ht 66.0 in | Wt 253.0 lb

## 2021-04-09 DIAGNOSIS — E119 Type 2 diabetes mellitus without complications: Secondary | ICD-10-CM

## 2021-04-09 NOTE — Patient Instructions (Addendum)
Consider checking your blood sugar 2 hours after you start eating a meal to see how well your body is processing the food you are eating. Look for numbers under 180 mg/dL.  If you experience symptoms of low blood sugar, quickly consume 4 oz of fruit juice or soda, or 3-4 pieces of hard candy to get it back up.  Choose more whole grain options for your carb choices. Try to work up to making 50% of your grains whole grains.  Eat consistent carbs at each meal!!!

## 2021-04-09 NOTE — Progress Notes (Signed)
Medical Nutrition Therapy  Appointment Start time:  57  Appointment End time:  1110  Primary concerns today: Diabetic nutrition  Referral diagnosis: E11.69 Type 2 diabetes mellitus Preferred learning style: No preference indicated Learning readiness: Contemplating   NUTRITION ASSESSMENT   Anthropometrics  Ht: 5'6" Wt: 253 lbs Wt Change: -6 lbs Body mass index is 40.84 kg/m.   Clinical Medical Hx: Depression/anxiety, arthritis, HTN, Diverticulitis. Medications: Ozempic, glipizide Labs: A1c - 6.4 NEW Notable Signs/Symptoms: N/A   Lifestyle & Dietary Hx Pt had their A1c checked last week and it has gone down from 7.3 to 6.4.  Pt had knees evaluated and was determined to need surgery. Pt was advised they need to have a BMI below 35 to be eligible based on their insurance guidelines. Pt is determined to lose weight to get the surgery. Pt reports FBG numbers of 105-130. Pt reports occasional spells of shakiness,and mental fog still. Pt states it comes on very quickly and will eat something. Symptoms subside within 10-15 minutes Pt has history of diverticulitis and had about 12 inches of sigmoid colon removed, pt has had a couple instances of lower GI distress involving diarrhea and vomiting without a definitive cause.  Pt reports trying to be more mindful, has decreased portions of chocolate and switched to almond milk.  Pt states that it is more difficult to control carb intake when eating away from home, but they are trying to choose healthier options.   Estimated daily fluid intake: ~48 oz Supplements: Vit D,  Sleep: Insomnia, sleeps ~6-8 hours, pain interrupts sleep Stress / self-care: GAD due to physical pain Current average weekly physical activity: ADLs   24-Hr Dietary Recall First Meal: 2 fried eggs, 2 pieces of honey wheat toast w/grape jelly, reduced fat butter, coffee w/suagr free creamer Snack: none Second Meal: Tuna salad w/mayonnaise & relish, protein shake,  bottle of water Snack: none Third Meal: BBQ chicken thigh and leg, green beans Snack: [employer] [place] [employer], 1 taco, diet soda Beverages: water, coffee   NUTRITION DIAGNOSIS  NB-1.1 Food and nutrition-related knowledge deficit As related to diabetes.  As evidenced by A1c of 7.3, long history of DM, and no prior nutrition education.   NUTRITION INTERVENTION  Nutrition education (E-1) on the following topics:  Educated patient on the pathophysiology of diabetes. This includes why our bodies need circulating blood sugar, the relationship between insulin and blood sugar, and the results of insulin resistance and/or pancreatic insufficiency on the development of diabetes. Educated patient on factors that contribute to elevation of blood sugars, such as stress, illness, injury,and food choices. Discussed the role that physical activity plays in lowering blood sugar. Educate patient on the three main macronutrients. Protein, fats, and carbohydrates. Discussed how each of these macronutrients affect blood sugar levels, especially carbohydrate, and the importance of eating a consistent amount of carbohydrate throughout the day. Educated patient on carbohydrate counting, 15g of carbohydrate equals one carb choice. Advised patient on the importance of consistently checking their blood sugar, and recognizing how lifestyle and food choices affect those numbers. Educated patient on the "Rule of 15". Treat low blood sugar, below 70 mg/dL, by eating 15g of fast acting carbohydrate and rechecking blood sugar after 15 minutes. If blood sugar is still low, repeat. Discussed the role of diabetes medications in getting blood sugar under control, and considerations that may need to be taken to avoid complications.   Handouts Provided Include   Yellow Meal Card  Balanced Plate  Snack Sheet  Carb Counting   Learning Style & Readiness for Change Teaching method utilized: Visual & Auditory  Demonstrated degree  of understanding via: Teach Back  Barriers to learning/adherence to lifestyle change: none  Goals Established by Pt  Consider checking your blood sugar 2 hours after you start eating a meal to see how well your body is processing the food you are eating.  Look for numbers under 180 mg/dL.  If you experience symptoms of low blood sugar, quickly consume 4 oz of fruit juice or soda, or 3-4 pieces of hard candy to get it back up.  Choose more whole grain options for your carb choices. Try to work up to making 50% of your grains whole grains.  Eat consistent carbs at each meal!!!  MONITORING & EVALUATION Dietary intake, weekly physical activity, and fasting blood sugar PRN.  Next Steps  Patient is to follow up with RDN.

## 2021-06-15 NOTE — Progress Notes (Signed)
DUE TO COVID-19 ONLY ONE VISITOR IS ALLOWED TO COME WITH YOU AND STAY IN THE WAITING ROOM ONLY DURING PRE OP AND PROCEDURE DAY OF SURGERY.  2 VISITOR  MAY VISIT WITH YOU AFTER SURGERY IN YOUR PRIVATE ROOM DURING VISITING HOURS ONLY!  YOU NEED TO HAVE A COVID 19 TEST ON__8/02/2021 ____'@_'$  '@_from'$  8am-3pm _____, THIS TEST MUST BE DONE BEFORE SURGERY,  Covid test is done at [place], [place] [address].  This is a drive thru.  No appt required. Please see map.                 Your procedure is scheduled on:         06/21/21   Report to [facility] Main  Entrance   Report to admitting at    [place] am      Call this number if you have problems the morning of surgery [phone]    REMEMBER: NO  SOLID FOOD CANDY OR GUM AFTER MIDNIGHT. CLEAR LIQUIDS UNTIL   0415am         . NOTHING BY MOUTH EXCEPT CLEAR LIQUIDS UNTIL   0415am    . PLEASE FINISH ENSURE DRINK PER SURGEON ORDER  WHICH NEEDS TO BE COMPLETED AT  0415am     .      CLEAR LIQUID DIET   Foods Allowed                                                                    Coffee and tea, regular and decaf                            Fruit ices (not with fruit pulp)                                      Iced Popsicles                                    Carbonated beverages, regular and diet                                    Cranberry, grape and apple juices Sports drinks like Gatorade Lightly seasoned clear broth or consume(fat free) Sugar, honey syrup ___________________________________________________________________      BRUSH YOUR TEETH MORNING OF SURGERY AND RINSE YOUR MOUTH OUT, NO CHEWING GUM CANDY OR MINTS.     Take these medicines the morning of surgery with A SIP OF WATER:  Prilosec, cymbalta, synthroid, zyrtec   DO NOT TAKE ANY DIABETIC MEDICATIONS DAY OF YOUR SURGERY                               You may not have any metal on your body including hair pins and              piercings  Do not wear  jewelry, make-up, lotions, powders or perfumes, deodorant  Do not wear nail polish on your fingernails.  Do not shave  48 hours prior to surgery.              Men may shave face and neck.   Do not bring valuables to the hospital. [place].  Contacts, dentures or bridgework may not be worn into surgery.  Leave suitcase in the car. After surgery it may be brought to your room.     Patients discharged the day of surgery will not be allowed to drive home. IF YOU ARE HAVING SURGERY AND GOING HOME THE SAME DAY, YOU MUST HAVE AN ADULT TO DRIVE YOU HOME AND BE WITH YOU FOR 24 HOURS. YOU MAY GO HOME BY TAXI OR UBER OR ORTHERWISE, BUT AN ADULT MUST ACCOMPANY YOU HOME AND STAY WITH YOU FOR 24 HOURS.  Name and phone number of your driver:  Special Instructions: N/A              Please read over the following fact sheets you were given: _____________________________________________________________________  [facility] - Preparing for Surgery Before surgery, you can play an important role.  Because skin is not sterile, your skin needs to be as free of germs as possible.  You can reduce the number of germs on your skin by washing with CHG (chlorahexidine gluconate) soap before surgery.  CHG is an antiseptic cleaner which kills germs and bonds with the skin to continue killing germs even after washing. Please DO NOT use if you have an allergy to CHG or antibacterial soaps.  If your skin becomes reddened/irritated stop using the CHG and inform your nurse when you arrive at Short Stay. Do not shave (including legs and underarms) for at least 48 hours prior to the first CHG shower.  You may shave your face/neck. Please follow these instructions carefully:  1.  Shower with CHG Soap the night before surgery and the  morning of Surgery.  2.  If you choose to wash your hair, wash your hair first as usual with your  normal  shampoo.  3.  After you shampoo,  rinse your hair and body thoroughly to remove the  shampoo.                           4.  Use CHG as you would any other liquid soap.  You can apply chg directly  to the skin and wash                       Gently with a scrungie or clean washcloth.  5.  Apply the CHG Soap to your body ONLY FROM THE NECK DOWN.   Do not use on face/ open                           Wound or open sores. Avoid contact with eyes, ears mouth and genitals (private parts).                       Wash face,  Genitals (private parts) with your normal soap.             6.  Wash thoroughly, paying special attention to the area where your surgery  will be performed.  7.  Thoroughly rinse your body with  warm water from the neck down.  8.  DO NOT shower/wash with your normal soap after using and rinsing off  the CHG Soap.                9.  Pat yourself dry with a clean towel.            10.  Wear clean pajamas.            11.  Place clean sheets on your bed the night of your first shower and do not  sleep with pets. Day of Surgery : Do not apply any lotions/deodorants the morning of surgery.  Please wear clean clothes to the hospital/surgery center.  FAILURE TO FOLLOW THESE INSTRUCTIONS MAY RESULT IN THE CANCELLATION OF YOUR SURGERY PATIENT SIGNATURE_________________________________  NURSE SIGNATURE__________________________________  ________________________________________________________________________

## 2021-06-17 ENCOUNTER — Other Ambulatory Visit: Payer: Self-pay | Admitting: Orthopedic Surgery

## 2021-06-17 ENCOUNTER — Other Ambulatory Visit: Payer: Self-pay

## 2021-06-17 ENCOUNTER — Encounter (HOSPITAL_COMMUNITY)
Admission: RE | Admit: 2021-06-17 | Discharge: 2021-06-17 | Disposition: A | Discharge status: Home / Self Care | Payer: 59 | Source: Ambulatory Visit | Attending: Orthopedic Surgery | Admitting: Orthopedic Surgery

## 2021-06-17 ENCOUNTER — Encounter (HOSPITAL_COMMUNITY): Payer: Self-pay

## 2021-06-17 DIAGNOSIS — Z01818 Encounter for other preprocedural examination: Secondary | ICD-10-CM | POA: Insufficient documentation

## 2021-06-17 HISTORY — DX: Other complications of anesthesia, initial encounter: T88.59XA

## 2021-06-17 HISTORY — DX: Fibromyalgia: M79.7

## 2021-06-17 HISTORY — DX: Personal history of urinary calculi: Z87.442

## 2021-06-17 HISTORY — DX: Sleep apnea, unspecified: G47.30

## 2021-06-17 LAB — PROTIME-INR
INR: 0.9 (ref 0.8–1.2)
Prothrombin Time: 12.3 seconds (ref 11.4–15.2)

## 2021-06-17 LAB — CBC
HCT: 46.3 % — ABNORMAL HIGH (ref 36.0–46.0)
Hemoglobin: 14.9 g/dL (ref 12.0–15.0)
MCH: 28.1 pg (ref 26.0–34.0)
MCHC: 32.2 g/dL (ref 30.0–36.0)
MCV: 87.4 fL (ref 80.0–100.0)
Platelets: 298 10*3/uL (ref 150–400)
RBC: 5.3 MIL/uL — ABNORMAL HIGH (ref 3.87–5.11)
RDW: 14.4 % (ref 11.5–15.5)
WBC: 13.1 10*3/uL — ABNORMAL HIGH (ref 4.0–10.5)
nRBC: 0 % (ref 0.0–0.2)

## 2021-06-17 LAB — COMPREHENSIVE METABOLIC PANEL
ALT: 34 U/L (ref 0–44)
AST: 25 U/L (ref 15–41)
Albumin: 4.5 g/dL (ref 3.5–5.0)
Alkaline Phosphatase: 70 U/L (ref 38–126)
Anion gap: 10 (ref 5–15)
BUN: 15 mg/dL (ref 6–20)
CO2: 28 mmol/L (ref 22–32)
Calcium: 9.9 mg/dL (ref 8.9–10.3)
Chloride: 100 mmol/L (ref 98–111)
Creatinine, Ser: 0.5 mg/dL (ref 0.44–1.00)
GFR, Estimated: >60 mL/min (ref >60)
Glucose, Bld: 117 mg/dL — ABNORMAL HIGH (ref 70–99)
Potassium: 4.5 mmol/L (ref 3.5–5.1)
Sodium: 138 mmol/L (ref 135–145)
Total Bilirubin: 0.6 mg/dL (ref 0.3–1.2)
Total Protein: 8.1 g/dL (ref 6.5–8.1)

## 2021-06-17 LAB — HEMOGLOBIN A1C
Hgb A1c MFr Bld: 5.8 % — ABNORMAL HIGH (ref 4.8–5.6)
Mean Plasma Glucose: 119.76 mg/dL

## 2021-06-17 LAB — SURGICAL PCR SCREEN
MRSA, PCR: NEGATIVE
Staphylococcus aureus: NEGATIVE

## 2021-06-17 LAB — APTT: aPTT: 30 seconds (ref 24–36)

## 2021-06-17 LAB — GLUCOSE, CAPILLARY: Glucose-Capillary: 119 mg/dL — ABNORMAL HIGH (ref 70–99)

## 2021-06-17 NOTE — Progress Notes (Addendum)
Anesthesia Review:  PCP: Ulyses Southward, NP , DR Wende Neighbors clearance dated 04/07/21 on chart  Requested LOV note. 05/14/21 on chart.   Cardiologist : none  Endocrinologist- none  Chest x-ray : EKG :06/17/21  Echo : Stress test: Cardiac Cath :  Activity level:  can do a flight of stairs without difficulty  Sleep Study/ CPAP : diagnosed with sleep apnea machine on back order per pt  Fasting Blood Sugar :      / Checks Blood Sugar -- times a day:   Blood Thinner/ Instructions /Last Dose: ASA / Instructions/ Last Dose :   DM- type 2 checks glucose once daily per pt  Hgba1c-06/17/21- 5.8 Pt reports blood pressure drops with any kind of trauma to her body.   CBC done 06/17/21 routed to DR Aluisio.

## 2021-06-18 LAB — SARS CORONAVIRUS 2 (TAT 6-24 HRS): SARS Coronavirus 2: NEGATIVE

## 2021-06-18 NOTE — Progress Notes (Signed)
Anesthesia Chart Review   Case: [id number] Date/Time: 06/21/21 0700   Procedure: TOTAL KNEE ARTHROPLASTY (Left: Knee) - 56mn   Anesthesia type: Choice   Pre-op diagnosis: Left knee osteoarthritis   Location: [facility] ROOM 09 / [facility]   Surgeons: AGaynelle Arabian MD       DISCUSSION:60 y.o. never smoker with h/o PONV, HTN, GERD, DM II (A1C 5.8 06/17/2021), CAD, left knee OA scheduled for above procedure   Evaluated by cardiology in the past, negative stress test 02/24/2017.  Advised to follow up PRN at that time.    Clearance received from PCP which states pt is moderate risk for planned procedure due to obesity.  VS: BP 134/84   Pulse 86   Temp 36.9 C (Oral)   Resp 16   Ht 5' 5.5" (1.664 m)   Wt 109.8 kg   LMP 03/27/2014 Comment: irregular  SpO2 98%   BMI 39.66 kg/m   PROVIDERS: HCelene Squibb MD   LABS: Labs reviewed: Acceptable for surgery. (all labs ordered are listed, but only abnormal results are displayed)  Labs Reviewed  HEMOGLOBIN A1C - Abnormal; Notable for the following components:      Result Value   Hgb A1c MFr Bld 5.8 (*)    All other components within normal limits  CBC - Abnormal; Notable for the following components:   WBC 13.1 (*)    RBC 5.30 (*)    HCT 46.3 (*)    All other components within normal limits  COMPREHENSIVE METABOLIC PANEL - Abnormal; Notable for the following components:   Glucose, Bld 117 (*)    All other components within normal limits  GLUCOSE, CAPILLARY - Abnormal; Notable for the following components:   Glucose-Capillary 119 (*)    All other components within normal limits  SURGICAL PCR SCREEN  PROTIME-INR  APTT  TYPE AND SCREEN     IMAGES:   EKG: 06/17/2021 Rate 88 bpm  Normal sinus rhythm Nonspecific T wave abnormality Abnormal ECG No significant change since last tracing  CV: Stress Test 02/24/2017 The study is normal. No myocardial ischemia or scar. This is a low risk study. Nuclear stress EF: 76%. Diffuse  nonspecific ST segment abnormalities throughout study. Past Medical History:  Diagnosis Date   Anxiety    Arthritis    Complication of anesthesia    Coronary artery disease    Depression    Diabetes mellitus without complication (HCC)    Diverticulitis    Fibromyalgia    GERD (gastroesophageal reflux disease)    History of kidney stones    Hypertension    Hypothyroidism    IBS (irritable bowel syndrome)    Pneumonia    PONV (postoperative nausea and vomiting)    Sleep apnea    Thyroid disease     Past Surgical History:  Procedure Laterality Date   AGILE CAPSULE N/A 11/23/2016   Procedure: AGILE CAPSULE;  Surgeon: NRogene Houston MD;  [facility];  Service: Endoscopy;  Laterality: N/A;  8:30   BREAST EXCISIONAL BIOPSY Right    CARPAL TUNNEL RELEASE     CESAREAN SECTION     CHOLECYSTECTOMY     COLONOSCOPY N/A 08/28/2014   Procedure: COLONOSCOPY;  Surgeon: NRogene Houston MD;  [facility];  Service: Endoscopy;  Laterality: N/A;  1030   CYST REMOVAL HAND     ESOPHAGOGASTRODUODENOSCOPY N/A 03/19/2016   Procedure: ESOPHAGOGASTRODUODENOSCOPY (EGD);  Surgeon: NRogene Houston MD;  [facility];  Service: Endoscopy;  Laterality: N/A;   FLEXIBLE SIGMOIDOSCOPY N/A 09/28/2017   Procedure: FLEXIBLE SIGMOIDOSCOPY;  Surgeon: Rogene Houston, MD;  [facility];  Service: Endoscopy;  Laterality: N/A;   FRACTURE SURGERY     rt ankle    GIVENS CAPSULE STUDY N/A 12/15/2016   Procedure: GIVENS CAPSULE STUDY;  Surgeon: Rogene Houston, MD;  [facility];  Service: Endoscopy;  Laterality: N/A;   KNEE SURGERY     meniscus repair   LAPAROSCOPIC PARTIAL COLECTOMY N/A 04/22/2019   Procedure: LAPAROSCOPIC PARTIAL SIGMOID COLECTOMY;  Surgeon: Virl Cagey, MD;  Location: [facility];  Service: General;  Laterality: N/A;   TUBAL LIGATION      MEDICATIONS:  acetaminophen (TYLENOL) 650 MG CR tablet   ALPRAZolam (XANAX) 0.5 MG tablet   Ascorbic  Acid (VITAMIN C PO)   aspirin EC 81 MG tablet   cetirizine (ZYRTEC) 10 MG tablet   Cholecalciferol (VITAMIN D) 125 MCG (5000 UT) CAPS   diclofenac (VOLTAREN) 75 MG EC tablet   diclofenac Sodium (VOLTAREN) 1 % GEL   docusate sodium (COLACE) 100 MG capsule   DULoxetine (CYMBALTA) 60 MG capsule   gabapentin (NEURONTIN) 300 MG capsule   glipiZIDE (GLUCOTROL XL) 10 MG 24 hr tablet   glucosamine-chondroitin 500-400 MG tablet   levothyroxine (SYNTHROID, LEVOTHROID) 100 MCG tablet   lisinopril-hydrochlorothiazide (ZESTORETIC) 20-12.5 MG tablet   loperamide (IMODIUM) 2 MG capsule   meclizine (ANTIVERT) 25 MG tablet   Multiple Vitamin (MULTIVITAMIN WITH MINERALS) TABS tablet   Omega-3 Fatty Acids (FISH OIL) 1000 MG CAPS   omeprazole (PRILOSEC OTC) 20 MG tablet   ondansetron (ZOFRAN ODT) 4 MG disintegrating tablet   ondansetron (ZOFRAN) 4 MG tablet   oxyCODONE (OXY IR/ROXICODONE) 5 MG immediate release tablet   promethazine (PHENERGAN) 25 MG tablet   psyllium (REGULOID) 0.52 g capsule   Semaglutide (OZEMPIC, 0.25 OR 0.5 MG/DOSE, [place])   traZODone (DESYREL) 150 MG tablet   trolamine salicylate (ASPERCREME) 10 % cream   valACYclovir (VALTREX) 1000 MG tablet   zinc gluconate 50 MG tablet   No current facility-administered medications for this encounter.    Konrad Felix, PA-C [facility]Surgical Testing [phone]

## 2021-06-18 NOTE — Anesthesia Preprocedure Evaluation (Addendum)
Anesthesia Evaluation  Patient identified by MRN, date of birth, ID band Patient awake    Reviewed: Allergy & Precautions, NPO status , Patient's Chart, lab work & pertinent test results  History of Anesthesia Complications (+) PONV and history of anesthetic complications  Airway Mallampati: II  TM Distance: >3 FB Neck ROM: Full    Dental  (+) Teeth Intact, Dental Advisory Given   Pulmonary sleep apnea ,    Pulmonary exam normal breath sounds clear to auscultation       Cardiovascular hypertension, Pt. on medications + CAD  Normal cardiovascular exam Rhythm:Regular Rate:Normal     Neuro/Psych PSYCHIATRIC DISORDERS Anxiety Depression negative neurological ROS     GI/Hepatic Neg liver ROS, GERD  Medicated,  Endo/Other  diabetes, Type 2, Oral Hypoglycemic AgentsHypothyroidism Obesity   Renal/GU negative Renal ROS     Musculoskeletal  (+) Arthritis , Osteoarthritis,  Fibromyalgia -Left knee osteoarthritis   Abdominal   Peds  Hematology negative hematology ROS (+) Plt 298k   Anesthesia Other Findings   Reproductive/Obstetrics                           Anesthesia Physical Anesthesia Plan  ASA: 3  Anesthesia Plan: Spinal   Post-op Pain Management:  Regional for Post-op pain   Induction: Intravenous  PONV Risk Score and Plan: 3 and Midazolam, Dexamethasone, Ondansetron and Propofol infusion  Airway Management Planned: Nasal Cannula and Natural Airway  Additional Equipment:   Intra-op Plan:   Post-operative Plan:   Informed Consent: I have reviewed the patients History and Physical, chart, labs and discussed the procedure including the risks, benefits and alternatives for the proposed anesthesia with the patient or authorized representative who has indicated his/her understanding and acceptance.     Dental advisory given  Plan Discussed with: CRNA  Anesthesia Plan Comments:  (See PAT note 06/17/2021, Konrad Felix, PA-C)      Anesthesia Quick Evaluation

## 2021-06-18 NOTE — H&P (Signed)
TOTAL KNEE ADMISSION H&P  Patient is being admitted for left total knee arthroplasty.  Subjective:  Chief Complaint: Left knee pain.  HPI: Kristin Bailey, 60 y.o. female has a history of pain and functional disability in the left knee due to arthritis and has failed non-surgical conservative treatments for greater than 12 weeks to include NSAID's and/or analgesics and activity modification. Onset of symptoms was gradual, starting  several  years ago with gradually worsening course since that time. The patient noted no past surgery on the left knee.  Patient currently rates pain in the left knee at 7 out of 10 with activity. Patient has worsening of pain with activity and weight bearing, pain that interferes with activities of daily living, and pain with passive range of motion. Patient has evidence of periarticular osteophytes and joint space narrowing by imaging studies. There is no active infection.  Patient Active Problem List   Diagnosis Date Noted   S/P partial colectomy 04/22/2019   Sigmoid diverticulitis 04/22/2019   Elevated lactic acid level    Obesity, Class III, BMI 40-49.9 (morbid obesity) ([place])    Gastroesophageal reflux disease    Sepsis without acute organ dysfunction ([place]) 03/02/2019   Syncope 03/02/2019   HTN (hypertension) 03/02/2019   MDD (major depressive disorder), recurrent episode, mild ([place]) 09/19/2018   Abdominal pain, left lower quadrant    Diverticulitis 09/26/2017   Hyponatremia 09/26/2017   Noninfectious gastroenteritis 11/28/2016   Enteritis 11/04/2016   Lactic acidosis 11/04/2016   Enterocolitis 03/17/2016   Gastroenteritis, infectious 03/17/2016   Leukocytosis 11/28/2015   Acute respiratory failure with hypoxia ([place]) 11/28/2015   Controlled type 2 diabetes mellitus without complication, without long-term current use of insulin ([place]) 11/28/2015   UTI (lower urinary tract infection) 11/27/2015   Colitis 11/27/2015   AKI (acute kidney injury) ([place])  11/27/2015   Depression with anxiety     Past Medical History:  Diagnosis Date   Anxiety    Arthritis    Complication of anesthesia    Coronary artery disease    Depression    Diabetes mellitus without complication ([place])    Diverticulitis    Fibromyalgia    GERD (gastroesophageal reflux disease)    History of kidney stones    Hypertension    Hypothyroidism    IBS (irritable bowel syndrome)    Pneumonia    PONV (postoperative nausea and vomiting)    Sleep apnea    Thyroid disease     Past Surgical History:  Procedure Laterality Date   AGILE CAPSULE N/A 11/23/2016   Procedure: AGILE CAPSULE;  Surgeon: Rogene Houston, MD;  [facility];  Service: Endoscopy;  Laterality: N/A;  8:30   BREAST EXCISIONAL BIOPSY Right    CARPAL TUNNEL RELEASE     CESAREAN SECTION     CHOLECYSTECTOMY     COLONOSCOPY N/A 08/28/2014   Procedure: COLONOSCOPY;  Surgeon: Rogene Houston, MD;  [facility];  Service: Endoscopy;  Laterality: N/A;  1030   CYST REMOVAL HAND     ESOPHAGOGASTRODUODENOSCOPY N/A 03/19/2016   Procedure: ESOPHAGOGASTRODUODENOSCOPY (EGD);  Surgeon: Rogene Houston, MD;  [facility];  Service: Endoscopy;  Laterality: N/A;   FLEXIBLE SIGMOIDOSCOPY N/A 09/28/2017   Procedure: FLEXIBLE SIGMOIDOSCOPY;  Surgeon: Rogene Houston, MD;  [facility];  Service: Endoscopy;  Laterality: N/A;   FRACTURE SURGERY     rt ankle    GIVENS CAPSULE STUDY N/A 12/15/2016   Procedure: GIVENS CAPSULE STUDY;  Surgeon: Rogene Houston, MD;  [facility];  Service: Endoscopy;  Laterality: N/A;   KNEE SURGERY     meniscus repair   LAPAROSCOPIC PARTIAL COLECTOMY N/A 04/22/2019   Procedure: LAPAROSCOPIC PARTIAL SIGMOID COLECTOMY;  Surgeon: Virl Cagey, MD;  Location: [facility];  Service: General;  Laterality: N/A;   TUBAL LIGATION      Prior to Admission medications   Medication Sig Start Date End Date Taking? Authorizing Provider  acetaminophen  (TYLENOL) 650 MG CR tablet Take 1,300 mg by mouth See admin instructions. Take 2 tablets daily. Take 2 tablets once a day as needed for mild pain.   Yes [provider]  ALPRAZolam Duanne Moron) 0.5 MG tablet Take 1 mg by mouth at bedtime. Take 2 tablets by mouth every evening 02/03/21  Yes [provider]  Ascorbic Acid (VITAMIN C PO) Take 2,000 mg by mouth daily.   Yes [provider]  aspirin EC 81 MG tablet Take 81 mg by mouth daily.   Yes [provider]  cetirizine (ZYRTEC) 10 MG tablet Take 10 mg by mouth daily.   Yes [provider]  Cholecalciferol (VITAMIN D) 125 MCG (5000 UT) CAPS Take 5,000 Units by mouth daily.   Yes [provider]  diclofenac (VOLTAREN) 75 MG EC tablet Take 75 mg by mouth 2 (two) times daily. 02/01/21  Yes [provider]  diclofenac Sodium (VOLTAREN) 1 % GEL Apply 1 application topically 4 (four) times daily as needed (pain).   Yes [provider]  docusate sodium (COLACE) 100 MG capsule Take 1 capsule (100 mg total) by mouth 2 (two) times daily. Patient taking differently: Take 300 mg by mouth every evening. 04/26/19  Yes Virl Cagey, MD  DULoxetine (CYMBALTA) 60 MG capsule Take 1 capsule (60 mg total) by mouth 2 (two) times daily. 03/13/19  Yes Hisada, Elie Goody, MD  gabapentin (NEURONTIN) 300 MG capsule Take 300 mg by mouth at bedtime. 01/17/21  Yes [provider]  glipiZIDE (GLUCOTROL XL) 10 MG 24 hr tablet Take 10 mg by mouth daily. 12/09/20  Yes [provider]  glucosamine-chondroitin 500-400 MG tablet Take 1 tablet by mouth 2 (two) times a day.   Yes [provider]  levothyroxine (SYNTHROID, LEVOTHROID) 100 MCG tablet Take 100 mcg by mouth daily before breakfast.  08/16/17  Yes [provider]  lisinopril-hydrochlorothiazide (ZESTORETIC) 20-12.5 MG tablet Take 0.5 tablets by mouth daily.   Yes [provider]  loperamide (IMODIUM) 2 MG capsule Take 1  capsule (2 mg total) by mouth 4 (four) times daily as needed for diarrhea or loose stools. 06/09/20  Yes Hayden Rasmussen, MD  Multiple Vitamin (MULTIVITAMIN WITH MINERALS) TABS tablet Take 1 tablet by mouth daily.   Yes [provider]  Omega-3 Fatty Acids (FISH OIL) 1000 MG CAPS Take 1,000 mg by mouth daily.   Yes [provider]  omeprazole (PRILOSEC OTC) 20 MG tablet Take 1 tablet (20 mg total) by mouth daily. Patient taking differently: Take 20 mg by mouth every other day. 01/03/18  Yes Rehman, Mechele Dawley, MD  ondansetron (ZOFRAN) 4 MG tablet Take 1 tablet (4 mg total) by mouth every 8 (eight) hours as needed for nausea or vomiting. 06/09/20  Yes Hayden Rasmussen, MD  Semaglutide (OZEMPIC, 0.25 OR 0.5 MG/DOSE, [place]) Inject 0.5 mg into the skin every Thursday.   Yes [provider]  trolamine salicylate (ASPERCREME) 10 % cream Apply 1 application topically 2 (two) times  daily as needed for muscle pain.   Yes [provider]  valACYclovir (VALTREX) 1000 MG tablet Take 2,000 mg by mouth See admin instructions. As needed for fever blisters, take 2 tablets twice a day for two doses.   Yes [provider]  zinc gluconate 50 MG tablet Take 50 mg by mouth daily.   Yes [provider]  meclizine (ANTIVERT) 25 MG tablet Take 1 tablet (25 mg total) by mouth 3 (three) times daily as needed for dizziness. Patient not taking: Reported on 06/11/2021 07/11/18   Julianne Rice, MD  ondansetron (ZOFRAN ODT) 4 MG disintegrating tablet Take 1 tablet (4 mg total) by mouth every 8 (eight) hours as needed for nausea or vomiting. Patient not taking: No sig reported 02/22/21   Corena Herter, PA-C  oxyCODONE (OXY IR/ROXICODONE) 5 MG immediate release tablet Take 1-2 tablets (5-10 mg total) by mouth every 4 (four) hours as needed for moderate pain. Patient not taking: No sig reported 04/26/19   Virl Cagey, MD  promethazine (PHENERGAN) 25 MG tablet Take 1 tablet (25  mg total) by mouth every 6 (six) hours as needed for nausea or vomiting. Patient not taking: Reported on 06/11/2021 03/22/18   Ashley Murrain, NP  psyllium (REGULOID) 0.52 g capsule Take 2 capsules (1.04 g total) by mouth daily as needed (constipation). Patient not taking: No sig reported 03/03/19   Barton Dubois, MD  traZODone (DESYREL) 150 MG tablet 150-225 mg at night Patient not taking: No sig reported 09/22/19   Norman Clay, MD    No Known Allergies  Social History   Socioeconomic History   Marital status: Married    Spouse name: Not on file   Number of children: Not on file   Years of education: Not on file   Highest education level: Not on file  Occupational History   Not on file  Tobacco Use   Smoking status: Never   Smokeless tobacco: Never  Vaping Use   Vaping Use: Never used  Substance and Sexual Activity   Alcohol use: No   Drug use: No   Sexual activity: Yes    Birth control/protection: Surgical  Other Topics Concern   Not on file  Social History Narrative   Works with special needs adults.   Social Determinants of Health   Financial Resource Strain: Not on file  Food Insecurity: Not on file  Transportation Needs: Not on file  Physical Activity: Not on file  Stress: Not on file  Social Connections: Not on file  Intimate Partner Violence: Not on file    Tobacco Use: Low Risk    Smoking Tobacco Use: Never   Smokeless Tobacco Use: Never   Social History   Substance and Sexual Activity  Alcohol Use No    Family History  Problem Relation Age of Onset   Cancer Mother        colon cancer age74   Hypertension Mother    Stroke Mother    Diabetes Mother    Non-Hodgkin's lymphoma Father    Hypertension Father    Diabetes Father    Stroke Maternal Grandmother    Kidney disease Paternal Aunt    Cancer Paternal Aunt    Kidney disease Paternal Uncle    Cancer Paternal Uncle     ROS: Constitutional: no fever, no chills, no night sweats, no  significant weight loss Cardiovascular: no chest pain, no palpitations Respiratory: no cough, no shortness of breath, No COPD Gastrointestinal: no vomiting, no  nausea Musculoskeletal: no swelling in Joints, Joint Pain Neurologic: no numbness, no tingling, no difficulty with balance   Objective:  Physical Exam: Well nourished and well developed.  General: Alert and oriented x3, cooperative and pleasant, no acute distress.  Head: normocephalic, atraumatic, neck supple.  Eyes: EOMI.  Respiratory: breath sounds clear in all fields, no wheezing, rales, or rhonchi. Cardiovascular: Regular rate and rhythm, no murmurs, gallops or rubs.  Abdomen: non-tender to palpation and soft, normoactive bowel sounds. Musculoskeletal:  Bilateral Hip Exam:  The range of motion: normal without discomfort.    Right Knee Exam:  No effusion present. No swelling present.  The range of motion is: 5 to 120 degrees.  Marked crepitus on range of motion of the knee.  Positive medial greater than lateral joint line tenderness.  The knee is stable.    Left Knee Exam:  No effusion present. No swelling present.  The Range of motion is: 5 to 120 degrees.  Marked crepitus on range.  Positive medial greater than lateral joint line tenderness  The knee is stable.    The patient's sensation and motor function are intact in their lower extremities. Their distal pulses are 2+. The bilateral calves are soft and non-tender.   Vital signs in last 24 hours: Temp:  [98.4 F (36.9 C)] 98.4 F (36.9 C) (08/04 1139) Pulse Rate:  [86] 86 (08/04 1139) Resp:  [16] 16 (08/04 1139) BP: (134)/(84) 134/84 (08/04 1139) SpO2:  [98 %] 98 % (08/04 1139) Weight:  [109.8 kg] 109.8 kg (08/04 1139)  Imaging Review AP and lateral of the bilateral knees dated 03/18/2021 demonstrate bone-on-bone arthritis in the medial and patellofemoral compartments of the bilateral knees.   Assessment/Plan:  End stage arthritis, left knee   The  patient history, physical examination, clinical judgment of the provider and imaging studies are consistent with end stage degenerative joint disease of the left knee and total knee arthroplasty is deemed medically necessary. The treatment options including medical management, injection therapy arthroscopy and arthroplasty were discussed at length. The risks and benefits of total knee arthroplasty were presented and reviewed. The risks due to aseptic loosening, infection, stiffness, patella tracking problems, thromboembolic complications and other imponderables were discussed. The patient acknowledged the explanation, agreed to proceed with the plan and consent was signed. Patient is being admitted for inpatient treatment for surgery, pain control, PT, OT, prophylactic antibiotics, VTE prophylaxis, progressive ambulation and ADLs and discharge planning. The patient is planning to be discharged  home .   Patient's anticipated LOS is less than 2 midnights, meeting these requirements: - Younger than 19 - Lives within 1 hour of care - Has a competent adult at home to recover with post-op  - NO history of  - Chronic pain requiring opioids  - Diabetes  - Coronary Artery Disease  - Heart failure  - Heart attack  - Stroke  - DVT/VTE  - Cardiac arrhythmia  - Respiratory Failure/COPD  - Renal failure  - Anemia  - Advanced Liver disease    Therapy Plans: [facility] in [place] Disposition: Home with husband Planned DVT Prophylaxis: Aspirin '325mg'$  DME Needed: [facility] PCP: Delphina Cahill, MD (clearance received) TXA: IV Allergies: None Anesthesia Concerns: Recent diagnosis of Sleep Apnea BMI: 39.7 Last HgbA1c: 6.2-6.3  Pharmacy: [facility] in [place]   - Patient was instructed on what medications to stop prior to surgery. - Follow-up visit in 2 weeks with Dr. Wynelle Link - Begin physical therapy following surgery - Pre-operative lab work as pre-surgical  testing - Prescriptions will be provided in hospital  at time of discharge  Fenton Foy, [facility], PA-C Orthopedic Surgery [facility] [place]

## 2021-06-20 MED ORDER — BUPIVACAINE LIPOSOME 1.3 % IJ SUSP
20.0000 mL | INTRAMUSCULAR | Status: DC
  Filled 2021-06-20: qty 20

## 2021-06-21 ENCOUNTER — Ambulatory Visit (HOSPITAL_COMMUNITY)
Admission: RE | Admit: 2021-06-21 | Discharge: 2021-06-22 | Disposition: A | Discharge status: Home / Self Care | Payer: 59 | Attending: Orthopedic Surgery | Admitting: Orthopedic Surgery

## 2021-06-21 ENCOUNTER — Ambulatory Visit (HOSPITAL_COMMUNITY): Payer: 59 | Admitting: Physician Assistant

## 2021-06-21 ENCOUNTER — Encounter (HOSPITAL_COMMUNITY): Payer: Self-pay | Admitting: Orthopedic Surgery

## 2021-06-21 ENCOUNTER — Encounter (HOSPITAL_COMMUNITY)
Admission: RE | Disposition: A | Discharge status: Home / Self Care | Payer: Self-pay | Source: Home / Self Care | Attending: Orthopedic Surgery

## 2021-06-21 ENCOUNTER — Ambulatory Visit (HOSPITAL_COMMUNITY): Payer: 59 | Admitting: Anesthesiology

## 2021-06-21 ENCOUNTER — Other Ambulatory Visit: Payer: Self-pay

## 2021-06-21 DIAGNOSIS — G4730 Sleep apnea, unspecified: Secondary | ICD-10-CM | POA: Diagnosis not present

## 2021-06-21 DIAGNOSIS — Z79890 Hormone replacement therapy (postmenopausal): Secondary | ICD-10-CM | POA: Insufficient documentation

## 2021-06-21 DIAGNOSIS — M1712 Unilateral primary osteoarthritis, left knee: Secondary | ICD-10-CM | POA: Diagnosis not present

## 2021-06-21 DIAGNOSIS — Z6839 Body mass index (BMI) 39.0-39.9, adult: Secondary | ICD-10-CM | POA: Insufficient documentation

## 2021-06-21 DIAGNOSIS — E669 Obesity, unspecified: Secondary | ICD-10-CM | POA: Diagnosis not present

## 2021-06-21 DIAGNOSIS — Z794 Long term (current) use of insulin: Secondary | ICD-10-CM | POA: Insufficient documentation

## 2021-06-21 DIAGNOSIS — Z7982 Long term (current) use of aspirin: Secondary | ICD-10-CM | POA: Insufficient documentation

## 2021-06-21 DIAGNOSIS — M1710 Unilateral primary osteoarthritis, unspecified knee: Secondary | ICD-10-CM | POA: Diagnosis present

## 2021-06-21 DIAGNOSIS — E039 Hypothyroidism, unspecified: Secondary | ICD-10-CM | POA: Insufficient documentation

## 2021-06-21 DIAGNOSIS — M179 Osteoarthritis of knee, unspecified: Secondary | ICD-10-CM

## 2021-06-21 DIAGNOSIS — Z79899 Other long term (current) drug therapy: Secondary | ICD-10-CM | POA: Insufficient documentation

## 2021-06-21 HISTORY — PX: TOTAL KNEE ARTHROPLASTY: SHX125

## 2021-06-21 LAB — GLUCOSE, CAPILLARY
Glucose-Capillary: 108 mg/dL — ABNORMAL HIGH (ref 70–99)
Glucose-Capillary: 94 mg/dL (ref 70–99)
Glucose-Capillary: 133 mg/dL — ABNORMAL HIGH (ref 70–99)
Glucose-Capillary: 160 mg/dL — ABNORMAL HIGH (ref 70–99)
Glucose-Capillary: 136 mg/dL — ABNORMAL HIGH (ref 70–99)

## 2021-06-21 LAB — TYPE AND SCREEN
ABO/RH(D): O POS
Antibody Screen: NEGATIVE

## 2021-06-21 SURGERY — ARTHROPLASTY, KNEE, TOTAL
Anesthesia: Spinal | Site: Knee | Laterality: Left

## 2021-06-21 MED ORDER — ONDANSETRON HCL 4 MG/2ML IJ SOLN
INTRAMUSCULAR | Status: AC
  Filled 2021-06-21: qty 2

## 2021-06-21 MED ORDER — FENTANYL CITRATE (PF) 100 MCG/2ML IJ SOLN: 25.0000 ug | INTRAMUSCULAR | Status: DC | PRN

## 2021-06-21 MED ORDER — SODIUM CHLORIDE (PF) 0.9 % IJ SOLN
INTRAMUSCULAR | Status: AC
  Filled 2021-06-21: qty 30

## 2021-06-21 MED ORDER — FENTANYL CITRATE (PF) 100 MCG/2ML IJ SOLN
INTRAMUSCULAR | Status: AC
  Filled 2021-06-21: qty 2

## 2021-06-21 MED ORDER — PROPOFOL 500 MG/50ML IV EMUL
INTRAVENOUS | Status: DC | PRN
  Administered 2021-06-21: 80 ug/kg/min via INTRAVENOUS

## 2021-06-21 MED ORDER — SODIUM CHLORIDE (PF) 0.9 % IJ SOLN
INTRAMUSCULAR | Status: DC | PRN
  Administered 2021-06-21: 60 mL

## 2021-06-21 MED ORDER — ONDANSETRON HCL 4 MG/2ML IJ SOLN
INTRAMUSCULAR | Status: DC | PRN
  Administered 2021-06-21: 4 mg via INTRAVENOUS

## 2021-06-21 MED ORDER — LIDOCAINE 2% (20 MG/ML) 5 ML SYRINGE
INTRAMUSCULAR | Status: AC
  Filled 2021-06-21: qty 5

## 2021-06-21 MED ORDER — FENTANYL CITRATE (PF) 100 MCG/2ML IJ SOLN
INTRAMUSCULAR | Status: DC | PRN
  Administered 2021-06-21 (×2): 50 ug via INTRAVENOUS

## 2021-06-21 MED ORDER — CEFAZOLIN SODIUM-DEXTROSE 2-4 GM/100ML-% IV SOLN
2.0000 g | Freq: Four times a day (QID) | INTRAVENOUS | Status: AC
  Administered 2021-06-21 (×2): 2 g via INTRAVENOUS
  Filled 2021-06-21 (×2): qty 100

## 2021-06-21 MED ORDER — BISACODYL 10 MG RE SUPP: 10.0000 mg | Freq: Every day | RECTAL | Status: DC | PRN

## 2021-06-21 MED ORDER — SODIUM CHLORIDE 0.9 % IR SOLN
Status: DC | PRN
  Administered 2021-06-21: 1000 mL

## 2021-06-21 MED ORDER — INSULIN ASPART 100 UNIT/ML IJ SOLN
0.0000 [IU] | Freq: Three times a day (TID) | INTRAMUSCULAR | Status: DC
  Administered 2021-06-21: 2 [IU] via SUBCUTANEOUS
  Administered 2021-06-21: 3 [IU] via SUBCUTANEOUS
  Administered 2021-06-22: 2 [IU] via SUBCUTANEOUS

## 2021-06-21 MED ORDER — PHENOL 1.4 % MT LIQD: 1.0000 | OROMUCOSAL | Status: DC | PRN

## 2021-06-21 MED ORDER — POVIDONE-IODINE 10 % EX SWAB
2.0000 "application " | Freq: Once | CUTANEOUS | Status: AC
  Administered 2021-06-21: 2 via TOPICAL

## 2021-06-21 MED ORDER — ONDANSETRON HCL 4 MG PO TABS: 4.0000 mg | ORAL_TABLET | Freq: Four times a day (QID) | ORAL | Status: DC | PRN

## 2021-06-21 MED ORDER — CEFAZOLIN SODIUM-DEXTROSE 2-4 GM/100ML-% IV SOLN
2.0000 g | INTRAVENOUS | Status: AC
  Administered 2021-06-21: 2 g via INTRAVENOUS
  Filled 2021-06-21: qty 100

## 2021-06-21 MED ORDER — MENTHOL 3 MG MT LOZG: 1.0000 | LOZENGE | OROMUCOSAL | Status: DC | PRN

## 2021-06-21 MED ORDER — LACTATED RINGERS IV SOLN: INTRAVENOUS | Status: DC

## 2021-06-21 MED ORDER — DEXAMETHASONE SODIUM PHOSPHATE 10 MG/ML IJ SOLN
INTRAMUSCULAR | Status: AC
  Filled 2021-06-21: qty 1

## 2021-06-21 MED ORDER — DULOXETINE HCL 60 MG PO CPEP
60.0000 mg | ORAL_CAPSULE | Freq: Two times a day (BID) | ORAL | Status: DC
  Administered 2021-06-21 – 2021-06-22 (×2): 60 mg via ORAL
  Filled 2021-06-21 (×2): qty 1

## 2021-06-21 MED ORDER — PROMETHAZINE HCL 25 MG/ML IJ SOLN: 6.2500 mg | INTRAMUSCULAR | Status: DC | PRN

## 2021-06-21 MED ORDER — CHLORHEXIDINE GLUCONATE 0.12 % MT SOLN
15.0000 mL | Freq: Once | OROMUCOSAL | Status: AC
  Administered 2021-06-21: 15 mL via OROMUCOSAL

## 2021-06-21 MED ORDER — ORAL CARE MOUTH RINSE: 15.0000 mL | Freq: Once | OROMUCOSAL | Status: AC

## 2021-06-21 MED ORDER — LEVOTHYROXINE SODIUM 100 MCG PO TABS
100.0000 ug | ORAL_TABLET | Freq: Every day | ORAL | Status: DC
  Administered 2021-06-22: 100 ug via ORAL
  Filled 2021-06-21: qty 1

## 2021-06-21 MED ORDER — CHLORHEXIDINE GLUCONATE CLOTH 2 % EX PADS: 6.0000 | MEDICATED_PAD | Freq: Every day | CUTANEOUS | Status: DC

## 2021-06-21 MED ORDER — MIDAZOLAM HCL 2 MG/2ML IJ SOLN
INTRAMUSCULAR | Status: AC
  Filled 2021-06-21: qty 2

## 2021-06-21 MED ORDER — ASPIRIN EC 325 MG PO TBEC
325.0000 mg | DELAYED_RELEASE_TABLET | Freq: Two times a day (BID) | ORAL | Status: DC
  Administered 2021-06-22: 325 mg via ORAL
  Filled 2021-06-21: qty 1

## 2021-06-21 MED ORDER — DEXAMETHASONE SODIUM PHOSPHATE 10 MG/ML IJ SOLN
INTRAMUSCULAR | Status: DC | PRN
  Administered 2021-06-21: 10 mg

## 2021-06-21 MED ORDER — DIPHENHYDRAMINE HCL 12.5 MG/5ML PO ELIX: 12.5000 mg | ORAL_SOLUTION | ORAL | Status: DC | PRN

## 2021-06-21 MED ORDER — METHOCARBAMOL 500 MG PO TABS
500.0000 mg | ORAL_TABLET | Freq: Four times a day (QID) | ORAL | Status: DC | PRN
  Administered 2021-06-21 – 2021-06-22 (×4): 500 mg via ORAL
  Filled 2021-06-21 (×4): qty 1

## 2021-06-21 MED ORDER — LOPERAMIDE HCL 2 MG PO CAPS: 2.0000 mg | ORAL_CAPSULE | Freq: Four times a day (QID) | ORAL | Status: DC | PRN

## 2021-06-21 MED ORDER — ACETAMINOPHEN 500 MG PO TABS
1000.0000 mg | ORAL_TABLET | Freq: Four times a day (QID) | ORAL | Status: AC
  Administered 2021-06-21 – 2021-06-22 (×4): 1000 mg via ORAL
  Filled 2021-06-21 (×4): qty 2

## 2021-06-21 MED ORDER — STERILE WATER FOR IRRIGATION IR SOLN
Status: DC | PRN
  Administered 2021-06-21 (×2): 1000 mL

## 2021-06-21 MED ORDER — ALPRAZOLAM 1 MG PO TABS
1.0000 mg | ORAL_TABLET | Freq: Every day | ORAL | Status: DC
  Administered 2021-06-21: 1 mg via ORAL
  Filled 2021-06-21: qty 1

## 2021-06-21 MED ORDER — SODIUM CHLORIDE 0.9 % IV SOLN: INTRAVENOUS | Status: DC

## 2021-06-21 MED ORDER — BUPIVACAINE IN DEXTROSE 0.75-8.25 % IT SOLN
INTRATHECAL | Status: DC | PRN
  Administered 2021-06-21: 1.6 mL via INTRATHECAL

## 2021-06-21 MED ORDER — LIDOCAINE HCL (CARDIAC) PF 100 MG/5ML IV SOSY
PREFILLED_SYRINGE | INTRAVENOUS | Status: DC | PRN
  Administered 2021-06-21: 40 mg via INTRAVENOUS

## 2021-06-21 MED ORDER — OXYCODONE HCL 5 MG PO TABS
10.0000 mg | ORAL_TABLET | ORAL | Status: DC | PRN
  Administered 2021-06-21: 10 mg via ORAL
  Administered 2021-06-21: 15 mg via ORAL
  Administered 2021-06-21 – 2021-06-22 (×2): 10 mg via ORAL
  Administered 2021-06-22 (×2): 15 mg via ORAL
  Filled 2021-06-21: qty 3
  Filled 2021-06-21 (×2): qty 2
  Filled 2021-06-21 (×3): qty 3

## 2021-06-21 MED ORDER — MIDAZOLAM HCL 2 MG/2ML IJ SOLN
INTRAMUSCULAR | Status: DC | PRN
  Administered 2021-06-21: 2 mg via INTRAVENOUS

## 2021-06-21 MED ORDER — ACETAMINOPHEN 10 MG/ML IV SOLN
1000.0000 mg | Freq: Four times a day (QID) | INTRAVENOUS | Status: DC
  Administered 2021-06-21: 1000 mg via INTRAVENOUS
  Filled 2021-06-21: qty 100

## 2021-06-21 MED ORDER — ONDANSETRON HCL 4 MG/2ML IJ SOLN: 4.0000 mg | Freq: Four times a day (QID) | INTRAMUSCULAR | Status: DC | PRN

## 2021-06-21 MED ORDER — OXYCODONE HCL 5 MG PO TABS: 5.0000 mg | ORAL_TABLET | ORAL | Status: DC | PRN

## 2021-06-21 MED ORDER — DEXAMETHASONE SODIUM PHOSPHATE 10 MG/ML IJ SOLN
INTRAMUSCULAR | Status: DC | PRN
  Administered 2021-06-21: 4 mg via INTRAVENOUS

## 2021-06-21 MED ORDER — POLYETHYLENE GLYCOL 3350 17 G PO PACK: 17.0000 g | PACK | Freq: Every day | ORAL | Status: DC | PRN

## 2021-06-21 MED ORDER — DOCUSATE SODIUM 100 MG PO CAPS
100.0000 mg | ORAL_CAPSULE | Freq: Two times a day (BID) | ORAL | Status: DC
  Administered 2021-06-21 – 2021-06-22 (×3): 100 mg via ORAL
  Filled 2021-06-21 (×3): qty 1

## 2021-06-21 MED ORDER — METOCLOPRAMIDE HCL 5 MG PO TABS: 5.0000 mg | ORAL_TABLET | Freq: Three times a day (TID) | ORAL | Status: DC | PRN

## 2021-06-21 MED ORDER — ROPIVACAINE HCL 5 MG/ML IJ SOLN
INTRAMUSCULAR | Status: DC | PRN
  Administered 2021-06-21: 30 mL via PERINEURAL

## 2021-06-21 MED ORDER — SODIUM CHLORIDE (PF) 0.9 % IJ SOLN
INTRAMUSCULAR | Status: AC
  Filled 2021-06-21: qty 10

## 2021-06-21 MED ORDER — BUPIVACAINE LIPOSOME 1.3 % IJ SUSP
INTRAMUSCULAR | Status: DC | PRN
  Administered 2021-06-21: 20 mL

## 2021-06-21 MED ORDER — GLIPIZIDE ER 5 MG PO TB24
10.0000 mg | ORAL_TABLET | Freq: Every day | ORAL | Status: DC
  Administered 2021-06-22: 10 mg via ORAL
  Filled 2021-06-21: qty 2

## 2021-06-21 MED ORDER — DEXAMETHASONE SODIUM PHOSPHATE 10 MG/ML IJ SOLN: 8.0000 mg | Freq: Once | INTRAMUSCULAR | Status: DC

## 2021-06-21 MED ORDER — PROPOFOL 1000 MG/100ML IV EMUL
INTRAVENOUS | Status: AC
  Filled 2021-06-21: qty 100

## 2021-06-21 MED ORDER — PROPOFOL 10 MG/ML IV BOLUS
INTRAVENOUS | Status: DC | PRN
  Administered 2021-06-21: 10 mg via INTRAVENOUS
  Administered 2021-06-21 (×2): 20 mg via INTRAVENOUS
  Administered 2021-06-21: 10 mg via INTRAVENOUS

## 2021-06-21 MED ORDER — GABAPENTIN 300 MG PO CAPS
300.0000 mg | ORAL_CAPSULE | Freq: Every day | ORAL | Status: DC
  Administered 2021-06-21: 300 mg via ORAL
  Filled 2021-06-21: qty 1

## 2021-06-21 MED ORDER — METOCLOPRAMIDE HCL 5 MG/ML IJ SOLN: 5.0000 mg | Freq: Three times a day (TID) | INTRAMUSCULAR | Status: DC | PRN

## 2021-06-21 MED ORDER — METHOCARBAMOL 500 MG IVPB - SIMPLE MED
500.0000 mg | Freq: Four times a day (QID) | INTRAVENOUS | Status: DC | PRN
  Filled 2021-06-21: qty 50

## 2021-06-21 MED ORDER — TRANEXAMIC ACID-NACL 1000-0.7 MG/100ML-% IV SOLN
1000.0000 mg | INTRAVENOUS | Status: AC
  Administered 2021-06-21: 1000 mg via INTRAVENOUS
  Filled 2021-06-21: qty 100

## 2021-06-21 MED ORDER — HYDROMORPHONE HCL 1 MG/ML IJ SOLN
0.5000 mg | INTRAMUSCULAR | Status: DC | PRN
  Administered 2021-06-21 – 2021-06-22 (×2): 1 mg via INTRAVENOUS
  Filled 2021-06-21 (×3): qty 1

## 2021-06-21 MED ORDER — DEXAMETHASONE SODIUM PHOSPHATE 10 MG/ML IJ SOLN
10.0000 mg | Freq: Once | INTRAMUSCULAR | Status: AC
  Administered 2021-06-22: 10 mg via INTRAVENOUS
  Filled 2021-06-21: qty 1

## 2021-06-21 MED ORDER — FLEET ENEMA 7-19 GM/118ML RE ENEM: 1.0000 | ENEMA | Freq: Once | RECTAL | Status: DC | PRN

## 2021-06-21 SURGICAL SUPPLY — 59 items
ATTUNE MED DOME PAT 38 KNEE (Knees) ×1 IMPLANT
ATTUNE MED DOME PAT 38MM KNEE (Knees) ×1 IMPLANT
ATTUNE PS FEM LT SZ 5 CEM KNEE (Femur) ×2 IMPLANT
ATTUNE PSRP INSR SZ5 8 KNEE (Insert) ×1 IMPLANT
ATTUNE PSRP INSR SZ5 8MM KNEE (Insert) ×1 IMPLANT
BAG COUNTER SPONGE SURGICOUNT (BAG) IMPLANT
BAG SPEC THK2 15X12 ZIP CLS (MISCELLANEOUS) ×1
BAG SPNG CNTER NS LX DISP (BAG)
BAG SURGICOUNT SPONGE COUNTING (BAG)
BAG ZIPLOCK 12X15 (MISCELLANEOUS) ×3 IMPLANT
BASE TIBIAL ROT PLAT SZ 5 KNEE (Knees) IMPLANT
BLADE SAG 18X100X1.27 (BLADE) ×3 IMPLANT
BLADE SAW SGTL 11.0X1.19X90.0M (BLADE) ×3 IMPLANT
BNDG ELASTIC 6X5.8 VLCR STR LF (GAUZE/BANDAGES/DRESSINGS) ×3 IMPLANT
BOWL SMART MIX CTS (DISPOSABLE) ×3 IMPLANT
BSPLAT TIB 5 CMNT ROT PLAT STR (Knees) ×1 IMPLANT
CEMENT HV SMART SET (Cement) ×6 IMPLANT
CLOSURE WOUND 1/2 X4 (GAUZE/BANDAGES/DRESSINGS) ×2
COVER SURGICAL LIGHT HANDLE (MISCELLANEOUS) ×3 IMPLANT
CUFF TOURN SGL QUICK 34 (TOURNIQUET CUFF) ×3
CUFF TRNQT CYL 34X4.125X (TOURNIQUET CUFF) ×1 IMPLANT
DECANTER SPIKE VIAL GLASS SM (MISCELLANEOUS) ×3 IMPLANT
DRAPE U-SHAPE 47X51 STRL (DRAPES) ×3 IMPLANT
DRSG AQUACEL AG ADV 3.5X10 (GAUZE/BANDAGES/DRESSINGS) ×3 IMPLANT
DURAPREP 26ML APPLICATOR (WOUND CARE) ×3 IMPLANT
ELECT REM PT RETURN 15FT ADLT (MISCELLANEOUS) ×3 IMPLANT
GLOVE SRG 8 PF TXTR STRL LF DI (GLOVE) ×1 IMPLANT
GLOVE SURG ENC MOIS LTX SZ6.5 (GLOVE) ×3 IMPLANT
GLOVE SURG ENC MOIS LTX SZ8 (GLOVE) ×6 IMPLANT
GLOVE SURG UNDER POLY LF SZ7 (GLOVE) ×3 IMPLANT
GLOVE SURG UNDER POLY LF SZ8 (GLOVE) ×3
GLOVE SURG UNDER POLY LF SZ8.5 (GLOVE) ×3 IMPLANT
GOWN STRL REUS W/TWL LRG LVL3 (GOWN DISPOSABLE) ×6 IMPLANT
GOWN STRL REUS W/TWL XL LVL3 (GOWN DISPOSABLE) ×3 IMPLANT
HANDPIECE INTERPULSE COAX TIP (DISPOSABLE) ×3
HOLDER FOLEY CATH W/STRAP (MISCELLANEOUS) IMPLANT
IMMOBILIZER KNEE 20 (SOFTGOODS) ×3
IMMOBILIZER KNEE 20 THIGH 36 (SOFTGOODS) ×1 IMPLANT
KIT TURNOVER KIT A (KITS) ×3 IMPLANT
MANIFOLD NEPTUNE II (INSTRUMENTS) ×3 IMPLANT
NS IRRIG 1000ML POUR BTL (IV SOLUTION) ×3 IMPLANT
PACK TOTAL KNEE CUSTOM (KITS) ×3 IMPLANT
PADDING CAST COTTON 6X4 STRL (CAST SUPPLIES) ×6 IMPLANT
PENCIL SMOKE EVACUATOR (MISCELLANEOUS) ×3 IMPLANT
PIN DRILL FIX HALF THREAD (BIT) ×2 IMPLANT
PIN STEINMAN FIXATION KNEE (PIN) ×2 IMPLANT
PROTECTOR NERVE ULNAR (MISCELLANEOUS) ×3 IMPLANT
SET HNDPC FAN SPRY TIP SCT (DISPOSABLE) ×1 IMPLANT
STRIP CLOSURE SKIN 1/2X4 (GAUZE/BANDAGES/DRESSINGS) ×4 IMPLANT
SUT MNCRL AB 4-0 PS2 18 (SUTURE) ×3 IMPLANT
SUT STRATAFIX 0 PDS 27 VIOLET (SUTURE) ×3
SUT VIC AB 2-0 CT1 27 (SUTURE) ×9
SUT VIC AB 2-0 CT1 TAPERPNT 27 (SUTURE) ×3 IMPLANT
SUTURE STRATFX 0 PDS 27 VIOLET (SUTURE) ×1 IMPLANT
TIBIAL BASE ROT PLAT SZ 5 KNEE (Knees) ×3 IMPLANT
TRAY FOLEY MTR SLVR 16FR STAT (SET/KITS/TRAYS/PACK) ×3 IMPLANT
TUBE SUCTION HIGH CAP CLEAR NV (SUCTIONS) ×3 IMPLANT
WATER STERILE IRR 1000ML POUR (IV SOLUTION) ×6 IMPLANT
WRAP KNEE MAXI GEL POST OP (GAUZE/BANDAGES/DRESSINGS) ×3 IMPLANT

## 2021-06-21 NOTE — Discharge Instructions (Signed)
 Frank Aluisio, MD Total Joint Specialist [facility] [address] [place], [place] [address] [phone]  TOTAL KNEE REPLACEMENT POSTOPERATIVE DIRECTIONS    Knee Rehabilitation, Guidelines Following Surgery  Results after knee surgery are often greatly improved when you follow the exercise, range of motion and muscle strengthening exercises prescribed by your doctor. Safety measures are also important to protect the knee from further injury. If any of these exercises cause you to have increased pain or swelling in your knee joint, decrease the amount until you are comfortable again and slowly increase them. If you have problems or questions, call your caregiver or physical therapist for advice.    BLOOD CLOT PREVENTION Take a 10 mg Xarelto once a day for three weeks following surgery. Then take an 81 mg Aspirin once a day for three weeks. Then discontinue Aspirin. You may resume your vitamins/supplements once you have discontinued the Xarelto. Do not take any NSAIDs (Advil, Aleve, Ibuprofen, Meloxicam, etc.) until you have discontinued the Xarelto.    HOME CARE INSTRUCTIONS  Remove items at home which could result in a fall. This includes throw rugs or furniture in walking pathways.  ICE to the affected knee as much as tolerated. Icing helps control swelling. If the swelling is well controlled you will be more comfortable and rehab easier. Continue to use ice on the knee for pain and swelling from surgery. You may notice swelling that will progress down to the foot and ankle. This is normal after surgery. Elevate the leg when you are not up walking on it.    Continue to use the breathing machine which will help keep your temperature down. It is common for your temperature to cycle up and down following surgery, especially at night when you are not up moving around and exerting yourself. The breathing machine keeps your lungs expanded and your temperature  down. Do not place pillow under the operative knee, focus on keeping the knee straight while resting  DIET You may resume your previous home diet once you are discharged from the hospital.  DRESSING / WOUND CARE / SHOWERING Keep your bulky bandage on for 2 days. On the third post-operative day you may remove the Ace bandage and gauze. There is a waterproof adhesive bandage on your skin which will stay in place until your first follow-up appointment. Once you remove this you will not need to place another bandage You may begin showering 3 days following surgery, but do not submerge the incision under water.  ACTIVITY For the first 5 days, the key is rest and control of pain and swelling Do your home exercises twice a day starting on post-operative day 3. On the days you go to physical therapy, just do the home exercises once that day. You should rest, ice and elevate the leg for 50 minutes out of every hour. Get up and walk/stretch for 10 minutes per hour. After 5 days you can increase your activity slowly as tolerated. Walk with your walker as instructed. Use the walker until you are comfortable transitioning to a cane. Walk with the cane in the opposite hand of the operative leg. You may discontinue the cane once you are comfortable and walking steadily. Avoid periods of inactivity such as sitting longer than an hour when not asleep. This helps prevent blood clots.  You may discontinue the knee immobilizer once you are able to perform a straight leg raise while lying down. You may resume a sexual relationship in one   month or when given the OK by your doctor.  You may return to work once you are cleared by your doctor.  Do not drive a car for 6 weeks or until released by your surgeon.  Do not drive while taking narcotics.  TED HOSE STOCKINGS Wear the elastic stockings on both legs for three weeks following surgery during the day. You may remove them at night for sleeping.  WEIGHT  BEARING Weight bearing as tolerated with assist device (walker, cane, etc) as directed, use it as long as suggested by your surgeon or therapist, typically at least 4-6 weeks.  POSTOPERATIVE CONSTIPATION PROTOCOL Constipation - defined medically as fewer than three stools per week and severe constipation as less than one stool per week.  One of the most common issues patients have following surgery is constipation.  Even if you have a regular bowel pattern at home, your normal regimen is likely to be disrupted due to multiple reasons following surgery.  Combination of anesthesia, postoperative narcotics, change in appetite and fluid intake all can affect your bowels.  In order to avoid complications following surgery, here are some recommendations in order to help you during your recovery period.  Colace (docusate) - Pick up an over-the-counter form of Colace or another stool softener and take twice a day as long as you are requiring postoperative pain medications.  Take with a full glass of water daily.  If you experience loose stools or diarrhea, hold the colace until you stool forms back up. If your symptoms do not get better within 1 week or if they get worse, check with your doctor. Dulcolax (bisacodyl) - Pick up over-the-counter and take as directed by the product packaging as needed to assist with the movement of your bowels.  Take with a full glass of water.  Use this product as needed if not relieved by Colace only.  MiraLax (polyethylene glycol) - Pick up over-the-counter to have on hand. MiraLax is a solution that will increase the amount of water in your bowels to assist with bowel movements.  Take as directed and can mix with a glass of water, juice, soda, coffee, or tea. Take if you go more than two days without a movement. Do not use MiraLax more than once per day. Call your doctor if you are still constipated or irregular after using this medication for 7 days in a row.  If you continue  to have problems with postoperative constipation, please contact the office for further assistance and recommendations.  If you experience "the worst abdominal pain ever" or develop nausea or vomiting, please contact the office immediatly for further recommendations for treatment.  ITCHING If you experience itching with your medications, try taking only a single pain pill, or even half a pain pill at a time.  You can also use Benadryl over the counter for itching or also to help with sleep.   MEDICATIONS See your medication summary on the "After Visit Summary" that the nursing staff will review with you prior to discharge.  You may have some home medications which will be placed on hold until you complete the course of blood thinner medication.  It is important for you to complete the blood thinner medication as prescribed by your surgeon.  Continue your approved medications as instructed at time of discharge.  PRECAUTIONS If you experience chest pain or shortness of breath - call 911 immediately for transfer to the hospital emergency department.  If you develop a fever greater that 101   F, purulent drainage from wound, increased redness or drainage from wound, foul odor from the wound/dressing, or calf pain - CONTACT YOUR SURGEON.                                                   FOLLOW-UP APPOINTMENTS Make sure you keep all of your appointments after your operation with your surgeon and caregivers. You should call the office at the above phone number and make an appointment for approximately two weeks after the date of your surgery or on the date instructed by your surgeon outlined in the "After Visit Summary".  RANGE OF MOTION AND STRENGTHENING EXERCISES  Rehabilitation of the knee is important following a knee injury or an operation. After just a few days of immobilization, the muscles of the thigh which control the knee become weakened and shrink (atrophy). Knee exercises are designed to build up  the tone and strength of the thigh muscles and to improve knee motion. Often times heat used for twenty to thirty minutes before working out will loosen up your tissues and help with improving the range of motion but do not use heat for the first two weeks following surgery. These exercises can be done on a training (exercise) mat, on the floor, on a table or on a bed. Use what ever works the best and is most comfortable for you Knee exercises include:  Leg Lifts - While your knee is still immobilized in a splint or cast, you can do straight leg raises. Lift the leg to 60 degrees, hold for 3 sec, and slowly lower the leg. Repeat 10-20 times 2-3 times daily. Perform this exercise against resistance later as your knee gets better.  Quad and Hamstring Sets - Tighten up the muscle on the front of the thigh (Quad) and hold for 5-10 sec. Repeat this 10-20 times hourly. Hamstring sets are done by pushing the foot backward against an object and holding for 5-10 sec. Repeat as with quad sets.  Leg Slides: Lying on your back, slowly slide your foot toward your buttocks, bending your knee up off the floor (only go as far as is comfortable). Then slowly slide your foot back down until your leg is flat on the floor again. Angel Wings: Lying on your back spread your legs to the side as far apart as you can without causing discomfort.  A rehabilitation program following serious knee injuries can speed recovery and prevent re-injury in the future due to weakened muscles. Contact your doctor or a physical therapist for more information on knee rehabilitation.   POST-OPERATIVE OPIOID TAPER INSTRUCTIONS: It is important to wean off of your opioid medication as soon as possible. If you do not need pain medication after your surgery it is ok to stop day one. Opioids include: Codeine, Hydrocodone(Norco, Vicodin), Oxycodone(Percocet, oxycontin) and hydromorphone amongst others.  Long term and even short term use of opiods can  cause: Increased pain response Dependence Constipation Depression Respiratory depression And more.  Withdrawal symptoms can include Flu like symptoms Nausea, vomiting And more Techniques to manage these symptoms Hydrate well Eat regular healthy meals Stay active Use relaxation techniques(deep breathing, meditating, yoga) Do Not substitute Alcohol to help with tapering If you have been on opioids for less than two weeks and do not have pain than it is ok to stop all together.    plan should start within one week post op of your joint replacement. Maintain the same interval or time between taking each dose and first decrease the dose.  Cut the total daily intake of opioids by one tablet each day Next start to increase the time between doses. The last dose that should be eliminated is the evening dose.   IF YOU ARE TRANSFERRED TO A SKILLED REHAB FACILITY If the patient is transferred to a skilled rehab facility following release from the hospital, a list of the current medications will be sent to the facility for the patient to continue.  When discharged from the skilled rehab facility, please have the facility set up the patient's Home Health Physical Therapy prior to being released. Also, the skilled facility will be responsible for providing the patient with their medications at time of release from the facility to include their pain medication, the muscle relaxants, and their blood thinner medication. If the patient is still at the rehab facility at time of the two week follow up appointment, the skilled rehab facility will also need to assist the patient in arranging follow up appointment in our office and any transportation needs.  MAKE SURE YOU:  Understand these instructions.  Get help right away if you are not doing well or get worse.   DENTAL ANTIBIOTICS:  In most cases prophylactic antibiotics for Dental procdeures after total joint surgery are not  necessary.  Exceptions are as follows:  1. History of prior total joint infection  2. Severely immunocompromised (Organ Transplant, cancer chemotherapy, Rheumatoid biologic meds such as Humera)  3. Poorly controlled diabetes (A1C &gt; 8.0, blood glucose over 200)  If you have one of these conditions, contact your surgeon for an antibiotic prescription, prior to your dental procedure.    Pick up stool softner and laxative for home use following surgery while on pain medications. Do not submerge incision under water. Please use good hand washing techniques while changing dressing each day. May shower starting three days after surgery. Please use a clean towel to pat the incision dry following showers. Continue to use ice for pain and swelling after surgery. Do not use any lotions or creams on the incision until instructed by your surgeon.  

## 2021-06-21 NOTE — Evaluation (Signed)
Physical Therapy Evaluation Patient Details Name: Kristin Bailey MRN: [id number] DOB: 1961/01/18 Today's Date: 06/21/2021   History of Present Illness  Patient is 60 y.o. female s/p Lt TKA on 06/21/21 with PMH significant for OA, DM, depression, anxiety, fibromyalgia, GERD, HTN, hypothyroidism.    Clinical Impression  Kristin Bailey is a 60 y.o. female POD 0 s/p Lt TKA. Patient reports independence with mobility at baseline. Patient is now limited by functional impairments (see PT problem list below) and requires Min assist for transfers and gait with RW. Patient was able to ambulate ~10 feet with RW and min assist, distance limited by pain. Patient instructed in exercise to facilitate ROM and circulation to manage edema. Patient will benefit from continued skilled PT interventions to address impairments and progress towards PLOF. Acute PT will follow to progress mobility and stair training in preparation for safe discharge home.     Follow Up Recommendations Follow surgeon's recommendation for DC plan and follow-up therapies    Equipment Recommendations  Rolling walker with 5" wheels    Recommendations for Other Services       Precautions / Restrictions Precautions Precautions: Fall Restrictions Weight Bearing Restrictions: No Other Position/Activity Restrictions: WBAT      Mobility  Bed Mobility Overal bed mobility: Needs Assistance Bed Mobility: Supine to Sit     Supine to sit: Min assist;HOB elevated     General bed mobility comments: cues for use of bed rail and assist to bring LE's off EOB. pt raised trunk and scooting without assist.    Transfers Overall transfer level: Needs assistance Equipment used: Rolling walker (2 wheeled) Transfers: Sit to/from Stand Sit to Stand: Min assist         General transfer comment: cues for hand placement. pt initaited power up and min assist required to steady with rise.  Ambulation/Gait Ambulation/Gait assistance: Min  assist Gait Distance (Feet): 10 Feet Assistive device: Rolling walker (2 wheeled) Gait Pattern/deviations: Step-to pattern;Decreased stride length;Decreased weight shift to left Gait velocity: decr   General Gait Details: cues for step to pattern and proximity to RW, no buckling at Lt knee, pt slightly unsteady and min assist required to maintian balance and manage walker. distance limited by pain.  Stairs            Wheelchair Mobility    Modified Rankin (Stroke Patients Only)       Balance Overall balance assessment: Needs assistance Sitting-balance support: Feet supported Sitting balance-Leahy Scale: Good     Standing balance support: During functional activity;Bilateral upper extremity supported Standing balance-Leahy Scale: Poor                               Pertinent Vitals/Pain Pain Assessment: 0-10 Pain Score: 5  Pain Location: Lt knee Pain Descriptors / Indicators: Aching;Discomfort;Burning Pain Intervention(s): Limited activity within patient's tolerance;Monitored during session;Repositioned;Patient requesting pain meds-RN notified;RN gave pain meds during session (RN gave meds at start of session)    Home Living Family/patient expects to be discharged to:: Private residence Living Arrangements: Spouse/significant other Available Help at Discharge: Family Type of Home: House Home Access: Stairs to enter;Level entry (level from outside but then go up through sunroom) Entrance Stairs-Rails: Right Entrance Stairs-Number of Steps: 3 Home Layout: One level Home Equipment: Tub bench;Grab bars - tub/shower Additional Comments: pt's daugther coming friday and staying until monday. pt's husband will be there to help for entire week. then pt's friend will come  stay to help her next week.    Prior Function Level of Independence: Independent               Hand Dominance   Dominant Hand: Right    Extremity/Trunk Assessment   Upper Extremity  Assessment Upper Extremity Assessment: Overall WFL for tasks assessed         Cervical / Trunk Assessment Cervical / Trunk Assessment: Normal  Communication   Communication: No difficulties  Cognition Arousal/Alertness: Awake/alert Behavior During Therapy: WFL for tasks assessed/performed Overall Cognitive Status: Within Functional Limits for tasks assessed                                        General Comments      Exercises Total Joint Exercises Ankle Circles/Pumps: AROM;Both;20 reps;Seated Quad Sets: AROM;5 reps;Left;Seated Heel Slides: AROM;5 reps;Left;Seated   Assessment/Plan    PT Assessment Patient needs continued PT services  PT Problem List Decreased strength;Decreased range of motion;Decreased activity tolerance;Decreased balance;Decreased mobility;Decreased knowledge of use of DME;Decreased knowledge of precautions;Pain       PT Treatment Interventions DME instruction;Gait training;Stair training;Functional mobility training;Therapeutic activities;Therapeutic exercise;Balance training;Patient/family education    PT Goals (Current goals can be found in the Care Plan section)  Acute Rehab PT Goals Patient Stated Goal: get back independence PT Goal Formulation: With patient Time For Goal Achievement: 06/28/21 Potential to Achieve Goals: Good    Frequency 7X/week   Barriers to discharge        Co-evaluation               AM-PAC PT "6 Clicks" Mobility  Outcome Measure Help needed turning from your back to your side while in a flat bed without using bedrails?: None Help needed moving from lying on your back to sitting on the side of a flat bed without using bedrails?: A Little Help needed moving to and from a bed to a chair (including a wheelchair)?: A Little Help needed standing up from a chair using your arms (e.g., wheelchair or bedside chair)?: A Little Help needed to walk in hospital room?: A Little Help needed climbing 3-5  steps with a railing? : A Little 6 Click Score: 19    End of Session Equipment Utilized During Treatment: Gait belt Activity Tolerance: Patient tolerated treatment well Patient left: in chair;with call bell/phone within reach;with chair alarm set Nurse Communication: Mobility status PT Visit Diagnosis: Muscle weakness (generalized) (M62.81);Difficulty in walking, not elsewhere classified (R26.2)    Time: [id number] PT Time Calculation (min) (ACUTE ONLY): 26 min   Charges:   PT Evaluation $PT Eval Low Complexity: 1 Low PT Treatments $Gait Training: 8-22 mins        Verner Mould, DPT Acute Rehabilitation Services Office [phone] Pager [phone]   Jacques Navy 06/21/2021, 3:03 PM

## 2021-06-21 NOTE — Anesthesia Procedure Notes (Signed)
Spinal  Patient location during procedure: OR Start time: 06/21/2021 7:10 AM End time: 06/21/2021 7:13 AM Reason for block: surgical anesthesia Staffing Performed: anesthesiologist  Anesthesiologist: Catalina Gravel, MD Preanesthetic Checklist Completed: patient identified, IV checked, site marked, risks and benefits discussed, surgical consent, monitors and equipment checked, pre-op evaluation and timeout performed Spinal Block Patient position: sitting Prep: DuraPrep and site prepped and draped Patient monitoring: heart rate, cardiac monitor, continuous pulse ox and blood pressure Approach: midline Location: L3-4 Injection technique: single-shot Needle Needle type: Sprotte  Needle gauge: 24 G Needle length: 9 cm Assessment Sensory level: T4 Events: CSF return Additional Notes Functioning IV was confirmed and monitors were applied. Sterile prep and drape, including hand hygiene, mask and sterile gloves were used. The patient was positioned and the spine was prepped. The skin was anesthetized with lidocaine.  Free flow of clear CSF was obtained prior to injecting local anesthetic into the CSF.  The spinal needle aspirated freely following injection.  The needle was carefully withdrawn.  The patient tolerated the procedure well. Consent was obtained prior to procedure with all questions answered and concerns addressed. Risks including but not limited to bleeding, infection, nerve damage, paralysis, failed block, inadequate analgesia, allergic reaction, high spinal, itching and headache were discussed and the patient wished to proceed.   Hoy Morn, MD

## 2021-06-21 NOTE — Anesthesia Postprocedure Evaluation (Signed)
Anesthesia Post Note  Patient: Kristin Bailey  Procedure(s) Performed: TOTAL KNEE ARTHROPLASTY (Left: Knee)     Patient location during evaluation: PACU Anesthesia Type: Spinal Level of consciousness: oriented, awake and alert and awake Pain management: pain level controlled Vital Signs Assessment: post-procedure vital signs reviewed and stable Respiratory status: spontaneous breathing, respiratory function stable and nonlabored ventilation Cardiovascular status: blood pressure returned to baseline and stable Postop Assessment: no headache, no backache, no apparent nausea or vomiting, patient able to bend at knees and spinal receding Anesthetic complications: no   No notable events documented.  Last Vitals:  Vitals:   06/21/21 0957 06/21/21 1153  BP: 130/74 125/78  Pulse: 74 85  Resp: 16 17  Temp: (!) 36.4 C   SpO2: 97% 95%    Last Pain:  Vitals:   06/21/21 0957  TempSrc: Oral  PainSc:                  Catalina Gravel

## 2021-06-21 NOTE — Transfer of Care (Signed)
Immediate Anesthesia Transfer of Care Note  Patient: Hoover Browns  Procedure(s) Performed: TOTAL KNEE ARTHROPLASTY (Left: Knee)  Patient Location: PACU  Anesthesia Type:Spinal  Level of Consciousness: awake, alert , oriented and patient cooperative  Airway & Oxygen Therapy: Patient Spontanous Breathing and Patient connected to face mask oxygen  Post-op Assessment: Report given to RN and Post -op Vital signs reviewed and stable  Post vital signs: Reviewed and stable  Last Vitals:  Vitals Value Taken Time  BP 113/70 06/21/21 0843  Temp    Pulse 82 06/21/21 0845  Resp 25 06/21/21 0845  SpO2 100 % 06/21/21 0845  Vitals shown include unvalidated device data.  Last Pain:  Vitals:   06/21/21 0549  TempSrc: Oral  PainSc: 4       Patients Stated Pain Goal: 3 ([id number])  Complications: No notable events documented.

## 2021-06-21 NOTE — Progress Notes (Signed)
Orthopedic Tech Progress Note Patient Details:  Kristin Bailey [birth year] [id number]  Patient ID: Kristin Bailey, female   DOB: 1961-04-30, 60 y.o.   MRN: [id number]  Kristin Bailey 06/21/2021, 9:18 AM Cpm applied in pacu

## 2021-06-21 NOTE — Anesthesia Procedure Notes (Signed)
Anesthesia Regional Block: Adductor canal block   Pre-Anesthetic Checklist: , timeout performed,  Correct Patient, Correct Site, Correct Laterality,  Correct Procedure, Correct Position, site marked,  Risks and benefits discussed,  Surgical consent,  Pre-op evaluation,  At surgeon's request and post-op pain management  Laterality: Left  Prep: chloraprep       Needles:  Injection technique: Single-shot  Needle Type: Echogenic Needle     Needle Length: 9cm  Needle Gauge: 21     Additional Needles:   Procedures:,,,, ultrasound used (permanent image in chart),,    Narrative:  Start time: 06/21/2021 6:48 AM End time: 06/21/2021 6:55 AM Injection made incrementally with aspirations every 5 mL.  Performed by: Personally  Anesthesiologist: Catalina Gravel, MD  Additional Notes: No pain on injection. No increased resistance to injection. Injection made in 5cc increments.  Good needle visualization.  Patient tolerated procedure well.

## 2021-06-21 NOTE — Op Note (Signed)
OPERATIVE REPORT-TOTAL KNEE ARTHROPLASTY   Pre-operative diagnosis- Osteoarthritis  Left knee(s)  Post-operative diagnosis- Osteoarthritis Left knee(s)  Procedure-  Left  Total Knee Arthroplasty  Surgeon- Dione Plover. Keymoni Mccaster, MD  Assistant- Molli Barrows, PA-C   Anesthesia-   Adductor canal; block and spinal  EBL-10 mL   Drains None  Tourniquet time-  Total Tourniquet Time Documented: Thigh (Left) - 37 minutes Total: Thigh (Left) - 37 minutes     Complications- None  Condition-PACU - hemodynamically stable.   Brief Clinical Note  Kristin Bailey is a 60 y.o. year old female with end stage OA of her left knee with progressively worsening pain and dysfunction. She has constant pain, with activity and at rest and significant functional deficits with difficulties even with ADLs. She has had extensive non-op management including analgesics, injections of cortisone, and home exercise program, but remains in significant pain with significant dysfunction. Radiographs show bone on bone arthritis medial and patellofemoral. She presents now for left Total Knee Arthroplasty.     Procedure in detail---   The patient is brought into the operating room and positioned supine on the operating table. After successful administration of  Adductor canal; block and spinal,   a tourniquet is placed high on the  Left thigh(s) and the lower extremity is prepped and draped in the usual sterile fashion. Time out is performed by the operating team and then the  Left lower extremity is wrapped in Esmarch, knee flexed and the tourniquet inflated to 300 mmHg.       A midline incision is made with a ten blade through the subcutaneous tissue to the level of the extensor mechanism. A fresh blade is used to make a medial parapatellar arthrotomy. Soft tissue over the proximal medial tibia is subperiosteally elevated to the joint line with a knife and into the semimembranosus bursa with a Cobb elevator. Soft tissue  over the proximal lateral tibia is elevated with attention being paid to avoiding the patellar tendon on the tibial tubercle. The patella is everted, knee flexed 90 degrees and the ACL and PCL are removed. Findings are bone on bone all 3 compartments with massive global osteophytes and large loose calcified bodies.        The drill is used to create a starting hole in the distal femur and the canal is thoroughly irrigated with sterile saline to remove the fatty contents. The 5 degree Left  valgus alignment guide is placed into the femoral canal and the distal femoral cutting block is pinned to remove 9 mm off the distal femur. Resection is made with an oscillating saw.      The tibia is subluxed forward and the menisci are removed. The extramedullary alignment guide is placed referencing proximally at the medial aspect of the tibial tubercle and distally along the second metatarsal axis and tibial crest. The block is pinned to remove 62m off the more deficient medial  side. Resection is made with an oscillating saw. Size 5is the most appropriate size for the tibia and the proximal tibia is prepared with the modular drill and keel punch for that size.      The femoral sizing guide is placed and size 5 is most appropriate. Rotation is marked off the epicondylar axis and confirmed by creating a rectangular flexion gap at 90 degrees. The size 5 cutting block is pinned in this rotation and the anterior, posterior and chamfer cuts are made with the oscillating saw. The intercondylar block is then placed and  that cut is made.      Trial size 5 tibial component, trial size 5 posterior stabilized femur and a 8  mm posterior stabilized rotating platform insert trial is placed. Full extension is achieved with excellent varus/valgus and anterior/posterior balance throughout full range of motion. The patella is everted and thickness measured to be 22  mm. Free hand resection is taken to 12 mm, a 38 template is placed, lug  holes are drilled, trial patella is placed, and it tracks normally. Osteophytes are removed off the posterior femur with the trial in place. All trials are removed and the cut bone surfaces prepared with pulsatile lavage. Cement is mixed and once ready for implantation, the size 5 tibial implant, size  5 posterior stabilized femoral component, and the size 38 patella are cemented in place and the patella is held with the clamp. The trial insert is placed and the knee held in full extension. The Exparel (20 ml mixed with 60 ml saline) is injected into the extensor mechanism, posterior capsule, medial and lateral gutters and subcutaneous tissues.  All extruded cement is removed and once the cement is hard the permanent 8 mm posterior stabilized rotating platform insert is placed into the tibial tray.      The wound is copiously irrigated with saline solution and the extensor mechanism closed with # 0 Stratofix suture. The tourniquet is released for a total tourniquet time of 37  minutes. Flexion against gravity is 140 degrees and the patella tracks normally. Subcutaneous tissue is closed with 2.0 vicryl and subcuticular with running 4.0 Monocryl. The incision is cleaned and dried and steri-strips and a bulky sterile dressing are applied. The limb is placed into a knee immobilizer and the patient is awakened and transported to recovery in stable condition.      Please note that a surgical assistant was a medical necessity for this procedure in order to perform it in a safe and expeditious manner. Surgical assistant was necessary to retract the ligaments and vital neurovascular structures to prevent injury to them and also necessary for proper positioning of the limb to allow for anatomic placement of the prosthesis.   Dione Plover Lanora Reveron, MD    06/21/2021, 8:16 AM

## 2021-06-21 NOTE — Interval H&P Note (Signed)
History and Physical Interval Note:  06/21/2021 6:24 AM  Kristin Bailey  has presented today for surgery, with the diagnosis of Left knee osteoarthritis.  The various methods of treatment have been discussed with the patient and family. After consideration of risks, benefits and other options for treatment, the patient has consented to  Procedure(s) with comments: TOTAL KNEE ARTHROPLASTY (Left) - 51mn as a surgical intervention.  The patient's history has been reviewed, patient examined, no change in status, stable for surgery.  I have reviewed the patient's chart and labs.  Questions were answered to the patient's satisfaction.     FPilar PlateAluisio

## 2021-06-22 DIAGNOSIS — M1712 Unilateral primary osteoarthritis, left knee: Secondary | ICD-10-CM | POA: Diagnosis not present

## 2021-06-22 LAB — CBC
HCT: 35.7 % — ABNORMAL LOW (ref 36.0–46.0)
Hemoglobin: 11.7 g/dL — ABNORMAL LOW (ref 12.0–15.0)
MCH: 28.5 pg (ref 26.0–34.0)
MCHC: 32.8 g/dL (ref 30.0–36.0)
MCV: 87.1 fL (ref 80.0–100.0)
Platelets: 234 10*3/uL (ref 150–400)
RBC: 4.1 MIL/uL (ref 3.87–5.11)
RDW: 14 % (ref 11.5–15.5)
WBC: 20.5 10*3/uL — ABNORMAL HIGH (ref 4.0–10.5)
nRBC: 0 % (ref 0.0–0.2)

## 2021-06-22 LAB — BASIC METABOLIC PANEL
Anion gap: 8 (ref 5–15)
BUN: 13 mg/dL (ref 6–20)
CO2: 27 mmol/L (ref 22–32)
Calcium: 8.7 mg/dL — ABNORMAL LOW (ref 8.9–10.3)
Chloride: 102 mmol/L (ref 98–111)
Creatinine, Ser: 0.63 mg/dL (ref 0.44–1.00)
GFR, Estimated: >60 mL/min (ref >60)
Glucose, Bld: 160 mg/dL — ABNORMAL HIGH (ref 70–99)
Potassium: 3.8 mmol/L (ref 3.5–5.1)
Sodium: 137 mmol/L (ref 135–145)

## 2021-06-22 LAB — GLUCOSE, CAPILLARY
Glucose-Capillary: 111 mg/dL — ABNORMAL HIGH (ref 70–99)
Glucose-Capillary: 147 mg/dL — ABNORMAL HIGH (ref 70–99)

## 2021-06-22 MED ORDER — ASPIRIN 325 MG PO TBEC: 325.0000 mg | DELAYED_RELEASE_TABLET | Freq: Two times a day (BID) | ORAL | 0 refills | Status: AC

## 2021-06-22 MED ORDER — METHOCARBAMOL 500 MG PO TABS: 500.0000 mg | ORAL_TABLET | Freq: Four times a day (QID) | ORAL | 0 refills | Status: DC | PRN

## 2021-06-22 MED ORDER — OXYCODONE HCL 5 MG PO TABS: 5.0000 mg | ORAL_TABLET | Freq: Four times a day (QID) | ORAL | 0 refills | Status: DC | PRN

## 2021-06-22 NOTE — Progress Notes (Signed)
Physical Therapy Treatment Patient Details Name: Kristin Bailey MRN: [id number] DOB: [birth year] Today's Date: 06/22/2021    History of Present Illness Patient is 60 y.o. female s/p Lt TKA on 06/21/21 with PMH significant for OA, DM, depression, anxiety, fibromyalgia, GERD, HTN, hypothyroidism.    PT Comments    Patient progressing well with therapy and advanced gait distance to ~120' with RW and min guard for safety. Pt demonstrated good carryover for safe hand placement and proximity to walker with transfers/gait. Educated on HEP for ROM and strengthening. Pt's husband plans to be present for afternoon session and will continue to progress gait and stair training for safe discharge home.    Follow Up Recommendations  Follow surgeon's recommendation for DC plan and follow-up therapies     Equipment Recommendations  Rolling walker with 5" wheels    Recommendations for Other Services       Precautions / Restrictions Precautions Precautions: Fall Restrictions Weight Bearing Restrictions: No Other Position/Activity Restrictions: WBAT    Mobility  Bed Mobility Overal bed mobility: Needs Assistance Bed Mobility: Supine to Sit     Supine to sit: HOB elevated;Supervision     General bed mobility comments: extra time, elevated HOB.    Transfers Overall transfer level: Needs assistance Equipment used: Rolling walker (2 wheeled) Transfers: Sit to/from Stand Sit to Stand: Min guard         General transfer comment: good carryover for safe hand placement, close guard for safety with rise from EOB and toilet. extra effort for power up.  Ambulation/Gait Ambulation/Gait assistance: Min assist Gait Distance (Feet): 120 Feet Assistive device: Rolling walker (2 wheeled) Gait Pattern/deviations: Step-to pattern;Decreased stride length;Decreased weight shift to left Gait velocity: decr   General Gait Details: cues for posture and proximity to RW. no overt LOB noted throughout.  pt amb to bathroom and additional distance in hallway.   Stairs             Wheelchair Mobility    Modified Rankin (Stroke Patients Only)       Balance Overall balance assessment: Needs assistance Sitting-balance support: Feet supported Sitting balance-Leahy Scale: Good     Standing balance support: During functional activity;Bilateral upper extremity supported Standing balance-Leahy Scale: Poor                              Cognition Arousal/Alertness: Awake/alert Behavior During Therapy: WFL for tasks assessed/performed Overall Cognitive Status: Within Functional Limits for tasks assessed                                        Exercises Total Joint Exercises Ankle Circles/Pumps: AROM;Both;20 reps;Seated Quad Sets: AROM;5 reps;Left;Seated Heel Slides: AROM;5 reps;Left;Seated Hip ABduction/ADduction: AROM;5 reps;Left;Seated    General Comments        Pertinent Vitals/Pain Pain Assessment: 0-10 Pain Score: 6  Pain Location: Lt knee Pain Descriptors / Indicators: Aching;Discomfort;Burning Pain Intervention(s): Limited activity within patient's tolerance;Monitored during session;Repositioned;RN gave pain meds during session    Home Living                      Prior Function            PT Goals (current goals can now be found in the care plan section) Acute Rehab PT Goals Patient Stated Goal: get back independence PT Goal Formulation: With  patient Time For Goal Achievement: 06/28/21 Potential to Achieve Goals: Good Progress towards PT goals: Progressing toward goals    Frequency    7X/week      PT Plan Current plan remains appropriate    Co-evaluation              AM-PAC PT "6 Clicks" Mobility   Outcome Measure  Help needed turning from your back to your side while in a flat bed without using bedrails?: None Help needed moving from lying on your back to sitting on the side of a flat bed without  using bedrails?: A Little Help needed moving to and from a bed to a chair (including a wheelchair)?: A Little Help needed standing up from a chair using your arms (e.g., wheelchair or bedside chair)?: A Little Help needed to walk in hospital room?: A Little Help needed climbing 3-5 steps with a railing? : A Little 6 Click Score: 19    End of Session Equipment Utilized During Treatment: Gait belt Activity Tolerance: Patient tolerated treatment well Patient left: in chair;with call bell/phone within reach;with chair alarm set Nurse Communication: Mobility status PT Visit Diagnosis: Muscle weakness (generalized) (M62.81);Difficulty in walking, not elsewhere classified (R26.2)     Time: [id number] PT Time Calculation (min) (ACUTE ONLY): 32 min  Charges:  $Gait Training: 8-22 mins $Therapeutic Exercise: 8-22 mins                     Verner Mould, DPT Acute Rehabilitation Services Office [phone] Pager [phone]    Jacques Navy 06/22/2021, 12:23 PM

## 2021-06-22 NOTE — Progress Notes (Signed)
Physical Therapy Treatment Patient Details Name: Kristin Bailey MRN: [id number] DOB: Feb 24, 1961 Today's Date: 06/22/2021    History of Present Illness Patient is 60 y.o. female s/p Lt TKA on 06/21/21 with PMH significant for OA, DM, depression, anxiety, fibromyalgia, GERD, HTN, hypothyroidism.    PT Comments    Patient making good progress with acute PT and demonstrates good safety awareness with use of RW for gait and transfers. Pt's spouse present and provided safe guarding/assist for gait and stair mobility with cues and supervision from therapist. Reviewed seated exercises for HEP and addressed all questions for discharge. Patient is at safe level for discharge home with assist from husband. Acute PT will continue to progress throughout stay.     Follow Up Recommendations  Follow surgeon's recommendation for DC plan and follow-up therapies     Equipment Recommendations  Rolling walker with 5" wheels    Recommendations for Other Services       Precautions / Restrictions Precautions Precautions: Fall Restrictions Weight Bearing Restrictions: No Other Position/Activity Restrictions: WBAT    Mobility  Bed Mobility               General bed mobility comments: pt OOB in recliner    Transfers Overall transfer level: Needs assistance Equipment used: Rolling walker (2 wheeled) Transfers: Sit to/from Stand Sit to Stand: Supervision         General transfer comment: good carryover for safe technique. supervision for safety.  Ambulation/Gait Ambulation/Gait assistance: Min guard;Supervision Gait Distance (Feet): 80 Feet Assistive device: Rolling walker (2 wheeled) Gait Pattern/deviations: Step-to pattern;Decreased stride length;Decreased weight shift to left Gait velocity: decr   General Gait Details: pt maintained safe step pattern and proximity to RW throughout. no overt LOB noted. pt steady with RW with min guard from husband with supervision and cues from  therapist.   Stairs Stairs: Yes Stairs assistance: Min guard;Min assist Stair Management: No rails;Step to pattern;Backwards;With walker Number of Stairs: 4 General stair comments: cues for sequencing "up with good, down with bad" and for walker position. pt's spouse provided safe guarding/assist for mobility.   Wheelchair Mobility    Modified Rankin (Stroke Patients Only)       Balance Overall balance assessment: Needs assistance Sitting-balance support: Feet supported Sitting balance-Leahy Scale: Good     Standing balance support: During functional activity;Bilateral upper extremity supported Standing balance-Leahy Scale: Fair                              Cognition Arousal/Alertness: Awake/alert Behavior During Therapy: WFL for tasks assessed/performed Overall Cognitive Status: Within Functional Limits for tasks assessed                                        Exercises Total Joint Exercises Long Arc Quad: AROM;5 reps;Left;Seated Knee Flexion: AROM;5 reps;Left;Seated    General Comments        Pertinent Vitals/Pain Pain Assessment: 0-10 Pain Score: 5  Pain Location: Lt knee Pain Descriptors / Indicators: Aching;Discomfort;Burning Pain Intervention(s): Limited activity within patient's tolerance;Monitored during session;Repositioned;Ice applied    Home Living                      Prior Function            PT Goals (current goals can now be found in the care plan section)  Acute Rehab PT Goals Patient Stated Goal: get back independence PT Goal Formulation: With patient Time For Goal Achievement: 06/28/21 Potential to Achieve Goals: Good Progress towards PT goals: Progressing toward goals    Frequency    7X/week      PT Plan Current plan remains appropriate    Co-evaluation              AM-PAC PT "6 Clicks" Mobility   Outcome Measure  Help needed turning from your back to your side while in a flat  bed without using bedrails?: None Help needed moving from lying on your back to sitting on the side of a flat bed without using bedrails?: A Little Help needed moving to and from a bed to a chair (including a wheelchair)?: A Little Help needed standing up from a chair using your arms (e.g., wheelchair or bedside chair)?: A Little Help needed to walk in hospital room?: A Little Help needed climbing 3-5 steps with a railing? : A Little 6 Click Score: 19    End of Session Equipment Utilized During Treatment: Gait belt Activity Tolerance: Patient tolerated treatment well Patient left: in chair;with call bell/phone within reach;with chair alarm set Nurse Communication: Mobility status PT Visit Diagnosis: Muscle weakness (generalized) (M62.81);Difficulty in walking, not elsewhere classified (R26.2)     Time: [id number] PT Time Calculation (min) (ACUTE ONLY): 34 min  Charges:  $Gait Training: 23-37 mins                     Kristin Bailey, DPT Acute Rehabilitation Services Office [phone] Pager [phone]    Kristin Bailey 06/22/2021, 1:59 PM

## 2021-06-22 NOTE — Progress Notes (Signed)
D/C instructions given to patient. Patient had no questions. NT or writer will wheel patient out once she is dressed and ready to go

## 2021-06-22 NOTE — TOC Transition Note (Signed)
Transition of Care [facility]) - CM/SW Discharge Note   Patient Details  Name: Kristin Bailey MRN: [id number] Date of Birth: [birth year]  [facility] CM/SW Contact:  Lennart Pall, LCSW Phone Number: 06/22/2021, 2:09 PM   Clinical Narrative:    Met with pt and confirming she has received her rolling walker via [employer].  Plan for OPPT at [facility] in [place].  No further TOC needs.   Final next level of care: OP Rehab Barriers to Discharge: No Barriers Identified   Patient Goals and CMS Choice Patient states their goals for this hospitalization and ongoing recovery are:: return home      Discharge Placement                       Discharge Plan and Services                DME Arranged: Walker rolling DME Agency: [employer] Date DME Agency Contacted:  (arranged prior to surgery)                Social Determinants of Health (SDOH) Interventions     Readmission Risk Interventions No flowsheet data found.

## 2021-06-22 NOTE — Progress Notes (Signed)
   Subjective: 1 Day Post-Op Procedure(s) (LRB): TOTAL KNEE ARTHROPLASTY (Left) Patient reports pain as mild.   Patient seen in rounds by Dr. Wynelle Link. Patient is well, and has had no acute complaints or problems. Denies chest pain or SOB. Foley catheter removed this AM. No issues overnight.  We will continue therapy today.   Objective: Vital signs in last 24 hours: Temp:  [97.2 F (36.2 C)-98.1 F (36.7 C)] 98 F (36.7 C) (08/09 0615) Pulse Rate:  [74-96] 96 (08/09 0615) Resp:  [15-25] 18 (08/09 0615) BP: (108-135)/(64-78) 135/75 (08/09 0615) SpO2:  [94 %-100 %] 94 % (08/09 0615)  Intake/Output from previous day:  Intake/Output Summary (Last 24 hours) at 06/22/2021 0728 Last data filed at 06/22/2021 0640 Gross per 24 hour  Intake 4147.68 ml  Output 5130 ml  Net -982.32 ml     Intake/Output this shift: No intake/output data recorded.  Labs: Recent Labs    06/22/21 0311  HGB 11.7*   Recent Labs    06/22/21 0311  WBC 20.5*  RBC 4.10  HCT 35.7*  PLT 234   Recent Labs    06/22/21 0311  NA 137  K 3.8  CL 102  CO2 27  BUN 13  CREATININE 0.63  GLUCOSE 160*  CALCIUM 8.7*   No results for input(s): LABPT, INR in the last 72 hours.  Exam: General - Patient is Alert and Oriented Extremity - Neurologically intact Neurovascular intact Sensation intact distally Dorsiflexion/Plantar flexion intact Dressing - dressing C/D/I Motor Function - intact, moving foot and toes well on exam.   Past Medical History:  Diagnosis Date   Anxiety    Arthritis    Complication of anesthesia    Coronary artery disease    Depression    Diabetes mellitus without complication (HCC)    Diverticulitis    Fibromyalgia    GERD (gastroesophageal reflux disease)    History of kidney stones    Hypertension    Hypothyroidism    IBS (irritable bowel syndrome)    Pneumonia    PONV (postoperative nausea and vomiting)    Sleep apnea    Thyroid disease     Assessment/Plan: 1 Day  Post-Op Procedure(s) (LRB): TOTAL KNEE ARTHROPLASTY (Left) Principal Problem:   OA (osteoarthritis) of knee  Estimated body mass index is 39.66 kg/m as calculated from the following:   Height as of this encounter: 5' 5.5" (1.664 m).   Weight as of this encounter: 109.8 kg. Advance diet Up with therapy D/C IV fluids   Patient's anticipated LOS is less than 2 midnights, meeting these requirements: - Younger than 54 - Lives within 1 hour of care - Has a competent adult at home to recover with post-op recover - NO history of  - Chronic pain requiring opiods  - Diabetes  - Coronary Artery Disease  - Heart failure  - Heart attack  - Stroke  - DVT/VTE  - Cardiac arrhythmia  - Respiratory Failure/COPD  - Renal failure  - Anemia  - Advanced Liver disease  DVT Prophylaxis - Aspirin Weight bearing as tolerated. Continue therapy.  Plan is to go Home after hospital stay. Plan for discharge later today if progresses with therapy and meeting her goals. Scheduled for OPPT at [facility] in [place] Follow-up in the office in 2 weeks  The [place] was reviewed today prior to any opioid medications being prescribed to this patient.  Theresa Duty, PA-C Orthopedic Surgery [phone] 06/22/2021, 7:28 AM

## 2021-06-24 ENCOUNTER — Encounter (HOSPITAL_COMMUNITY): Payer: Self-pay | Admitting: Orthopedic Surgery

## 2021-08-25 NOTE — H&P (Signed)
TOTAL KNEE ADMISSION H&P  Patient is being admitted for right total knee arthroplasty.  Subjective:  Chief Complaint: Right knee pain.  HPI: Kristin Bailey, 60 y.o. female has a history of pain and functional disability in the right knee due to arthritis and has failed non-surgical conservative treatments for greater than 12 weeks to include NSAID's and/or analgesics, flexibility and strengthening excercises, and activity modification. Onset of symptoms was gradual, starting  several  years ago with gradually worsening course since that time. The patient noted no past surgery on the right knee.  Patient currently rates pain in the right knee at 7 out of 10 with activity. Patient has worsening of pain with activity and weight bearing, pain that interferes with activities of daily living, pain with passive range of motion, and crepitus. Patient has evidence of periarticular osteophytes and joint space narrowing by imaging studies.There is no active infection.  Patient Active Problem List   Diagnosis Date Noted   OA (osteoarthritis) of knee 06/21/2021   S/P partial colectomy 04/22/2019   Sigmoid diverticulitis 04/22/2019   Elevated lactic acid level    Obesity, Class III, BMI 40-49.9 (morbid obesity) ([place])    Gastroesophageal reflux disease    Sepsis without acute organ dysfunction ([place]) 03/02/2019   Syncope 03/02/2019   HTN (hypertension) 03/02/2019   MDD (major depressive disorder), recurrent episode, mild ([place]) 09/19/2018   Abdominal pain, left lower quadrant    Diverticulitis 09/26/2017   Hyponatremia 09/26/2017   Noninfectious gastroenteritis 11/28/2016   Enteritis 11/04/2016   Lactic acidosis 11/04/2016   Enterocolitis 03/17/2016   Gastroenteritis, infectious 03/17/2016   Leukocytosis 11/28/2015   Acute respiratory failure with hypoxia ([place]) 11/28/2015   Controlled type 2 diabetes mellitus without complication, without long-term current use of insulin ([place]) 11/28/2015   UTI  (lower urinary tract infection) 11/27/2015   Colitis 11/27/2015   AKI (acute kidney injury) ([place]) 11/27/2015   Depression with anxiety     Past Medical History:  Diagnosis Date   Anxiety    Arthritis    Complication of anesthesia    Coronary artery disease    Depression    Diabetes mellitus without complication ([place])    Diverticulitis    Fibromyalgia    GERD (gastroesophageal reflux disease)    History of kidney stones    Hypertension    Hypothyroidism    IBS (irritable bowel syndrome)    Pneumonia    PONV (postoperative nausea and vomiting)    Sleep apnea    Thyroid disease     Past Surgical History:  Procedure Laterality Date   AGILE CAPSULE N/A 11/23/2016   Procedure: AGILE CAPSULE;  Surgeon: Rogene Houston, MD;  [facility];  Service: Endoscopy;  Laterality: N/A;  8:30   BREAST EXCISIONAL BIOPSY Right    CARPAL TUNNEL RELEASE     CESAREAN SECTION     CHOLECYSTECTOMY     COLONOSCOPY N/A 08/28/2014   Procedure: COLONOSCOPY;  Surgeon: Rogene Houston, MD;  Location: [facility];  Service: Endoscopy;  Laterality: N/A;  1030   CYST REMOVAL HAND     ESOPHAGOGASTRODUODENOSCOPY N/A 03/19/2016   Procedure: ESOPHAGOGASTRODUODENOSCOPY (EGD);  Surgeon: Rogene Houston, MD;  Location: [facility];  Service: Endoscopy;  Laterality: N/A;   FLEXIBLE SIGMOIDOSCOPY N/A 09/28/2017   Procedure: FLEXIBLE SIGMOIDOSCOPY;  Surgeon: Rogene Houston, MD;  [facility]: [facility];  Service: Endoscopy;  Laterality: N/A;   FRACTURE SURGERY     rt ankle  GIVENS CAPSULE STUDY N/A 12/15/2016   Procedure: GIVENS CAPSULE STUDY;  Surgeon: Rogene Houston, MD;  [facility]: [facility];  Service: Endoscopy;  Laterality: N/A;   KNEE SURGERY     meniscus repair   LAPAROSCOPIC PARTIAL COLECTOMY N/A 04/22/2019   Procedure: LAPAROSCOPIC PARTIAL SIGMOID COLECTOMY;  Surgeon: Virl Cagey, MD;  Location: [facility];  Service: General;  Laterality: N/A;   TOTAL KNEE ARTHROPLASTY Left 06/21/2021    Procedure: TOTAL KNEE ARTHROPLASTY;  Surgeon: Gaynelle Arabian, MD;  Location: [facility];  Service: Orthopedics;  Laterality: Left;   TUBAL LIGATION      Prior to Admission medications   Medication Sig Start Date End Date Taking? Authorizing Provider  acetaminophen (TYLENOL) 650 MG CR tablet Take 1,300 mg by mouth See admin instructions. Take 2 tablets daily. Take 2 tablets once a day as needed for mild pain.    [provider]  ALPRAZolam Duanne Moron) 0.5 MG tablet Take 1 mg by mouth at bedtime. Take 2 tablets by mouth every evening 02/03/21   [provider]  Ascorbic Acid (VITAMIN C PO) Take 2,000 mg by mouth daily.    [provider]  cetirizine (ZYRTEC) 10 MG tablet Take 10 mg by mouth daily.    [provider]  Cholecalciferol (VITAMIN D) 125 MCG (5000 UT) CAPS Take 5,000 Units by mouth daily.    [provider]  docusate sodium (COLACE) 100 MG capsule Take 1 capsule (100 mg total) by mouth 2 (two) times daily. Patient taking differently: Take 300 mg by mouth every evening. 04/26/19   Virl Cagey, MD  DULoxetine (CYMBALTA) 60 MG capsule Take 1 capsule (60 mg total) by mouth 2 (two) times daily. 03/13/19   Norman Clay, MD  gabapentin (NEURONTIN) 300 MG capsule Take 300 mg by mouth at bedtime. 01/17/21   [provider]  glipiZIDE (GLUCOTROL XL) 10 MG 24 hr tablet Take 10 mg by mouth daily. 12/09/20   [provider]  glucosamine-chondroitin 500-400 MG tablet Take 1 tablet by mouth 2 (two) times a day.    [provider]  levothyroxine (SYNTHROID, LEVOTHROID) 100 MCG tablet Take 100 mcg by mouth daily before breakfast.  08/16/17   [provider]  lisinopril-hydrochlorothiazide (ZESTORETIC) 20-12.5 MG tablet Take 0.5 tablets by mouth daily.    [provider]  loperamide (IMODIUM) 2 MG capsule Take 1 capsule (2 mg total) by mouth 4 (four) times daily as needed for diarrhea or loose stools. 06/09/20   Hayden Rasmussen, MD  methocarbamol (ROBAXIN) 500 MG tablet Take 1 tablet (500 mg total) by mouth every 6 (six) hours as needed for muscle spasms. 06/22/21   Edmisten, Ok Anis, PA  Multiple Vitamin (MULTIVITAMIN WITH MINERALS) TABS tablet Take 1 tablet by mouth daily.    [provider]  Omega-3 Fatty Acids (FISH OIL) 1000 MG CAPS Take 1,000 mg by mouth daily.    [provider]  omeprazole (PRILOSEC OTC) 20 MG tablet Take 1 tablet (20 mg total) by mouth daily. Patient taking differently: Take 20 mg by mouth every other day. 01/03/18   Rehman, Mechele Dawley, MD  ondansetron (ZOFRAN) 4 MG tablet Take 1 tablet (4 mg total) by mouth every 8 (eight) hours as needed for nausea or vomiting. 06/09/20   Hayden Rasmussen, MD  oxyCODONE (OXY IR/ROXICODONE) 5 MG immediate release tablet Take 1-2 tablets (5-10 mg total) by mouth every 6 (six) hours as needed for moderate pain or severe pain. Not  to exceed 6 tablets a day. 06/22/21   Edmisten, Ok Anis, PA  Semaglutide (OZEMPIC, 0.25 OR 0.5 MG/DOSE, ) Inject 0.5 mg into the skin every Thursday.    [provider]  valACYclovir (VALTREX) 1000 MG tablet Take 2,000 mg by mouth See admin instructions. As needed for fever blisters, take 2 tablets twice a day for two doses.    [provider]  zinc gluconate 50 MG tablet Take 50 mg by mouth daily.    [provider]    No Known Allergies  Social History   Socioeconomic History   Marital status: Married    Spouse name: Not on file   Number of children: Not on file   Years of education: Not on file   Highest education level: Not on file  Occupational History   Not on file  Tobacco Use   Smoking status: Never   Smokeless tobacco: Never  Vaping Use   Vaping Use: Never used  Substance and Sexual Activity   Alcohol use: No   Drug use: No   Sexual activity: Yes    Birth control/protection: Surgical  Other Topics Concern   Not on file  Social History Narrative   Works with  special needs adults.   Social Determinants of Health   Financial Resource Strain: Not on file  Food Insecurity: Not on file  Transportation Needs: Not on file  Physical Activity: Not on file  Stress: Not on file  Social Connections: Not on file  Intimate Partner Violence: Not on file    Tobacco Use: Low Risk    Smoking Tobacco Use: Never   Smokeless Tobacco Use: Never   Social History   Substance and Sexual Activity  Alcohol Use No    Family History  Problem Relation Age of Onset   Cancer Mother        colon cancer age74   Hypertension Mother    Stroke Mother    Diabetes Mother    Non-Hodgkin's lymphoma Father    Hypertension Father    Diabetes Father    Stroke Maternal Grandmother    Kidney disease Paternal Aunt    Cancer Paternal Aunt    Kidney disease Paternal Uncle    Cancer Paternal Uncle     ROS: Constitutional: no fever, no chills, no night sweats, no significant weight loss Cardiovascular: no chest pain, no palpitations Respiratory: no cough, no shortness of breath, No COPD Gastrointestinal: no vomiting, no nausea Musculoskeletal: no swelling in Joints, Joint Pain Neurologic: no numbness, no tingling, no difficulty with balance   Objective:  Physical Exam: Well nourished and well developed.  General: Alert and oriented x3, cooperative and pleasant, no acute distress.  Head: normocephalic, atraumatic, neck supple.  Eyes: EOMI.  Respiratory: breath sounds clear in all fields, no wheezing, rales, or rhonchi. Cardiovascular: Regular rate and rhythm, no murmurs, gallops or rubs.  Abdomen: non-tender to palpation and soft, normoactive bowel sounds. Musculoskeletal:  Bilateral Hip Exam:  The range of motion: normal without discomfort.    Right Knee Exam:  No effusion present. No swelling present.  The range of motion is: 5 to 120 degrees.  Marked crepitus on range of motion of the knee.  Positive medial greater than lateral joint line tenderness.   The knee is stable.    The patient's sensation and motor function are intact in their lower extremities. Their distal pulses are 2+. The bilateral calves are soft and non-tender.      Vital signs in last  24 hours: BP: ()/()  Arterial Line BP: ()/()   Imaging Review Radiographs- [facility] and lateral of the bilateral knees dated 03/18/2021 demonstrate bone-on-bone arthritis in the medial and patellofemoral compartments of the bilateral knees.   Assessment/Plan:  End stage arthritis, right knee   The patient history, physical examination, clinical judgment of the provider and imaging studies are consistent with end stage degenerative joint disease of the right knee and total knee arthroplasty is deemed medically necessary. The treatment options including medical management, injection therapy arthroscopy and arthroplasty were discussed at length. The risks and benefits of total knee arthroplasty were presented and reviewed. The risks due to aseptic loosening, infection, stiffness, patella tracking problems, thromboembolic complications and other imponderables were discussed. The patient acknowledged the explanation, agreed to proceed with the plan and consent was signed. Patient is being admitted for inpatient treatment for surgery, pain control, PT, OT, prophylactic antibiotics, VTE prophylaxis, progressive ambulation and ADLs and discharge planning. The patient is planning to be discharged  home .   Patient's anticipated LOS is less than 2 midnights, meeting these requirements: - Younger than 48 - Lives within 1 hour of care - Has a competent adult at home to recover with post-op recover - NO history of  - Chronic pain requiring opiods  - Diabetes  - Coronary Artery Disease  - Heart failure  - Heart attack  - Stroke  - DVT/VTE  - Cardiac arrhythmia  - Respiratory Failure/COPD  - Renal failure  - Anemia  - Advanced Liver disease    Therapy Plans: [facility] in [place] Disposition: Home  with Husband Planned DVT Prophylaxis: Aspiring 325mg  DME Needed: None PCP: Delphina Cahill, MD (clearance received) TXA: IV Allergies: None Anesthesia Concerns: Sleep Apnea BMI: 38.7 Last HgbA1c: 6.3  Pharmacy: [facility] in [place]  Other: Patient not given pain medicine prior to leaving hospital - will send in medicine on Friday 09/10/2021.   - Patient was instructed on what medications to stop prior to surgery. - Follow-up visit in 2 weeks with Dr. Wynelle Link - Begin physical therapy following surgery - Pre-operative lab work as pre-surgical testing - Prescriptions will be provided in hospital at time of discharge  Fenton Foy, [facility], PA-C Orthopedic Surgery [facility] [place]

## 2021-08-30 NOTE — Patient Instructions (Addendum)
DUE TO COVID-19 ONLY ONE VISITOR IS ALLOWED TO COME WITH YOU AND STAY IN THE WAITING ROOM ONLY DURING PRE OP AND PROCEDURE.   **NO VISITORS ARE ALLOWED IN THE SHORT STAY AREA OR RECOVERY ROOM!!**  IF YOU WILL BE ADMITTED INTO THE HOSPITAL YOU ARE ALLOWED ONLY TWO SUPPORT PEOPLE DURING VISITATION HOURS ONLY (10AM -8PM)   The support person(s) may change daily. The support person(s) must pass our screening, gel in and out, and wear a mask at all times, including in the patient's room. Patients must also wear a mask when staff or their support person are in the room.  No visitors under the age of 44. Any visitor under the age of 57 must be accompanied by an adult.    COVID SWAB TESTING MUST BE COMPLETED ON: 09/09/21                  8A - 3P **MUST PRESENT COMPLETED FORM AT TESTING SITE**    [place] (backside of the building) You are not required to quarantine, however you are required to wear a well-fitted mask when you are out and around people not in your household.  Hand Hygiene often Do NOT share personal items Notify your provider if you are in close contact with someone who has COVID or you develop fever 100.4 or greater, new onset of sneezing, cough, sore throat, shortness of breath or body aches.  [address] [place], [address], must go inside of the hospital, NOT A DRIVE THRU!  (Must self quarantine after testing. Follow instructions on handout.)       Your procedure is scheduled on:  09/13/21   Report to [facility] Main Entrance   Report to Short Stay at 5:15 AM   [facility])   Call this number if you have problems the morning of surgery [phone]   Do not eat food :After Midnight.   May have liquids until: 4:15 AM  day of surgery  CLEAR LIQUID DIET  Foods Allowed                                                                     Foods Excluded  Water, Black Coffee and  tea, regular and decaf                             liquids that you cannot  Plain Jell-O in any flavor  (No red)                                           see through such as: Fruit ices (not with fruit pulp)                                     milk, soups, orange juice              Iced Popsicles (No red)  All solid food                                   Apple juices Sports drinks like Gatorade (No red) Lightly seasoned clear broth or consume(fat free) Sugar,  Sample Menu Breakfast                                Lunch                                     Supper Cranberry juice                    Beef broth                            Chicken broth Jell-O                                     Grape juice                           Apple juice Coffee or tea                        Jell-O                                      Popsicle                                                Coffee or tea                        Coffee or tea      Complete one G2 drink the morning of surgery at : 4:15 AM   the day of surgery.     The day of surgery:  Drink ONE (1) Pre-Surgery Clear Ensure or G2 by am the morning of surgery. Drink in one sitting. Do not sip.  This drink was given to you during your hospital  pre-op appointment visit. Nothing else to drink after completing the  Pre-Surgery Clear Ensure or G2.          If you have questions, please contact your surgeon's office.     Oral Hygiene is also important to reduce your risk of infection.                                    Remember - BRUSH YOUR TEETH THE MORNING OF SURGERY WITH YOUR REGULAR TOOTHPASTE   Do NOT smoke after Midnight   Take these medicines the morning of surgery with A SIP OF WATER: gabapentin,duloxetine,cetirizine.  How to Manage Your Diabetes Before and After Surgery  Why is it important to control my blood sugar before and after surgery? Improving blood sugar levels before and after  surgery  helps healing and can limit problems. A way of improving blood sugar control is eating a healthy diet by:  Eating less sugar and carbohydrates  Increasing activity/exercise  Talking with your doctor about reaching your blood sugar goals High blood sugars (greater than 180 mg/dL) can raise your risk of infections and slow your recovery, so you will need to focus on controlling your diabetes during the weeks before surgery. Make sure that the doctor who takes care of your diabetes knows about your planned surgery including the date and location.  How do I manage my blood sugar before surgery? Check your blood sugar at least 4 times a day, starting 2 days before surgery, to make sure that the level is not too high or low. Check your blood sugar the morning of your surgery when you wake up and every 2 hours until you get to the Short Stay unit. If your blood sugar is less than 70 mg/dL, you will need to treat for low blood sugar: Do not take insulin. Treat a low blood sugar (less than 70 mg/dL) with  cup of clear juice (cranberry or apple), 4 glucose tablets, OR glucose gel. Recheck blood sugar in 15 minutes after treatment (to make sure it is greater than 70 mg/dL). If your blood sugar is not greater than 70 mg/dL on recheck, call [phone] for further instructions. Report your blood sugar to the short stay nurse when you get to Short Stay.  If you are admitted to the hospital after surgery: Your blood sugar will be checked by the staff and you will probably be given insulin after surgery (instead of oral diabetes medicines) to make sure you have good blood sugar levels. The goal for blood sugar control after surgery is 80-180 mg/dL.   WHAT DO I DO ABOUT MY DIABETES MEDICATION?  Do not take oral diabetes medicines (pills) the morning of surgery.  THE DAY BEFORE SURGERY, take glipizide in AM.             THE MORNING OF SURGERY, DO NOT TAKE ANY ORAL DIABETIC MEDICATIONS DAY OF YOUR  SURGERY  The day of surgery, do not take other diabetes injectables, including Byetta (exenatide), Bydureon (exenatide ER), Victoza (liraglutide), or Trulicity (dulaglutide).                              You may not have any metal on your body including hair pins, jewelry, and body piercing             Do not wear make-up, lotions, powders, perfumes/cologne, or deodorant  Do not wear nail polish including gel and S&S, artificial/acrylic nails, or any other type of covering on natural nails including finger and toenails. If you have artificial nails, gel coating, etc. that needs to be removed by a nail salon please have this removed prior to surgery or surgery may need to be canceled/ delayed if the surgeon/ anesthesia feels like they are unable to be safely monitored.   Do not shave  48 hours prior to surgery.   Do not bring valuables to the hospital. [place].   Contacts, dentures or bridgework may not be worn into surgery.   Bring small overnight bag day of surgery.    Patients discharged on the day of surgery will not be allowed to drive home.  Special Instructions: Bring a copy of your healthcare power of attorney and living will documents         the day of surgery if you haven't scanned them before.              Please read over the following fact sheets you were given: IF YOU HAVE QUESTIONS ABOUT YOUR PRE-OP INSTRUCTIONS PLEASE CALL [phone]   [facility] - Preparing for Surgery Before surgery, you can play an important role.  Because skin is not sterile, your skin needs to be as free of germs as possible.  You can reduce the number of germs on your skin by washing with CHG (chlorahexidine gluconate) soap before surgery.  CHG is an antiseptic cleaner which kills germs and bonds with the skin to continue killing germs even after washing. Please DO NOT use if you have an allergy to CHG or antibacterial soaps.  If your skin becomes  reddened/irritated stop using the CHG and inform your nurse when you arrive at Short Stay. Do not shave (including legs and underarms) for at least 48 hours prior to the first CHG shower.  You may shave your face/neck. Please follow these instructions carefully:  1.  Shower with CHG Soap the night before surgery and the  morning of Surgery.  2.  If you choose to wash your hair, wash your hair first as usual with your  normal  shampoo.  3.  After you shampoo, rinse your hair and body thoroughly to remove the  shampoo.                           4.  Use CHG as you would any other liquid soap.  You can apply chg directly  to the skin and wash                       Gently with a scrungie or clean washcloth.  5.  Apply the CHG Soap to your body ONLY FROM THE NECK DOWN.   Do not use on face/ open                           Wound or open sores. Avoid contact with eyes, ears mouth and genitals (private parts).                       Wash face,  Genitals (private parts) with your normal soap.             6.  Wash thoroughly, paying special attention to the area where your surgery  will be performed.  7.  Thoroughly rinse your body with warm water from the neck down.  8.  DO NOT shower/wash with your normal soap after using and rinsing off  the CHG Soap.                9.  Pat yourself dry with a clean towel.            10.  Wear clean pajamas.            11.  Place clean sheets on your bed the night of your first shower and do not  sleep with pets. Day of Surgery : Do not apply any lotions/deodorants the morning of surgery.  Please wear clean clothes to the hospital/surgery center.  FAILURE TO FOLLOW THESE INSTRUCTIONS MAY RESULT IN THE  CANCELLATION OF YOUR SURGERY PATIENT SIGNATURE_________________________________  NURSE SIGNATURE__________________________________  ________________________________________________________________________   Adam Phenix  An incentive spirometer is a tool that  can help keep your lungs clear and active. This tool measures how well you are filling your lungs with each breath. Taking long deep breaths may help reverse or decrease the chance of developing breathing (pulmonary) problems (especially infection) following: A long period of time when you are unable to move or be active. BEFORE THE PROCEDURE  If the spirometer includes an indicator to show your best effort, your nurse or respiratory therapist will set it to a desired goal. If possible, sit up straight or lean slightly forward. Try not to slouch. Hold the incentive spirometer in an upright position. INSTRUCTIONS FOR USE  Sit on the edge of your bed if possible, or sit up as far as you can in bed or on a chair. Hold the incentive spirometer in an upright position. Breathe out normally. Place the mouthpiece in your mouth and seal your lips tightly around it. Breathe in slowly and as deeply as possible, raising the piston or the ball toward the top of the column. Hold your breath for 3-5 seconds or for as long as possible. Allow the piston or ball to fall to the bottom of the column. Remove the mouthpiece from your mouth and breathe out normally. Rest for a few seconds and repeat Steps 1 through 7 at least 10 times every 1-2 hours when you are awake. Take your time and take a few normal breaths between deep breaths. The spirometer may include an indicator to show your best effort. Use the indicator as a goal to work toward during each repetition. After each set of 10 deep breaths, practice coughing to be sure your lungs are clear. If you have an incision (the cut made at the time of surgery), support your incision when coughing by placing a pillow or rolled up towels firmly against it. Once you are able to get out of bed, walk around indoors and cough well. You may stop using the incentive spirometer when instructed by your caregiver.  RISKS AND COMPLICATIONS Take your time so you do not get dizzy or  light-headed. If you are in pain, you may need to take or ask for pain medication before doing incentive spirometry. It is harder to take a deep breath if you are having pain. AFTER USE Rest and breathe slowly and easily. It can be helpful to keep track of a log of your progress. Your caregiver can provide you with a simple table to help with this. If you are using the spirometer at home, follow these instructions: [place] IF:  You are having difficultly using the spirometer. You have trouble using the spirometer as often as instructed. Your pain medication is not giving enough relief while using the spirometer. You develop fever of 100.5 F (38.1 C) or higher. SEEK IMMEDIATE MEDICAL CARE IF:  You cough up bloody sputum that had not been present before. You develop fever of 102 F (38.9 C) or greater. You develop worsening pain at or near the incision site. MAKE SURE YOU:  Understand these instructions. Will watch your condition. Will get help right away if you are not doing well or get worse. Document Released: 03/13/2007 Document Revised: 01/23/2012 Document Reviewed: 05/14/2007 [facility] Patient Information 2014 [place], [place].   ________________________________________________________________________

## 2021-08-31 ENCOUNTER — Other Ambulatory Visit: Payer: Self-pay

## 2021-08-31 ENCOUNTER — Encounter (HOSPITAL_COMMUNITY)
Admission: RE | Admit: 2021-08-31 | Discharge: 2021-08-31 | Disposition: A | Discharge status: Home / Self Care | Payer: 59 | Source: Ambulatory Visit | Attending: Orthopedic Surgery | Admitting: Orthopedic Surgery

## 2021-08-31 ENCOUNTER — Encounter (HOSPITAL_COMMUNITY): Payer: Self-pay

## 2021-08-31 DIAGNOSIS — Z7984 Long term (current) use of oral hypoglycemic drugs: Secondary | ICD-10-CM | POA: Diagnosis not present

## 2021-08-31 DIAGNOSIS — Z8616 Personal history of COVID-19: Secondary | ICD-10-CM | POA: Diagnosis not present

## 2021-08-31 DIAGNOSIS — Z7982 Long term (current) use of aspirin: Secondary | ICD-10-CM | POA: Insufficient documentation

## 2021-08-31 DIAGNOSIS — E119 Type 2 diabetes mellitus without complications: Secondary | ICD-10-CM | POA: Diagnosis not present

## 2021-08-31 DIAGNOSIS — M1711 Unilateral primary osteoarthritis, right knee: Secondary | ICD-10-CM | POA: Diagnosis not present

## 2021-08-31 DIAGNOSIS — K219 Gastro-esophageal reflux disease without esophagitis: Secondary | ICD-10-CM | POA: Insufficient documentation

## 2021-08-31 DIAGNOSIS — Z01812 Encounter for preprocedural laboratory examination: Secondary | ICD-10-CM | POA: Insufficient documentation

## 2021-08-31 DIAGNOSIS — I10 Essential (primary) hypertension: Secondary | ICD-10-CM | POA: Diagnosis not present

## 2021-08-31 DIAGNOSIS — Z7985 Long-term (current) use of injectable non-insulin antidiabetic drugs: Secondary | ICD-10-CM | POA: Diagnosis not present

## 2021-08-31 DIAGNOSIS — Z79890 Hormone replacement therapy (postmenopausal): Secondary | ICD-10-CM | POA: Insufficient documentation

## 2021-08-31 DIAGNOSIS — Z96652 Presence of left artificial knee joint: Secondary | ICD-10-CM | POA: Diagnosis not present

## 2021-08-31 DIAGNOSIS — Z79891 Long term (current) use of opiate analgesic: Secondary | ICD-10-CM | POA: Insufficient documentation

## 2021-08-31 DIAGNOSIS — Z79899 Other long term (current) drug therapy: Secondary | ICD-10-CM | POA: Insufficient documentation

## 2021-08-31 HISTORY — DX: Unspecified asthma, uncomplicated: J45.909

## 2021-08-31 LAB — COMPREHENSIVE METABOLIC PANEL
ALT: 26 U/L (ref 0–44)
AST: 17 U/L (ref 15–41)
Albumin: 4.2 g/dL (ref 3.5–5.0)
Alkaline Phosphatase: 70 U/L (ref 38–126)
Anion gap: 8 (ref 5–15)
BUN: 17 mg/dL (ref 6–20)
CO2: 27 mmol/L (ref 22–32)
Calcium: 9.6 mg/dL (ref 8.9–10.3)
Chloride: 103 mmol/L (ref 98–111)
Creatinine, Ser: 0.48 mg/dL (ref 0.44–1.00)
GFR, Estimated: >60 mL/min (ref >60)
Glucose, Bld: 113 mg/dL — ABNORMAL HIGH (ref 70–99)
Potassium: 4.2 mmol/L (ref 3.5–5.1)
Sodium: 138 mmol/L (ref 135–145)
Total Bilirubin: 0.5 mg/dL (ref 0.3–1.2)
Total Protein: 7.8 g/dL (ref 6.5–8.1)

## 2021-08-31 LAB — CBC
HCT: 43.7 % (ref 36.0–46.0)
Hemoglobin: 13.9 g/dL (ref 12.0–15.0)
MCH: 27.7 pg (ref 26.0–34.0)
MCHC: 31.8 g/dL (ref 30.0–36.0)
MCV: 87.1 fL (ref 80.0–100.0)
Platelets: 347 10*3/uL (ref 150–400)
RBC: 5.02 MIL/uL (ref 3.87–5.11)
RDW: 13.8 % (ref 11.5–15.5)
WBC: 14.4 10*3/uL — ABNORMAL HIGH (ref 4.0–10.5)
nRBC: 0 % (ref 0.0–0.2)

## 2021-08-31 LAB — HEMOGLOBIN A1C
Hgb A1c MFr Bld: 5.6 % (ref 4.8–5.6)
Mean Plasma Glucose: 114.02 mg/dL

## 2021-08-31 LAB — SURGICAL PCR SCREEN
MRSA, PCR: NEGATIVE
Staphylococcus aureus: NEGATIVE

## 2021-08-31 LAB — PROTIME-INR
INR: 0.9 (ref 0.8–1.2)
Prothrombin Time: 12.6 seconds (ref 11.4–15.2)

## 2021-08-31 LAB — GLUCOSE, CAPILLARY: Glucose-Capillary: 116 mg/dL — ABNORMAL HIGH (ref 70–99)

## 2021-08-31 NOTE — Progress Notes (Addendum)
COVID Vaccine Completed: Yes Date COVID Vaccine completed: 2021 x 3 COVID vaccine manufacturer: [place] Test: N/A. Pt. Tested positive for COVID on 07/17/21. : Home test.  PCP - Dr. Allyn Kenner Cardiologist -   Chest x-ray -  EKG - 06/17/21 Stress Test -  ECHO -  Cardiac Cath -  Pacemaker/ICD device last checked:  Sleep Study - Yes CPAP - NO  Fasting Blood Sugar - N/A Checks Blood Sugar ___0__ times a day  Blood Thinner Instructions: Aspirin Instructions: Last Dose:  Anesthesia review: Hx: HTN,DIA,OSA(NO CPAP),CAD  Patient denies shortness of breath, fever, cough and chest pain at PAT appointment   Patient verbalized understanding of instructions that were given to them at the PAT appointment. Patient was also instructed that they will need to review over the PAT instructions again at home before surgery.

## 2021-08-31 NOTE — Progress Notes (Addendum)
Pt. Was informed that she needs to contact her PCP about her COVID positive home test results on 08/13/21. She also knows that we need a note from her PCP about that positive results.PAT's fax and phone number were given to pt.She verbalized her understanding of the protocol. Note from PCP was received and placed in the chart.

## 2021-09-02 ENCOUNTER — Encounter (HOSPITAL_COMMUNITY): Payer: Self-pay

## 2021-09-02 NOTE — Progress Notes (Addendum)
Anesthesia Chart Review:  Case: [id number] Date/Time: 09/13/21 0700   Procedure: TOTAL KNEE ARTHROPLASTY (Right: Knee)   Anesthesia type: Choice   Pre-op diagnosis: right knee osteoarthritis   [facility] ROOM 09 / [facility]   Surgeons: Gaynelle Arabian, MD       DISCUSSION: Pt is 60 years old with hx HTN, GERD, DM II  CAD was listed in hx but it appears to have been erroneously entered. I can find no support for this dx before and at the time it was listed in hx. I have removed it from her [place] list.   Pt underwent L TKA 06/21/21 without anesthesia complication  Pt had COVID 08/13/21 (documentation in paper chart)    VS: BP (!) 144/88   Pulse 68   Temp 36.8 C (Oral)   Ht 5\' 6"  (1.676 m)   Wt 109.3 kg   LMP 03/27/2014 Comment: irregular  SpO2 94%   BMI 38.90 kg/m   PROVIDERS: - PCP is Celene Squibb, MD - Saw cardiologist Jenkins Rouge, MD in 2018 for atypical chest pain. Had normal stress test. Doesn't not regularly f/u with cardiology   LABS:  (all labs ordered are listed, but only abnormal results are displayed)  Labs Reviewed  CBC - Abnormal; Notable for the following components:      Result Value   WBC 14.4 (*)    All other components within normal limits  COMPREHENSIVE METABOLIC PANEL - Abnormal; Notable for the following components:   Glucose, Bld 113 (*)    All other components within normal limits  GLUCOSE, CAPILLARY - Abnormal; Notable for the following components:   Glucose-Capillary 116 (*)    All other components within normal limits  SURGICAL PCR SCREEN  HEMOGLOBIN A1C  PROTIME-INR    EKG 06/17/21: NSR. Nonspecific T wave abnormality   CV: Stress Test 02/24/2017 The study is normal. No myocardial ischemia or scar. This is a low risk study. Nuclear stress EF: 76%. Diffuse nonspecific ST segment abnormalities throughout study.   Past Medical History:  Diagnosis Date   Anxiety    Arthritis    Asthma    Complication of anesthesia    Depression     Diabetes mellitus without complication (HCC)    Diverticulitis    Fibromyalgia    GERD (gastroesophageal reflux disease)    History of kidney stones    Hypertension    Hypothyroidism    IBS (irritable bowel syndrome)    Pneumonia    PONV (postoperative nausea and vomiting)    Sleep apnea    Thyroid disease     Past Surgical History:  Procedure Laterality Date   AGILE CAPSULE N/A 11/23/2016   Procedure: AGILE CAPSULE;  Surgeon: Rogene Houston, MD;  [facility];  Service: Endoscopy;  Laterality: N/A;  8:30   BREAST EXCISIONAL BIOPSY Right    CARPAL TUNNEL RELEASE     CESAREAN SECTION     CHOLECYSTECTOMY     COLONOSCOPY N/A 08/28/2014   Procedure: COLONOSCOPY;  Surgeon: Rogene Houston, MD;  [facility];  Service: Endoscopy;  Laterality: N/A;  1030   CYST REMOVAL HAND     ESOPHAGOGASTRODUODENOSCOPY N/A 03/19/2016   Procedure: ESOPHAGOGASTRODUODENOSCOPY (EGD);  Surgeon: Rogene Houston, MD;  [facility];  Service: Endoscopy;  Laterality: N/A;   FLEXIBLE SIGMOIDOSCOPY N/A 09/28/2017   Procedure: FLEXIBLE SIGMOIDOSCOPY;  Surgeon: Rogene Houston, MD;  [facility];  Service: Endoscopy;  Laterality: N/A;  FRACTURE SURGERY     rt ankle    GIVENS CAPSULE STUDY N/A 12/15/2016   Procedure: GIVENS CAPSULE STUDY;  Surgeon: Rogene Houston, MD;  [facility];  Service: Endoscopy;  Laterality: N/A;   KNEE SURGERY     meniscus repair   LAPAROSCOPIC PARTIAL COLECTOMY N/A 04/22/2019   Procedure: LAPAROSCOPIC PARTIAL SIGMOID COLECTOMY;  Surgeon: Virl Cagey, MD;  Location: [facility];  Service: General;  Laterality: N/A;   TOTAL KNEE ARTHROPLASTY Left 06/21/2021   Procedure: TOTAL KNEE ARTHROPLASTY;  Surgeon: Gaynelle Arabian, MD;  Location[facility];  Service: Orthopedics;  Laterality: Left;   TUBAL LIGATION      MEDICATIONS:  acetaminophen (TYLENOL) 650 MG CR tablet   ALPRAZolam (XANAX) 0.5 MG tablet   Ascorbic Acid (VITAMIN C PO)    aspirin EC 81 MG tablet   cetirizine (ZYRTEC) 10 MG tablet   Cholecalciferol (VITAMIN D) 125 MCG (5000 UT) CAPS   diclofenac (VOLTAREN) 75 MG EC tablet   docusate sodium (COLACE) 100 MG capsule   DULoxetine (CYMBALTA) 60 MG capsule   gabapentin (NEURONTIN) 300 MG capsule   glipiZIDE (GLUCOTROL XL) 10 MG 24 hr tablet   levothyroxine (SYNTHROID, LEVOTHROID) 100 MCG tablet   lisinopril-hydrochlorothiazide (ZESTORETIC) 20-12.5 MG tablet   loperamide (IMODIUM) 2 MG capsule   methocarbamol (ROBAXIN) 500 MG tablet   Misc Natural Products (GLUCOSAMINE CHOND MSM FORMULA) TABS   Multiple Vitamin (MULTIVITAMIN WITH MINERALS) TABS tablet   naphazoline-pheniramine (ALLERGY EYE) 0.025-0.3 % ophthalmic solution   Omega-3 Fatty Acids (FISH OIL) 1000 MG CAPS   omeprazole (PRILOSEC OTC) 20 MG tablet   ondansetron (ZOFRAN) 4 MG tablet   oxyCODONE (OXY IR/ROXICODONE) 5 MG immediate release tablet   Semaglutide (OZEMPIC, 0.25 OR 0.5 MG/DOSE, [place])   valACYclovir (VALTREX) 1000 MG tablet   zinc gluconate 50 MG tablet   No current facility-administered medications for this encounter.   If no changes, I anticipate pt can proceed with surgery as scheduled.   Willeen Cass, PhD, FNP-BC [facility] Short Stay Surgical Center/Anesthesiology Phone: [phone] 09/02/2021 12:15 PM

## 2021-09-02 NOTE — Anesthesia Preprocedure Evaluation (Addendum)
Anesthesia Evaluation  Patient identified by MRN, date of birth, ID band Patient awake    Reviewed: Allergy & Precautions, NPO status , Patient's Chart, lab work & pertinent test results  History of Anesthesia Complications Negative for: history of anesthetic complications  Airway Mallampati: III  TM Distance: >3 FB Neck ROM: Full    Dental no notable dental hx. (+) Teeth Intact, Dental Advisory Given   Pulmonary asthma (hasnt had to use inhaler in years) , sleep apnea (CPAP on back order) and Continuous Positive Airway Pressure Ventilation ,    Pulmonary exam normal breath sounds clear to auscultation       Cardiovascular hypertension, Pt. on medications Normal cardiovascular exam Rhythm:Regular Rate:Normal     Neuro/Psych PSYCHIATRIC DISORDERS Anxiety Depression negative neurological ROS     GI/Hepatic Neg liver ROS, GERD  Medicated and Controlled,  Endo/Other  diabetes, Well Controlled, Type 2, Oral Hypoglycemic AgentsHypothyroidism obesity BMI 39 a1c 5.6  Renal/GU negative Renal ROS  negative genitourinary   Musculoskeletal  (+) Arthritis , Osteoarthritis,  Fibromyalgia -R knee OA   Abdominal (+) + obese,   Peds  Hematology negative hematology ROS (+) hct 43.7, plt 347   Anesthesia Other Findings   Reproductive/Obstetrics negative OB ROS                           Anesthesia Physical Anesthesia Plan  ASA: 3  Anesthesia Plan: MAC, Regional and Spinal   Post-op Pain Management:  Regional for Post-op pain   Induction:   PONV Risk Score and Plan: 2 and Propofol infusion and TIVA  Airway Management Planned: Natural Airway and Simple Face Mask  Additional Equipment: None  Intra-op Plan:   Post-operative Plan:   Informed Consent: I have reviewed the patients History and Physical, chart, labs and discussed the procedure including the risks, benefits and alternatives for the  proposed anesthesia with the patient or authorized representative who has indicated his/her understanding and acceptance.     Dental advisory given  Plan Discussed with: CRNA  Anesthesia Plan Comments:       Anesthesia Quick Evaluation

## 2021-09-12 ENCOUNTER — Encounter (HOSPITAL_COMMUNITY): Payer: Self-pay | Admitting: Orthopedic Surgery

## 2021-09-13 ENCOUNTER — Encounter (HOSPITAL_COMMUNITY)
Admission: RE | Disposition: A | Discharge status: Home / Self Care | Payer: Self-pay | Source: Ambulatory Visit | Attending: Orthopedic Surgery

## 2021-09-13 ENCOUNTER — Observation Stay (HOSPITAL_COMMUNITY)
Admission: RE | Admit: 2021-09-13 | Discharge: 2021-09-14 | Disposition: A | Discharge status: Home / Self Care | Payer: 59 | Source: Ambulatory Visit | Attending: Orthopedic Surgery | Admitting: Orthopedic Surgery

## 2021-09-13 ENCOUNTER — Ambulatory Visit (HOSPITAL_COMMUNITY): Payer: 59 | Admitting: Emergency Medicine

## 2021-09-13 ENCOUNTER — Ambulatory Visit (HOSPITAL_COMMUNITY): Payer: 59 | Admitting: Anesthesiology

## 2021-09-13 ENCOUNTER — Encounter (HOSPITAL_COMMUNITY): Payer: Self-pay | Admitting: Orthopedic Surgery

## 2021-09-13 ENCOUNTER — Other Ambulatory Visit: Payer: Self-pay

## 2021-09-13 DIAGNOSIS — I10 Essential (primary) hypertension: Secondary | ICD-10-CM | POA: Diagnosis not present

## 2021-09-13 DIAGNOSIS — I2510 Atherosclerotic heart disease of native coronary artery without angina pectoris: Secondary | ICD-10-CM | POA: Diagnosis not present

## 2021-09-13 DIAGNOSIS — M179 Osteoarthritis of knee, unspecified: Secondary | ICD-10-CM

## 2021-09-13 DIAGNOSIS — Z96652 Presence of left artificial knee joint: Secondary | ICD-10-CM | POA: Diagnosis not present

## 2021-09-13 DIAGNOSIS — M1711 Unilateral primary osteoarthritis, right knee: Secondary | ICD-10-CM | POA: Diagnosis not present

## 2021-09-13 DIAGNOSIS — E119 Type 2 diabetes mellitus without complications: Secondary | ICD-10-CM | POA: Diagnosis not present

## 2021-09-13 DIAGNOSIS — Z79899 Other long term (current) drug therapy: Secondary | ICD-10-CM | POA: Insufficient documentation

## 2021-09-13 DIAGNOSIS — J45909 Unspecified asthma, uncomplicated: Secondary | ICD-10-CM | POA: Insufficient documentation

## 2021-09-13 DIAGNOSIS — E039 Hypothyroidism, unspecified: Secondary | ICD-10-CM | POA: Insufficient documentation

## 2021-09-13 HISTORY — PX: TOTAL KNEE ARTHROPLASTY: SHX125

## 2021-09-13 LAB — GLUCOSE, CAPILLARY
Glucose-Capillary: 109 mg/dL — ABNORMAL HIGH (ref 70–99)
Glucose-Capillary: 98 mg/dL (ref 70–99)
Glucose-Capillary: 175 mg/dL — ABNORMAL HIGH (ref 70–99)
Glucose-Capillary: 190 mg/dL — ABNORMAL HIGH (ref 70–99)

## 2021-09-13 SURGERY — ARTHROPLASTY, KNEE, TOTAL
Anesthesia: Monitor Anesthesia Care | Site: Knee | Laterality: Right

## 2021-09-13 MED ORDER — POLYETHYLENE GLYCOL 3350 17 G PO PACK: 17.0000 g | PACK | Freq: Every day | ORAL | Status: DC | PRN

## 2021-09-13 MED ORDER — PROPOFOL 10 MG/ML IV BOLUS
INTRAVENOUS | Status: AC
  Filled 2021-09-13: qty 20

## 2021-09-13 MED ORDER — PROPOFOL 500 MG/50ML IV EMUL
INTRAVENOUS | Status: DC | PRN
  Administered 2021-09-13: 80 ug/kg/min via INTRAVENOUS

## 2021-09-13 MED ORDER — BUPIVACAINE LIPOSOME 1.3 % IJ SUSP
INTRAMUSCULAR | Status: AC
  Filled 2021-09-13: qty 20

## 2021-09-13 MED ORDER — FENTANYL CITRATE (PF) 100 MCG/2ML IJ SOLN
INTRAMUSCULAR | Status: DC | PRN
  Administered 2021-09-13 (×2): 25 ug via INTRAVENOUS
  Administered 2021-09-13: 50 ug via INTRAVENOUS

## 2021-09-13 MED ORDER — LACTATED RINGERS IV SOLN: INTRAVENOUS | Status: DC

## 2021-09-13 MED ORDER — ONDANSETRON HCL 4 MG/2ML IJ SOLN: 4.0000 mg | Freq: Four times a day (QID) | INTRAMUSCULAR | Status: DC | PRN

## 2021-09-13 MED ORDER — SODIUM CHLORIDE 0.9 % IV SOLN: INTRAVENOUS | Status: DC

## 2021-09-13 MED ORDER — CHLORHEXIDINE GLUCONATE CLOTH 2 % EX PADS: 6.0000 | MEDICATED_PAD | Freq: Every day | CUTANEOUS | Status: DC

## 2021-09-13 MED ORDER — HYDROMORPHONE HCL 1 MG/ML IJ SOLN: 0.2500 mg | INTRAMUSCULAR | Status: DC | PRN

## 2021-09-13 MED ORDER — DEXAMETHASONE SODIUM PHOSPHATE 10 MG/ML IJ SOLN
INTRAMUSCULAR | Status: AC
  Filled 2021-09-13: qty 4

## 2021-09-13 MED ORDER — AMISULPRIDE (ANTIEMETIC) 5 MG/2ML IV SOLN: 10.0000 mg | Freq: Once | INTRAVENOUS | Status: DC | PRN

## 2021-09-13 MED ORDER — DEXAMETHASONE SODIUM PHOSPHATE 10 MG/ML IJ SOLN: 8.0000 mg | Freq: Once | INTRAMUSCULAR | Status: DC

## 2021-09-13 MED ORDER — STERILE WATER FOR IRRIGATION IR SOLN
Status: DC | PRN
  Administered 2021-09-13: 2000 mL

## 2021-09-13 MED ORDER — ONDANSETRON HCL 4 MG/2ML IJ SOLN
INTRAMUSCULAR | Status: DC | PRN
  Administered 2021-09-13: 4 mg via INTRAVENOUS

## 2021-09-13 MED ORDER — METOCLOPRAMIDE HCL 5 MG PO TABS: 5.0000 mg | ORAL_TABLET | Freq: Three times a day (TID) | ORAL | Status: DC | PRN

## 2021-09-13 MED ORDER — CEFAZOLIN SODIUM-DEXTROSE 2-4 GM/100ML-% IV SOLN
2.0000 g | Freq: Four times a day (QID) | INTRAVENOUS | Status: AC
  Administered 2021-09-13 (×2): 2 g via INTRAVENOUS
  Filled 2021-09-13 (×2): qty 100

## 2021-09-13 MED ORDER — PANTOPRAZOLE SODIUM 40 MG PO TBEC
40.0000 mg | DELAYED_RELEASE_TABLET | Freq: Every day | ORAL | Status: DC
  Administered 2021-09-13 – 2021-09-14 (×2): 40 mg via ORAL
  Filled 2021-09-13 (×3): qty 1

## 2021-09-13 MED ORDER — KETOROLAC TROMETHAMINE 30 MG/ML IJ SOLN
INTRAMUSCULAR | Status: AC
  Filled 2021-09-13: qty 1

## 2021-09-13 MED ORDER — OXYCODONE HCL 5 MG PO TABS
ORAL_TABLET | ORAL | Status: AC
  Filled 2021-09-13: qty 1

## 2021-09-13 MED ORDER — SODIUM CHLORIDE 0.9 % IR SOLN
Status: DC | PRN
  Administered 2021-09-13: 1000 mL

## 2021-09-13 MED ORDER — PHENYLEPHRINE 40 MCG/ML (10ML) SYRINGE FOR IV PUSH (FOR BLOOD PRESSURE SUPPORT)
PREFILLED_SYRINGE | INTRAVENOUS | Status: DC | PRN
  Administered 2021-09-13: 80 ug via INTRAVENOUS

## 2021-09-13 MED ORDER — METOCLOPRAMIDE HCL 5 MG/ML IJ SOLN: 5.0000 mg | Freq: Three times a day (TID) | INTRAMUSCULAR | Status: DC | PRN

## 2021-09-13 MED ORDER — PHENOL 1.4 % MT LIQD: 1.0000 | OROMUCOSAL | Status: DC | PRN

## 2021-09-13 MED ORDER — DEXAMETHASONE SODIUM PHOSPHATE 10 MG/ML IJ SOLN
INTRAMUSCULAR | Status: DC | PRN
  Administered 2021-09-13: 10 mg

## 2021-09-13 MED ORDER — GLIPIZIDE ER 5 MG PO TB24
10.0000 mg | ORAL_TABLET | Freq: Every day | ORAL | Status: DC
  Administered 2021-09-14: 10 mg via ORAL
  Filled 2021-09-13: qty 2

## 2021-09-13 MED ORDER — SODIUM CHLORIDE (PF) 0.9 % IJ SOLN
INTRAMUSCULAR | Status: AC
  Filled 2021-09-13: qty 10

## 2021-09-13 MED ORDER — ASPIRIN EC 325 MG PO TBEC
325.0000 mg | DELAYED_RELEASE_TABLET | Freq: Two times a day (BID) | ORAL | Status: DC
  Administered 2021-09-14: 325 mg via ORAL
  Filled 2021-09-13: qty 1

## 2021-09-13 MED ORDER — FLEET ENEMA 7-19 GM/118ML RE ENEM: 1.0000 | ENEMA | Freq: Once | RECTAL | Status: DC | PRN

## 2021-09-13 MED ORDER — DULOXETINE HCL 60 MG PO CPEP
60.0000 mg | ORAL_CAPSULE | Freq: Two times a day (BID) | ORAL | Status: DC
  Administered 2021-09-14: 60 mg via ORAL
  Filled 2021-09-13: qty 1

## 2021-09-13 MED ORDER — BISACODYL 10 MG RE SUPP: 10.0000 mg | Freq: Every day | RECTAL | Status: DC | PRN

## 2021-09-13 MED ORDER — ONDANSETRON HCL 4 MG PO TABS: 4.0000 mg | ORAL_TABLET | Freq: Four times a day (QID) | ORAL | Status: DC | PRN

## 2021-09-13 MED ORDER — OXYCODONE HCL 5 MG PO TABS
5.0000 mg | ORAL_TABLET | Freq: Once | ORAL | Status: AC | PRN
  Administered 2021-09-13: 5 mg via ORAL

## 2021-09-13 MED ORDER — DEXAMETHASONE SODIUM PHOSPHATE 10 MG/ML IJ SOLN
INTRAMUSCULAR | Status: DC | PRN
  Administered 2021-09-13: 4 mg via INTRAVENOUS

## 2021-09-13 MED ORDER — DIPHENHYDRAMINE HCL 12.5 MG/5ML PO ELIX: 12.5000 mg | ORAL_SOLUTION | ORAL | Status: DC | PRN

## 2021-09-13 MED ORDER — 0.9 % SODIUM CHLORIDE (POUR BTL) OPTIME
TOPICAL | Status: DC | PRN
  Administered 2021-09-13: 1000 mL

## 2021-09-13 MED ORDER — GABAPENTIN 300 MG PO CAPS
300.0000 mg | ORAL_CAPSULE | Freq: Three times a day (TID) | ORAL | Status: DC
  Administered 2021-09-13 – 2021-09-14 (×3): 300 mg via ORAL
  Filled 2021-09-13 (×3): qty 1

## 2021-09-13 MED ORDER — LEVOTHYROXINE SODIUM 100 MCG PO TABS
100.0000 ug | ORAL_TABLET | Freq: Every day | ORAL | Status: DC
  Administered 2021-09-14: 100 ug via ORAL
  Filled 2021-09-13: qty 1

## 2021-09-13 MED ORDER — ACETAMINOPHEN 500 MG PO TABS
1000.0000 mg | ORAL_TABLET | Freq: Four times a day (QID) | ORAL | Status: AC
  Administered 2021-09-13 – 2021-09-14 (×4): 1000 mg via ORAL
  Filled 2021-09-13 (×4): qty 2

## 2021-09-13 MED ORDER — POVIDONE-IODINE 10 % EX SWAB
2.0000 "application " | Freq: Once | CUTANEOUS | Status: AC
  Administered 2021-09-13: 2 via TOPICAL

## 2021-09-13 MED ORDER — ACETAMINOPHEN 10 MG/ML IV SOLN
1000.0000 mg | Freq: Four times a day (QID) | INTRAVENOUS | Status: DC
  Administered 2021-09-13: 1000 mg via INTRAVENOUS
  Filled 2021-09-13: qty 100

## 2021-09-13 MED ORDER — MENTHOL 3 MG MT LOZG: 1.0000 | LOZENGE | OROMUCOSAL | Status: DC | PRN

## 2021-09-13 MED ORDER — GLYCOPYRROLATE 0.2 MG/ML IJ SOLN
INTRAMUSCULAR | Status: AC
  Filled 2021-09-13: qty 2

## 2021-09-13 MED ORDER — BUPIVACAINE LIPOSOME 1.3 % IJ SUSP: 20.0000 mL | Freq: Once | INTRAMUSCULAR | Status: DC

## 2021-09-13 MED ORDER — ACETAMINOPHEN 500 MG PO TABS: 1000.0000 mg | ORAL_TABLET | Freq: Once | ORAL | Status: DC

## 2021-09-13 MED ORDER — MIDAZOLAM HCL 5 MG/5ML IJ SOLN
INTRAMUSCULAR | Status: DC | PRN
  Administered 2021-09-13: 2 mg via INTRAVENOUS

## 2021-09-13 MED ORDER — MORPHINE SULFATE (PF) 2 MG/ML IV SOLN
0.5000 mg | INTRAVENOUS | Status: DC | PRN
  Administered 2021-09-13: 1 mg via INTRAVENOUS
  Filled 2021-09-13: qty 1

## 2021-09-13 MED ORDER — ALPRAZOLAM 1 MG PO TABS
1.0000 mg | ORAL_TABLET | Freq: Every evening | ORAL | Status: DC | PRN
  Administered 2021-09-13: 1 mg via ORAL
  Filled 2021-09-13: qty 1

## 2021-09-13 MED ORDER — PHENYLEPHRINE HCL-NACL 20-0.9 MG/250ML-% IV SOLN
INTRAVENOUS | Status: AC
  Filled 2021-09-13: qty 500

## 2021-09-13 MED ORDER — KETOROLAC TROMETHAMINE 30 MG/ML IJ SOLN
30.0000 mg | Freq: Once | INTRAMUSCULAR | Status: AC | PRN
  Administered 2021-09-13: 30 mg via INTRAVENOUS

## 2021-09-13 MED ORDER — OXYCODONE HCL 5 MG/5ML PO SOLN
5.0000 mg | Freq: Once | ORAL | Status: AC | PRN
Start: 2021-09-13 — End: 2021-09-13

## 2021-09-13 MED ORDER — CHLORHEXIDINE GLUCONATE 0.12 % MT SOLN
15.0000 mL | Freq: Once | OROMUCOSAL | Status: AC
Start: 2021-09-13 — End: 2021-09-13
  Administered 2021-09-13: 15 mL via OROMUCOSAL

## 2021-09-13 MED ORDER — MIDAZOLAM HCL 2 MG/2ML IJ SOLN
INTRAMUSCULAR | Status: AC
  Filled 2021-09-13: qty 2

## 2021-09-13 MED ORDER — BUPIVACAINE IN DEXTROSE 0.75-8.25 % IT SOLN
INTRATHECAL | Status: DC | PRN
  Administered 2021-09-13: 1.6 mL via INTRATHECAL

## 2021-09-13 MED ORDER — DOCUSATE SODIUM 100 MG PO CAPS
100.0000 mg | ORAL_CAPSULE | Freq: Two times a day (BID) | ORAL | Status: DC
  Administered 2021-09-13 – 2021-09-14 (×2): 100 mg via ORAL
  Filled 2021-09-13 (×2): qty 1

## 2021-09-13 MED ORDER — METHOCARBAMOL 500 MG IVPB - SIMPLE MED
500.0000 mg | Freq: Four times a day (QID) | INTRAVENOUS | Status: DC | PRN
  Filled 2021-09-13: qty 50

## 2021-09-13 MED ORDER — CEFAZOLIN SODIUM-DEXTROSE 2-4 GM/100ML-% IV SOLN
2.0000 g | INTRAVENOUS | Status: AC
  Administered 2021-09-13: 2 g via INTRAVENOUS
  Filled 2021-09-13: qty 100

## 2021-09-13 MED ORDER — PROPOFOL 10 MG/ML IV BOLUS
INTRAVENOUS | Status: DC | PRN
  Administered 2021-09-13: 30 mg via INTRAVENOUS

## 2021-09-13 MED ORDER — OXYCODONE HCL 5 MG PO TABS
5.0000 mg | ORAL_TABLET | ORAL | Status: DC | PRN
Start: 2021-09-13 — End: 2021-09-14

## 2021-09-13 MED ORDER — BUPIVACAINE LIPOSOME 1.3 % IJ SUSP
INTRAMUSCULAR | Status: DC | PRN
  Administered 2021-09-13: 20 mL

## 2021-09-13 MED ORDER — FENTANYL CITRATE (PF) 100 MCG/2ML IJ SOLN
INTRAMUSCULAR | Status: AC
  Filled 2021-09-13: qty 2

## 2021-09-13 MED ORDER — ONDANSETRON HCL 4 MG/2ML IJ SOLN
INTRAMUSCULAR | Status: AC
  Filled 2021-09-13: qty 8

## 2021-09-13 MED ORDER — PROPOFOL 1000 MG/100ML IV EMUL
INTRAVENOUS | Status: AC
  Filled 2021-09-13: qty 300

## 2021-09-13 MED ORDER — HYDROMORPHONE HCL 1 MG/ML IJ SOLN
0.5000 mg | INTRAMUSCULAR | Status: DC | PRN
  Administered 2021-09-14: 1 mg via INTRAVENOUS
  Filled 2021-09-13: qty 1

## 2021-09-13 MED ORDER — TRANEXAMIC ACID-NACL 1000-0.7 MG/100ML-% IV SOLN
1000.0000 mg | INTRAVENOUS | Status: AC
  Administered 2021-09-13: 1000 mg via INTRAVENOUS
  Filled 2021-09-13: qty 100

## 2021-09-13 MED ORDER — OXYCODONE HCL 5 MG PO TABS
10.0000 mg | ORAL_TABLET | ORAL | Status: DC | PRN
  Administered 2021-09-13 – 2021-09-14 (×6): 10 mg via ORAL
  Filled 2021-09-13 (×6): qty 2

## 2021-09-13 MED ORDER — ORAL CARE MOUTH RINSE: 15.0000 mL | Freq: Once | OROMUCOSAL | Status: AC

## 2021-09-13 MED ORDER — ONDANSETRON HCL 4 MG/2ML IJ SOLN: 4.0000 mg | Freq: Once | INTRAMUSCULAR | Status: DC | PRN

## 2021-09-13 MED ORDER — INSULIN ASPART 100 UNIT/ML IJ SOLN
0.0000 [IU] | Freq: Three times a day (TID) | INTRAMUSCULAR | Status: DC
  Administered 2021-09-13: 3 [IU] via SUBCUTANEOUS

## 2021-09-13 MED ORDER — ROPIVACAINE HCL 5 MG/ML IJ SOLN
INTRAMUSCULAR | Status: DC | PRN
  Administered 2021-09-13: 30 mL via PERINEURAL

## 2021-09-13 MED ORDER — SODIUM CHLORIDE (PF) 0.9 % IJ SOLN
INTRAMUSCULAR | Status: DC | PRN
  Administered 2021-09-13: 60 mL

## 2021-09-13 MED ORDER — METHOCARBAMOL 500 MG PO TABS
500.0000 mg | ORAL_TABLET | Freq: Four times a day (QID) | ORAL | Status: DC | PRN
  Administered 2021-09-13 – 2021-09-14 (×4): 500 mg via ORAL
  Filled 2021-09-13 (×4): qty 1

## 2021-09-13 MED ORDER — INSULIN ASPART 100 UNIT/ML IJ SOLN: 0.0000 [IU] | Freq: Every day | INTRAMUSCULAR | Status: DC

## 2021-09-13 SURGICAL SUPPLY — 54 items
ATTUNE MED DOME PAT 38 KNEE (Knees) ×1 IMPLANT
ATTUNE PS FEM RT SZ 5 CEM KNEE (Femur) ×1 IMPLANT
ATTUNE PSRP INSR SZ5 8 KNEE (Insert) ×1 IMPLANT
BAG COUNTER SPONGE SURGICOUNT (BAG) ×1 IMPLANT
BAG SPEC THK2 15X12 ZIP CLS (MISCELLANEOUS) ×1
BAG SPNG CNTER NS LX DISP (BAG) ×1
BAG ZIPLOCK 12X15 (MISCELLANEOUS) ×2 IMPLANT
BASE TIBIAL ROT PLAT SZ 5 KNEE (Knees) IMPLANT
BLADE SAG 18X100X1.27 (BLADE) ×2 IMPLANT
BLADE SAW SGTL 11.0X1.19X90.0M (BLADE) ×2 IMPLANT
BNDG ELASTIC 6X5.8 VLCR STR LF (GAUZE/BANDAGES/DRESSINGS) ×2 IMPLANT
BOWL SMART MIX CTS (DISPOSABLE) ×2 IMPLANT
BSPLAT TIB 5 CMNT ROT PLAT STR (Knees) ×1 IMPLANT
CEMENT HV SMART SET (Cement) ×4 IMPLANT
COVER SURGICAL LIGHT HANDLE (MISCELLANEOUS) ×2 IMPLANT
CUFF TOURN SGL QUICK 34 (TOURNIQUET CUFF) ×2
CUFF TRNQT CYL 34X4.125X (TOURNIQUET CUFF) ×1 IMPLANT
DECANTER SPIKE VIAL GLASS SM (MISCELLANEOUS) ×2 IMPLANT
DRAPE INCISE IOBAN 66X45 STRL (DRAPES) ×2 IMPLANT
DRAPE U-SHAPE 47X51 STRL (DRAPES) ×2 IMPLANT
DRSG AQUACEL AG ADV 3.5X10 (GAUZE/BANDAGES/DRESSINGS) ×2 IMPLANT
DURAPREP 26ML APPLICATOR (WOUND CARE) ×2 IMPLANT
ELECT REM PT RETURN 15FT ADLT (MISCELLANEOUS) ×2 IMPLANT
GLOVE SRG 8 PF TXTR STRL LF DI (GLOVE) ×1 IMPLANT
GLOVE SURG ENC MOIS LTX SZ6.5 (GLOVE) ×2 IMPLANT
GLOVE SURG ENC MOIS LTX SZ8 (GLOVE) ×4 IMPLANT
GLOVE SURG UNDER POLY LF SZ7 (GLOVE) ×2 IMPLANT
GLOVE SURG UNDER POLY LF SZ8 (GLOVE) ×2
GLOVE SURG UNDER POLY LF SZ8.5 (GLOVE) ×2 IMPLANT
GOWN STRL REUS W/TWL LRG LVL3 (GOWN DISPOSABLE) ×4 IMPLANT
GOWN STRL REUS W/TWL XL LVL3 (GOWN DISPOSABLE) ×2 IMPLANT
HANDPIECE INTERPULSE COAX TIP (DISPOSABLE) ×2
HOLDER FOLEY CATH W/STRAP (MISCELLANEOUS) ×1 IMPLANT
IMMOBILIZER KNEE 20 (SOFTGOODS) ×2
IMMOBILIZER KNEE 20 THIGH 36 (SOFTGOODS) ×1 IMPLANT
KIT TURNOVER KIT A (KITS) ×1 IMPLANT
MANIFOLD NEPTUNE II (INSTRUMENTS) ×2 IMPLANT
NS IRRIG 1000ML POUR BTL (IV SOLUTION) ×1 IMPLANT
PACK TOTAL KNEE CUSTOM (KITS) ×2 IMPLANT
PADDING CAST COTTON 6X4 STRL (CAST SUPPLIES) ×3 IMPLANT
PROTECTOR NERVE ULNAR (MISCELLANEOUS) ×2 IMPLANT
SET HNDPC FAN SPRY TIP SCT (DISPOSABLE) ×1 IMPLANT
SPONGE T-LAP 18X18 ~~LOC~~+RFID (SPONGE) ×2 IMPLANT
STRIP CLOSURE SKIN 1/2X4 (GAUZE/BANDAGES/DRESSINGS) ×4 IMPLANT
SUT MNCRL AB 4-0 PS2 18 (SUTURE) ×2 IMPLANT
SUT STRATAFIX 0 PDS 27 VIOLET (SUTURE) ×2
SUT VIC AB 2-0 CT1 27 (SUTURE) ×6
SUT VIC AB 2-0 CT1 TAPERPNT 27 (SUTURE) ×3 IMPLANT
SUTURE STRATFX 0 PDS 27 VIOLET (SUTURE) ×1 IMPLANT
TIBIAL BASE ROT PLAT SZ 5 KNEE (Knees) ×2 IMPLANT
TRAY FOLEY MTR SLVR 14FR STAT (SET/KITS/TRAYS/PACK) ×1 IMPLANT
TUBE SUCTION HIGH CAP CLEAR NV (SUCTIONS) ×2 IMPLANT
WATER STERILE IRR 1000ML POUR (IV SOLUTION) ×2 IMPLANT
WRAP KNEE MAXI GEL POST OP (GAUZE/BANDAGES/DRESSINGS) ×2 IMPLANT

## 2021-09-13 NOTE — Evaluation (Signed)
Physical Therapy Evaluation Patient Details Name: Kristin Bailey MRN: [id number] DOB: 1961/04/22 Today's Date: 09/13/2021  History of Present Illness  Patient is 60 y.o. female s/p Rt TKA On 09/13/21 with PMH significant for OA, DM, depression, anxiety, fibromyalgia, GERD, HTN, hypothyroidism, Lt TKA on 06/21/21.   Clinical Impression  Kristin Bailey is a 60 y.o. female POD 0 s/p Rt TKA. Patient reports independence with mobility at baseline. Patient is now limited by functional impairments (see PT problem list below) and requires min assist for transfers and gait with RW. Patient was limited to stand step transfer bed>chair with RW and min assist due to hip weakness related to spinal block. Patient instructed in exercise to facilitate ROM and circulation to manage edema and reduce risk of DVT. Patient will benefit from continued skilled PT interventions to address impairments and progress towards PLOF. Acute PT will follow to progress mobility and stair training in preparation for safe discharge home.        Recommendations for follow up therapy are one component of a multi-disciplinary discharge planning process, led by the attending physician.  Recommendations may be updated based on patient status, additional functional criteria and insurance authorization.  Follow Up Recommendations Outpatient PT    Assistance Recommended at Discharge None  Functional Status Assessment Patient has had a recent decline in their functional status and demonstrates the ability to make significant improvements in function in a reasonable and predictable amount of time.  Equipment Recommendations  None recommended by PT    Recommendations for Other Services       Precautions / Restrictions Precautions Precautions: Fall Restrictions Weight Bearing Restrictions: No Other Position/Activity Restrictions: WBAT      Mobility  Bed Mobility Overal bed mobility: Needs Assistance Bed Mobility: Supine to  Sit     Supine to sit: Min guard     General bed mobility comments: pt using bed rail and extra time to sit up to EOB. guarding for safety.    Transfers Overall transfer level: Needs assistance Equipment used: Rolling walker (2 wheels) Transfers: Sit to/from [facility] Sit to Stand: Min assist Stand pivot transfers: Min assist         General transfer comment: cues for hand placement and assist to steady with standing. pt with slight hip weakness due to spinal block and min assist to steady with steps to move EOB>chair. further gait defferred due to weakness.    Ambulation/Gait                Stairs            Wheelchair Mobility    Modified Rankin (Stroke Patients Only)       Balance Overall balance assessment: Needs assistance Sitting-balance support: Feet supported Sitting balance-Leahy Scale: Good     Standing balance support: Reliant on assistive device for balance;During functional activity;Bilateral upper extremity supported Standing balance-Leahy Scale: Poor                               Pertinent Vitals/Pain Pain Assessment: 0-10 Pain Score: 2  Pain Location: Rt knee Pain Descriptors / Indicators: Aching;Discomfort Pain Intervention(s): Limited activity within patient's tolerance;Monitored during session;Repositioned    Home Living Family/patient expects to be discharged to:: Private residence Living Arrangements: Spouse/significant other;Other relatives Available Help at Discharge: Family Type of Home: House Home Access: Stairs to enter;Level entry (level from outside but then go up through sunroom) Entrance Stairs-Rails:  Right Entrance Stairs-Number of Steps: 3   Home Layout: One level Home Equipment: Tub bench;Grab bars - tub/shower Additional Comments: pt's daugther coming friday and staying until sunday. pt's friend will come stay to help her next week.    Prior Function Prior Level of Function :  Independent/Modified Independent                     Hand Dominance   Dominant Hand: Right    Extremity/Trunk Assessment   Upper Extremity Assessment Upper Extremity Assessment: Overall WFL for tasks assessed    Lower Extremity Assessment Lower Extremity Assessment: RLE deficits/detail RLE Deficits / Details: good quad activation, no extensor lag with SLR RLE Sensation: WNL RLE Coordination: WNL    Cervical / Trunk Assessment Cervical / Trunk Assessment: Normal  Communication   Communication: No difficulties  Cognition Arousal/Alertness: Awake/alert Behavior During Therapy: WFL for tasks assessed/performed Overall Cognitive Status: Within Functional Limits for tasks assessed                                          General Comments      Exercises Total Joint Exercises Ankle Circles/Pumps: AROM;Both;20 reps;Seated Quad Sets: AROM;Right;5 reps;Seated Heel Slides: AROM;Right;5 reps;Seated   Assessment/Plan    PT Assessment Patient needs continued PT services  PT Problem List Decreased strength;Decreased range of motion;Decreased activity tolerance;Decreased balance;Decreased mobility;Decreased knowledge of use of DME;Decreased knowledge of precautions;Pain       PT Treatment Interventions DME instruction;Gait training;Stair training;Functional mobility training;Therapeutic exercise;Therapeutic activities;Balance training;Patient/family education    PT Goals (Current goals can be found in the Care Plan section)  Acute Rehab PT Goals Patient Stated Goal: recover independence PT Goal Formulation: With patient Time For Goal Achievement: 09/20/21 Potential to Achieve Goals: Good    Frequency 7X/week   Barriers to discharge        Co-evaluation               AM-PAC PT "6 Clicks" Mobility  Outcome Measure Help needed turning from your back to your side while in a flat bed without using bedrails?: None Help needed moving from lying  on your back to sitting on the side of a flat bed without using bedrails?: A Little Help needed moving to and from a bed to a chair (including a wheelchair)?: A Little Help needed standing up from a chair using your arms (e.g., wheelchair or bedside chair)?: A Little Help needed to walk in hospital room?: A Little Help needed climbing 3-5 steps with a railing? : A Little 6 Click Score: 19    End of Session Equipment Utilized During Treatment: Gait belt Activity Tolerance: Patient tolerated treatment well Patient left: in chair;with call bell/phone within reach;with chair alarm set;with family/visitor present Nurse Communication: Mobility status PT Visit Diagnosis: Muscle weakness (generalized) (M62.81);Difficulty in walking, not elsewhere classified (R26.2);Pain Pain - Right/Left: Right Pain - part of body: Knee    Time: 1220-1246 PT Time Calculation (min) (ACUTE ONLY): 26 min   Charges:   PT Evaluation $PT Eval Low Complexity: 1 Low PT Treatments $Therapeutic Exercise: 8-22 mins        Verner Mould, DPT Acute Rehabilitation Services Office [phone] Pager [phone]   Jacques Navy 09/13/2021, 1:48 PM

## 2021-09-13 NOTE — Discharge Instructions (Signed)
 Kristin Aluisio, MD Total Joint Specialist [facility] [place] [address] [place], [place] [address] [phone]  TOTAL KNEE REPLACEMENT POSTOPERATIVE DIRECTIONS    Knee Rehabilitation, Guidelines Following Surgery  Results after knee surgery are often greatly improved when you follow the exercise, range of motion and muscle strengthening exercises prescribed by your doctor. Safety measures are also important to protect the knee from further injury. If any of these exercises cause you to have increased pain or swelling in your knee joint, decrease the amount until you are comfortable again and slowly increase them. If you have problems or questions, call your caregiver or physical therapist for advice.    BLOOD CLOT PREVENTION Take a 10 mg Xarelto once a day for three weeks following surgery. Then take an 81 mg Aspirin once a day for three weeks. Then discontinue Aspirin. You may resume your vitamins/supplements once you have discontinued the Xarelto. Do not take any NSAIDs (Advil, Aleve, Ibuprofen, Meloxicam, etc.) until you have discontinued the Xarelto.    HOME CARE INSTRUCTIONS  Remove items at home which could result in a fall. This includes throw rugs or furniture in walking pathways.  ICE to the affected knee as much as tolerated. Icing helps control swelling. If the swelling is well controlled you will be more comfortable and rehab easier. Continue to use ice on the knee for pain and swelling from surgery. You may notice swelling that will progress down to the foot and ankle. This is normal after surgery. Elevate the leg when you are not up walking on it.    Continue to use the breathing machine which will help keep your temperature down. It is common for your temperature to cycle up and down following surgery, especially at night when you are not up moving around and exerting yourself. The breathing machine keeps your lungs expanded and your temperature  down. Do not place pillow under the operative knee, focus on keeping the knee straight while resting  DIET You may resume your previous home diet once you are discharged from the hospital.  DRESSING / WOUND CARE / SHOWERING Keep your bulky bandage on for 2 days. On the third post-operative day you may remove the Ace bandage and gauze. There is a waterproof adhesive bandage on your skin which will stay in place until your first follow-up appointment. Once you remove this you will not need to place another bandage You may begin showering 3 days following surgery, but do not submerge the incision under water.  ACTIVITY For the first 5 days, the key is rest and control of pain and swelling Do your home exercises twice a day starting on post-operative day 3. On the days you go to physical therapy, just do the home exercises once that day. You should rest, ice and elevate the leg for 50 minutes out of every hour. Get up and walk/stretch for 10 minutes per hour. After 5 days you can increase your activity slowly as tolerated. Walk with your walker as instructed. Use the walker until you are comfortable transitioning to a cane. Walk with the cane in the opposite hand of the operative leg. You may discontinue the cane once you are comfortable and walking steadily. Avoid periods of inactivity such as sitting longer than an hour when not asleep. This helps prevent blood clots.  You may discontinue the knee immobilizer once you are able to perform a straight leg raise while lying down. You may resume a sexual relationship in one   month or when given the OK by your doctor.  You may return to work once you are cleared by your doctor.  Do not drive a car for 6 weeks or until released by your surgeon.  Do not drive while taking narcotics.  TED HOSE STOCKINGS Wear the elastic stockings on both legs for three weeks following surgery during the day. You may remove them at night for sleeping.  WEIGHT  BEARING Weight bearing as tolerated with assist device (walker, cane, etc) as directed, use it as long as suggested by your surgeon or therapist, typically at least 4-6 weeks.  POSTOPERATIVE CONSTIPATION PROTOCOL Constipation - defined medically as fewer than three stools per week and severe constipation as less than one stool per week.  One of the most common issues patients have following surgery is constipation.  Even if you have a regular bowel pattern at home, your normal regimen is likely to be disrupted due to multiple reasons following surgery.  Combination of anesthesia, postoperative narcotics, change in appetite and fluid intake all can affect your bowels.  In order to avoid complications following surgery, here are some recommendations in order to help you during your recovery period.  Colace (docusate) - Pick up an over-the-counter form of Colace or another stool softener and take twice a day as long as you are requiring postoperative pain medications.  Take with a full glass of water daily.  If you experience loose stools or diarrhea, hold the colace until you stool forms back up. If your symptoms do not get better within 1 week or if they get worse, check with your doctor. Dulcolax (bisacodyl) - Pick up over-the-counter and take as directed by the product packaging as needed to assist with the movement of your bowels.  Take with a full glass of water.  Use this product as needed if not relieved by Colace only.  MiraLax (polyethylene glycol) - Pick up over-the-counter to have on hand. MiraLax is a solution that will increase the amount of water in your bowels to assist with bowel movements.  Take as directed and can mix with a glass of water, juice, soda, coffee, or tea. Take if you go more than two days without a movement. Do not use MiraLax more than once per day. Call your doctor if you are still constipated or irregular after using this medication for 7 days in a row.  If you continue  to have problems with postoperative constipation, please contact the office for further assistance and recommendations.  If you experience "the worst abdominal pain ever" or develop nausea or vomiting, please contact the office immediatly for further recommendations for treatment.  ITCHING If you experience itching with your medications, try taking only a single pain pill, or even half a pain pill at a time.  You can also use Benadryl over the counter for itching or also to help with sleep.   MEDICATIONS See your medication summary on the "After Visit Summary" that the nursing staff will review with you prior to discharge.  You may have some home medications which will be placed on hold until you complete the course of blood thinner medication.  It is important for you to complete the blood thinner medication as prescribed by your surgeon.  Continue your approved medications as instructed at time of discharge.  PRECAUTIONS If you experience chest pain or shortness of breath - call 911 immediately for transfer to the hospital emergency department.  If you develop a fever greater that 101   F, purulent drainage from wound, increased redness or drainage from wound, foul odor from the wound/dressing, or calf pain - CONTACT YOUR SURGEON.                                                   FOLLOW-UP APPOINTMENTS Make sure you keep all of your appointments after your operation with your surgeon and caregivers. You should call the office at the above phone number and make an appointment for approximately two weeks after the date of your surgery or on the date instructed by your surgeon outlined in the "After Visit Summary".  RANGE OF MOTION AND STRENGTHENING EXERCISES  Rehabilitation of the knee is important following a knee injury or an operation. After just a few days of immobilization, the muscles of the thigh which control the knee become weakened and shrink (atrophy). Knee exercises are designed to build up  the tone and strength of the thigh muscles and to improve knee motion. Often times heat used for twenty to thirty minutes before working out will loosen up your tissues and help with improving the range of motion but do not use heat for the first two weeks following surgery. These exercises can be done on a training (exercise) mat, on the floor, on a table or on a bed. Use what ever works the best and is most comfortable for you Knee exercises include:  Leg Lifts - While your knee is still immobilized in a splint or cast, you can do straight leg raises. Lift the leg to 60 degrees, hold for 3 sec, and slowly lower the leg. Repeat 10-20 times 2-3 times daily. Perform this exercise against resistance later as your knee gets better.  Quad and Hamstring Sets - Tighten up the muscle on the front of the thigh (Quad) and hold for 5-10 sec. Repeat this 10-20 times hourly. Hamstring sets are done by pushing the foot backward against an object and holding for 5-10 sec. Repeat as with quad sets.  Leg Slides: Lying on your back, slowly slide your foot toward your buttocks, bending your knee up off the floor (only go as far as is comfortable). Then slowly slide your foot back down until your leg is flat on the floor again. Angel Wings: Lying on your back spread your legs to the side as far apart as you can without causing discomfort.  A rehabilitation program following serious knee injuries can speed recovery and prevent re-injury in the future due to weakened muscles. Contact your doctor or a physical therapist for more information on knee rehabilitation.   POST-OPERATIVE OPIOID TAPER INSTRUCTIONS: It is important to wean off of your opioid medication as soon as possible. If you do not need pain medication after your surgery it is ok to stop day one. Opioids include: Codeine, Hydrocodone(Norco, Vicodin), Oxycodone(Percocet, oxycontin) and hydromorphone amongst others.  Long term and even short term use of opiods can  cause: Increased pain response Dependence Constipation Depression Respiratory depression And more.  Withdrawal symptoms can include Flu like symptoms Nausea, vomiting And more Techniques to manage these symptoms Hydrate well Eat regular healthy meals Stay active Use relaxation techniques(deep breathing, meditating, yoga) Do Not substitute Alcohol to help with tapering If you have been on opioids for less than two weeks and do not have pain than it is ok to stop all together.    plan should start within one week post op of your joint replacement. Maintain the same interval or time between taking each dose and first decrease the dose.  Cut the total daily intake of opioids by one tablet each day Next start to increase the time between doses. The last dose that should be eliminated is the evening dose.   IF YOU ARE TRANSFERRED TO A SKILLED REHAB FACILITY If the patient is transferred to a skilled rehab facility following release from the hospital, a list of the current medications will be sent to the facility for the patient to continue.  When discharged from the skilled rehab facility, please have the facility set up the patient's Home Health Physical Therapy prior to being released. Also, the skilled facility will be responsible for providing the patient with their medications at time of release from the facility to include their pain medication, the muscle relaxants, and their blood thinner medication. If the patient is still at the rehab facility at time of the two week follow up appointment, the skilled rehab facility will also need to assist the patient in arranging follow up appointment in our office and any transportation needs.  MAKE SURE YOU:  Understand these instructions.  Get help right away if you are not doing well or get worse.   DENTAL ANTIBIOTICS:  In most cases prophylactic antibiotics for Dental procdeures after total joint surgery are not  necessary.  Exceptions are as follows:  1. History of prior total joint infection  2. Severely immunocompromised (Organ Transplant, cancer chemotherapy, Rheumatoid biologic meds such as Humera)  3. Poorly controlled diabetes (A1C &gt; 8.0, blood glucose over 200)  If you have one of these conditions, contact your surgeon for an antibiotic prescription, prior to your dental procedure.    Pick up stool softner and laxative for home use following surgery while on pain medications. Do not submerge incision under water. Please use good hand washing techniques while changing dressing each day. May shower starting three days after surgery. Please use a clean towel to pat the incision dry following showers. Continue to use ice for pain and swelling after surgery. Do not use any lotions or creams on the incision until instructed by your surgeon.  

## 2021-09-13 NOTE — Anesthesia Procedure Notes (Signed)
Spinal  Patient location during procedure: OR Start time: 09/13/2021 7:12 AM End time: 09/13/2021 7:17 AM Reason for block: surgical anesthesia Staffing Performed: anesthesiologist  Anesthesiologist: Pervis Hocking, DO Preanesthetic Checklist Completed: patient identified, IV checked, risks and benefits discussed, surgical consent, monitors and equipment checked, pre-op evaluation and timeout performed Spinal Block Patient position: sitting Prep: DuraPrep and site prepped and draped Patient monitoring: cardiac monitor, continuous pulse ox and blood pressure Approach: midline Location: L3-4 Injection technique: single-shot Needle Needle type: Pencan  Needle gauge: 24 G Needle length: 9 cm Assessment Sensory level: T6 Events: CSF return Additional Notes Functioning IV was confirmed and monitors were applied. Sterile prep and drape, including hand hygiene and sterile gloves were used. The patient was positioned and the spine was prepped. The skin was anesthetized with lidocaine.  Free flow of clear CSF was obtained prior to injecting local anesthetic into the CSF.  The spinal needle aspirated freely following injection.  The needle was carefully withdrawn.  The patient tolerated the procedure well.

## 2021-09-13 NOTE — Transfer of Care (Signed)
Immediate Anesthesia Transfer of Care Note  Patient: Kristin Bailey  Procedure(s) Performed: Procedure(s): TOTAL KNEE ARTHROPLASTY (Right)  Patient Location: PACU  Anesthesia Type:Spinal  Level of Consciousness:  sedated, patient cooperative and responds to stimulation  Airway & Oxygen Therapy:Patient Spontanous Breathing and Patient connected to face mask oxgen  Post-op Assessment:  Report given to PACU RN and Post -op Vital signs reviewed and stable  Post vital signs:  Reviewed and stable  Last Vitals:  Vitals:   09/13/21 0609  BP: 133/80  Pulse: 75  Resp: 18  Temp: 36.8 C  SpO2: 037%    Complications: No apparent anesthesia complications

## 2021-09-13 NOTE — Op Note (Signed)
OPERATIVE REPORT-TOTAL KNEE ARTHROPLASTY   Pre-operative diagnosis- Osteoarthritis  Right knee(s)  Post-operative diagnosis- Osteoarthritis Right knee(s)  Procedure-  Right  Total Knee Arthroplasty  Surgeon- Kristin Bailey. Kristin Skilling, MD  Assistant- Kristin Barrows, PA-C   Anesthesia-   Adductor canal block and spinal  EBL-50 mL   Drains None  Tourniquet time-  Total Tourniquet Time Documented: Thigh (Right) - 34 minutes Total: Thigh (Right) - 34 minutes     Complications- None  Condition-PACU - hemodynamically stable.   Brief Clinical Note  Kristin Bailey is a 60 y.o. year old female with end stage OA of her right knee with progressively worsening pain and dysfunction. She has constant pain, with activity and at rest and significant functional deficits with difficulties even with ADLs. She has had extensive non-op management including analgesics, injections of cortisone and viscosupplements, and home exercise program, but remains in significant pain with significant dysfunction.Radiographs show bone on bone arthritis medial and patellofemoral. She presents now for right Total Knee Arthroplasty.     Procedure in detail---   The patient is brought into the operating room and positioned supine on the operating table. After successful administration of  Adductor canal block and spinal,   a tourniquet is placed high on the  Right thigh(s) and the lower extremity is prepped and draped in the usual sterile fashion. Time out is performed by the operating team and then the  Right lower extremity is wrapped in Esmarch, knee flexed and the tourniquet inflated to 300 mmHg.       A midline incision is made with a ten blade through the subcutaneous tissue to the level of the extensor mechanism. A fresh blade is used to make a medial parapatellar arthrotomy. Soft tissue over the proximal medial tibia is subperiosteally elevated to the joint line with a knife and into the semimembranosus bursa with a  Cobb elevator. Soft tissue over the proximal lateral tibia is elevated with attention being paid to avoiding the patellar tendon on the tibial tubercle. The patella is everted, knee flexed 90 degrees and the ACL and PCL are removed. Findings are bone on bone medial and patellofemoral with large global osteophytes        The drill is used to create a starting hole in the distal femur and the canal is thoroughly irrigated with sterile saline to remove the fatty contents. The 5 degree Right  valgus alignment guide is placed into the femoral canal and the distal femoral cutting block is pinned to remove 9 mm off the distal femur. Resection is made with an oscillating saw.      The tibia is subluxed forward and the menisci are removed. The extramedullary alignment guide is placed referencing proximally at the medial aspect of the tibial tubercle and distally along the second metatarsal axis and tibial crest. The block is pinned to remove 66mm off the more deficient medial  side. Resection is made with an oscillating saw. Size 5is the most appropriate size for the tibia and the proximal tibia is prepared with the modular drill and keel punch for that size.      The femoral sizing guide is placed and size 5 is most appropriate. Rotation is marked off the epicondylar axis and confirmed by creating a rectangular flexion gap at 90 degrees. The size 5 cutting block is pinned in this rotation and the anterior, posterior and chamfer cuts are made with the oscillating saw. The intercondylar block is then placed and that cut is made.  Trial size 5 tibial component, trial size 5 posterior stabilized femur and a 8  mm posterior stabilized rotating platform insert trial is placed. Full extension is achieved with excellent varus/valgus and anterior/posterior balance throughout full range of motion. The patella is everted and thickness measured to be 22  mm. Free hand resection is taken to 12 mm, a 38 template is placed, lug  holes are drilled, trial patella is placed, and it tracks normally. Osteophytes are removed off the posterior femur with the trial in place. All trials are removed and the cut bone surfaces prepared with pulsatile lavage. Cement is mixed and once ready for implantation, the size 5 tibial implant, size  5 posterior stabilized femoral component, and the size 38 patella are cemented in place and the patella is held with the clamp. The trial insert is placed and the knee held in full extension. The Exparel (20 ml mixed with 60 ml saline) is injected into the extensor mechanism, posterior capsule, medial and lateral gutters and subcutaneous tissues.  All extruded cement is removed and once the cement is hard the permanent 8 mm posterior stabilized rotating platform insert is placed into the tibial tray.      The wound is copiously irrigated with saline solution and the extensor mechanism closed with # 0 Stratofix suture. The tourniquet is released for a total tourniquet time of 34  minutes. Flexion against gravity is 140 degrees and the patella tracks normally. Subcutaneous tissue is closed with 2.0 vicryl and subcuticular with running 4.0 Monocryl. The incision is cleaned and dried and steri-strips and a bulky sterile dressing are applied. The limb is placed into a knee immobilizer and the patient is awakened and transported to recovery in stable condition.      Please note that a surgical assistant was a medical necessity for this procedure in order to perform it in a safe and expeditious manner. Surgical assistant was necessary to retract the ligaments and vital neurovascular structures to prevent injury to them and also necessary for proper positioning of the limb to allow for anatomic placement of the prosthesis.   Kristin Bailey Kristin Holtzclaw, MD    09/13/2021, 8:21 AM

## 2021-09-13 NOTE — Anesthesia Procedure Notes (Signed)
Anesthesia Regional Block: Adductor canal block   Pre-Anesthetic Checklist: , timeout performed,  Correct Patient, Correct Site, Correct Laterality,  Correct Procedure, Correct Position, site marked,  Risks and benefits discussed,  Surgical consent,  Pre-op evaluation,  At surgeon's request and post-op pain management  Laterality: Right  Prep: Maximum Sterile Barrier Precautions used, chloraprep       Needles:  Injection technique: Single-shot  Needle Type: Echogenic Stimulator Needle     Needle Length: 9cm  Needle Gauge: 22     Additional Needles:   Procedures:,,,, ultrasound used (permanent image in chart),,    Narrative:  Start time: 09/13/2021 7:00 AM End time: 09/13/2021 7:05 AM Injection made incrementally with aspirations every 5 mL.  Performed by: Personally  Anesthesiologist: Pervis Hocking, DO  Additional Notes: Monitors applied. No increased pain on injection. No increased resistance to injection. Injection made in 5cc increments. Good needle visualization. Patient tolerated procedure well.

## 2021-09-13 NOTE — Interval H&P Note (Signed)
History and Physical Interval Note:  09/13/2021 6:38 AM  Kristin Bailey  has presented today for surgery, with the diagnosis of right knee osteoarthritis.  The various methods of treatment have been discussed with the patient and family. After consideration of risks, benefits and other options for treatment, the patient has consented to  Procedure(s): TOTAL KNEE ARTHROPLASTY (Right) as a surgical intervention.  The patient's history has been reviewed, patient examined, no change in status, stable for surgery.  I have reviewed the patient's chart and labs.  Questions were answered to the patient's satisfaction.     [name]

## 2021-09-13 NOTE — Anesthesia Postprocedure Evaluation (Signed)
Anesthesia Post Note  Patient: Kristin Bailey  Procedure(s) Performed: TOTAL KNEE ARTHROPLASTY (Right: Knee)     Patient location during evaluation: PACU Anesthesia Type: Regional, MAC and Spinal Level of consciousness: awake and alert and oriented Pain management: pain level controlled Vital Signs Assessment: post-procedure vital signs reviewed and stable Respiratory status: spontaneous breathing, nonlabored ventilation and respiratory function stable Cardiovascular status: blood pressure returned to baseline and stable Postop Assessment: no headache, no backache, spinal receding and no apparent nausea or vomiting Anesthetic complications: no   No notable events documented.  Last Vitals:  Vitals:   09/13/21 1000 09/13/21 1017  BP: 131/73 127/75  Pulse: 73 74  Resp: (!) 22 16  Temp:  36.7 C  SpO2: 98% 97%    Last Pain:  Vitals:   09/13/21 1017  TempSrc: Oral  PainSc: [place]

## 2021-09-13 NOTE — Progress Notes (Signed)
Orthopedic Tech Progress Note Patient Details:  Kristin Bailey [birth year] [id number]  CPM Right Knee CPM Right Knee: On Right Knee Flexion (Degrees): 40 Right Knee Extension (Degrees): 10  Post Interventions Patient Tolerated: Well Instructions Provided: Care of device  Kristin Bailey 09/13/2021, 8:51 AM

## 2021-09-14 ENCOUNTER — Encounter (HOSPITAL_COMMUNITY): Payer: Self-pay | Admitting: Orthopedic Surgery

## 2021-09-14 DIAGNOSIS — M1711 Unilateral primary osteoarthritis, right knee: Secondary | ICD-10-CM | POA: Diagnosis not present

## 2021-09-14 LAB — CBC
HCT: 34.3 % — ABNORMAL LOW (ref 36.0–46.0)
Hemoglobin: 11 g/dL — ABNORMAL LOW (ref 12.0–15.0)
MCH: 27.9 pg (ref 26.0–34.0)
MCHC: 32.1 g/dL (ref 30.0–36.0)
MCV: 87.1 fL (ref 80.0–100.0)
Platelets: 241 10*3/uL (ref 150–400)
RBC: 3.94 MIL/uL (ref 3.87–5.11)
RDW: 13.4 % (ref 11.5–15.5)
WBC: 19.9 10*3/uL — ABNORMAL HIGH (ref 4.0–10.5)
nRBC: 0 % (ref 0.0–0.2)

## 2021-09-14 LAB — BASIC METABOLIC PANEL
Anion gap: 6 (ref 5–15)
BUN: 16 mg/dL (ref 6–20)
CO2: 28 mmol/L (ref 22–32)
Calcium: 9 mg/dL (ref 8.9–10.3)
Chloride: 102 mmol/L (ref 98–111)
Creatinine, Ser: 0.48 mg/dL (ref 0.44–1.00)
GFR, Estimated: >60 mL/min (ref >60)
Glucose, Bld: 150 mg/dL — ABNORMAL HIGH (ref 70–99)
Potassium: 4.7 mmol/L (ref 3.5–5.1)
Sodium: 136 mmol/L (ref 135–145)

## 2021-09-14 LAB — GLUCOSE, CAPILLARY
Glucose-Capillary: 115 mg/dL — ABNORMAL HIGH (ref 70–99)
Glucose-Capillary: 116 mg/dL — ABNORMAL HIGH (ref 70–99)

## 2021-09-14 MED ORDER — ASPIRIN 325 MG PO TBEC: 325.0000 mg | DELAYED_RELEASE_TABLET | Freq: Two times a day (BID) | ORAL | 0 refills | Status: AC

## 2021-09-14 MED ORDER — METHOCARBAMOL 500 MG PO TABS: 500.0000 mg | ORAL_TABLET | Freq: Four times a day (QID) | ORAL | 0 refills | Status: DC | PRN

## 2021-09-14 MED ORDER — OXYCODONE HCL 5 MG PO TABS: 5.0000 mg | ORAL_TABLET | Freq: Four times a day (QID) | ORAL | 0 refills | Status: DC | PRN

## 2021-09-14 NOTE — Progress Notes (Signed)
Physical Therapy Treatment Patient Details Name: Kristin Bailey MRN: [id number] DOB: October 13, 1961 Today's Date: 09/14/2021   History of Present Illness Patient is 60 y.o. female s/p Rt TKA On 09/13/21 with PMH significant for OA, DM, depression, anxiety, fibromyalgia, GERD, HTN, hypothyroidism, Lt TKA on 06/21/21.    PT Comments    POD # 1 pm session Am session withheld due to 10/10 pain.  "I was in bad shape" stated pt. PM session pt received IV Dilaudid earlier.  Pt very knowledgeable as she just had her "other" knee done in August. Assisted OOB to amb.  General bed mobility comments: demonstrated and instructed how to use a belt to self assist LE.  General transfer comment: good safety cognition and use of hands to steady self.  General Gait Details: tolerated a functional distance with proper walker use and safe cognition. No stairs to enetr her home.  Then returned to room to perform some TE's following HEP handout.  Instructed on proper tech, freq as well as use of ICE.   Addressed all mobility questions, discussed appropriate activity, educated on use of ICE.  Pt ready for D/C to home.   Recommendations for follow up therapy are one component of a multi-disciplinary discharge planning process, led by the attending physician.  Recommendations may be updated based on patient status, additional functional criteria and insurance authorization.  Follow Up Recommendations  Outpatient PT     Assistance Recommended at Discharge None  Equipment Recommendations  None recommended by PT    Recommendations for Other Services       Precautions / Restrictions Precautions Precautions: Fall Precaution Comments: instructed no pillow under knee Restrictions Weight Bearing Restrictions: No Other Position/Activity Restrictions: WBAT     Mobility  Bed Mobility Overal bed mobility: Needs Assistance Bed Mobility: Supine to Sit;Sit to Supine     Supine to sit: Modified independent  (Device/Increase time) Sit to supine: Modified independent (Device/Increase time)   General bed mobility comments: demonstrated and instructed how to use a belt to self assist LE    Transfers Overall transfer level: Needs assistance Equipment used: Rolling walker (2 wheels) Transfers: Sit to/from Stand;Stand Pivot Transfers Sit to Stand: Supervision Stand pivot transfers: Supervision         General transfer comment: good safety cognition and use of hands to steady self    Ambulation/Gait Ambulation/Gait assistance: Supervision;Min guard Gait Distance (Feet): 85 Feet Assistive device: Rolling walker (2 wheels) Gait Pattern/deviations: Step-to pattern;Decreased stance time - right Gait velocity: decreased   General Gait Details: tolerated a functional distance with proper walker use and safe cognition.   Stairs Stairs:  (no stairs to enter home)           Wheelchair Mobility    Modified Rankin (Stroke Patients Only)       Balance                                            Cognition     Overall Cognitive Status: Within Functional Limits for tasks assessed                                 General Comments: AxO x 3 just had "other" knee done in August.  Knowledgable. Motivated.        Exercises  Total Knee Replacement TE's  following HEP handout 10 reps B LE ankle pumps 05 reps towel squeezes 05 reps knee presses 05 reps heel slides  05 reps SAQ's 05 reps SLR's 05 reps ABD Educated on use of gait belt to assist with TE's Followed by ICE     General Comments        Pertinent Vitals/Pain Pain Assessment: 0-10 Pain Score: 7  Pain Location: Rt knee Pain Descriptors / Indicators: Aching;Discomfort;Operative site guarding Pain Intervention(s): Monitored during session;Premedicated before session;Repositioned    Home Living                          Prior Function            PT Goals (current goals  can now be found in the care plan section) Progress towards PT goals: Progressing toward goals    Frequency    7X/week      PT Plan Current plan remains appropriate    Co-evaluation              AM-PAC PT "6 Clicks" Mobility   Outcome Measure  Help needed turning from your back to your side while in a flat bed without using bedrails?: A Little Help needed moving from lying on your back to sitting on the side of a flat bed without using bedrails?: A Little Help needed moving to and from a bed to a chair (including a wheelchair)?: A Little Help needed standing up from a chair using your arms (e.g., wheelchair or bedside chair)?: A Little Help needed to walk in hospital room?: A Little Help needed climbing 3-5 steps with a railing? : A Little 6 Click Score: 18    End of Session Equipment Utilized During Treatment: Gait belt Activity Tolerance: Patient tolerated treatment well Patient left: in bed;with call bell/phone within reach Nurse Communication: Mobility status PT Visit Diagnosis: Muscle weakness (generalized) (M62.81);Difficulty in walking, not elsewhere classified (R26.2);Pain Pain - Right/Left: Right Pain - part of body: Knee     Time: 1310-1335 PT Time Calculation (min) (ACUTE ONLY): 25 min  Charges:  $Gait Training: 8-22 mins $Therapeutic Exercise: 8-22 mins                    Rica Koyanagi  PTA Acute  Rehabilitation Services Pager      [phone] Office      [phone]

## 2021-09-14 NOTE — Progress Notes (Signed)
   Subjective: 1 Day Post-Op Procedure(s) (LRB): TOTAL KNEE ARTHROPLASTY (Right) Patient reports pain as mild.   Patient seen in rounds by Dr. Wynelle Link. Patient is well, and has had no acute complaints or problems other than pain in the right knee. Denies chest pain or SOB. Foley catheter removed this AM. We will continue therapy today.   Objective: Vital signs in last 24 hours: Temp:  [97.7 F (36.5 C)-98.4 F (36.9 C)] 97.7 F (36.5 C) (11/01 0630) Pulse Rate:  [71-88] 72 (11/01 0630) Resp:  [16-22] 16 (11/01 0630) BP: (113-146)/(65-78) 130/65 (11/01 0630) SpO2:  [94 %-100 %] 95 % (11/01 0630)  Intake/Output from previous day:  Intake/Output Summary (Last 24 hours) at 09/14/2021 0805 Last data filed at 09/14/2021 0600 Gross per 24 hour  Intake 4707.5 ml  Output 4350 ml  Net 357.5 ml     Intake/Output this shift: No intake/output data recorded.  Labs: Recent Labs    09/14/21 0332  HGB 11.0*   Recent Labs    09/14/21 0332  WBC 19.9*  RBC 3.94  HCT 34.3*  PLT 241   Recent Labs    09/14/21 0332  NA 136  K 4.7  CL 102  CO2 28  BUN 16  CREATININE 0.48  GLUCOSE 150*  CALCIUM 9.0   No results for input(s): LABPT, INR in the last 72 hours.  Exam: General - Patient is Alert and Oriented Extremity - Neurologically intact Neurovascular intact Sensation intact distally Dorsiflexion/Plantar flexion intact Dressing - dressing C/D/I Motor Function - intact, moving foot and toes well on exam.   Past Medical History:  Diagnosis Date   Anxiety    Arthritis    Asthma    Complication of anesthesia    Depression    Diabetes mellitus without complication (HCC)    Diverticulitis    Fibromyalgia    GERD (gastroesophageal reflux disease)    History of kidney stones    Hypertension    Hypothyroidism    IBS (irritable bowel syndrome)    Pneumonia    PONV (postoperative nausea and vomiting)    Sleep apnea    Thyroid disease     Assessment/Plan: 1 Day  Post-Op Procedure(s) (LRB): TOTAL KNEE ARTHROPLASTY (Right) Principal Problem:   OA (osteoarthritis) of knee Active Problems:   Primary osteoarthritis of right knee  Estimated body mass index is 38.9 kg/m as calculated from the following:   Height as of this encounter: 5\' 6"  (1.676 m).   Weight as of this encounter: 109.3 kg. Advance diet Up with therapy D/C IV fluids   Patient's anticipated LOS is less than 2 midnights, meeting these requirements: - Younger than 62 - Lives within 1 hour of care - Has a competent adult at home to recover with post-op recover - NO history of  - Chronic pain requiring opioids  - Coronary Artery Disease  - Heart failure  - Heart attack  - Stroke  - DVT/VTE  - Cardiac arrhythmia  - Respiratory Failure/COPD  - Renal failure  - Anemia  - Advanced Liver disease  DVT Prophylaxis - Aspirin Weight bearing as tolerated. Continue therapy.  Plan is to go Home after hospital stay. Plan for discharge later today once meeting goals with PT Scheduled for OPPT with [facility]) Follow-up in the office in 2 weeks  Postoperative prescriptions were sent in for patient on Friday prior to surgery through clinic EMR.  Theresa Duty, PA-C Orthopedic Surgery [phone] 09/14/2021, 8:05 AM

## 2021-09-14 NOTE — TOC Transition Note (Signed)
Transition of Care [facility]) - CM/SW Discharge Note  Patient Details  Name: Kristin Bailey MRN: [id number] Date of Birth: [birth year]  Transition of Care [facility]) CM/SW Contact:  Sherie Don, LCSW Phone Number: 09/14/2021, 9:57 AM  Clinical Narrative: Patient is expected to discharge after working with PT. CSW met with patient to confirm discharge plan. Patient will discharge home with OPPT at [facility] in [place]. Patient has a rolling walker, 3N1, cane, shower chair, and transfer bench at home so there are no DME needs at this time. TOC signing off.  Final next level of care: OP Rehab Barriers to Discharge: No Barriers Identified  Patient Goals and CMS Choice Patient states their goals for this hospitalization and ongoing recovery are:: Discharge home with OPPT Choice offered to / list presented to : NA  Discharge Plan and Services        DME Arranged: N/A DME Agency: NA  Readmission Risk Interventions No flowsheet data found.

## 2021-09-20 NOTE — Discharge Summary (Signed)
Physician Discharge Summary   Patient ID: Kristin Bailey MRN: [id number] DOB/AGE: 59-Jun-1962 59 y.o.  Admit date: 09/13/2021 Discharge date: 09/14/2021  Primary Diagnosis: Osteoarthritis, right knee   Admission Diagnoses:  Past Medical History:  Diagnosis Date   Anxiety    Arthritis    Asthma    Complication of anesthesia    Depression    Diabetes mellitus without complication (HCC)    Diverticulitis    Fibromyalgia    GERD (gastroesophageal reflux disease)    History of kidney stones    Hypertension    Hypothyroidism    IBS (irritable bowel syndrome)    Pneumonia    PONV (postoperative nausea and vomiting)    Sleep apnea    Thyroid disease    Discharge Diagnoses:   Principal Problem:   OA (osteoarthritis) of knee Active Problems:   Primary osteoarthritis of right knee  Estimated body mass index is 38.9 kg/m as calculated from the following:   Height as of this encounter: 5\' 6"  (1.676 m).   Weight as of this encounter: 109.3 kg.  Procedure:  Procedure(s) (LRB): TOTAL KNEE ARTHROPLASTY (Right)   Consults: None  HPI: Kristin Bailey is a 60 y.o. year old female with end stage OA of her right knee with progressively worsening pain and dysfunction. She has constant pain, with activity and at rest and significant functional deficits with difficulties even with ADLs. She has had extensive non-op management including analgesics, injections of cortisone and viscosupplements, and home exercise program, but remains in significant pain with significant dysfunction.Radiographs show bone on bone arthritis medial and patellofemoral. She presents now for right Total Knee Arthroplasty.   Laboratory Data: Admission on 09/13/2021, Discharged on 09/14/2021  Component Date Value Ref Range Status   Glucose-Capillary 09/13/2021 109 (A)  70 - 99 mg/dL Final   Glucose reference range applies only to samples taken after fasting for at least 8 hours.   Comment 1 09/13/2021 Notify RN    Final   Comment 2 09/13/2021 Document in Chart   Final   Glucose-Capillary 09/13/2021 98  70 - 99 mg/dL Final   Glucose reference range applies only to samples taken after fasting for at least 8 hours.   Glucose-Capillary 09/13/2021 175 (A)  70 - 99 mg/dL Final   Glucose reference range applies only to samples taken after fasting for at least 8 hours.   WBC 09/14/2021 19.9 (A)  4.0 - 10.5 K/uL Final   RBC 09/14/2021 3.94  3.87 - 5.11 MIL/uL Final   Hemoglobin 09/14/2021 11.0 (A)  12.0 - 15.0 g/dL Final   HCT 09/14/2021 34.3 (A)  36.0 - 46.0 % Final   MCV 09/14/2021 87.1  80.0 - 100.0 fL Final   MCH 09/14/2021 27.9  26.0 - 34.0 pg Final   MCHC 09/14/2021 32.1  30.0 - 36.0 g/dL Final   RDW 09/14/2021 13.4  11.5 - 15.5 % Final   Platelets 09/14/2021 241  150 - 400 K/uL Final   nRBC 09/14/2021 0.0  0.0 - 0.2 % Final   Performed at [facility], [place] [address] [place], [place] [address]   Sodium 09/14/2021 136  135 - 145 mmol/L Final   Potassium 09/14/2021 4.7  3.5 - 5.1 mmol/L Final   Chloride 09/14/2021 102  98 - 111 mmol/L Final   CO2 09/14/2021 28  22 - 32 mmol/L Final   Glucose, Bld 09/14/2021 150 (A)  70 - 99 mg/dL Final   Glucose reference range applies only  to samples taken after fasting for at least 8 hours.   BUN 09/14/2021 16  6 - 20 mg/dL Final   Creatinine, Ser 09/14/2021 0.48  0.44 - 1.00 mg/dL Final   Calcium 09/14/2021 9.0  8.9 - 10.3 mg/dL Final   GFR, Estimated 09/14/2021 >60  >60 mL/min Final   Comment: (NOTE) Calculated using the CKD-EPI Creatinine Equation (2021)    Anion gap 09/14/2021 6  5 - 15 Final   Performed at [facility], [place] [address], [place], [place] [address]   Glucose-Capillary 09/13/2021 190 (A)  70 - 99 mg/dL Final   Glucose reference range applies only to samples taken after fasting for at least 8 hours.   Glucose-Capillary 09/14/2021 115 (A)  70 - 99 mg/dL Final   Glucose reference range applies only to  samples taken after fasting for at least 8 hours.   Glucose-Capillary 09/14/2021 116 (A)  70 - 99 mg/dL Final   Glucose reference range applies only to samples taken after fasting for at least 8 hours.  Hospital Outpatient Visit on 08/31/2021  Component Date Value Ref Range Status   Hgb A1c MFr Bld 08/31/2021 5.6  4.8 - 5.6 % Final   Comment: (NOTE) Pre diabetes:          5.7%-6.4%  Diabetes:              >6.4%  Glycemic control for   <7.0% adults with diabetes    Mean Plasma Glucose 08/31/2021 114.02  mg/dL Final   Performed at [facility], [place] [address], [place], [place] [address]   WBC 08/31/2021 14.4 (A)  4.0 - 10.5 K/uL Final   RBC 08/31/2021 5.02  3.87 - 5.11 MIL/uL Final   Hemoglobin 08/31/2021 13.9  12.0 - 15.0 g/dL Final   HCT 08/31/2021 43.7  36.0 - 46.0 % Final   MCV 08/31/2021 87.1  80.0 - 100.0 fL Final   MCH 08/31/2021 27.7  26.0 - 34.0 pg Final   MCHC 08/31/2021 31.8  30.0 - 36.0 g/dL Final   RDW 08/31/2021 13.8  11.5 - 15.5 % Final   Platelets 08/31/2021 347  150 - 400 K/uL Final   nRBC 08/31/2021 0.0  0.0 - 0.2 % Final   Performed at [facility], [place] [address], [place], [place] [address]   Sodium 08/31/2021 138  135 - 145 mmol/L Final   Potassium 08/31/2021 4.2  3.5 - 5.1 mmol/L Final   Chloride 08/31/2021 103  98 - 111 mmol/L Final   CO2 08/31/2021 27  22 - 32 mmol/L Final   Glucose, Bld 08/31/2021 113 (A)  70 - 99 mg/dL Final   Glucose reference range applies only to samples taken after fasting for at least 8 hours.   BUN 08/31/2021 17  6 - 20 mg/dL Final   Creatinine, Ser 08/31/2021 0.48  0.44 - 1.00 mg/dL Final   Calcium 08/31/2021 9.6  8.9 - 10.3 mg/dL Final   Total Protein 08/31/2021 7.8  6.5 - 8.1 g/dL Final   Albumin 08/31/2021 4.2  3.5 - 5.0 g/dL Final   AST 08/31/2021 17  15 - 41 U/L Final   ALT 08/31/2021 26  0 - 44 U/L Final   Alkaline Phosphatase 08/31/2021 70  38 - 126 U/L Final   Total Bilirubin 08/31/2021 0.5  0.3  - 1.2 mg/dL Final   GFR, Estimated 08/31/2021 >60  >60 mL/min Final   Comment: (NOTE) Calculated using the CKD-EPI Creatinine Equation (2021)  Anion gap 08/31/2021 8  5 - 15 Final   Performed at [facility], [place] [address] [place], [place] [address]   Prothrombin Time 08/31/2021 12.6  11.4 - 15.2 seconds Final   INR 08/31/2021 0.9  0.8 - 1.2 Final   Comment: (NOTE) INR goal varies based on device and disease states. Performed at [facility], [place] [address] [place], [place] [address]    Glucose-Capillary 08/31/2021 116 (A)  70 - 99 mg/dL Final   Glucose reference range applies only to samples taken after fasting for at least 8 hours.   MRSA, PCR 08/31/2021 NEGATIVE  NEGATIVE Final   Staphylococcus aureus 08/31/2021 NEGATIVE  NEGATIVE Final   Comment: (NOTE) The Xpert SA Assay (FDA approved for NASAL specimens in patients 59 years of age and older), is one component of a comprehensive surveillance program. It is not intended to diagnose infection nor to guide or monitor treatment. Performed at [facility], [place] [address] [place], [place] [address]      X-Rays:No results found.  EKG: Orders placed or performed during the hospital encounter of 06/17/21   EKG 12 lead per protocol   EKG 12 lead per protocol     Hospital Course: Kristin Bailey is a 60 y.o. who was admitted to [facility]. They were brought to the operating room on 09/13/2021 and underwent Procedure(s): TOTAL KNEE ARTHROPLASTY.  Patient tolerated the procedure well and was later transferred to the recovery room and then to the orthopaedic floor for postoperative care. They were given PO and IV analgesics for pain control following their surgery. They were given 24 hours of postoperative antibiotics of  Anti-infectives (From admission, onward)    Start     Dose/Rate Route Frequency Ordered Stop   09/13/21 1400  ceFAZolin (ANCEF) IVPB 2g/100 mL  premix        2 g 200 mL/hr over 30 Minutes Intravenous Every 6 hours 09/13/21 1014 09/13/21 2123   09/13/21 0600  ceFAZolin (ANCEF) IVPB 2g/100 mL premix        2 g 200 mL/hr over 30 Minutes Intravenous On call to O.R. 09/13/21 6333 09/13/21 0716      and started on DVT prophylaxis in the form of Aspirin.   PT and OT were ordered for total joint protocol. Discharge planning consulted to help with postop disposition and equipment needs.  Patient had a good night on the evening of surgery. They started to get up OOB with therapy on POD #0. Pt was seen during rounds and was ready to go home pending progress with therapy. She worked with therapy on POD #1 and was meeting her goals. Pt was discharged to home later that day in stable condition.  Diet: Diabetic diet Activity: WBAT Follow-up: in 2 weeks Disposition: Home with OPPT Discharged Condition: stable   Discharge Instructions     Call MD / Call 911   Complete by: As directed    If you experience chest pain or shortness of breath, CALL 911 and be transported to the hospital emergency room.  If you develope a fever above 101 F, pus (white drainage) or increased drainage or redness at the wound, or calf pain, call your surgeon's office.   Change dressing   Complete by: As directed    You may remove the bulky bandage (ACE wrap and gauze) two days after surgery. You will have an adhesive waterproof bandage underneath. Leave this in place until your first follow-up  appointment.   Constipation Prevention   Complete by: As directed    Drink plenty of fluids.  Prune juice may be helpful.  You may use a stool softener, such as Colace (over the counter) 100 mg twice a day.  Use MiraLax (over the counter) for constipation as needed.   Diet - low sodium heart healthy   Complete by: As directed    Do not put a pillow under the knee. Place it under the heel.   Complete by: As directed    Driving restrictions   Complete by: As directed    No  driving for two weeks   Post-operative opioid taper instructions:   Complete by: As directed    POST-OPERATIVE OPIOID TAPER INSTRUCTIONS: It is important to wean off of your opioid medication as soon as possible. If you do not need pain medication after your surgery it is ok to stop day one. Opioids include: Codeine, Hydrocodone(Norco, Vicodin), Oxycodone(Percocet, oxycontin) and hydromorphone amongst others.  Long term and even short term use of opiods can cause: Increased pain response Dependence Constipation Depression Respiratory depression And more.  Withdrawal symptoms can include Flu like symptoms Nausea, vomiting And more Techniques to manage these symptoms Hydrate well Eat regular healthy meals Stay active Use relaxation techniques(deep breathing, meditating, yoga) Do Not substitute Alcohol to help with tapering If you have been on opioids for less than two weeks and do not have pain than it is ok to stop all together.  Plan to wean off of opioids This plan should start within one week post op of your joint replacement. Maintain the same interval or time between taking each dose and first decrease the dose.  Cut the total daily intake of opioids by one tablet each day Next start to increase the time between doses. The last dose that should be eliminated is the evening dose.      TED hose   Complete by: As directed    Use stockings (TED hose) for three weeks on both leg(s).  You may remove them at night for sleeping.   Weight bearing as tolerated   Complete by: As directed       Allergies as of 09/14/2021   No Known Allergies      Medication List     STOP taking these medications    diclofenac 75 MG EC tablet Commonly known as: VOLTAREN       TAKE these medications    acetaminophen 650 MG CR tablet Commonly known as: TYLENOL Take 1,300 mg by mouth 2 (two) times daily.   Allergy Eye 0.025-0.3 % ophthalmic solution Generic drug:  naphazoline-pheniramine Place 1 drop into both eyes 4 (four) times daily as needed for eye irritation or allergies.   ALPRAZolam 0.5 MG tablet Commonly known as: XANAX Take 1 mg by mouth at bedtime.   aspirin 325 MG EC tablet Take 1 tablet (325 mg total) by mouth 2 (two) times daily for 20 days. Then resume one 81 mg aspirin once a day. What changed:  medication strength how much to take when to take this additional instructions   cetirizine 10 MG tablet Commonly known as: ZYRTEC Take 10 mg by mouth daily.   docusate sodium 100 MG capsule Commonly known as: COLACE Take 1 capsule (100 mg total) by mouth 2 (two) times daily. What changed:  how much to take when to take this   DULoxetine 60 MG capsule Commonly known as: CYMBALTA Take 1 capsule (60 mg total) by mouth  2 (two) times daily.   Fish Oil 1000 MG Caps Take 1,000 mg by mouth daily.   gabapentin 300 MG capsule Commonly known as: NEURONTIN Take 300 mg by mouth at bedtime.   glipiZIDE 10 MG 24 hr tablet Commonly known as: GLUCOTROL XL Take 10 mg by mouth daily.   Glucosamine Chond MSM Formula Tabs Take 1,500 mg by mouth daily.   levothyroxine 100 MCG tablet Commonly known as: SYNTHROID Take 100 mcg by mouth daily before breakfast.   lisinopril-hydrochlorothiazide 20-12.5 MG tablet Commonly known as: ZESTORETIC Take 0.5 tablets by mouth daily.   loperamide 2 MG capsule Commonly known as: IMODIUM Take 1 capsule (2 mg total) by mouth 4 (four) times daily as needed for diarrhea or loose stools.   methocarbamol 500 MG tablet Commonly known as: ROBAXIN Take 1 tablet (500 mg total) by mouth every 6 (six) hours as needed for muscle spasms.   multivitamin with minerals Tabs tablet Take 1 tablet by mouth daily. Woman's 50+   omeprazole 20 MG tablet Commonly known as: PRILOSEC OTC Take 1 tablet (20 mg total) by mouth daily. What changed:  when to take this additional instructions   ondansetron 4 MG  tablet Commonly known as: ZOFRAN Take 1 tablet (4 mg total) by mouth every 8 (eight) hours as needed for nausea or vomiting.   oxyCODONE 5 MG immediate release tablet Commonly known as: Oxy IR/ROXICODONE Take 1-2 tablets (5-10 mg total) by mouth every 6 (six) hours as needed for severe pain. What changed:  reasons to take this additional instructions   OZEMPIC (0.25 OR 0.5 MG/DOSE) [place] Inject 0.5 mg into the skin once a week.   valACYclovir 1000 MG tablet Commonly known as: VALTREX Take 2,000 mg by mouth See admin instructions. As needed for fever blisters, take 2 tablets twice a day for two doses.   VITAMIN C PO Take 2,000 mg by mouth daily.   Vitamin D 125 MCG (5000 UT) Caps Take 5,000 Units by mouth daily.   zinc gluconate 50 MG tablet Take 50 mg by mouth daily.               Discharge Care Instructions  (From admission, onward)           Start     Ordered   09/14/21 0000  Weight bearing as tolerated        09/14/21 0808   09/14/21 0000  Change dressing       Comments: You may remove the bulky bandage (ACE wrap and gauze) two days after surgery. You will have an adhesive waterproof bandage underneath. Leave this in place until your first follow-up appointment.   09/14/21 5102            Follow-up Information     Gaynelle Arabian, MD. Schedule an appointment as soon as possible for a visit on 09/28/2021.   Specialty: Orthopedic Surgery Contact information: [address] [place] [address] [phone]                 Signed: Theresa Duty, PA-C Orthopedic Surgery 09/20/2021, 7:34 AM

## 2021-10-01 ENCOUNTER — Other Ambulatory Visit (HOSPITAL_COMMUNITY): Payer: Self-pay | Admitting: Internal Medicine

## 2021-10-01 DIAGNOSIS — Z1231 Encounter for screening mammogram for malignant neoplasm of breast: Secondary | ICD-10-CM

## 2021-10-22 ENCOUNTER — Other Ambulatory Visit: Payer: Self-pay

## 2021-10-22 ENCOUNTER — Ambulatory Visit (HOSPITAL_COMMUNITY)
Admission: RE | Admit: 2021-10-22 | Discharge: 2021-10-22 | Disposition: A | Discharge status: Home / Self Care | Payer: 59 | Source: Ambulatory Visit | Attending: Internal Medicine | Admitting: Internal Medicine

## 2021-10-22 DIAGNOSIS — Z1231 Encounter for screening mammogram for malignant neoplasm of breast: Secondary | ICD-10-CM | POA: Insufficient documentation

## 2022-04-25 DIAGNOSIS — R82998 Other abnormal findings in urine: Secondary | ICD-10-CM | POA: Diagnosis not present

## 2022-04-25 DIAGNOSIS — F329 Major depressive disorder, single episode, unspecified: Secondary | ICD-10-CM | POA: Diagnosis not present

## 2022-04-25 DIAGNOSIS — N390 Urinary tract infection, site not specified: Secondary | ICD-10-CM | POA: Diagnosis not present

## 2022-04-25 DIAGNOSIS — E1165 Type 2 diabetes mellitus with hyperglycemia: Secondary | ICD-10-CM | POA: Diagnosis not present

## 2022-04-25 DIAGNOSIS — G4733 Obstructive sleep apnea (adult) (pediatric): Secondary | ICD-10-CM | POA: Diagnosis not present

## 2022-04-25 DIAGNOSIS — R197 Diarrhea, unspecified: Secondary | ICD-10-CM | POA: Diagnosis not present

## 2022-04-28 ENCOUNTER — Other Ambulatory Visit (HOSPITAL_COMMUNITY): Payer: Self-pay | Admitting: Family Medicine

## 2022-04-28 ENCOUNTER — Other Ambulatory Visit: Payer: Self-pay | Admitting: Family Medicine

## 2022-04-28 DIAGNOSIS — R109 Unspecified abdominal pain: Secondary | ICD-10-CM

## 2022-05-11 DIAGNOSIS — G4733 Obstructive sleep apnea (adult) (pediatric): Secondary | ICD-10-CM | POA: Diagnosis not present

## 2022-05-11 DIAGNOSIS — I10 Essential (primary) hypertension: Secondary | ICD-10-CM | POA: Diagnosis not present

## 2022-05-18 ENCOUNTER — Ambulatory Visit (HOSPITAL_BASED_OUTPATIENT_CLINIC_OR_DEPARTMENT_OTHER)
Admission: RE | Admit: 2022-05-18 | Discharge: 2022-05-18 | Disposition: A | Discharge status: Home / Self Care | Payer: No Typology Code available for payment source | Source: Ambulatory Visit | Attending: Family Medicine | Admitting: Family Medicine

## 2022-05-18 DIAGNOSIS — R109 Unspecified abdominal pain: Secondary | ICD-10-CM | POA: Insufficient documentation

## 2022-05-18 DIAGNOSIS — N281 Cyst of kidney, acquired: Secondary | ICD-10-CM | POA: Diagnosis not present

## 2022-05-18 DIAGNOSIS — K760 Fatty (change of) liver, not elsewhere classified: Secondary | ICD-10-CM | POA: Diagnosis not present

## 2022-06-10 DIAGNOSIS — I10 Essential (primary) hypertension: Secondary | ICD-10-CM | POA: Diagnosis not present

## 2022-06-10 DIAGNOSIS — G4733 Obstructive sleep apnea (adult) (pediatric): Secondary | ICD-10-CM | POA: Diagnosis not present

## 2022-06-23 DIAGNOSIS — Z96653 Presence of artificial knee joint, bilateral: Secondary | ICD-10-CM | POA: Diagnosis not present

## 2022-06-23 DIAGNOSIS — Z471 Aftercare following joint replacement surgery: Secondary | ICD-10-CM | POA: Diagnosis not present

## 2022-06-30 DIAGNOSIS — J0100 Acute maxillary sinusitis, unspecified: Secondary | ICD-10-CM | POA: Diagnosis not present

## 2022-07-11 DIAGNOSIS — G4733 Obstructive sleep apnea (adult) (pediatric): Secondary | ICD-10-CM | POA: Diagnosis not present

## 2022-07-11 DIAGNOSIS — I10 Essential (primary) hypertension: Secondary | ICD-10-CM | POA: Diagnosis not present

## 2022-07-20 DIAGNOSIS — I10 Essential (primary) hypertension: Secondary | ICD-10-CM | POA: Diagnosis not present

## 2022-07-20 DIAGNOSIS — E039 Hypothyroidism, unspecified: Secondary | ICD-10-CM | POA: Diagnosis not present

## 2022-07-20 DIAGNOSIS — E1165 Type 2 diabetes mellitus with hyperglycemia: Secondary | ICD-10-CM | POA: Diagnosis not present

## 2022-07-20 DIAGNOSIS — E782 Mixed hyperlipidemia: Secondary | ICD-10-CM | POA: Diagnosis not present

## 2022-07-21 DIAGNOSIS — E039 Hypothyroidism, unspecified: Secondary | ICD-10-CM | POA: Diagnosis not present

## 2022-07-21 DIAGNOSIS — E1165 Type 2 diabetes mellitus with hyperglycemia: Secondary | ICD-10-CM | POA: Diagnosis not present

## 2022-07-21 DIAGNOSIS — K5730 Diverticulosis of large intestine without perforation or abscess without bleeding: Secondary | ICD-10-CM | POA: Diagnosis not present

## 2022-07-21 DIAGNOSIS — J4520 Mild intermittent asthma, uncomplicated: Secondary | ICD-10-CM | POA: Diagnosis not present

## 2022-07-21 DIAGNOSIS — E782 Mixed hyperlipidemia: Secondary | ICD-10-CM | POA: Diagnosis not present

## 2022-07-21 DIAGNOSIS — F329 Major depressive disorder, single episode, unspecified: Secondary | ICD-10-CM | POA: Diagnosis not present

## 2022-07-21 DIAGNOSIS — F419 Anxiety disorder, unspecified: Secondary | ICD-10-CM | POA: Diagnosis not present

## 2022-07-21 DIAGNOSIS — Z0001 Encounter for general adult medical examination with abnormal findings: Secondary | ICD-10-CM | POA: Diagnosis not present

## 2022-07-21 DIAGNOSIS — I10 Essential (primary) hypertension: Secondary | ICD-10-CM | POA: Diagnosis not present

## 2022-07-21 DIAGNOSIS — G4700 Insomnia, unspecified: Secondary | ICD-10-CM | POA: Diagnosis not present

## 2022-07-21 DIAGNOSIS — Z23 Encounter for immunization: Secondary | ICD-10-CM | POA: Diagnosis not present

## 2022-08-11 DIAGNOSIS — I10 Essential (primary) hypertension: Secondary | ICD-10-CM | POA: Diagnosis not present

## 2022-08-11 DIAGNOSIS — G4733 Obstructive sleep apnea (adult) (pediatric): Secondary | ICD-10-CM | POA: Diagnosis not present

## 2022-08-16 DIAGNOSIS — F321 Major depressive disorder, single episode, moderate: Secondary | ICD-10-CM | POA: Diagnosis not present

## 2022-08-16 DIAGNOSIS — F419 Anxiety disorder, unspecified: Secondary | ICD-10-CM | POA: Diagnosis not present

## 2022-08-16 DIAGNOSIS — E119 Type 2 diabetes mellitus without complications: Secondary | ICD-10-CM | POA: Diagnosis not present

## 2022-08-16 DIAGNOSIS — Z79899 Other long term (current) drug therapy: Secondary | ICD-10-CM | POA: Diagnosis not present

## 2022-08-16 DIAGNOSIS — G4733 Obstructive sleep apnea (adult) (pediatric): Secondary | ICD-10-CM | POA: Diagnosis not present

## 2022-08-16 DIAGNOSIS — M545 Low back pain, unspecified: Secondary | ICD-10-CM | POA: Diagnosis not present

## 2022-08-16 DIAGNOSIS — Z008 Encounter for other general examination: Secondary | ICD-10-CM | POA: Diagnosis not present

## 2022-08-16 DIAGNOSIS — I10 Essential (primary) hypertension: Secondary | ICD-10-CM | POA: Diagnosis not present

## 2022-08-16 DIAGNOSIS — E039 Hypothyroidism, unspecified: Secondary | ICD-10-CM | POA: Diagnosis not present

## 2022-08-16 DIAGNOSIS — Z6841 Body Mass Index (BMI) 40.0 and over, adult: Secondary | ICD-10-CM | POA: Diagnosis not present

## 2022-09-07 DIAGNOSIS — F329 Major depressive disorder, single episode, unspecified: Secondary | ICD-10-CM | POA: Diagnosis not present

## 2022-09-07 DIAGNOSIS — J111 Influenza due to unidentified influenza virus with other respiratory manifestations: Secondary | ICD-10-CM | POA: Diagnosis not present

## 2022-09-07 DIAGNOSIS — Z0000 Encounter for general adult medical examination without abnormal findings: Secondary | ICD-10-CM | POA: Diagnosis not present

## 2022-09-10 DIAGNOSIS — G4733 Obstructive sleep apnea (adult) (pediatric): Secondary | ICD-10-CM | POA: Diagnosis not present

## 2022-09-10 DIAGNOSIS — I10 Essential (primary) hypertension: Secondary | ICD-10-CM | POA: Diagnosis not present

## 2022-10-11 DIAGNOSIS — J029 Acute pharyngitis, unspecified: Secondary | ICD-10-CM | POA: Diagnosis not present

## 2022-10-11 DIAGNOSIS — G4733 Obstructive sleep apnea (adult) (pediatric): Secondary | ICD-10-CM | POA: Diagnosis not present

## 2022-10-11 DIAGNOSIS — I10 Essential (primary) hypertension: Secondary | ICD-10-CM | POA: Diagnosis not present

## 2022-10-13 DIAGNOSIS — M7061 Trochanteric bursitis, right hip: Secondary | ICD-10-CM | POA: Diagnosis not present

## 2022-10-13 DIAGNOSIS — M25551 Pain in right hip: Secondary | ICD-10-CM | POA: Diagnosis not present

## 2022-10-13 DIAGNOSIS — M25552 Pain in left hip: Secondary | ICD-10-CM | POA: Diagnosis not present

## 2022-10-27 DIAGNOSIS — M7062 Trochanteric bursitis, left hip: Secondary | ICD-10-CM | POA: Diagnosis not present

## 2022-10-27 DIAGNOSIS — M7061 Trochanteric bursitis, right hip: Secondary | ICD-10-CM | POA: Diagnosis not present

## 2022-10-31 DIAGNOSIS — M5451 Vertebrogenic low back pain: Secondary | ICD-10-CM | POA: Diagnosis not present

## 2022-10-31 DIAGNOSIS — M5136 Other intervertebral disc degeneration, lumbar region: Secondary | ICD-10-CM | POA: Diagnosis not present

## 2022-11-10 DIAGNOSIS — I10 Essential (primary) hypertension: Secondary | ICD-10-CM | POA: Diagnosis not present

## 2022-11-10 DIAGNOSIS — G4733 Obstructive sleep apnea (adult) (pediatric): Secondary | ICD-10-CM | POA: Diagnosis not present

## 2022-11-16 DIAGNOSIS — M545 Low back pain, unspecified: Secondary | ICD-10-CM | POA: Diagnosis not present

## 2022-11-24 DIAGNOSIS — M545 Low back pain, unspecified: Secondary | ICD-10-CM | POA: Diagnosis not present

## 2022-12-11 DIAGNOSIS — I10 Essential (primary) hypertension: Secondary | ICD-10-CM | POA: Diagnosis not present

## 2022-12-11 DIAGNOSIS — G4733 Obstructive sleep apnea (adult) (pediatric): Secondary | ICD-10-CM | POA: Diagnosis not present

## 2023-01-11 DIAGNOSIS — I10 Essential (primary) hypertension: Secondary | ICD-10-CM | POA: Diagnosis not present

## 2023-01-11 DIAGNOSIS — G4733 Obstructive sleep apnea (adult) (pediatric): Secondary | ICD-10-CM | POA: Diagnosis not present

## 2023-01-12 DIAGNOSIS — E039 Hypothyroidism, unspecified: Secondary | ICD-10-CM | POA: Diagnosis not present

## 2023-01-12 DIAGNOSIS — N951 Menopausal and female climacteric states: Secondary | ICD-10-CM | POA: Diagnosis not present

## 2023-01-19 DIAGNOSIS — G4700 Insomnia, unspecified: Secondary | ICD-10-CM | POA: Diagnosis not present

## 2023-01-19 DIAGNOSIS — E782 Mixed hyperlipidemia: Secondary | ICD-10-CM | POA: Diagnosis not present

## 2023-01-19 DIAGNOSIS — M545 Low back pain, unspecified: Secondary | ICD-10-CM | POA: Diagnosis not present

## 2023-01-19 DIAGNOSIS — K5730 Diverticulosis of large intestine without perforation or abscess without bleeding: Secondary | ICD-10-CM | POA: Diagnosis not present

## 2023-01-19 DIAGNOSIS — F419 Anxiety disorder, unspecified: Secondary | ICD-10-CM | POA: Diagnosis not present

## 2023-01-19 DIAGNOSIS — E039 Hypothyroidism, unspecified: Secondary | ICD-10-CM | POA: Diagnosis not present

## 2023-01-19 DIAGNOSIS — M797 Fibromyalgia: Secondary | ICD-10-CM | POA: Diagnosis not present

## 2023-01-19 DIAGNOSIS — I10 Essential (primary) hypertension: Secondary | ICD-10-CM | POA: Diagnosis not present

## 2023-01-19 DIAGNOSIS — J4520 Mild intermittent asthma, uncomplicated: Secondary | ICD-10-CM | POA: Diagnosis not present

## 2023-01-19 DIAGNOSIS — E1165 Type 2 diabetes mellitus with hyperglycemia: Secondary | ICD-10-CM | POA: Diagnosis not present

## 2023-01-19 DIAGNOSIS — F329 Major depressive disorder, single episode, unspecified: Secondary | ICD-10-CM | POA: Diagnosis not present

## 2023-01-31 ENCOUNTER — Other Ambulatory Visit: Payer: Self-pay

## 2023-01-31 ENCOUNTER — Ambulatory Visit (HOSPITAL_COMMUNITY)
Admission: RE | Admit: 2023-01-31 | Discharge: 2023-01-31 | Disposition: A | Discharge status: Home / Self Care | Payer: No Typology Code available for payment source | Source: Ambulatory Visit | Attending: Internal Medicine | Admitting: Internal Medicine

## 2023-01-31 ENCOUNTER — Other Ambulatory Visit (HOSPITAL_COMMUNITY): Payer: Self-pay | Admitting: Internal Medicine

## 2023-01-31 ENCOUNTER — Encounter (HOSPITAL_COMMUNITY): Payer: Self-pay

## 2023-01-31 ENCOUNTER — Emergency Department (HOSPITAL_COMMUNITY)
Admission: EM | Admit: 2023-01-31 | Discharge: 2023-01-31 | Disposition: A | Discharge status: Home / Self Care | Payer: No Typology Code available for payment source | Attending: Emergency Medicine | Admitting: Emergency Medicine

## 2023-01-31 DIAGNOSIS — Z7984 Long term (current) use of oral hypoglycemic drugs: Secondary | ICD-10-CM | POA: Diagnosis not present

## 2023-01-31 DIAGNOSIS — Z1152 Encounter for screening for COVID-19: Secondary | ICD-10-CM | POA: Diagnosis not present

## 2023-01-31 DIAGNOSIS — R053 Chronic cough: Secondary | ICD-10-CM | POA: Insufficient documentation

## 2023-01-31 DIAGNOSIS — R112 Nausea with vomiting, unspecified: Secondary | ICD-10-CM | POA: Diagnosis present

## 2023-01-31 DIAGNOSIS — R110 Nausea: Secondary | ICD-10-CM | POA: Diagnosis not present

## 2023-01-31 DIAGNOSIS — E119 Type 2 diabetes mellitus without complications: Secondary | ICD-10-CM | POA: Diagnosis not present

## 2023-01-31 DIAGNOSIS — R5381 Other malaise: Secondary | ICD-10-CM | POA: Insufficient documentation

## 2023-01-31 DIAGNOSIS — R6883 Chills (without fever): Secondary | ICD-10-CM | POA: Insufficient documentation

## 2023-01-31 DIAGNOSIS — R059 Cough, unspecified: Secondary | ICD-10-CM

## 2023-01-31 DIAGNOSIS — R0602 Shortness of breath: Secondary | ICD-10-CM | POA: Diagnosis not present

## 2023-01-31 DIAGNOSIS — R531 Weakness: Secondary | ICD-10-CM | POA: Diagnosis not present

## 2023-01-31 DIAGNOSIS — R079 Chest pain, unspecified: Secondary | ICD-10-CM | POA: Diagnosis not present

## 2023-01-31 LAB — CBC WITH DIFFERENTIAL/PLATELET
Abs Immature Granulocytes: 0.22 10*3/uL — ABNORMAL HIGH (ref 0.00–0.07)
Basophils Absolute: 0.1 10*3/uL (ref 0.0–0.1)
Basophils Relative: 1 %
Eosinophils Absolute: 0.2 10*3/uL (ref 0.0–0.5)
Eosinophils Relative: 1 %
HCT: 44.5 % (ref 36.0–46.0)
Hemoglobin: 14.4 g/dL (ref 12.0–15.0)
Immature Granulocytes: 1 %
Lymphocytes Relative: 18 %
Lymphs Abs: 3.4 10*3/uL (ref 0.7–4.0)
MCH: 28.4 pg (ref 26.0–34.0)
MCHC: 32.4 g/dL (ref 30.0–36.0)
MCV: 87.8 fL (ref 80.0–100.0)
Monocytes Absolute: 0.7 10*3/uL (ref 0.1–1.0)
Monocytes Relative: 4 %
Neutro Abs: 13.6 10*3/uL — ABNORMAL HIGH (ref 1.7–7.7)
Neutrophils Relative %: 75 %
Platelets: 350 10*3/uL (ref 150–400)
RBC: 5.07 MIL/uL (ref 3.87–5.11)
RDW: 13.7 % (ref 11.5–15.5)
WBC: 18.2 10*3/uL — ABNORMAL HIGH (ref 4.0–10.5)
nRBC: 0 % (ref 0.0–0.2)

## 2023-01-31 LAB — BASIC METABOLIC PANEL
Anion gap: 14 (ref 5–15)
BUN: 19 mg/dL (ref 8–23)
CO2: 25 mmol/L (ref 22–32)
Calcium: 9.6 mg/dL (ref 8.9–10.3)
Chloride: 95 mmol/L — ABNORMAL LOW (ref 98–111)
Creatinine, Ser: 0.67 mg/dL (ref 0.44–1.00)
GFR, Estimated: >60 mL/min (ref >60)
Glucose, Bld: 304 mg/dL — ABNORMAL HIGH (ref 70–99)
Potassium: 4.3 mmol/L (ref 3.5–5.1)
Sodium: 134 mmol/L — ABNORMAL LOW (ref 135–145)

## 2023-01-31 LAB — URINALYSIS, ROUTINE W REFLEX MICROSCOPIC
Bilirubin Urine: NEGATIVE
Glucose, UA: NEGATIVE mg/dL
Hgb urine dipstick: NEGATIVE
Ketones, ur: NEGATIVE mg/dL
Leukocytes,Ua: NEGATIVE
Nitrite: NEGATIVE
Protein, ur: NEGATIVE mg/dL
Specific Gravity, Urine: 1.02 (ref 1.005–1.030)
pH: 6 (ref 5.0–8.0)

## 2023-01-31 LAB — RESP PANEL BY RT-PCR (RSV, FLU A&B, COVID)  RVPGX2
Influenza A by PCR: NEGATIVE
Influenza B by PCR: NEGATIVE
Resp Syncytial Virus by PCR: NEGATIVE
SARS Coronavirus 2 by RT PCR: NEGATIVE

## 2023-01-31 LAB — TROPONIN I (HIGH SENSITIVITY): Troponin I (High Sensitivity): 4 ng/L (ref <18)

## 2023-01-31 MED ORDER — ONDANSETRON HCL 4 MG/2ML IJ SOLN
4.0000 mg | Freq: Once | INTRAMUSCULAR | Status: AC
  Administered 2023-01-31: 4 mg via INTRAVENOUS
  Filled 2023-01-31: qty 2

## 2023-01-31 MED ORDER — ONDANSETRON HCL 4 MG PO TABS: 4.0000 mg | ORAL_TABLET | Freq: Three times a day (TID) | ORAL | 0 refills | Status: DC | PRN

## 2023-01-31 MED ORDER — SODIUM CHLORIDE 0.9 % IV BOLUS
1000.0000 mL | Freq: Once | INTRAVENOUS | Status: AC
  Administered 2023-01-31: 1000 mL via INTRAVENOUS

## 2023-01-31 NOTE — ED Triage Notes (Signed)
Pt was here for xray, wanted to come to ED for check up.   Pt endorses weakness x 1 month, N/V/D, cough, SHOB, chest pain behind ribs when coughing or taking a deep breath.   Was given z pack about 2 weeks ago, some relief then felt it got worse. Pt has been taking mucinex, tylenol.  Denies fevers, but endorses chills and cold sweats.

## 2023-01-31 NOTE — Discharge Instructions (Addendum)
You were evaluated today for nausea, vomiting, chills, chest discomfort.  Your workup was overall reassuring.  There was no pneumonia on her chest x-ray.  Your labs were all at baseline.  I have prescribed Zofran for your nausea.  Please take as directed.  Please follow-up with your primary care provider for further evaluation and management of your condition.  If you develop any life-threatening symptoms please return to the emergency department.

## 2023-01-31 NOTE — ED Provider Notes (Signed)
[facility] Provider Note   CSN: [id number] Arrival date & time: 01/31/23  1318     History  Chief Complaint  Patient presents with   Cough   Shortness of Breath    Kristin Bailey is a 62 y.o. female.  Patient presents the emergency department endorsing general malaise for the past month.  She states that she has been feeling weak with nausea, vomiting, diarrhea, cough, shortness of breath, and chest pain when coughing/taking deep breaths for the past month.  Her primary care provider prescribed a Z-Pak approximately 2 weeks ago.  Patient states she had some mild relief but then feels that things got worse again.  She endorses chills and cold sweats but denies fevers.  The patient was here for an outpatient x-ray ordered by her primary care and decided to check in for evaluation in the emergency department.  Past medical history significant for type II DM, depression with anxiety, leukocytosis, fibromyalgia  HPI     Home Medications Prior to Admission medications   Medication Sig Start Date End Date Taking? Authorizing Provider  ondansetron (ZOFRAN) 4 MG tablet Take 1 tablet (4 mg total) by mouth every 8 (eight) hours as needed for nausea or vomiting. 01/31/23  Yes Dorothyann Peng, PA-C  acetaminophen (TYLENOL) 650 MG CR tablet Take 1,300 mg by mouth 2 (two) times daily.    [provider]  ALPRAZolam Duanne Moron) 0.5 MG tablet Take 1 mg by mouth at bedtime. 02/03/21   [provider]  Ascorbic Acid (VITAMIN C PO) Take 2,000 mg by mouth daily.    [provider]  cetirizine (ZYRTEC) 10 MG tablet Take 10 mg by mouth daily.    [provider]  Cholecalciferol (VITAMIN D) 125 MCG (5000 UT) CAPS Take 5,000 Units by mouth daily.    [provider]  docusate sodium (COLACE) 100 MG capsule Take 1 capsule (100 mg total) by mouth 2 (two) times daily. Patient taking differently: Take 300 mg by mouth at  bedtime. 04/26/19   Virl Cagey, MD  DULoxetine (CYMBALTA) 60 MG capsule Take 1 capsule (60 mg total) by mouth 2 (two) times daily. 03/13/19   Norman Clay, MD  gabapentin (NEURONTIN) 300 MG capsule Take 300 mg by mouth at bedtime. 01/17/21   [provider]  glipiZIDE (GLUCOTROL XL) 10 MG 24 hr tablet Take 10 mg by mouth daily. 12/09/20   [provider]  levothyroxine (SYNTHROID, LEVOTHROID) 100 MCG tablet Take 100 mcg by mouth daily before breakfast.  08/16/17   [provider]  lisinopril-hydrochlorothiazide (ZESTORETIC) 20-12.5 MG tablet Take 0.5 tablets by mouth daily.    [provider]  loperamide (IMODIUM) 2 MG capsule Take 1 capsule (2 mg total) by mouth 4 (four) times daily as needed for diarrhea or loose stools. 06/09/20   Hayden Rasmussen, MD  methocarbamol (ROBAXIN) 500 MG tablet Take 1 tablet (500 mg total) by mouth every 6 (six) hours as needed for muscle spasms. 09/14/21   Edmisten, Ok Anis, PA  Misc Natural Products (GLUCOSAMINE CHOND MSM FORMULA) TABS Take 1,500 mg by mouth daily.    [provider]  Multiple Vitamin (MULTIVITAMIN WITH MINERALS) TABS tablet Take 1 tablet by mouth daily. Woman's 50+    [provider]  naphazoline-pheniramine (ALLERGY EYE) 0.025-0.3 % ophthalmic solution Place 1 drop into both eyes 4 (four) times daily as needed for eye irritation or allergies.    [provider]  Omega-3 Fatty Acids (FISH OIL) 1000 MG CAPS Take 1,000 mg by mouth daily.    [provider]  omeprazole (PRILOSEC OTC) 20 MG tablet Take 1 tablet (20 mg total) by mouth daily. Patient taking differently: Take 20 mg by mouth See admin instructions. Every other night 01/03/18   Rehman, Mechele Dawley, MD  oxyCODONE (OXY IR/ROXICODONE) 5 MG immediate release tablet Take 1-2 tablets (5-10 mg total) by mouth every 6 (six) hours as needed for severe pain. 09/14/21   Edmisten, Kristie L, PA  Semaglutide (OZEMPIC, 0.25 OR 0.5  MG/DOSE, [place]) Inject 0.5 mg into the skin once a week.    [provider]  valACYclovir (VALTREX) 1000 MG tablet Take 2,000 mg by mouth See admin instructions. As needed for fever blisters, take 2 tablets twice a day for two doses.    [provider]  zinc gluconate 50 MG tablet Take 50 mg by mouth daily.    [provider]      Allergies    Patient has no known allergies.    Review of Systems   Review of Systems  Constitutional:  Positive for chills and fatigue. Negative for fever.  Respiratory:  Positive for cough and shortness of breath.   Cardiovascular:  Positive for chest pain.  Gastrointestinal:  Positive for nausea. Negative for abdominal pain, constipation and diarrhea.  Genitourinary:  Negative for dysuria.  Neurological:  Negative for syncope and headaches.    Physical Exam Updated Vital Signs BP (!) 140/82   Pulse 86   Temp 97.7 F (36.5 C)   Resp 16   Ht 5\' 6"  (1.676 m)   Wt 127.9 kg   LMP 03/27/2014 Comment: irregular  SpO2 97%   BMI 45.52 kg/m  Physical Exam Vitals and nursing note reviewed.  Constitutional:      General: She is not in acute distress.    Appearance: She is well-developed. She is obese.  HENT:     Head: Normocephalic and atraumatic.  Eyes:     Extraocular Movements: Extraocular movements intact.     Conjunctiva/sclera: Conjunctivae normal.  Cardiovascular:     Rate and Rhythm: Normal rate and regular rhythm.     Heart sounds: No murmur heard. Pulmonary:     Effort: Pulmonary effort is normal. No respiratory distress.     Breath sounds: Normal breath sounds.  Chest:     Chest wall: No tenderness.  Abdominal:     Palpations: Abdomen is soft.     Tenderness: There is no abdominal tenderness.  Musculoskeletal:        General: No swelling.     Cervical back: Neck supple.     Right lower leg: No edema.     Left lower leg: No edema.  Skin:    General: Skin is warm and dry.     Capillary Refill: Capillary  refill takes less than 2 seconds.  Neurological:     Mental Status: She is alert.  Psychiatric:        Mood and Affect: Mood normal.     ED Results / Procedures / Treatments   Labs (all labs ordered are listed, but only abnormal results are displayed) Labs Reviewed  BASIC METABOLIC PANEL - Abnormal; Notable for the following components:      Result Value   Sodium 134 (*)    Chloride 95 (*)    Glucose, Bld 304 (*)    All other components within normal limits  CBC WITH DIFFERENTIAL/PLATELET - Abnormal; Notable  for the following components:   WBC 18.2 (*)    Neutro Abs 13.6 (*)    Abs Immature Granulocytes 0.22 (*)    All other components within normal limits  RESP PANEL BY RT-PCR (RSV, FLU A&B, COVID)  [id number]  URINALYSIS, ROUTINE W REFLEX MICROSCOPIC  TROPONIN I (HIGH SENSITIVITY)    EKG None  Radiology DG Chest 2 View  Result Date: 01/31/2023 CLINICAL DATA:  Cough. Chest pain. Weakness and shortness of breath. EXAM: CHEST - 2 VIEW COMPARISON:  12/12/2018 FINDINGS: The heart size and mediastinal contours are within normal limits. Both lungs are clear. The visualized skeletal structures are unremarkable. IMPRESSION: No active cardiopulmonary disease. Electronically Signed   By: Nelson Chimes M.D.   On: 01/31/2023 14:07    Procedures Procedures    Medications Ordered in ED Medications  sodium chloride 0.9 % bolus 1,000 mL (0 mLs Intravenous Stopped 01/31/23 1515)  ondansetron (ZOFRAN) injection 4 mg (4 mg Intravenous Given 01/31/23 1413)    ED Course/ Medical Decision Making/ A&P             HEART Score: 2                Medical Decision Making Amount and/or Complexity of Data Reviewed Labs: ordered.  Risk Prescription drug management.   Patient presents with chief complaint of malaise.  Differential diagnosis includes was not limited to viral process such as COVID, influenza, RSV; pneumonia; urinary tract infection; others  Patient is comorbidities including  type II DM, fibromyalgia  I reviewed the patient's past medical history including notes showing an office PCP evaluation on March 7  I ordered and reviewed labs.  Pertinent results include WBC 18.2 (baseline WBC around 19), grossly unremarkable BMP, negative respiratory panel, troponin 4, unremarkable UA  I reviewed the chest x-ray performed earlier today which showed no acute disease.  I agree with the radiologist findings.  I ordered the patient a normal saline bolus for fluid resuscitation and Zofran for nausea.  The patient was feeling better upon reassessment.  Admitted changes to medications as appropriate  At this time the cause of the patient's symptoms is unclear.  She is a leukocytosis but has leukocytosis at baseline.  Her chest x-ray was unremarkable.  UA shows no infection at this time.  Viral panel was negative.  Initial troponin of 4 with a nonischemic EKG (initially sinus tachycardia, now in sinus rhythm).  Very low clinical suspicion of ACS.  At this time plan to discharge patient with prescription for Zofran and recommendation for continued follow-up with her primary care provider.  Return precautions provided.       Final Clinical Impression(s) / ED Diagnoses Final diagnoses:  Nausea    Rx / DC Orders ED Discharge Orders          Ordered    ondansetron (ZOFRAN) 4 MG tablet  Every 8 hours PRN        01/31/23 1637              Ronny Bacon 01/31/23 1638    Cristie Hem, MD 01/31/23 1925

## 2023-02-09 DIAGNOSIS — G4733 Obstructive sleep apnea (adult) (pediatric): Secondary | ICD-10-CM | POA: Diagnosis not present

## 2023-02-09 DIAGNOSIS — I10 Essential (primary) hypertension: Secondary | ICD-10-CM | POA: Diagnosis not present

## 2023-06-21 ENCOUNTER — Encounter (INDEPENDENT_AMBULATORY_CARE_PROVIDER_SITE_OTHER): Payer: Self-pay | Admitting: Gastroenterology

## 2023-06-21 ENCOUNTER — Ambulatory Visit (INDEPENDENT_AMBULATORY_CARE_PROVIDER_SITE_OTHER): Payer: Medicare HMO | Admitting: Gastroenterology

## 2023-06-21 VITALS — BP 134/84 | HR 81 | Temp 98.4°F | Ht 66.0 in | Wt 267.0 lb

## 2023-06-21 DIAGNOSIS — Z1211 Encounter for screening for malignant neoplasm of colon: Secondary | ICD-10-CM

## 2023-06-21 DIAGNOSIS — K760 Fatty (change of) liver, not elsewhere classified: Secondary | ICD-10-CM

## 2023-06-21 DIAGNOSIS — K219 Gastro-esophageal reflux disease without esophagitis: Secondary | ICD-10-CM | POA: Diagnosis not present

## 2023-06-21 DIAGNOSIS — K582 Mixed irritable bowel syndrome: Secondary | ICD-10-CM | POA: Diagnosis not present

## 2023-06-21 MED ORDER — PSYLLIUM 58.6 % PO PACK: 1.0000 | PACK | Freq: Two times a day (BID) | ORAL | 2 refills | Status: AC

## 2023-06-21 MED ORDER — POLYETHYLENE GLYCOL 3350 17 G PO PACK: 17.0000 g | PACK | Freq: Two times a day (BID) | ORAL | 0 refills | Status: AC

## 2023-06-21 MED ORDER — PEG 3350-KCL-NA BICARB-NACL 420 G PO SOLR: 4000.0000 mL | Freq: Once | ORAL | 0 refills | Status: AC

## 2023-06-21 MED ORDER — IBGARD 90 MG PO CPCR: 90.0000 mg | ORAL_CAPSULE | Freq: Two times a day (BID) | ORAL | 1 refills | Status: AC

## 2023-06-21 NOTE — Progress Notes (Signed)
Vista Lawman , M.D. Gastroenterology & Hepatology [facility] Gastroenterology [address] [place], [place] [address] Primary Care Physician: Benita Stabile, MD [address] [place] [address]  Chief Complaint:  altered bowel movements with abdominal pain , eructation and CRC Screening   History of Present Illness: Kristin Bailey is a 62 y.o. female with HTN, hypothyroidism, DM Type II, irritable bowel syndrome, GERD, bilateral carpel tunnel release, cholecystectomy, obesity, laparoscopic sigmoid colectomy for diverticulitis and chronic abdominal pain evaluated previoulsy with CT's , Colonoscopy , EGD , Small bowel capsule , negative (AIP) Porphyria  who presents for evaluation of altered bowel movements with abdominal pain , eructation and CRC Screening   Reports that she would have altered bowel movements with alternating with diarrhea([place] stool 6 and 7) and hard stool [place] stool 1-3 and will have days where she did not defecate.  She has not had a bowel movement in the past 3 days now.  She has abdominal pain 3 times a week located diffusely get slightly better after defecation.  Abdominal pain gets mostly better with relaxation techniques and taking alprazolam.  Patient has occasional heartburn and regurgitation at night especially if she does not take pantoprazole.  Patient reports she previously had severe abdominal pain for years which improved after sigmoidoscopy  History: Patient previously seen by Dr.Rehman In 2018 for episodic abdominal pain, nausea, vomiting, and diarrhea since January 2017. Between these episodes, she felt fine. She had been hospitalized several times, but all workups were negative. She had undergone multiple CT scans. In May 2017, an EGD revealed fundic gland polyps, and a duodenal biopsy was negative. An abdominopelvic CT in December 2017 showed scattered wall thickening in loops of the small bowel and the distal half  of the colon, along with mesenteric edema consistent with enteritis. In February of the following year, she underwent a given stool test, which showed no abnormalities in the small bowel. She was also screened for porphyria, and the test was negative. She was last seen in May 2018 and had gone four months without an episode. On 09/07/2017, the patient developed abdominal pain with nausea and vomiting and was seen in the emergency room, where a CT scan suggested sigmoid diverticulitis. She was discharged with prescriptions for Cipro and metronidazole and later underwent sigmoidoscopy . Patient abdominal pain resolved after surgery   With hemoglobin 14.4 platelet 350 The patient denies having any nausea, vomiting, fever, chills, hematochezia, melena, hematemesis, abdominal distention, abdominal pain, diarrhea, jaundice, pruritus or weight loss.  Last EGD:2017 - Z- line irregular, 42 cm from the incisors. - A few gastric polyps. Biopsied. - Normal duodenal bulb and second portion of the duodenum. Biopsied. - Normal hypopharynx.  Last colonoscopy 2015: October 2015 revealing moderate sigmoid colon diverticulosis and adnexal hemorrhoids but no evidence of colitis.  Last flex-sig:2018  Impression: - Erythematous granular mucosa in the mid sigmoid colon. Biopsied. - Diverticulosis in the sigmoid colon. - Internal hemorrhoids.  Comment: Mild segmental colitis at sigmoid colon. This finding would not explain patient's symptom complex. No colonic stricture noted. Barium enema to be performedas outpatient.  SBE 2018  Small amount of food debris in the stomach may suggest gastroparesis at least but this would not explain patient's symptom complex. Normal small bowel study. Next step in evaluation would be diagnostic laparoscopy.  QQV:ZDGLOV colon cancer age 27  Social: neg smoking, alcohol or illicit drug use Surgical:Sigmoidectomy   Past Medical History: Past Medical History:  Diagnosis Date    Anxiety    Arthritis    Asthma    Complication of anesthesia    Depression    Diabetes mellitus without complication (HCC)    Diverticulitis    Fibromyalgia    GERD (gastroesophageal reflux disease)    History of kidney stones    Hypertension    Hypothyroidism    IBS (irritable bowel syndrome)    Pneumonia    PONV (postoperative nausea and vomiting)    Sleep apnea    Thyroid disease     Past Surgical History: Past Surgical History:  Procedure Laterality Date   AGILE CAPSULE N/A 11/23/2016   Procedure: AGILE CAPSULE;  Surgeon: Malissa Hippo, MD;  [facility]: [facility];  Service: Endoscopy;  Laterality: N/A;  8:30   BREAST EXCISIONAL BIOPSY Right    CARPAL TUNNEL RELEASE     CESAREAN SECTION     CHOLECYSTECTOMY     COLONOSCOPY N/A 08/28/2014   Procedure: COLONOSCOPY;  Surgeon: Malissa Hippo, MD;  [facility]: [facility];  Service: Endoscopy;  Laterality: N/A;  1030   CYST REMOVAL HAND     ESOPHAGOGASTRODUODENOSCOPY N/A 03/19/2016   Procedure: ESOPHAGOGASTRODUODENOSCOPY (EGD);  Surgeon: Malissa Hippo, MD;  [facility]: [facility];  Service: Endoscopy;  Laterality: N/A;   FLEXIBLE SIGMOIDOSCOPY N/A 09/28/2017   Procedure: FLEXIBLE SIGMOIDOSCOPY;  Surgeon: Malissa Hippo, MD;  [facility]: [facility];  Service: Endoscopy;  Laterality: N/A;   FRACTURE SURGERY     rt ankle    GIVENS CAPSULE STUDY N/A 12/15/2016   Procedure: GIVENS CAPSULE STUDY;  Surgeon: Malissa Hippo, MD;  [facility]: [facility];  Service: Endoscopy;  Laterality: N/A;   KNEE SURGERY     meniscus repair   LAPAROSCOPIC PARTIAL COLECTOMY N/A 04/22/2019   Procedure: LAPAROSCOPIC PARTIAL SIGMOID COLECTOMY;  Surgeon: Lucretia Roers, MD;  Location: [facility];  Service: General;  Laterality: N/A;   TOTAL KNEE ARTHROPLASTY Left 06/21/2021   Procedure: TOTAL KNEE ARTHROPLASTY;  Surgeon: Ollen Gross, MD;  Location: [facility];  Service: Orthopedics;  Laterality: Left;   TOTAL KNEE ARTHROPLASTY Right 09/13/2021    Procedure: TOTAL KNEE ARTHROPLASTY;  Surgeon: Ollen Gross, MD;  Location: [facility];  Service: Orthopedics;  Laterality: Right;   TUBAL LIGATION      Family History: Family History  Problem Relation Age of Onset   Cancer Mother        colon cancer age74   Hypertension Mother    Stroke Mother    Diabetes Mother    Non-Hodgkin's lymphoma Father    Hypertension Father    Diabetes Father    Stroke Maternal Grandmother    Kidney disease Paternal Aunt    Cancer Paternal Aunt    Kidney disease Paternal Uncle    Cancer Paternal Uncle     Social History: Social History   Tobacco Use  Smoking Status Never  Smokeless Tobacco Never   Social History   Substance and Sexual Activity  Alcohol Use No   Social History   Substance and Sexual Activity  Drug Use No    Allergies: No Known Allergies  Medications: Current Outpatient Medications  Medication Sig Dispense Refill   acetaminophen (TYLENOL) 650 MG CR tablet Take 1,300 mg by mouth. Takes 2 every morning     ALPRAZolam (XANAX) 0.5 MG tablet Take 1 mg by mouth at bedtime.     aspirin EC 81 MG tablet Take 81 mg by mouth daily. Swallow whole.  buPROPion (WELLBUTRIN XL) 150 MG 24 hr tablet Take 150 mg by mouth daily.     cetirizine (ZYRTEC) 10 MG tablet Take 10 mg by mouth daily.     Cholecalciferol (VITAMIN D) 125 MCG (5000 UT) CAPS Take 5,000 Units by mouth daily.     docusate sodium (COLACE) 100 MG capsule Take 1 capsule (100 mg total) by mouth 2 (two) times daily. (Patient taking differently: Take 300 mg by mouth at bedtime.) 60 capsule 1   DULoxetine (CYMBALTA) 60 MG capsule Take 1 capsule (60 mg total) by mouth 2 (two) times daily. 180 capsule 0   gabapentin (NEURONTIN) 300 MG capsule Take 300 mg by mouth at bedtime.     glipiZIDE (GLUCOTROL XL) 10 MG 24 hr tablet Take 10 mg by mouth daily.     levothyroxine (SYNTHROID, LEVOTHROID) 100 MCG tablet Take 100 mcg by mouth daily before breakfast.   1    lisinopril-hydrochlorothiazide (ZESTORETIC) 20-12.5 MG tablet Take 0.5 tablets by mouth daily.     Multiple Vitamin (MULTIVITAMIN WITH MINERALS) TABS tablet Take 1 tablet by mouth daily. Woman's 50+     naphazoline-pheniramine (ALLERGY EYE) 0.025-0.3 % ophthalmic solution Place 1 drop into both eyes 4 (four) times daily as needed for eye irritation or allergies.     omeprazole (PRILOSEC OTC) 20 MG tablet Take 1 tablet (20 mg total) by mouth daily. (Patient taking differently: Take 20 mg by mouth See admin instructions. Every other night) 20 tablet 5   ondansetron (ZOFRAN) 4 MG tablet Take 1 tablet (4 mg total) by mouth every 8 (eight) hours as needed for nausea or vomiting. 20 tablet 0   polyethylene glycol (MIRALAX / GLYCOLAX) 17 g packet Take 17 g by mouth 2 (two) times daily. 180 packet 0   psyllium (METAMUCIL) 58.6 % packet Take 1 packet by mouth 2 (two) times daily. 60 packet 2   rosuvastatin (CRESTOR) 10 MG tablet Take 10 mg by mouth daily.     Semaglutide (OZEMPIC, 0.25 OR 0.5 MG/DOSE, [place]) Inject 0.5 mg into the skin once a week.     valACYclovir (VALTREX) 1000 MG tablet Take 2,000 mg by mouth See admin instructions. As needed for fever blisters, take 2 tablets twice a day for two doses.     No current facility-administered medications for this visit.    Review of Systems: GENERAL: negative for malaise, night sweats HEENT: No changes in hearing or vision, no nose bleeds or other nasal problems. NECK: Negative for lumps, goiter, pain and significant neck swelling RESPIRATORY: Negative for cough, wheezing CARDIOVASCULAR: Negative for chest pain, leg swelling, palpitations, orthopnea GI: SEE HPI MUSCULOSKELETAL: Negative for joint pain or swelling, back pain, and muscle pain. SKIN: Negative for lesions, rash HEMATOLOGY Negative for prolonged bleeding, bruising easily, and swollen nodes. ENDOCRINE: Negative for cold or heat intolerance, polyuria, polydipsia and goiter. NEURO: negative for  tremor, gait imbalance, syncope and seizures. The remainder of the review of systems is noncontributory.   Physical Exam: BP 134/84   Pulse 81   Temp 98.4 F (36.9 C) (Oral)   Ht 5\' 6"  (1.676 m)   Wt 267 lb (121.1 kg)   LMP 03/27/2014 Comment: irregular  BMI 43.09 kg/m  GENERAL: The patient is AO x3, in no acute distress. HEENT: Head is normocephalic and atraumatic. EOMI are intact. Mouth is well hydrated and without lesions. NECK: Supple. No masses LUNGS: Clear to auscultation. No presence of rhonchi/wheezing/rales. Adequate chest expansion HEART: RRR, normal s1 and s2. ABDOMEN: Soft,  nontender, no guarding, no peritoneal signs, and nondistended. BS +. No masses. EXTREMITIES: Without any cyanosis, clubbing, rash, lesions or edema. NEUROLOGIC: AOx3, no focal motor deficit. SKIN: no jaundice, no rashes   Imaging/Labs: as above  I personally reviewed and interpreted the available labs, imaging and endoscopic files.  CT Abdomen and Pelvis 05/2022 without contrast   IMPRESSION: 1. No acute abnormality identified in the abdomen and pelvis. 2. No kidney stones or hydronephrosis is identified. 3. Fatty infiltration of liver.  Impression and Plan: Kristin Bailey is a 62 y.o. female with HTN, hypothyroidism, DM Type II, irritable bowel syndrome, GERD, bilateral carpel tunnel release, cholecystectomy, obesity, laparoscopic sigmoid colectomy for diverticulitis and chronic abdominal pain evaluated previoulsy with CT's , Colonoscopy , EGD , Small bowel capsule , negative (AIP) Porphyria  who presents for evaluation of altered bowel movements with abdominal pain , eructation and CRC Screening   #Altered bowel movements with abdominal pain   This could be IBS mixed type or overflow diarrhea or symptomatic uncomplicated diverticular disease(SUDD)  Patient has alternating bowel movements with abdominal pain improves after defecation typical of IBS-M  Occasional hard stools days without  going to the bathroom has overflow diarrhea is another differential  Bowel pain improved after laxation techniques and I wonder if underlying anxiety as a role in patient condition  Also given after sigmoidectomy patient chronic abdominal pain resolved could be underlying SUDD  Recommendation   Adequate fluid intake , 8 glass of water daily High fiber diet : Dates, prunes , pears, Kiwi  Miralax BID followed by Daily  Metamucil BID   Explained presumed etiology of IBS symptoms. Patient was counseled about the benefit of implementing a low FODMAP to improve symptoms and recurrent episodes. A dietary list was provided to the patient. Also, the patient was counseled about the benefit of avoiding stressing situations and potential environmental triggers leading to symptomatology. - Start IBGard 1 tablet every 8-12 hours as needed   #GERD Patient with typical symptoms of GERD with heartburn and is Complaining of eructation.  This is exacerbated with truncal obesity  He has been taking PPI correctly and is recommended PPI 30 minutes before breakfast  Recs:  1) Avoid coffee, tea, cola beverages, carbonated beverages, spicy foods, greasy foods, foods high in acid content (e.g. tomatoes and citrus fruits), chocolate, and peppermint 2) Avoid drinking alcoholic beverages 3) Avoid smoking 4) Eat small meals and keep weight within normal range 5) Avoid recumbent posture for 3 hours post-prandially 6) Elevate head of bed   #Fatty liver disease  Normal liver enzymes 2022 Can check Liver enzymes with next blood work  Recommendation :     - walking at a brisk pace/biking at moderate intesity 2.5-5 hours per week     - use pedometer/step counter to track activity     - goal to lose 5-10% of initial body weight     - avoid suagry drinks and juices, use zero calorie beverages     - increase water intake     - eat a low carb diet with plenty of veggies and fruit     - Get sufficient sleep 7-8  hrs nightly     - maitain active lifestyle     - avoid alcohol     - recommend 2-3 cups Coffee daily     - Counsel on lowering cholesterol by having a diet rich in vegetables,          protein (avoid red meats) and  good fats(fish, salmon).  #BMI 43 - Diet and exercise as above - Consider semaglutide as above. Other medications that may be used include orlistat, Qsymia, Contrave - Can also consider endoscopic bariatric surgery  #CRC Screening  # Family history of colon cancer  She last completed colonoscopy in 2015 followed by a flexible sigmoidoscopy in 2016. Patient mother had colorectal cancer although at age 44  Will schedule for colorectal cancer screening with colonoscopy  All questions were answered.      Vista Lawman, MD Gastroenterology and Hepatology [facility] Gastroenterology   This chart has been completed using [facility] Dictation software, and while attempts have been made to ensure accuracy , certain words and phrases may not be transcribed as intended

## 2023-06-21 NOTE — H&P (View-Only) (Signed)
 Vista Lawman , M.D. Gastroenterology & Hepatology [facility] [address] [place], [place] [address] Primary Care Physician: Kristin Stabile, MD [address] [place] [address]  Chief Complaint:  altered bowel movements with abdominal pain , eructation and CRC Screening   History of Present Illness: Kristin Bailey is a 62 y.o. female with HTN, hypothyroidism, DM Type II, irritable bowel syndrome, GERD, bilateral carpel tunnel release, cholecystectomy, obesity, laparoscopic sigmoid colectomy for diverticulitis and chronic abdominal pain evaluated previoulsy with CT's , Colonoscopy , EGD , Small bowel capsule , negative (AIP) Porphyria  who presents for evaluation of altered bowel movements with abdominal pain , eructation and CRC Screening   Reports that she would have altered bowel movements with alternating with diarrhea(Bristol stool 6 and 7) and hard stool Bristol stool 1-3 and will have days where she did not defecate.  She has not had a bowel movement in the past 3 days now.  She has abdominal pain 3 times a week located diffusely get slightly better after defecation.  Abdominal pain gets mostly better with relaxation techniques and taking alprazolam.  Patient has occasional heartburn and regurgitation at night especially if she does not take pantoprazole.  Patient reports she previously had severe abdominal pain for years which improved after sigmoidoscopy  History: Patient previously seen by Dr.Rehman In 2018 for episodic abdominal pain, nausea, vomiting, and diarrhea since January 2017. Between these episodes, she felt fine. She had been hospitalized several times, but all workups were negative. She had undergone multiple CT scans. In May 2017, an EGD revealed fundic gland polyps, and a duodenal biopsy was negative. An abdominopelvic CT in December 2017 showed scattered wall thickening in loops of the small bowel and the distal half  of the colon, along with mesenteric edema consistent with enteritis. In February of the following year, she underwent a given stool test, which showed no abnormalities in the small bowel. She was also screened for porphyria, and the test was negative. She was last seen in May 2018 and had gone four months without an episode. On 09/07/2017, the patient developed abdominal pain with nausea and vomiting and was seen in the emergency room, where a CT scan suggested sigmoid diverticulitis. She was discharged with prescriptions for Cipro and metronidazole and later underwent sigmoidoscopy . Patient abdominal pain resolved after surgery   With hemoglobin 14.4 platelet 350 The patient denies having any nausea, vomiting, fever, chills, hematochezia, melena, hematemesis, abdominal distention, abdominal pain, diarrhea, jaundice, pruritus or weight loss.  Last EGD:2017 - Z- line irregular, 42 cm from the incisors. - A few gastric polyps. Biopsied. - Normal duodenal bulb and second portion of the duodenum. Biopsied. - Normal hypopharynx.  Last colonoscopy 2015: October 2015 revealing moderate sigmoid colon diverticulosis and adnexal hemorrhoids but no evidence of colitis.  Last flex-sig:2018  Impression: - Erythematous granular mucosa in the mid sigmoid colon. Biopsied. - Diverticulosis in the sigmoid colon. - Internal hemorrhoids.  Comment: Mild segmental colitis at sigmoid colon. This finding would not explain patient's symptom complex. No colonic stricture noted. Barium enema to be performedas outpatient.  SBE 2018  Small amount of food debris in the stomach may suggest gastroparesis at least but this would not explain patient's symptom complex. Normal small bowel study. Next step in evaluation would be diagnostic laparoscopy.  QQV:ZDGLOV colon cancer age 27  Social: neg smoking, alcohol or illicit drug use Surgical:Sigmoidectomy   Past Medical History: Past Medical History:  Diagnosis Date    Anxiety    Arthritis    Asthma    Complication of anesthesia    Depression    Diabetes mellitus without complication (HCC)    Diverticulitis    Fibromyalgia    GERD (gastroesophageal reflux disease)    History of kidney stones    Hypertension    Hypothyroidism    IBS (irritable bowel syndrome)    Pneumonia    PONV (postoperative nausea and vomiting)    Sleep apnea    Thyroid disease     Past Surgical History: Past Surgical History:  Procedure Laterality Date   AGILE CAPSULE N/A 11/23/2016   Procedure: AGILE CAPSULE;  Surgeon: Malissa Hippo, MD;  [facility];  Service: Endoscopy;  Laterality: N/A;  8:30   BREAST EXCISIONAL BIOPSY Right    CARPAL TUNNEL RELEASE     CESAREAN SECTION     CHOLECYSTECTOMY     COLONOSCOPY N/A 08/28/2014   Procedure: COLONOSCOPY;  Surgeon: Malissa Hippo, MD;  [facility]: [facility];  Service: Endoscopy;  Laterality: N/A;  1030   CYST REMOVAL HAND     ESOPHAGOGASTRODUODENOSCOPY N/A 03/19/2016   Procedure: ESOPHAGOGASTRODUODENOSCOPY (EGD);  Surgeon: Malissa Hippo, MD;  [facility]: [facility];  Service: Endoscopy;  Laterality: N/A;   FLEXIBLE SIGMOIDOSCOPY N/A 09/28/2017   Procedure: FLEXIBLE SIGMOIDOSCOPY;  Surgeon: Malissa Hippo, MD;  [facility];  Service: Endoscopy;  Laterality: N/A;   FRACTURE SURGERY     rt ankle    GIVENS CAPSULE STUDY N/A 12/15/2016   Procedure: GIVENS CAPSULE STUDY;  Surgeon: Malissa Hippo, MD;  [facility]: [facility];  Service: Endoscopy;  Laterality: N/A;   KNEE SURGERY     meniscus repair   LAPAROSCOPIC PARTIAL COLECTOMY N/A 04/22/2019   Procedure: LAPAROSCOPIC PARTIAL SIGMOID COLECTOMY;  Surgeon: Lucretia Roers, MD;  Location: [facility];  Service: General;  Laterality: N/A;   TOTAL KNEE ARTHROPLASTY Left 06/21/2021   Procedure: TOTAL KNEE ARTHROPLASTY;  Surgeon: Ollen Gross, MD;  Location: [facility];  Service: Orthopedics;  Laterality: Left;   TOTAL KNEE ARTHROPLASTY Right 09/13/2021    Procedure: TOTAL KNEE ARTHROPLASTY;  Surgeon: Ollen Gross, MD;  Location: [facility];  Service: Orthopedics;  Laterality: Right;   TUBAL LIGATION      Family History: Family History  Problem Relation Age of Onset   Cancer Mother        colon cancer age74   Hypertension Mother    Stroke Mother    Diabetes Mother    Non-Hodgkin's lymphoma Father    Hypertension Father    Diabetes Father    Stroke Maternal Grandmother    Kidney disease Paternal Aunt    Cancer Paternal Aunt    Kidney disease Paternal Uncle    Cancer Paternal Uncle     Social History: Social History   Tobacco Use  Smoking Status Never  Smokeless Tobacco Never   Social History   Substance and Sexual Activity  Alcohol Use No   Social History   Substance and Sexual Activity  Drug Use No    Allergies: No Known Allergies  Medications: Current Outpatient Medications  Medication Sig Dispense Refill   acetaminophen (TYLENOL) 650 MG CR tablet Take 1,300 mg by mouth. Takes 2 every morning     ALPRAZolam (XANAX) 0.5 MG tablet Take 1 mg by mouth at bedtime.     aspirin EC 81 MG tablet Take 81 mg by mouth daily. Swallow whole.  buPROPion (WELLBUTRIN XL) 150 MG 24 hr tablet Take 150 mg by mouth daily.     cetirizine (ZYRTEC) 10 MG tablet Take 10 mg by mouth daily.     Cholecalciferol (VITAMIN D) 125 MCG (5000 UT) CAPS Take 5,000 Units by mouth daily.     docusate sodium (COLACE) 100 MG capsule Take 1 capsule (100 mg total) by mouth 2 (two) times daily. (Patient taking differently: Take 300 mg by mouth at bedtime.) 60 capsule 1   DULoxetine (CYMBALTA) 60 MG capsule Take 1 capsule (60 mg total) by mouth 2 (two) times daily. 180 capsule 0   gabapentin (NEURONTIN) 300 MG capsule Take 300 mg by mouth at bedtime.     glipiZIDE (GLUCOTROL XL) 10 MG 24 hr tablet Take 10 mg by mouth daily.     levothyroxine (SYNTHROID, LEVOTHROID) 100 MCG tablet Take 100 mcg by mouth daily before breakfast.   1    lisinopril-hydrochlorothiazide (ZESTORETIC) 20-12.5 MG tablet Take 0.5 tablets by mouth daily.     Multiple Vitamin (MULTIVITAMIN WITH MINERALS) TABS tablet Take 1 tablet by mouth daily. Woman's 50+     naphazoline-pheniramine (ALLERGY EYE) 0.025-0.3 % ophthalmic solution Place 1 drop into both eyes 4 (four) times daily as needed for eye irritation or allergies.     omeprazole (PRILOSEC OTC) 20 MG tablet Take 1 tablet (20 mg total) by mouth daily. (Patient taking differently: Take 20 mg by mouth See admin instructions. Every other night) 20 tablet 5   ondansetron (ZOFRAN) 4 MG tablet Take 1 tablet (4 mg total) by mouth every 8 (eight) hours as needed for nausea or vomiting. 20 tablet 0   polyethylene glycol (MIRALAX / GLYCOLAX) 17 g packet Take 17 g by mouth 2 (two) times daily. 180 packet 0   psyllium (METAMUCIL) 58.6 % packet Take 1 packet by mouth 2 (two) times daily. 60 packet 2   rosuvastatin (CRESTOR) 10 MG tablet Take 10 mg by mouth daily.     Semaglutide (OZEMPIC, 0.25 OR 0.5 MG/DOSE, [place]) Inject 0.5 mg into the skin once a week.     valACYclovir (VALTREX) 1000 MG tablet Take 2,000 mg by mouth See admin instructions. As needed for fever blisters, take 2 tablets twice a day for two doses.     No current facility-administered medications for this visit.    Review of Systems: GENERAL: negative for malaise, night sweats HEENT: No changes in hearing or vision, no nose bleeds or other nasal problems. NECK: Negative for lumps, goiter, pain and significant neck swelling RESPIRATORY: Negative for cough, wheezing CARDIOVASCULAR: Negative for chest pain, leg swelling, palpitations, orthopnea GI: SEE HPI MUSCULOSKELETAL: Negative for joint pain or swelling, back pain, and muscle pain. SKIN: Negative for lesions, rash HEMATOLOGY Negative for prolonged bleeding, bruising easily, and swollen nodes. ENDOCRINE: Negative for cold or heat intolerance, polyuria, polydipsia and goiter. NEURO: negative for  tremor, gait imbalance, syncope and seizures. The remainder of the review of systems is noncontributory.   Physical Exam: BP 134/84   Pulse 81   Temp 98.4 F (36.9 C) (Oral)   Ht 5\' 6"  (1.676 m)   Wt 267 lb (121.1 kg)   LMP 03/27/2014 Comment: irregular  BMI 43.09 kg/m  GENERAL: The patient is AO x3, in no acute distress. HEENT: Head is normocephalic and atraumatic. EOMI are intact. Mouth is well hydrated and without lesions. NECK: Supple. No masses LUNGS: Clear to auscultation. No presence of rhonchi/wheezing/rales. Adequate chest expansion HEART: RRR, normal s1 and s2. ABDOMEN: Soft,  nontender, no guarding, no peritoneal signs, and nondistended. BS +. No masses. EXTREMITIES: Without any cyanosis, clubbing, rash, lesions or edema. NEUROLOGIC: AOx3, no focal motor deficit. SKIN: no jaundice, no rashes   Imaging/Labs: as above  I personally reviewed and interpreted the available labs, imaging and endoscopic files.  CT Abdomen and Pelvis 05/2022 without contrast   IMPRESSION: 1. No acute abnormality identified in the abdomen and pelvis. 2. No kidney stones or hydronephrosis is identified. 3. Fatty infiltration of liver.  Impression and Plan: Kristin Bailey is a 62 y.o. female with HTN, hypothyroidism, DM Type II, irritable bowel syndrome, GERD, bilateral carpel tunnel release, cholecystectomy, obesity, laparoscopic sigmoid colectomy for diverticulitis and chronic abdominal pain evaluated previoulsy with CT's , Colonoscopy , EGD , Small bowel capsule , negative (AIP) Porphyria  who presents for evaluation of altered bowel movements with abdominal pain , eructation and CRC Screening   #Altered bowel movements with abdominal pain   This could be IBS mixed type or overflow diarrhea or symptomatic uncomplicated diverticular disease(SUDD)  Patient has alternating bowel movements with abdominal pain improves after defecation typical of IBS-M  Occasional hard stools days without  going to the bathroom has overflow diarrhea is another differential  Bowel pain improved after laxation techniques and I wonder if underlying anxiety as a role in patient condition  Also given after sigmoidectomy patient chronic abdominal pain resolved could be underlying SUDD  Recommendation   Adequate fluid intake , 8 glass of water daily High fiber diet : Dates, prunes , pears, Kiwi  Miralax BID followed by Daily  Metamucil BID   Explained presumed etiology of IBS symptoms. Patient was counseled about the benefit of implementing a low FODMAP to improve symptoms and recurrent episodes. A dietary list was provided to the patient. Also, the patient was counseled about the benefit of avoiding stressing situations and potential environmental triggers leading to symptomatology. - Start IBGard 1 tablet every 8-12 hours as needed   #GERD Patient with typical symptoms of GERD with heartburn and is Complaining of eructation.  This is exacerbated with truncal obesity  He has been taking PPI correctly and is recommended PPI 30 minutes before breakfast  Recs:  1) Avoid coffee, tea, cola beverages, carbonated beverages, spicy foods, greasy foods, foods high in acid content (e.g. tomatoes and citrus fruits), chocolate, and peppermint 2) Avoid drinking alcoholic beverages 3) Avoid smoking 4) Eat small meals and keep weight within normal range 5) Avoid recumbent posture for 3 hours post-prandially 6) Elevate head of bed   #Fatty liver disease  Normal liver enzymes 2022 Can check Liver enzymes with next blood work  Recommendation :     - walking at a brisk pace/biking at moderate intesity 2.5-5 hours per week     - use pedometer/step counter to track activity     - goal to lose 5-10% of initial body weight     - avoid suagry drinks and juices, use zero calorie beverages     - increase water intake     - eat a low carb diet with plenty of veggies and fruit     - Get sufficient sleep 7-8  hrs nightly     - maitain active lifestyle     - avoid alcohol     - recommend 2-3 cups Coffee daily     - Counsel on lowering cholesterol by having a diet rich in vegetables,          protein (avoid red meats) and  good fats(fish, salmon).  #BMI 43 - Diet and exercise as above - Consider semaglutide as above. Other medications that may be used include orlistat, Qsymia, Contrave - Can also consider endoscopic bariatric surgery  #CRC Screening  # Family history of colon cancer  She last completed colonoscopy in 2015 followed by a flexible sigmoidoscopy in 2016. Patient mother had colorectal cancer although at age 44  Will schedule for colorectal cancer screening with colonoscopy  All questions were answered.      Vista Lawman, MD Gastroenterology and Hepatology [facility] Gastroenterology   This chart has been completed using [facility] Dictation software, and while attempts have been made to ensure accuracy , certain words and phrases may not be transcribed as intended

## 2023-06-21 NOTE — Patient Instructions (Signed)
It was very nice to meet you today, as dicussed with will plan for the following :  1)  Adequate fluid intake , 8 glass of water daily High fiber diet : Dates, prunes , pears, Kiwi  Miralax twice daily followed by Daily  Metamucil twice daily  2) Colonoscopy

## 2023-06-29 ENCOUNTER — Telehealth (INDEPENDENT_AMBULATORY_CARE_PROVIDER_SITE_OTHER): Payer: Self-pay | Admitting: Gastroenterology

## 2023-06-29 NOTE — Telephone Encounter (Signed)
Pt contacted and made aware of pre op appt that is scheduled for 07/11/23 at 8:30am

## 2023-07-10 NOTE — Patient Instructions (Signed)
Kristin Bailey  07/10/2023     @PREFPERIOPPHARMACY @   Your procedure is scheduled on  07/13/2023.   [facility] to [facility] at  0945 A.M.   Call this number if you have problems the morning of surgery:  [phone]  If you experience any cold or flu symptoms such as cough, fever, chills, shortness of breath, etc. between now and your scheduled surgery, please notify us at the above number.   Remember:  Follow the diet and prep instructions given to you by the office.        Your last dose of Ozempic should be on 07/05/2023.      DO NOT take any medications for diabetes the morning of your procedure.     Take these medicines the morning of surgery with A SIP OF WATER         wellbutrin, zyrtec, cymbalta, levothyroxine, zofran (if needed).     Do not wear jewelry, make-up or nail polish, including gel polish,  artificial nails, or any other type of covering on natural nails (fingers and  toes).  Do not wear lotions, powders, or perfumes, or deodorant.  Do not shave 48 hours prior to surgery.  Men may shave face and neck.  Do not bring valuables to the hospital.  [facility] is not responsible for any belongings or valuables.  Contacts, dentures or bridgework may not be worn into surgery.  Leave your suitcase in the car.  After surgery it may be brought to your room.  For patients admitted to the hospital, discharge time will be determined by your treatment team.  Patients discharged the day of surgery will not be allowed to drive home and must have someone with them for 24 hours.    Special instructions:   DO NOT smoke tobacco or vape for 24 hours before your procedure.  Please read over the following fact sheets that you were given. Anesthesia Post-op Instructions and Care and Recovery After Surgery      Colonoscopy, Adult, Care After The following information offers guidance on how to care for yourself after your procedure. Your health care provider may also  give you more specific instructions. If you have problems or questions, contact your health care provider. What can I expect after the procedure? After the procedure, it is common to have: A small amount of blood in your stool for 24 hours after the procedure. Some gas. Mild cramping or bloating of your abdomen. Follow these instructions at home: Eating and drinking  Drink enough fluid to keep your urine pale yellow. Follow instructions from your health care provider about eating or drinking restrictions. Resume your normal diet as told by your health care provider. Avoid heavy or fried foods that are hard to digest. Activity Rest as told by your health care provider. Avoid sitting for a long time without moving. Get up to take short walks every 1-2 hours. This is important to improve blood flow and breathing. Ask for help if you feel weak or unsteady. Return to your normal activities as told by your health care provider. Ask your health care provider what activities are safe for you. Managing cramping and bloating  Try walking around when you have cramps or feel bloated. If directed, apply heat to your abdomen as told by your health care provider. Use the heat source that your health care provider recommends, such as a moist heat pack or a heating pad. Place a towel between your skin  and the heat source. Leave the heat on for 20-30 minutes. Remove the heat if your skin turns bright red. This is especially important if you are unable to feel pain, heat, or cold. You have a greater risk of getting burned. General instructions If you were given a sedative during the procedure, it can affect you for several hours. Do not drive or operate machinery until your health care provider says that it is safe. For the first 24 hours after the procedure: Do not sign important documents. Do not drink alcohol. Do your regular daily activities at a slower pace than normal. Eat soft foods that are easy to  digest. Take over-the-counter and prescription medicines only as told by your health care provider. Keep all follow-up visits. This is important. Contact a health care provider if: You have blood in your stool 2-3 days after the procedure. Get help right away if: You have more than a small spotting of blood in your stool. You have large blood clots in your stool. You have swelling of your abdomen. You have nausea or vomiting. You have a fever. You have increasing pain in your abdomen that is not relieved with medicine. These symptoms may be an emergency. Get help right away. Call 911. Do not wait to see if the symptoms will go away. Do not drive yourself to the hospital. Summary After the procedure, it is common to have a small amount of blood in your stool. You may also have mild cramping and bloating of your abdomen. If you were given a sedative during the procedure, it can affect you for several hours. Do not drive or operate machinery until your health care provider says that it is safe. Get help right away if you have a lot of blood in your stool, nausea or vomiting, a fever, or increased pain in your abdomen. This information is not intended to replace advice given to you by your health care provider. Make sure you discuss any questions you have with your health care provider. Document Revised: 12/13/2022 Document Reviewed: 06/23/2021 Elsevier Patient Education  2024 [employer] Monitored Anesthesia Care, Care After The following information offers guidance on how to care for yourself after your procedure. Your health care provider may also give you more specific instructions. If you have problems or questions, contact your health care provider. What can I expect after the procedure? After the procedure, it is common to have: Tiredness. Little or no memory about what happened during or after the procedure. Impaired judgment when it comes to making decisions. Nausea or  vomiting. Some trouble with balance. Follow these instructions at home: For the time period you were told by your health care provider:  Rest. Do not participate in activities where you could fall or become injured. Do not drive or use machinery. Do not drink alcohol. Do not take sleeping pills or medicines that cause drowsiness. Do not make important decisions or sign legal documents. Do not take care of children on your own. Medicines Take over-the-counter and prescription medicines only as told by your health care provider. If you were prescribed antibiotics, take them as told by your health care provider. Do not stop using the antibiotic even if you start to feel better. Eating and drinking Follow instructions from your health care provider about what you may eat and drink. Drink enough fluid to keep your urine pale yellow. If you vomit: Drink clear fluids slowly and in small amounts as you are able. Clear fluids include water,  ice chips, low-calorie sports drinks, and fruit juice that has water added to it (diluted fruit juice). Eat light and bland foods in small amounts as you are able. These foods include bananas, applesauce, rice, lean meats, toast, and crackers. General instructions  Have a responsible adult stay with you for the time you are told. It is important to have someone help care for you until you are awake and alert. If you have sleep apnea, surgery and some medicines can increase your risk for breathing problems. Follow instructions from your health care provider about wearing your sleep device: When you are sleeping. This includes during daytime naps. While taking prescription pain medicines, sleeping medicines, or medicines that make you drowsy. Do not use any products that contain nicotine or tobacco. These products include cigarettes, chewing tobacco, and vaping devices, such as e-cigarettes. If you need help quitting, ask your health care provider. Contact a  health care provider if: You feel nauseous or vomit every time you eat or drink. You feel light-headed. You are still sleepy or having trouble with balance after 24 hours. You get a rash. You have a fever. You have redness or swelling around the IV site. Get help right away if: You have trouble breathing. You have new confusion after you get home. These symptoms may be an emergency. Get help right away. Call 911. Do not wait to see if the symptoms will go away. Do not drive yourself to the hospital. This information is not intended to replace advice given to you by your health care provider. Make sure you discuss any questions you have with your health care provider. Document Revised: 03/28/2022 Document Reviewed: 03/28/2022 Elsevier Patient Education  2024 [employer].

## 2023-07-11 ENCOUNTER — Encounter (HOSPITAL_COMMUNITY)
Admission: RE | Admit: 2023-07-11 | Discharge: 2023-07-11 | Disposition: A | Discharge status: Home / Self Care | Payer: Medicare HMO | Source: Ambulatory Visit | Attending: Gastroenterology | Admitting: Gastroenterology

## 2023-07-11 ENCOUNTER — Encounter (HOSPITAL_COMMUNITY): Payer: Self-pay

## 2023-07-11 VITALS — BP 134/84 | HR 81 | Temp 98.4°F | Resp 18 | Ht 66.0 in | Wt 267.0 lb

## 2023-07-11 DIAGNOSIS — Z01812 Encounter for preprocedural laboratory examination: Secondary | ICD-10-CM | POA: Insufficient documentation

## 2023-07-11 DIAGNOSIS — E119 Type 2 diabetes mellitus without complications: Secondary | ICD-10-CM | POA: Insufficient documentation

## 2023-07-11 LAB — BASIC METABOLIC PANEL
Anion gap: 11 (ref 5–15)
BUN: 19 mg/dL (ref 8–23)
CO2: 25 mmol/L (ref 22–32)
Calcium: 9.1 mg/dL (ref 8.9–10.3)
Chloride: 99 mmol/L (ref 98–111)
Creatinine, Ser: 0.67 mg/dL (ref 0.44–1.00)
GFR, Estimated: >60 mL/min (ref >60)
Glucose, Bld: 203 mg/dL — ABNORMAL HIGH (ref 70–99)
Potassium: 4.6 mmol/L (ref 3.5–5.1)
Sodium: 135 mmol/L (ref 135–145)

## 2023-07-13 ENCOUNTER — Ambulatory Visit (HOSPITAL_BASED_OUTPATIENT_CLINIC_OR_DEPARTMENT_OTHER): Payer: Medicare HMO | Admitting: Certified Registered"

## 2023-07-13 ENCOUNTER — Encounter (HOSPITAL_COMMUNITY)
Admission: RE | Disposition: A | Discharge status: Home / Self Care | Payer: Self-pay | Source: Home / Self Care | Attending: Gastroenterology

## 2023-07-13 ENCOUNTER — Ambulatory Visit (HOSPITAL_COMMUNITY)
Admission: RE | Admit: 2023-07-13 | Discharge: 2023-07-13 | Disposition: A | Discharge status: Home / Self Care | Payer: Medicare HMO | Attending: Gastroenterology | Admitting: Gastroenterology

## 2023-07-13 ENCOUNTER — Encounter (HOSPITAL_COMMUNITY): Payer: Self-pay

## 2023-07-13 ENCOUNTER — Ambulatory Visit (HOSPITAL_COMMUNITY): Payer: Medicare HMO | Admitting: Certified Registered"

## 2023-07-13 DIAGNOSIS — Z1211 Encounter for screening for malignant neoplasm of colon: Secondary | ICD-10-CM

## 2023-07-13 DIAGNOSIS — K649 Unspecified hemorrhoids: Secondary | ICD-10-CM | POA: Diagnosis not present

## 2023-07-13 DIAGNOSIS — K635 Polyp of colon: Secondary | ICD-10-CM | POA: Diagnosis not present

## 2023-07-13 DIAGNOSIS — K648 Other hemorrhoids: Secondary | ICD-10-CM | POA: Diagnosis not present

## 2023-07-13 DIAGNOSIS — E669 Obesity, unspecified: Secondary | ICD-10-CM | POA: Diagnosis not present

## 2023-07-13 DIAGNOSIS — Z800 Family history of malignant neoplasm of digestive organs: Secondary | ICD-10-CM | POA: Insufficient documentation

## 2023-07-13 DIAGNOSIS — K5730 Diverticulosis of large intestine without perforation or abscess without bleeding: Secondary | ICD-10-CM

## 2023-07-13 DIAGNOSIS — K219 Gastro-esophageal reflux disease without esophagitis: Secondary | ICD-10-CM | POA: Insufficient documentation

## 2023-07-13 DIAGNOSIS — Z980 Intestinal bypass and anastomosis status: Secondary | ICD-10-CM | POA: Diagnosis not present

## 2023-07-13 DIAGNOSIS — I10 Essential (primary) hypertension: Secondary | ICD-10-CM | POA: Diagnosis not present

## 2023-07-13 DIAGNOSIS — K644 Residual hemorrhoidal skin tags: Secondary | ICD-10-CM

## 2023-07-13 DIAGNOSIS — Z6841 Body Mass Index (BMI) 40.0 and over, adult: Secondary | ICD-10-CM | POA: Insufficient documentation

## 2023-07-13 DIAGNOSIS — E119 Type 2 diabetes mellitus without complications: Secondary | ICD-10-CM | POA: Diagnosis not present

## 2023-07-13 DIAGNOSIS — D125 Benign neoplasm of sigmoid colon: Secondary | ICD-10-CM | POA: Diagnosis not present

## 2023-07-13 DIAGNOSIS — Z9049 Acquired absence of other specified parts of digestive tract: Secondary | ICD-10-CM | POA: Diagnosis not present

## 2023-07-13 DIAGNOSIS — Z139 Encounter for screening, unspecified: Secondary | ICD-10-CM | POA: Diagnosis not present

## 2023-07-13 DIAGNOSIS — D122 Benign neoplasm of ascending colon: Secondary | ICD-10-CM

## 2023-07-13 DIAGNOSIS — K6389 Other specified diseases of intestine: Secondary | ICD-10-CM | POA: Diagnosis not present

## 2023-07-13 HISTORY — PX: COLONOSCOPY WITH PROPOFOL: SHX5780

## 2023-07-13 HISTORY — PX: POLYPECTOMY: SHX5525

## 2023-07-13 LAB — HM COLONOSCOPY

## 2023-07-13 LAB — GLUCOSE, CAPILLARY: Glucose-Capillary: 159 mg/dL — ABNORMAL HIGH (ref 70–99)

## 2023-07-13 SURGERY — COLONOSCOPY WITH PROPOFOL
Anesthesia: General

## 2023-07-13 MED ORDER — PROPOFOL 500 MG/50ML IV EMUL
INTRAVENOUS | Status: DC | PRN
  Administered 2023-07-13: 150 ug/kg/min via INTRAVENOUS

## 2023-07-13 MED ORDER — PROPOFOL 10 MG/ML IV BOLUS: INTRAVENOUS | Status: DC | PRN

## 2023-07-13 MED ORDER — PROPOFOL 500 MG/50ML IV EMUL: INTRAVENOUS | Status: DC | PRN

## 2023-07-13 MED ORDER — PROPOFOL 10 MG/ML IV BOLUS
INTRAVENOUS | Status: DC | PRN
  Administered 2023-07-13: 50 mg via INTRAVENOUS
  Administered 2023-07-13: 20 mg via INTRAVENOUS

## 2023-07-13 MED ORDER — LACTATED RINGERS IV SOLN: INTRAVENOUS | Status: DC

## 2023-07-13 NOTE — Anesthesia Preprocedure Evaluation (Signed)
Anesthesia Evaluation  Patient identified by MRN, date of birth, ID band Patient awake    Reviewed: Allergy & Precautions, H&P , NPO status , Patient's Chart, lab work & pertinent test results, reviewed documented beta blocker date and time   History of Anesthesia Complications (+) PONV and history of anesthetic complications  Airway Mallampati: II  TM Distance: >3 FB Neck ROM: full    Dental no notable dental hx.    Pulmonary neg pulmonary ROS, asthma , sleep apnea , pneumonia   Pulmonary exam normal breath sounds clear to auscultation       Cardiovascular Exercise Tolerance: Good hypertension, negative cardio ROS  Rhythm:regular Rate:Normal     Neuro/Psych  PSYCHIATRIC DISORDERS Anxiety Depression     Neuromuscular disease negative neurological ROS  negative psych ROS   GI/Hepatic negative GI ROS, Neg liver ROS,GERD  ,,  Endo/Other  diabetesHypothyroidism  Morbid obesity  Renal/GU Renal diseasenegative Renal ROS  negative genitourinary   Musculoskeletal   Abdominal   Peds  Hematology negative hematology ROS (+)   Anesthesia Other Findings   Reproductive/Obstetrics negative OB ROS                             Anesthesia Physical Anesthesia Plan  ASA: 3  Anesthesia Plan: General   Post-op Pain Management:    Induction:   PONV Risk Score and Plan: Propofol infusion  Airway Management Planned:   Additional Equipment:   Intra-op Plan:   Post-operative Plan:   Informed Consent: I have reviewed the patients History and Physical, chart, labs and discussed the procedure including the risks, benefits and alternatives for the proposed anesthesia with the patient or authorized representative who has indicated his/her understanding and acceptance.     Dental Advisory Given  Plan Discussed with: CRNA  Anesthesia Plan Comments:        Anesthesia Quick Evaluation

## 2023-07-13 NOTE — Transfer of Care (Signed)
Immediate Anesthesia Transfer of Care Note  Patient: Kristin Bailey  Procedure(s) Performed: COLONOSCOPY WITH PROPOFOL POLYPECTOMY  Patient Location: PACU  Anesthesia Type:General  Level of Consciousness: awake, alert , oriented, and patient cooperative  Airway & Oxygen Therapy: Patient Spontanous Breathing  Post-op Assessment: Report given to RN, Post -op Vital signs reviewed and stable, and Patient moving all extremities X 4  Post vital signs: Reviewed and stable  Last Vitals:  Vitals Value Taken Time  BP 105/57 07/13/23 1103  Temp 36.6 C 07/13/23 1103  Pulse 86 07/13/23 1103  Resp 21 07/13/23 1103  SpO2 97 % 07/13/23 1103    Last Pain:  Vitals:   07/13/23 1103  TempSrc: Oral  PainSc: 0-No pain         Complications: No notable events documented.

## 2023-07-13 NOTE — Interval H&P Note (Signed)
History and Physical Interval Note:  07/13/2023 10:15 AM  Kristin Bailey  has presented today for surgery, with the diagnosis of screening.  The various methods of treatment have been discussed with the patient and family. After consideration of risks, benefits and other options for treatment, the patient has consented to  Procedure(s) with comments: COLONOSCOPY WITH PROPOFOL (N/A) - 11:30am;asa 3 as a surgical intervention.  The patient's history has been reviewed, patient examined, no change in status, stable for surgery.  I have reviewed the patient's chart and labs.  Questions were answered to the patient's satisfaction.     Juanetta Beets Deana Krock

## 2023-07-13 NOTE — Op Note (Signed)
[facility] Patient Name: Kristin Bailey Procedure Date: 07/13/2023 9:13 AM MRN: [id number] Date of Birth: 18-Aug-1961 Attending MD: Sanjuan Dame , MD, [id number] CSN: [id number] Age: 62 Admit Type: Outpatient Procedure:                Colonoscopy Indications:              Screening for colorectal malignant neoplasm Providers:                Sanjuan Dame, MD, Angelica Ran, Dyann Ruddle,                            Elinor Parkinson Referring MD:              Medicines:                Monitored Anesthesia Care Complications:            No immediate complications. Estimated Blood Loss:     Estimated blood loss: none. Procedure:                After obtaining informed consent, the colonoscope                            was passed under direct vision. Throughout the                            procedure, the patient's blood pressure, pulse, and                            oxygen saturations were monitored continuously. The                            [id number]) scope was introduced through the                            anus and advanced to the the cecum, identified by                            appendiceal orifice and ileocecal valve. The                            colonoscopy was performed without difficulty. The                            patient tolerated the procedure well. The quality                            of the bowel preparation was evaluated using the                            BBPS [facility] Bowel Preparation Scale) with scores                            of: Right Colon = 3, Transverse Colon = 3 and Left  Colon = 3 (entire mucosa seen well with no residual                            staining, small fragments of stool or opaque                            liquid). The total BBPS score equals 9. The                            ileocecal valve, appendiceal orifice, and rectum                            were photographed. Scope In: 10:35:32  AM Scope Out: 10:58:36 AM Scope Withdrawal Time: 0 hours 20 minutes 46 seconds  Total Procedure Duration: 0 hours 23 minutes 4 seconds  Findings:      A 6 mm polyp was found in the sigmoid colon. The polyp was sessile. The       polyp was removed with a cold snare. Resection and retrieval were       complete.      Scattered diverticula were found in the left colon.      Non-bleeding external and internal hemorrhoids were found during       retroflexion. Impression:               - One 6 mm polyp in the sigmoid colon, removed with                            a cold snare. Resected and retrieved.                           - Diverticulosis in the left colon.                           - Non-bleeding external and internal hemorrhoids.                           -Previous colon resection anastomosis seen due to                            diveticulitis, appears healthy Moderate Sedation:      Per Anesthesia Care Recommendation:           - Patient has a contact number available for                            emergencies. The signs and symptoms of potential                            delayed complications were discussed with the                            patient. Return to normal activities tomorrow.                            Written discharge instructions were provided to the  patient.                           - Resume previous diet.                           - Continue present medications.                           - Await pathology results.                           - Repeat colonoscopy for surveillance based on                            pathology results.                           - Return to primary care physician as previously                            scheduled. Procedure Code(s):        --- Professional ---                           [phone], Colonoscopy, flexible; with removal of                            tumor(s), polyp(s), or other lesion(s) by snare                             technique Diagnosis Code(s):        --- Professional ---                           Z12.11, Encounter for screening for malignant                            neoplasm of colon                           D12.5, Benign neoplasm of sigmoid colon                           K57.30, Diverticulosis of large intestine without                            perforation or abscess without bleeding CPT copyright 2022 American Medical Association. All rights reserved. The codes documented in this report are preliminary and upon coder review may  be revised to meet current compliance requirements. Sanjuan Dame, MD Sanjuan Dame, MD 07/13/2023 11:05:29 AM This report has been signed electronically. Number of Addenda: 0

## 2023-07-13 NOTE — Discharge Instructions (Signed)

## 2023-07-14 NOTE — Anesthesia Postprocedure Evaluation (Signed)
Anesthesia Post Note  Patient: Kristin Bailey  Procedure(s) Performed: COLONOSCOPY WITH PROPOFOL POLYPECTOMY  Patient location during evaluation: Phase II Anesthesia Type: General Level of consciousness: awake Pain management: pain level controlled Vital Signs Assessment: post-procedure vital signs reviewed and stable Respiratory status: spontaneous breathing and respiratory function stable Cardiovascular status: blood pressure returned to baseline and stable Postop Assessment: no headache and no apparent nausea or vomiting Anesthetic complications: no Comments: Late entry   No notable events documented.   Last Vitals:  Vitals:   07/13/23 1015 07/13/23 1103  BP: (!) 141/82 (!) 105/57  Pulse: 85 86  Resp: 20 (!) 21  Temp: 37 C 36.6 C  SpO2: 98% 97%    Last Pain:  Vitals:   07/13/23 1103  TempSrc: Oral  PainSc: 0-No pain                 Windell Norfolk

## 2023-07-18 ENCOUNTER — Encounter (INDEPENDENT_AMBULATORY_CARE_PROVIDER_SITE_OTHER): Payer: Self-pay | Admitting: *Deleted

## 2023-07-19 ENCOUNTER — Encounter (HOSPITAL_COMMUNITY): Payer: Self-pay | Admitting: Gastroenterology

## 2023-07-20 LAB — SURGICAL PATHOLOGY

## 2023-07-24 ENCOUNTER — Encounter (INDEPENDENT_AMBULATORY_CARE_PROVIDER_SITE_OTHER): Payer: Self-pay | Admitting: *Deleted

## 2023-07-24 NOTE — Progress Notes (Signed)
I reviewed the pathology results. Ann, can you send her a letter with the findings as described below please? Repeat colonoscopy in 5 years  Thanks,  Vista Lawman, MD Gastroenterology and Hepatology [facility]  ---------------------------------------------------------------------------------------------  [facility] [address], [address], [place], [place] [address] Phone:  [phone]   07/24/23 Kristin Bailey, [place]   Dear [name],  I am writing to inform you that the biopsies taken during your recent endoscopic examination showed:   Hyperplastic Polyp I am writing to let you know the results of your recent colonoscopy.  You had a total of 1 polyps removed. The pathology came back as "hyperplastic polyp." These findings are NOT cancer and do not turn into cancer. Given these findings, it is recommended that your next colonoscopy be performed in 5 years, given family history of colon cancer   Please call us at [phone] if you have persistent problems or have questions about your condition that have not been fully answered at this time.  Sincerely,  Vista Lawman, MD Gastroenterology and Hepatology

## 2023-08-03 DIAGNOSIS — E039 Hypothyroidism, unspecified: Secondary | ICD-10-CM | POA: Diagnosis not present

## 2023-08-03 DIAGNOSIS — E782 Mixed hyperlipidemia: Secondary | ICD-10-CM | POA: Diagnosis not present

## 2023-08-03 DIAGNOSIS — E1165 Type 2 diabetes mellitus with hyperglycemia: Secondary | ICD-10-CM | POA: Diagnosis not present

## 2023-08-08 DIAGNOSIS — E1165 Type 2 diabetes mellitus with hyperglycemia: Secondary | ICD-10-CM | POA: Diagnosis not present

## 2023-08-08 DIAGNOSIS — G4700 Insomnia, unspecified: Secondary | ICD-10-CM | POA: Diagnosis not present

## 2023-08-08 DIAGNOSIS — I10 Essential (primary) hypertension: Secondary | ICD-10-CM | POA: Diagnosis not present

## 2023-08-08 DIAGNOSIS — E039 Hypothyroidism, unspecified: Secondary | ICD-10-CM | POA: Diagnosis not present

## 2023-08-08 DIAGNOSIS — E782 Mixed hyperlipidemia: Secondary | ICD-10-CM | POA: Diagnosis not present

## 2023-08-08 DIAGNOSIS — J4520 Mild intermittent asthma, uncomplicated: Secondary | ICD-10-CM | POA: Diagnosis not present

## 2023-08-08 DIAGNOSIS — F419 Anxiety disorder, unspecified: Secondary | ICD-10-CM | POA: Diagnosis not present

## 2023-08-08 DIAGNOSIS — K5730 Diverticulosis of large intestine without perforation or abscess without bleeding: Secondary | ICD-10-CM | POA: Diagnosis not present

## 2023-09-05 DIAGNOSIS — Z23 Encounter for immunization: Secondary | ICD-10-CM | POA: Diagnosis not present

## 2023-11-22 DIAGNOSIS — M545 Low back pain, unspecified: Secondary | ICD-10-CM | POA: Diagnosis not present

## 2023-11-22 DIAGNOSIS — R059 Cough, unspecified: Secondary | ICD-10-CM | POA: Diagnosis not present

## 2023-11-22 DIAGNOSIS — G8929 Other chronic pain: Secondary | ICD-10-CM | POA: Diagnosis not present

## 2023-11-22 DIAGNOSIS — J4521 Mild intermittent asthma with (acute) exacerbation: Secondary | ICD-10-CM | POA: Diagnosis not present

## 2023-11-22 DIAGNOSIS — J019 Acute sinusitis, unspecified: Secondary | ICD-10-CM | POA: Diagnosis not present

## 2023-11-27 ENCOUNTER — Other Ambulatory Visit (HOSPITAL_COMMUNITY): Payer: Self-pay | Admitting: Nurse Practitioner

## 2023-11-27 ENCOUNTER — Ambulatory Visit (HOSPITAL_COMMUNITY)
Admission: RE | Admit: 2023-11-27 | Discharge: 2023-11-27 | Disposition: A | Discharge status: Home / Self Care | Payer: Medicare Other | Source: Ambulatory Visit | Attending: Nurse Practitioner | Admitting: Nurse Practitioner

## 2023-11-27 DIAGNOSIS — J019 Acute sinusitis, unspecified: Secondary | ICD-10-CM | POA: Diagnosis not present

## 2023-11-27 DIAGNOSIS — J4521 Mild intermittent asthma with (acute) exacerbation: Secondary | ICD-10-CM | POA: Insufficient documentation

## 2023-11-27 DIAGNOSIS — J45901 Unspecified asthma with (acute) exacerbation: Secondary | ICD-10-CM | POA: Diagnosis not present

## 2023-11-27 DIAGNOSIS — R059 Cough, unspecified: Secondary | ICD-10-CM | POA: Diagnosis not present

## 2023-12-06 DIAGNOSIS — E1165 Type 2 diabetes mellitus with hyperglycemia: Secondary | ICD-10-CM | POA: Diagnosis not present

## 2023-12-06 DIAGNOSIS — E782 Mixed hyperlipidemia: Secondary | ICD-10-CM | POA: Diagnosis not present

## 2023-12-06 DIAGNOSIS — D3131 Benign neoplasm of right choroid: Secondary | ICD-10-CM | POA: Diagnosis not present

## 2023-12-06 DIAGNOSIS — E039 Hypothyroidism, unspecified: Secondary | ICD-10-CM | POA: Diagnosis not present

## 2023-12-06 DIAGNOSIS — Z1330 Encounter for screening examination for mental health and behavioral disorders, unspecified: Secondary | ICD-10-CM | POA: Diagnosis not present

## 2023-12-06 DIAGNOSIS — J069 Acute upper respiratory infection, unspecified: Secondary | ICD-10-CM | POA: Diagnosis not present

## 2023-12-06 DIAGNOSIS — Z1231 Encounter for screening mammogram for malignant neoplasm of breast: Secondary | ICD-10-CM | POA: Diagnosis not present

## 2023-12-06 DIAGNOSIS — E1143 Type 2 diabetes mellitus with diabetic autonomic (poly)neuropathy: Secondary | ICD-10-CM | POA: Diagnosis not present

## 2023-12-07 ENCOUNTER — Ambulatory Visit (HOSPITAL_COMMUNITY): Payer: Medicare Other

## 2023-12-14 ENCOUNTER — Ambulatory Visit (HOSPITAL_COMMUNITY): Payer: Medicare Other

## 2023-12-21 DIAGNOSIS — R92323 Mammographic fibroglandular density, bilateral breasts: Secondary | ICD-10-CM | POA: Diagnosis not present

## 2023-12-21 DIAGNOSIS — Z1231 Encounter for screening mammogram for malignant neoplasm of breast: Secondary | ICD-10-CM | POA: Diagnosis not present

## 2024-01-10 DIAGNOSIS — D3131 Benign neoplasm of right choroid: Secondary | ICD-10-CM | POA: Diagnosis not present

## 2024-01-20 ENCOUNTER — Emergency Department (HOSPITAL_COMMUNITY)

## 2024-01-20 ENCOUNTER — Encounter (HOSPITAL_COMMUNITY): Payer: Self-pay

## 2024-01-20 ENCOUNTER — Other Ambulatory Visit: Payer: Self-pay

## 2024-01-20 ENCOUNTER — Emergency Department (HOSPITAL_COMMUNITY)
Admission: EM | Admit: 2024-01-20 | Discharge: 2024-01-20 | Disposition: A | Discharge status: Home / Self Care | Attending: Emergency Medicine | Admitting: Emergency Medicine

## 2024-01-20 DIAGNOSIS — Z7982 Long term (current) use of aspirin: Secondary | ICD-10-CM | POA: Diagnosis not present

## 2024-01-20 DIAGNOSIS — R109 Unspecified abdominal pain: Secondary | ICD-10-CM | POA: Diagnosis not present

## 2024-01-20 DIAGNOSIS — R000 Tachycardia, unspecified: Secondary | ICD-10-CM | POA: Insufficient documentation

## 2024-01-20 DIAGNOSIS — E1165 Type 2 diabetes mellitus with hyperglycemia: Secondary | ICD-10-CM | POA: Diagnosis not present

## 2024-01-20 DIAGNOSIS — Z794 Long term (current) use of insulin: Secondary | ICD-10-CM | POA: Diagnosis not present

## 2024-01-20 DIAGNOSIS — R1032 Left lower quadrant pain: Secondary | ICD-10-CM | POA: Diagnosis not present

## 2024-01-20 DIAGNOSIS — N200 Calculus of kidney: Secondary | ICD-10-CM | POA: Diagnosis not present

## 2024-01-20 DIAGNOSIS — Z7984 Long term (current) use of oral hypoglycemic drugs: Secondary | ICD-10-CM | POA: Diagnosis not present

## 2024-01-20 DIAGNOSIS — R197 Diarrhea, unspecified: Secondary | ICD-10-CM | POA: Insufficient documentation

## 2024-01-20 DIAGNOSIS — K5730 Diverticulosis of large intestine without perforation or abscess without bleeding: Secondary | ICD-10-CM | POA: Diagnosis not present

## 2024-01-20 DIAGNOSIS — D72829 Elevated white blood cell count, unspecified: Secondary | ICD-10-CM | POA: Diagnosis not present

## 2024-01-20 LAB — COMPREHENSIVE METABOLIC PANEL
ALT: 22 U/L (ref 0–44)
AST: 22 U/L (ref 15–41)
Albumin: 4.7 g/dL (ref 3.5–5.0)
Alkaline Phosphatase: 80 U/L (ref 38–126)
Anion gap: 13 (ref 5–15)
BUN: 20 mg/dL (ref 8–23)
CO2: 21 mmol/L — ABNORMAL LOW (ref 22–32)
Calcium: 9.9 mg/dL (ref 8.9–10.3)
Chloride: 102 mmol/L (ref 98–111)
Creatinine, Ser: 0.74 mg/dL (ref 0.44–1.00)
GFR, Estimated: >60 mL/min (ref >60)
Glucose, Bld: 176 mg/dL — ABNORMAL HIGH (ref 70–99)
Potassium: 3.9 mmol/L (ref 3.5–5.1)
Sodium: 136 mmol/L (ref 135–145)
Total Bilirubin: 0.5 mg/dL (ref 0.0–1.2)
Total Protein: 8.8 g/dL — ABNORMAL HIGH (ref 6.5–8.1)

## 2024-01-20 LAB — LIPASE, BLOOD: Lipase: 41 U/L (ref 11–51)

## 2024-01-20 LAB — URINALYSIS, ROUTINE W REFLEX MICROSCOPIC
Bilirubin Urine: NEGATIVE
Glucose, UA: NEGATIVE mg/dL
Ketones, ur: NEGATIVE mg/dL
Leukocytes,Ua: NEGATIVE
Nitrite: NEGATIVE
Protein, ur: NEGATIVE mg/dL
Specific Gravity, Urine: 1.011 (ref 1.005–1.030)
pH: 5 (ref 5.0–8.0)

## 2024-01-20 LAB — CBC
HCT: 49 % — ABNORMAL HIGH (ref 36.0–46.0)
Hemoglobin: 15.8 g/dL — ABNORMAL HIGH (ref 12.0–15.0)
MCH: 27.3 pg (ref 26.0–34.0)
MCHC: 32.2 g/dL (ref 30.0–36.0)
MCV: 84.6 fL (ref 80.0–100.0)
Platelets: 412 10*3/uL — ABNORMAL HIGH (ref 150–400)
RBC: 5.79 MIL/uL — ABNORMAL HIGH (ref 3.87–5.11)
RDW: 14.8 % (ref 11.5–15.5)
WBC: 20.2 10*3/uL — ABNORMAL HIGH (ref 4.0–10.5)
nRBC: 0 % (ref 0.0–0.2)

## 2024-01-20 MED ORDER — ONDANSETRON HCL 4 MG PO TABS: 4.0000 mg | ORAL_TABLET | Freq: Four times a day (QID) | ORAL | 0 refills | Status: AC

## 2024-01-20 MED ORDER — AMOXICILLIN-POT CLAVULANATE 875-125 MG PO TABS
1.0000 | ORAL_TABLET | Freq: Once | ORAL | Status: AC
  Administered 2024-01-20: 1 via ORAL
  Filled 2024-01-20: qty 1

## 2024-01-20 MED ORDER — IOHEXOL 300 MG/ML  SOLN
100.0000 mL | Freq: Once | INTRAMUSCULAR | Status: AC | PRN
  Administered 2024-01-20: 100 mL via INTRAVENOUS

## 2024-01-20 MED ORDER — AMOXICILLIN-POT CLAVULANATE 875-125 MG PO TABS
1.0000 | ORAL_TABLET | Freq: Two times a day (BID) | ORAL | 0 refills | Status: DC
Start: 2024-01-20 — End: 2024-03-18

## 2024-01-20 MED ORDER — HYDROMORPHONE HCL 1 MG/ML IJ SOLN
1.0000 mg | Freq: Once | INTRAMUSCULAR | Status: AC
  Administered 2024-01-20: 1 mg via INTRAVENOUS
  Filled 2024-01-20: qty 1

## 2024-01-20 MED ORDER — ONDANSETRON HCL 4 MG/2ML IJ SOLN
4.0000 mg | Freq: Once | INTRAMUSCULAR | Status: AC
  Administered 2024-01-20: 4 mg via INTRAVENOUS
  Filled 2024-01-20: qty 2

## 2024-01-20 NOTE — ED Notes (Signed)
 Patient transported to CT

## 2024-01-20 NOTE — ED Triage Notes (Signed)
 Pt with hx of diverticulitis having diarrhea since yesterday. Pt also complaining of "stinky burps" that smell like sulfa. Nausea but no vomiting. Pt is wearing a diaper due to current inability to hold it in.

## 2024-01-20 NOTE — Discharge Instructions (Addendum)
 Please make sure that you are drinking plenty of clear liquids, Zofran for nausea, Augmentin twice a day to treat for possible colitis.  ER for severe worsening symptoms  You can see your doctor within 2 or 3 days in the office as long as you are doing better but if you are getting worse with more pain vomiting fevers or severe diarrhea you can return to the ER for repeat evaluation

## 2024-01-20 NOTE — ED Provider Notes (Signed)
 [place] EMERGENCY DEPARTMENT AT [facility] Provider Note   CSN: [id number] Arrival date & time: 01/20/24  1951     History  Chief Complaint  Patient presents with   Diarrhea    Kristin Bailey is a 63 y.o. female.   Diarrhea    This is a 63 year old female with a history of diabetes on glipizide and Ozempic, she has a history of diverticulitis with multiple flareups in the past ultimately requiring a section of her sigmoid colon to be surgically removed about 5 years ago.  Since that time the patient has done very well and has not had any recurrent diverticulitis, last night she started to have some diarrhea, this morning it became worse, she has had associated severe nausea and is burping gas that smells like rotten eggs.  She has increased abdominal discomfort but no fevers or chills.  She is feeling lightheaded, she has had very little to eat or drink today because of this.  No urinary symptoms.  Diarrhea is watery, every 15 to 20 minutes.  She is feeling severely fatigued associated with this  Home Medications Prior to Admission medications   Medication Sig Start Date End Date Taking? Authorizing Provider  amoxicillin-clavulanate (AUGMENTIN) 875-125 MG tablet Take 1 tablet by mouth every 12 (twelve) hours. 01/20/24  Yes Eber Hong, MD  ondansetron (ZOFRAN) 4 MG tablet Take 1 tablet (4 mg total) by mouth every 6 (six) hours. 01/20/24  Yes Eber Hong, MD  acetaminophen (TYLENOL) 650 MG CR tablet Take 1,300 mg by mouth. Takes 2 every morning    [provider]  ALPRAZolam (XANAX) 0.5 MG tablet Take 1 mg by mouth at bedtime. 02/03/21   [provider]  aspirin EC 81 MG tablet Take 81 mg by mouth daily. Swallow whole.    [provider]  buPROPion (WELLBUTRIN XL) 150 MG 24 hr tablet Take 150 mg by mouth daily.    [provider]  cetirizine (ZYRTEC) 10 MG tablet Take 10 mg by mouth daily.    [provider]  Cholecalciferol  (VITAMIN D) 125 MCG (5000 UT) CAPS Take 5,000 Units by mouth daily.    [provider]  docusate sodium (COLACE) 100 MG capsule Take 1 capsule (100 mg total) by mouth 2 (two) times daily. Patient taking differently: Take 300 mg by mouth at bedtime. 04/26/19   Lucretia Roers, MD  DULoxetine (CYMBALTA) 60 MG capsule Take 1 capsule (60 mg total) by mouth 2 (two) times daily. 03/13/19   Neysa Hotter, MD  gabapentin (NEURONTIN) 300 MG capsule Take 300 mg by mouth at bedtime. 01/17/21   [provider]  glipiZIDE (GLUCOTROL XL) 10 MG 24 hr tablet Take 10 mg by mouth daily. 12/09/20   [provider]  levothyroxine (SYNTHROID, LEVOTHROID) 100 MCG tablet Take 100 mcg by mouth daily before breakfast.  08/16/17   [provider]  lisinopril-hydrochlorothiazide (ZESTORETIC) 20-12.5 MG tablet Take 0.5 tablets by mouth daily.    [provider]  Multiple Vitamin (MULTIVITAMIN WITH MINERALS) TABS tablet Take 1 tablet by mouth daily. Woman's 50+    [provider]  naphazoline-pheniramine (ALLERGY EYE) 0.025-0.3 % ophthalmic solution Place 1 drop into both eyes 4 (four) times daily as needed for eye irritation or allergies.    [provider]  omeprazole (PRILOSEC OTC) 20 MG tablet Take 1 tablet (20 mg total) by mouth daily. Patient taking differently: Take 20 mg by mouth See admin instructions. Every other night 01/03/18  Rehman, Joline Maxcy, MD  rosuvastatin (CRESTOR) 10 MG tablet Take 10 mg by mouth daily.    [provider]  Semaglutide (OZEMPIC, 0.25 OR 0.5 MG/DOSE, [place]) Inject 0.5 mg into the skin once a week.    [provider]  valACYclovir (VALTREX) 1000 MG tablet Take 2,000 mg by mouth See admin instructions. As needed for fever blisters, take 2 tablets twice a day for two doses.    [provider]      Allergies    Patient has no known allergies.    Review of Systems   Review of Systems  Gastrointestinal:  Positive  for diarrhea.  All other systems reviewed and are negative.   Physical Exam Updated Vital Signs BP 115/69 (BP Location: Right Arm)   Pulse (!) 105   Temp 98.8 F (37.1 C) (Oral)   Resp 16   Ht 1.651 m (5\' 5" )   Wt 108.9 kg   LMP 03/27/2014 Comment: irregular  SpO2 97%   BMI 39.94 kg/m  Physical Exam Vitals and nursing note reviewed.  Constitutional:      General: She is not in acute distress.    Appearance: She is well-developed.  HENT:     Head: Normocephalic and atraumatic.     Mouth/Throat:     Pharynx: No oropharyngeal exudate.  Eyes:     General: No scleral icterus.       Right eye: No discharge.        Left eye: No discharge.     Conjunctiva/sclera: Conjunctivae normal.     Pupils: Pupils are equal, round, and reactive to light.  Neck:     Thyroid: No thyromegaly.     Vascular: No JVD.  Cardiovascular:     Rate and Rhythm: Regular rhythm. Tachycardia present.     Heart sounds: Normal heart sounds. No murmur heard.    No friction rub. No gallop.  Pulmonary:     Effort: Pulmonary effort is normal. No respiratory distress.     Breath sounds: Normal breath sounds. No wheezing or rales.  Abdominal:     General: Bowel sounds are normal. There is no distension.     Palpations: Abdomen is soft. There is no mass.     Tenderness: There is abdominal tenderness.  Musculoskeletal:        General: No tenderness. Normal range of motion.     Cervical back: Normal range of motion and neck supple.     Right lower leg: No edema.     Left lower leg: No edema.  Lymphadenopathy:     Cervical: No cervical adenopathy.  Skin:    General: Skin is warm and dry.     Findings: No erythema or rash.  Neurological:     Mental Status: She is alert.     Coordination: Coordination normal.  Psychiatric:        Behavior: Behavior normal.     ED Results / Procedures / Treatments   Labs (all labs ordered are listed, but only abnormal results are displayed) Labs Reviewed   COMPREHENSIVE METABOLIC PANEL - Abnormal; Notable for the following components:      Result Value   CO2 21 (*)    Glucose, Bld 176 (*)    Total Protein 8.8 (*)    All other components within normal limits  CBC - Abnormal; Notable for the following components:   WBC 20.2 (*)    RBC 5.79 (*)    Hemoglobin 15.8 (*)    HCT  49.0 (*)    Platelets 412 (*)    All other components within normal limits  URINALYSIS, ROUTINE W REFLEX MICROSCOPIC - Abnormal; Notable for the following components:   Hgb urine dipstick SMALL (*)    Bacteria, UA RARE (*)    All other components within normal limits  LIPASE, BLOOD    EKG None  Radiology CT ABDOMEN PELVIS W CONTRAST Result Date: 01/20/2024 CLINICAL DATA:  LLQ abdominal pain Prior diverticulitis status post surgery, rule out obstruction Diarrhea. EXAM: CT ABDOMEN AND PELVIS WITH CONTRAST TECHNIQUE: Multidetector CT imaging of the abdomen and pelvis was performed using the standard protocol following bolus administration of intravenous contrast. RADIATION DOSE REDUCTION: This exam was performed according to the departmental dose-optimization program which includes automated exposure control, adjustment of the mA and/or kV according to patient size and/or use of iterative reconstruction technique. CONTRAST:  OMNIPAQUE IOHEXOL 300 MG/ML  SOLN COMPARISON:  CT 05/18/2022 FINDINGS: Lower chest: Motion artifact, allowing for this no acute findings. Hepatobiliary: No focal liver abnormality is seen. The liver is prominent in size, 17.1 cm cranial caudal. Status post cholecystectomy. No biliary dilatation. Pancreas: No ductal dilatation or inflammation. Spleen: Normal in size without focal abnormality. Adrenals/Urinary Tract: No adrenal nodule. No hydronephrosis or suspicious renal lesion. Punctate nonobstructing left lower pole calculus. Decompressed urinary bladder, not well assessed. Stomach/Bowel: The stomach is moderately distended with air in fluid. No wall  thickening or inflammation. Decompressed proximal small bowel. There are few fluid-filled small bowel loops in the distal ileum with fecalization of small bowel contents. Fluid/liquid stool within the ascending, transverse, proximal descending colon. Descending colon is decompressed. Small amount of fluid in the sigmoid. Sigmoid in tear exude years. Scattered colonic diverticula without focal diverticulitis. There is no discrete colonic wall thickening or inflammatory change. Normal appendix visualized. Vascular/Lymphatic: Normal caliber abdominal aorta, minimal atherosclerosis. Patent portal, splenic, and mesenteric veins. No abdominopelvic adenopathy. Reproductive: Uterus and bilateral adnexa are unremarkable. Other: No free air, free fluid, or intra-abdominal fluid collection. Musculoskeletal: Lower lumbar degenerative change. There are no acute or suspicious osseous abnormalities. IMPRESSION: 1. Fluid/liquid stool throughout the colon, consistent with diarrheal illness. 2. Air and fluid distension with stomach. Scattered fluid-filled loops of small bowel. No small bowel distension or evidence of obstruction. 3. Scattered colonic diverticula without focal diverticulitis. 4. Punctate nonobstructing left renal calculus. Electronically Signed   By: Narda Rutherford M.D.   On: 01/20/2024 21:18    Procedures Procedures    Medications Ordered in ED Medications  amoxicillin-clavulanate (AUGMENTIN) 875-125 MG per tablet 1 tablet (has no administration in time range)  ondansetron (ZOFRAN) injection 4 mg (4 mg Intravenous Given 01/20/24 2032)  HYDROmorphone (DILAUDID) injection 1 mg (1 mg Intravenous Given 01/20/24 2032)  iohexol (OMNIPAQUE) 300 MG/ML solution 100 mL (100 mLs Intravenous Contrast Given 01/20/24 2059)    ED Course/ Medical Decision Making/ A&P                                 Medical Decision Making Amount and/or Complexity of Data Reviewed Labs: ordered. Radiology:  ordered.  Risk Prescription drug management.    This patient presents to the ED for concern of abdominal pain vomiting and nausea with some diarrhea, this involves an extensive number of treatment options, and is a complaint that carries with it a high risk of complications and morbidity.  The differential diagnosis includes colitis, bowel obstruction, diverticulitis, gastroenteritis, viral syndrome, food  poisoning.  Spouse at the bedside has not had any of the symptoms   Co morbidities that complicate the patient evaluation  Obesity, diabetes, prior abdominal surgery   Additional history obtained:  Additional history obtained from medical record External records from outside source obtained and reviewed including prior surgical notes and admissions   Lab Tests:  I Ordered, and personally interpreted labs.  The pertinent results include: Leukocytosis of 20,000, otherwise labs unremarkable, lipase is normal, metabolic panel with hyperglycemia but no renal dysfunction, no anemia   Imaging Studies ordered:  I ordered imaging studies including CT scan of the abdomen and pelvis I independently visualized and interpreted imaging which showed diarrheal illness without any evidence of bowel obstruction or other surgical process I agree with the radiologist interpretation   Cardiac Monitoring: / EKG:  The patient was maintained on a cardiac monitor.  I personally viewed and interpreted the cardiac monitored which showed an underlying rhythm of: Normal sinus rhythm, heart rate is reduced down to about 95 at the time of discharge   Problem List / ED Course / Critical interventions / Medication management  The patient has been given fluids, Augmentin, Dilaudid, Zofran, improved significantly and no longer nauseated or vomiting.  Abdominal pain is improved.  She was informed of her results, stable for discharge.  It is unclear what is causing the diarrhea whether this is a case of food  poisoning viral illness or a bacterial cause but given the large leukocytosis and antibiotic was added  Social Determinants of Health:  None   Test / Admission - Considered:  Considered admission but the patient improved significantly and has a nonsurgical abdomen         Final Clinical Impression(s) / ED Diagnoses Final diagnoses:  Diarrhea of presumed infectious origin    Rx / DC Orders ED Discharge Orders          Ordered    amoxicillin-clavulanate (AUGMENTIN) 875-125 MG tablet  Every 12 hours        01/20/24 2208    ondansetron (ZOFRAN) 4 MG tablet  Every 6 hours        01/20/24 2208              Eber Hong, MD 01/20/24 2210

## 2024-01-30 DIAGNOSIS — Z1330 Encounter for screening examination for mental health and behavioral disorders, unspecified: Secondary | ICD-10-CM | POA: Diagnosis not present

## 2024-01-30 DIAGNOSIS — R112 Nausea with vomiting, unspecified: Secondary | ICD-10-CM | POA: Diagnosis not present

## 2024-01-30 DIAGNOSIS — K529 Noninfective gastroenteritis and colitis, unspecified: Secondary | ICD-10-CM | POA: Diagnosis not present

## 2024-01-30 DIAGNOSIS — F332 Major depressive disorder, recurrent severe without psychotic features: Secondary | ICD-10-CM | POA: Diagnosis not present

## 2024-01-30 DIAGNOSIS — F902 Attention-deficit hyperactivity disorder, combined type: Secondary | ICD-10-CM | POA: Diagnosis not present

## 2024-03-18 ENCOUNTER — Ambulatory Visit
Admission: EM | Admit: 2024-03-18 | Discharge: 2024-03-18 | Disposition: A | Discharge status: Home / Self Care | Attending: Nurse Practitioner | Admitting: Nurse Practitioner

## 2024-03-18 DIAGNOSIS — B9689 Other specified bacterial agents as the cause of diseases classified elsewhere: Secondary | ICD-10-CM

## 2024-03-18 DIAGNOSIS — J019 Acute sinusitis, unspecified: Secondary | ICD-10-CM

## 2024-03-18 MED ORDER — FLUTICASONE PROPIONATE 50 MCG/ACT NA SUSP: 1.0000 | Freq: Every day | NASAL | 0 refills | Status: AC

## 2024-03-18 MED ORDER — DOXYCYCLINE HYCLATE 100 MG PO CAPS: 100.0000 mg | ORAL_CAPSULE | Freq: Two times a day (BID) | ORAL | 0 refills | Status: AC

## 2024-03-18 MED ORDER — BENZONATATE 100 MG PO CAPS: 100.0000 mg | ORAL_CAPSULE | Freq: Three times a day (TID) | ORAL | 0 refills | Status: AC | PRN

## 2024-03-18 NOTE — ED Provider Notes (Signed)
 [facility]    CSN: [id number] Arrival date & time: 03/18/24  1349      History   Chief Complaint No chief complaint on file.   HPI Kristin Bailey is a 63 y.o. female.   Patient presents today with 3-week history of bodyaches, congested and dry cough, chest pain from coughing so much, stuffy nose, sinus drainage and sinus pressure, headache, slight ear pain, and fatigue.  She denies known fevers, shortness of breath, sore throat, abdominal pain, nausea/vomiting, diarrhea, and change in appetite.  She was seen by her primary care provider for COVID-19, treated with Paxil bid and Augmentin  for sinus infection.  Was also treated with oral steroids.  Reports symptoms improved for a few days, but then significantly worsened.  Has been taking Mucinex and using saline nasal spray without much improvement.    Past Medical History:  Diagnosis Date   Anxiety    Arthritis    Asthma    Complication of anesthesia    Depression    Diabetes mellitus without complication (HCC)    Diverticulitis    Fibromyalgia    GERD (gastroesophageal reflux disease)    History of kidney stones    Hypertension    Hypothyroidism    IBS (irritable bowel syndrome)    Pneumonia    PONV (postoperative nausea and vomiting)    Sleep apnea    Thyroid  disease     Patient Active Problem List   Diagnosis Date Noted   Adenomatous polyp of ascending colon 07/13/2023   Irritable bowel syndrome with both constipation and diarrhea 06/21/2023   Non-alcoholic fatty liver disease 06/21/2023   Colon cancer screening 06/21/2023   Primary osteoarthritis of right knee 09/13/2021   OA (osteoarthritis) of knee 06/21/2021   S/P partial colectomy 04/22/2019   Sigmoid diverticulitis 04/22/2019   Elevated lactic acid level    Severe obesity (BMI >= 40) (HCC)    Gastroesophageal reflux disease    Sepsis without acute organ dysfunction (HCC) 03/02/2019   Syncope 03/02/2019   HTN (hypertension) 03/02/2019    MDD (major depressive disorder), recurrent episode, mild (HCC) 09/19/2018   Abdominal pain, left lower quadrant    Diverticulitis 09/26/2017   Hyponatremia 09/26/2017   Noninfectious gastroenteritis 11/28/2016   Enteritis 11/04/2016   Lactic acidosis 11/04/2016   Enterocolitis 03/17/2016   Gastroenteritis, infectious 03/17/2016   Leukocytosis 11/28/2015   Acute respiratory failure with hypoxia (HCC) 11/28/2015   Controlled type 2 diabetes mellitus without complication, without long-term current use of insulin  (HCC) 11/28/2015   Lower urinary tract infectious disease 11/27/2015   Colitis 11/27/2015   AKI (acute kidney injury) (HCC) 11/27/2015   Depression with anxiety     Past Surgical History:  Procedure Laterality Date   AGILE CAPSULE N/A 11/23/2016   Procedure: AGILE CAPSULE;  Surgeon: Ruby Corporal, MD;  Location: [facility];  Service: Endoscopy;  Laterality: N/A;  8:30   BREAST EXCISIONAL BIOPSY Right    CARPAL TUNNEL RELEASE     CESAREAN SECTION     CHOLECYSTECTOMY     COLONOSCOPY N/A 08/28/2014   Procedure: COLONOSCOPY;  Surgeon: Ruby Corporal, MD;  Location: [facility];  Service: Endoscopy;  Laterality: N/A;  1030   COLONOSCOPY WITH PROPOFOL  N/A 07/13/2023   Procedure: COLONOSCOPY WITH PROPOFOL ;  Surgeon: Hargis Lias, MD;  Location: [facility];  Service: Endoscopy;  Laterality: N/A;  11:30am;asa 3   CYST REMOVAL HAND     ESOPHAGOGASTRODUODENOSCOPY N/A 03/19/2016   Procedure:  ESOPHAGOGASTRODUODENOSCOPY (EGD);  Surgeon: Ruby Corporal, MD;  Location: [facility];  Service: Endoscopy;  Laterality: N/A;   FLEXIBLE SIGMOIDOSCOPY N/A 09/28/2017   Procedure: FLEXIBLE SIGMOIDOSCOPY;  Surgeon: Ruby Corporal, MD;  Location: [facility];  Service: Endoscopy;  Laterality: N/A;   FRACTURE SURGERY     rt ankle    GIVENS CAPSULE STUDY N/A 12/15/2016   Procedure: GIVENS CAPSULE STUDY;  Surgeon: Ruby Corporal, MD;  Location: [facility];  Service: Endoscopy;   Laterality: N/A;   KNEE SURGERY     meniscus repair   LAPAROSCOPIC PARTIAL COLECTOMY N/A 04/22/2019   Procedure: LAPAROSCOPIC PARTIAL SIGMOID COLECTOMY;  Surgeon: Awilda Bogus, MD;  Location: [facility];  Service: General;  Laterality: N/A;   POLYPECTOMY  07/13/2023   Procedure: POLYPECTOMY;  Surgeon: Hargis Lias, MD;  Location: [facility];  Service: Endoscopy;;   TOTAL KNEE ARTHROPLASTY Left 06/21/2021   Procedure: TOTAL KNEE ARTHROPLASTY;  Surgeon: Liliane Rei, MD;  Location: [facility];  Service: Orthopedics;  Laterality: Left;   TOTAL KNEE ARTHROPLASTY Right 09/13/2021   Procedure: TOTAL KNEE ARTHROPLASTY;  Surgeon: Liliane Rei, MD;  Location: [facility];  Service: Orthopedics;  Laterality: Right;   TUBAL LIGATION      OB History     Gravida  1   Para  1   Term  1   Preterm      AB      Living  1      SAB      IAB      Ectopic      Multiple      Live Births               Home Medications    Prior to Admission medications   Medication Sig Start Date End Date Taking? Authorizing Provider  benzonatate (TESSALON) 100 MG capsule Take 1 capsule (100 mg total) by mouth 3 (three) times daily as needed for cough. Do not take with alcohol or while operating or driving heavy machinery 11/19/08  Yes Thena Fireman A, NP  doxycycline (VIBRAMYCIN) 100 MG capsule Take 1 capsule (100 mg total) by mouth 2 (two) times daily for 7 days. 03/18/24 03/25/24 Yes Wilhemena Harbour, NP  fluticasone (FLONASE) 50 MCG/ACT nasal spray Place 1 spray into both nostrils daily. 03/18/24  Yes Thena Fireman A, NP  acetaminophen  (TYLENOL ) 650 MG CR tablet Take 1,300 mg by mouth. Takes 2 every morning    [provider]  ALPRAZolam  (XANAX ) 0.5 MG tablet Take 1 mg by mouth at bedtime. 02/03/21   [provider]  aspirin  EC 81 MG tablet Take 81 mg by mouth daily. Swallow whole.    [provider]  buPROPion (WELLBUTRIN XL) 150 MG 24 hr tablet Take 150 mg by mouth  daily.    [provider]  cetirizine (ZYRTEC) 10 MG tablet Take 10 mg by mouth daily.    [provider]  Cholecalciferol (VITAMIN D) 125 MCG (5000 UT) CAPS Take 5,000 Units by mouth daily.    [provider]  docusate sodium  (COLACE) 100 MG capsule Take 1 capsule (100 mg total) by mouth 2 (two) times daily. Patient taking differently: Take 300 mg by mouth at bedtime. 04/26/19   Awilda Bogus, MD  DULoxetine  (CYMBALTA ) 60 MG capsule Take 1 capsule (60 mg total) by mouth 2 (two) times daily. 03/13/19   Todd Fossa, MD  gabapentin  (NEURONTIN ) 300 MG capsule Take 300 mg by  mouth at bedtime. 01/17/21   [provider]  glipiZIDE  (GLUCOTROL  XL) 10 MG 24 hr tablet Take 10 mg by mouth daily. 12/09/20   [provider]  levothyroxine  (SYNTHROID , LEVOTHROID) 100 MCG tablet Take 100 mcg by mouth daily before breakfast.  08/16/17   [provider]  lisinopril -hydrochlorothiazide  (ZESTORETIC ) 20-12.5 MG tablet Take 0.5 tablets by mouth daily.    [provider]  Multiple Vitamin (MULTIVITAMIN WITH MINERALS) TABS tablet Take 1 tablet by mouth daily. Woman's 50+    [provider]  naphazoline-pheniramine (ALLERGY EYE) 0.025-0.3 % ophthalmic solution Place 1 drop into both eyes 4 (four) times daily as needed for eye irritation or allergies.    [provider]  omeprazole  (PRILOSEC  OTC) 20 MG tablet Take 1 tablet (20 mg total) by mouth daily. Patient taking differently: Take 20 mg by mouth See admin instructions. Every other night 01/03/18   Rehman, Mathews Solomons, MD  ondansetron  (ZOFRAN ) 4 MG tablet Take 1 tablet (4 mg total) by mouth every 6 (six) hours. 01/20/24   Early Glisson, MD  rosuvastatin (CRESTOR) 10 MG tablet Take 10 mg by mouth daily.    [provider]  Semaglutide (OZEMPIC, 0.25 OR 0.5 MG/DOSE, [place]) Inject 0.5 mg into the skin once a week.    [provider]  valACYclovir  (VALTREX ) 1000 MG tablet Take 2,000  mg by mouth See admin instructions. As needed for fever blisters, take 2 tablets twice a day for two doses.    [provider]    Family History Family History  Problem Relation Age of Onset   Cancer Mother        colon cancer age74   Hypertension Mother    Stroke Mother    Diabetes Mother    Non-Hodgkin's lymphoma Father    Hypertension Father    Diabetes Father    Stroke Maternal Grandmother    Kidney disease Paternal Aunt    Cancer Paternal Aunt    Kidney disease Paternal Uncle    Cancer Paternal Uncle     Social History Social History   Tobacco Use   Smoking status: Never   Smokeless tobacco: Never  Vaping Use   Vaping status: Never Used  Substance Use Topics   Alcohol use: No   Drug use: No     Allergies   Patient has no known allergies.   Review of Systems Review of Systems Per HPI  Physical Exam Triage Vital Signs ED Triage Vitals  Encounter Vitals Group     BP 03/18/24 1519 125/72     Systolic BP Percentile --      Diastolic BP Percentile --      Pulse Rate 03/18/24 1519 85     Resp 03/18/24 1519 20     Temp 03/18/24 1519 98.3 F (36.8 C)     Temp Source 03/18/24 1519 Oral     SpO2 03/18/24 1519 95 %     Weight --      Height --      Head Circumference --      Peak Flow --      Pain Score 03/18/24 1523 0     Pain Loc --      Pain Education --      Exclude from Growth Chart --    No data found.  Updated Vital Signs BP 125/72 (BP Location: Right Arm)   Pulse 85   Temp 98.3 F (36.8 C) (Oral)   Resp 20   LMP  03/27/2014 Comment: irregular  SpO2 95%   Visual Acuity Right Eye Distance:   Left Eye Distance:   Bilateral Distance:    Right Eye Near:   Left Eye Near:    Bilateral Near:     Physical Exam Vitals and nursing note reviewed.  Constitutional:      General: She is not in acute distress.    Appearance: Normal appearance. She is not ill-appearing or toxic-appearing.  HENT:     Head: Normocephalic and atraumatic.      Right Ear: Tympanic membrane, ear canal and external ear normal.     Left Ear: Tympanic membrane, ear canal and external ear normal.     Nose: No congestion or rhinorrhea.     Right Sinus: Maxillary sinus tenderness and frontal sinus tenderness present.     Left Sinus: Maxillary sinus tenderness and frontal sinus tenderness present.     Mouth/Throat:     Mouth: Mucous membranes are moist.     Pharynx: Oropharynx is clear. No oropharyngeal exudate or posterior oropharyngeal erythema.  Eyes:     General: No scleral icterus.    Extraocular Movements: Extraocular movements intact.  Cardiovascular:     Rate and Rhythm: Normal rate and regular rhythm.  Pulmonary:     Effort: Pulmonary effort is normal. No respiratory distress.     Breath sounds: Normal breath sounds. No wheezing, rhonchi or rales.  Musculoskeletal:     Cervical back: Normal range of motion and neck supple.  Lymphadenopathy:     Cervical: No cervical adenopathy.  Skin:    General: Skin is warm and dry.     Capillary Refill: Capillary refill takes less than 2 seconds.     Coloration: Skin is not jaundiced or pale.     Findings: No erythema or rash.  Neurological:     Mental Status: She is alert and oriented to person, place, and time.  Psychiatric:        Behavior: Behavior is cooperative.      UC Treatments / Results  Labs (all labs ordered are listed, but only abnormal results are displayed) Labs Reviewed - No data to display  EKG   Radiology No results found.  Procedures Procedures (including critical care time)  Medications Ordered in UC Medications - No data to display  Initial Impression / Assessment and Plan / UC Course  I have reviewed the triage vital signs and the nursing notes.  Pertinent labs & imaging results that were available during my care of the patient were reviewed by me and considered in my medical decision making (see chart for details).   Patient is well-appearing,  normotensive, afebrile, not tachycardic, not tachypneic, oxygenating well on room air.    1. Acute bacterial sinusitis Vitals and exam are reassuring today Given recent Augmentin  use, will treat with oral doxycycline twice daily for 7 days, continue saline spray, start Flonase and Tessalon Perles Other supportive care discussed ER and return precautions discussed with patient  The patient was given the opportunity to ask questions.  All questions answered to their satisfaction.  The patient is in agreement to this plan.   Final Clinical Impressions(s) / UC Diagnoses   Final diagnoses:  Acute bacterial sinusitis     Discharge Instructions      You have a sinus infection.  Take the doxycycline twice daily for 7 days as prescribed to treat it.  Continue guaifenesin 600 mg twice daily, saline nasal spray, and start Flonase nasal spray.  You can  also take the cough suppressant medication every 8 hours as needed to help suppress the dry cough.  Make sure you are drinking plenty of fluids.  Symptoms should improve over the next few days; please seek care if symptoms worsen despite treatment.     ED Prescriptions     Medication Sig Dispense Auth. Provider   doxycycline (VIBRAMYCIN) 100 MG capsule Take 1 capsule (100 mg total) by mouth 2 (two) times daily for 7 days. 14 capsule Thena Fireman A, NP   fluticasone (FLONASE) 50 MCG/ACT nasal spray Place 1 spray into both nostrils daily. 16 g Thena Fireman A, NP   benzonatate (TESSALON) 100 MG capsule Take 1 capsule (100 mg total) by mouth 3 (three) times daily as needed for cough. Do not take with alcohol or while operating or driving heavy machinery 21 capsule Wilhemena Harbour, NP      PDMP not reviewed this encounter.   Wilhemena Harbour, NP 03/18/24 [phone]

## 2024-03-18 NOTE — ED Triage Notes (Signed)
 Pt reports cough, sinus congestion, had COVID x 3 weeks ago. Was taking amoxicillin  and Mucinex, then treat with Augmentin  and steroids, helped some but has gotten worse post treatment.

## 2024-03-18 NOTE — Discharge Instructions (Addendum)
 You have a sinus infection.  Take the doxycycline twice daily for 7 days as prescribed to treat it.  Continue guaifenesin 600 mg twice daily, saline nasal spray, and start Flonase nasal spray.  You can also take the cough suppressant medication every 8 hours as needed to help suppress the dry cough.  Make sure you are drinking plenty of fluids.  Symptoms should improve over the next few days; please seek care if symptoms worsen despite treatment.
